# Patient Record
Sex: Female | Born: 1950
Health system: Southern US, Community
[De-identification: ages and names within clinical notes are randomized; demographics above are authoritative.]

## PROBLEM LIST (undated history)

## (undated) DIAGNOSIS — K219 Gastro-esophageal reflux disease without esophagitis: Secondary | ICD-10-CM

## (undated) DIAGNOSIS — E119 Type 2 diabetes mellitus without complications: Secondary | ICD-10-CM

## (undated) DIAGNOSIS — I10 Essential (primary) hypertension: Secondary | ICD-10-CM

## (undated) DIAGNOSIS — M109 Gout, unspecified: Secondary | ICD-10-CM

## (undated) DIAGNOSIS — K649 Unspecified hemorrhoids: Secondary | ICD-10-CM

## (undated) DIAGNOSIS — Z973 Presence of spectacles and contact lenses: Secondary | ICD-10-CM

## (undated) DIAGNOSIS — H9319 Tinnitus, unspecified ear: Secondary | ICD-10-CM

## (undated) DIAGNOSIS — I340 Nonrheumatic mitral (valve) insufficiency: Secondary | ICD-10-CM

## (undated) DIAGNOSIS — R32 Unspecified urinary incontinence: Secondary | ICD-10-CM

## (undated) DIAGNOSIS — I471 Supraventricular tachycardia, unspecified: Secondary | ICD-10-CM

## (undated) DIAGNOSIS — H538 Other visual disturbances: Secondary | ICD-10-CM

## (undated) DIAGNOSIS — J309 Allergic rhinitis, unspecified: Secondary | ICD-10-CM

## (undated) DIAGNOSIS — M1990 Unspecified osteoarthritis, unspecified site: Secondary | ICD-10-CM

## (undated) DIAGNOSIS — F419 Anxiety disorder, unspecified: Secondary | ICD-10-CM

## (undated) DIAGNOSIS — J45909 Unspecified asthma, uncomplicated: Secondary | ICD-10-CM

## (undated) DIAGNOSIS — K432 Incisional hernia without obstruction or gangrene: Secondary | ICD-10-CM

## (undated) DIAGNOSIS — IMO0001 Reserved for inherently not codable concepts without codable children: Secondary | ICD-10-CM

## (undated) DIAGNOSIS — E785 Hyperlipidemia, unspecified: Secondary | ICD-10-CM

## (undated) DIAGNOSIS — T7840XA Allergy, unspecified, initial encounter: Secondary | ICD-10-CM

## (undated) DIAGNOSIS — F32A Depression, unspecified: Secondary | ICD-10-CM

## (undated) DIAGNOSIS — R519 Headache, unspecified: Secondary | ICD-10-CM

## (undated) DIAGNOSIS — I219 Acute myocardial infarction, unspecified: Secondary | ICD-10-CM

## (undated) DIAGNOSIS — L511 Stevens-Johnson syndrome: Secondary | ICD-10-CM

## (undated) DIAGNOSIS — Z8709 Personal history of other diseases of the respiratory system: Secondary | ICD-10-CM

## (undated) DIAGNOSIS — F329 Major depressive disorder, single episode, unspecified: Secondary | ICD-10-CM

## (undated) DIAGNOSIS — R51 Headache: Secondary | ICD-10-CM

## (undated) DIAGNOSIS — R011 Cardiac murmur, unspecified: Secondary | ICD-10-CM

## (undated) DIAGNOSIS — D649 Anemia, unspecified: Secondary | ICD-10-CM

## (undated) DIAGNOSIS — E039 Hypothyroidism, unspecified: Secondary | ICD-10-CM

## (undated) DIAGNOSIS — R002 Palpitations: Secondary | ICD-10-CM

## (undated) DIAGNOSIS — N189 Chronic kidney disease, unspecified: Secondary | ICD-10-CM

## (undated) DIAGNOSIS — Z9289 Personal history of other medical treatment: Secondary | ICD-10-CM

## (undated) DIAGNOSIS — M25473 Effusion, unspecified ankle: Secondary | ICD-10-CM

## (undated) HISTORY — PX: HERNIA REPAIR: SHX51

## (undated) HISTORY — PX: COLON SURGERY: SHX602

## (undated) HISTORY — PX: ABDOMINAL HYSTERECTOMY: SHX81

## (undated) HISTORY — PX: COLONOSCOPY: SHX174

## (undated) HISTORY — PX: BOWEL RESECTION: SHX1257

## (undated) HISTORY — DX: Allergy, unspecified, initial encounter: T78.40XA

## (undated) HISTORY — PX: ESOPHAGOGASTRODUODENOSCOPY: SHX1529

## (undated) HISTORY — DX: Nonrheumatic mitral (valve) insufficiency: I34.0

---

## 1978-06-12 HISTORY — PX: HIATAL HERNIA REPAIR: SHX195

## 1996-06-12 HISTORY — PX: HEMORRHOID SURGERY: SHX153

## 2001-12-25 ENCOUNTER — Ambulatory Visit (HOSPITAL_COMMUNITY): Admission: RE | Admit: 2001-12-25 | Discharge: 2001-12-25 | Payer: Self-pay | Admitting: General Surgery

## 2007-11-07 ENCOUNTER — Encounter (INDEPENDENT_AMBULATORY_CARE_PROVIDER_SITE_OTHER): Payer: Self-pay | Admitting: Diagnostic Radiology

## 2007-11-07 ENCOUNTER — Encounter: Admission: RE | Admit: 2007-11-07 | Discharge: 2007-11-07 | Payer: Self-pay | Admitting: Obstetrics and Gynecology

## 2010-10-28 NOTE — Op Note (Signed)
[facility]  Patient:    [name], [name] Visit Number: [id number] MRN: [id number]          Service Type: END Location: DAY Attending Physician:  Jonathon Bellows Dictated by:   Tana Coast, P.A.-C. Proc. Date: 12/25/01 Admit Date:  12/25/2001 Discharge Date: 12/25/2001   CC:         Justice Britain, M.D.   Operative Report  DATE OF BIRTH:   [birth year]  PROCEDURE:  Esophageal Manometry.  REFERRING PHYSICIAN:  Justice Britain, M.D.  INDICATIONS:  Prior to antireflux surgery, studies per Dr. Catalina Gravel included EGD which revealed no abnormalities.  The patient had reflux symptoms which responded to PPI therapy.  METHOD:  Esophageal manometry study was performed with [employer] gastroesophageal upper gastrointestinal pyelogram, version 6.40, using an Arndorfer infusion system at 0.5 cc per minute.  The lower esophageal sphincter, (LES) was identified in four ports with the standard station pull-through technique.  For esophageal body pressures, the tip of the catheter was placed 3 cm above the proximal aspect of the ileus.  Ten wet swallows were taken with ports 5 cm apart in the esophageal body.  This procedure was reviewed with the patient prior to performing it.  FINDINGS:  Esophageal body contractions (with wet swallows) 0% peristaltic, remainder were simultaneous with 30% low amplitude.  Esophageal body revealed isovaleric contractions with no evidence of peristalsis.  There were low amplitude contractions with no peristalsis suggesting abnormal propagation.  Lower esophageal sphincter:  Locate at 21 to 38 cm (from nares).  Resting pressure 15 mmHg, relaxation 90%, adequate.  IMPRESSION: Based on manometric findings, would be concerned about the possibility of achalasia in resolution.  There was no evidence of peristalsis and were low amplitude contractions suggesting abnormal propagation.  Lower esophageal sphincter pressures  were normal, and the ileus appeared to relax completely, not typical of achalasia.  Given these findings, further workup is recommended prior to antireflux surgery.  Will arrange for the patient to have barium pill esophagogram, hopefully this afternoon.  RECOMMENDATIONS:  Further workup prior to proceeding with antireflux surgery given significant abnormalities seen on esophageal manometry study.  Will arrange for barium pill esophagogram hopefully this afternoon.  These findings were discussed and reviewed with Dr. Jena Gauss.   Findings and recommendations have been discussed with Dr. Gabriel Cirri by Dr. Jena Gauss. Dictated by:   Tana Coast, P.A.-C. Attending Physician:  Jonathon Bellows DD:  12/25/01 TD:  12/30/01 Job: [id number]

## 2014-12-25 ENCOUNTER — Encounter (HOSPITAL_COMMUNITY)
Admission: RE | Admit: 2014-12-25 | Discharge: 2014-12-25 | Disposition: A | Discharge status: Home / Self Care | Payer: BLUE CROSS/BLUE SHIELD | Source: Ambulatory Visit | Attending: Orthopaedic Surgery | Admitting: Orthopaedic Surgery

## 2014-12-25 ENCOUNTER — Encounter (HOSPITAL_COMMUNITY): Payer: Self-pay

## 2014-12-25 ENCOUNTER — Ambulatory Visit (HOSPITAL_COMMUNITY)
Admission: RE | Admit: 2014-12-25 | Discharge: 2014-12-25 | Disposition: A | Discharge status: Home / Self Care | Payer: BLUE CROSS/BLUE SHIELD | Source: Ambulatory Visit | Attending: Orthopedic Surgery | Admitting: Orthopedic Surgery

## 2014-12-25 DIAGNOSIS — M879 Osteonecrosis, unspecified: Secondary | ICD-10-CM | POA: Insufficient documentation

## 2014-12-25 DIAGNOSIS — Z01818 Encounter for other preprocedural examination: Secondary | ICD-10-CM | POA: Diagnosis not present

## 2014-12-25 DIAGNOSIS — Z01812 Encounter for preprocedural laboratory examination: Secondary | ICD-10-CM | POA: Diagnosis not present

## 2014-12-25 DIAGNOSIS — R9431 Abnormal electrocardiogram [ECG] [EKG]: Secondary | ICD-10-CM | POA: Diagnosis not present

## 2014-12-25 DIAGNOSIS — Z0183 Encounter for blood typing: Secondary | ICD-10-CM | POA: Diagnosis not present

## 2014-12-25 HISTORY — DX: Anemia, unspecified: D64.9

## 2014-12-25 HISTORY — DX: Gastro-esophageal reflux disease without esophagitis: K21.9

## 2014-12-25 HISTORY — DX: Anxiety disorder, unspecified: F41.9

## 2014-12-25 HISTORY — DX: Stevens-Johnson syndrome: L51.1

## 2014-12-25 HISTORY — DX: Gout, unspecified: M10.9

## 2014-12-25 HISTORY — DX: Depression, unspecified: F32.A

## 2014-12-25 HISTORY — DX: Reserved for inherently not codable concepts without codable children: IMO0001

## 2014-12-25 HISTORY — DX: Essential (primary) hypertension: I10

## 2014-12-25 HISTORY — DX: Hypothyroidism, unspecified: E03.9

## 2014-12-25 HISTORY — DX: Type 2 diabetes mellitus without complications: E11.9

## 2014-12-25 HISTORY — DX: Chronic kidney disease, unspecified: N18.9

## 2014-12-25 HISTORY — DX: Major depressive disorder, single episode, unspecified: F32.9

## 2014-12-25 HISTORY — DX: Unspecified osteoarthritis, unspecified site: M19.90

## 2014-12-25 LAB — TYPE AND SCREEN
ABO/RH(D): O POS
Antibody Screen: NEGATIVE

## 2014-12-25 LAB — CBC WITH DIFFERENTIAL/PLATELET
BASOS PCT: 0 % (ref 0–1)
Basophils Absolute: 0 10*3/uL (ref 0.0–0.1)
EOS ABS: 0.1 10*3/uL (ref 0.0–0.7)
EOS PCT: 3 % (ref 0–5)
HCT: 35.2 % — ABNORMAL LOW (ref 36.0–46.0)
Hemoglobin: 11.4 g/dL — ABNORMAL LOW (ref 12.0–15.0)
LYMPHS ABS: 1.8 10*3/uL (ref 0.7–4.0)
Lymphocytes Relative: 38 % (ref 12–46)
MCH: 26.5 pg (ref 26.0–34.0)
MCHC: 32.4 g/dL (ref 30.0–36.0)
MCV: 81.7 fL (ref 78.0–100.0)
MONO ABS: 0.4 10*3/uL (ref 0.1–1.0)
Monocytes Relative: 8 % (ref 3–12)
NEUTROS PCT: 51 % (ref 43–77)
Neutro Abs: 2.5 10*3/uL (ref 1.7–7.7)
Platelets: 201 10*3/uL (ref 150–400)
RBC: 4.31 MIL/uL (ref 3.87–5.11)
RDW: 13.8 % (ref 11.5–15.5)
WBC: 4.8 10*3/uL (ref 4.0–10.5)

## 2014-12-25 LAB — PROTIME-INR
INR: 1.03 (ref 0.00–1.49)
PROTHROMBIN TIME: 13.7 s (ref 11.6–15.2)

## 2014-12-25 LAB — SURGICAL PCR SCREEN
MRSA, PCR: NEGATIVE
Staphylococcus aureus: NEGATIVE

## 2014-12-25 LAB — ABO/RH: ABO/RH(D): O POS

## 2014-12-25 LAB — COMPREHENSIVE METABOLIC PANEL
ALT: 12 U/L — ABNORMAL LOW (ref 14–54)
ANION GAP: 8 (ref 5–15)
AST: 20 U/L (ref 15–41)
Albumin: 4.1 g/dL (ref 3.5–5.0)
Alkaline Phosphatase: 98 U/L (ref 38–126)
BILIRUBIN TOTAL: 0.5 mg/dL (ref 0.3–1.2)
BUN: 19 mg/dL (ref 6–20)
CALCIUM: 10.3 mg/dL (ref 8.9–10.3)
CHLORIDE: 107 mmol/L (ref 101–111)
CO2: 25 mmol/L (ref 22–32)
Creatinine, Ser: 1.07 mg/dL — ABNORMAL HIGH (ref 0.44–1.00)
GFR calc Af Amer: >60 mL/min (ref >60)
GFR calc non Af Amer: 54 mL/min — ABNORMAL LOW (ref >60)
Glucose, Bld: 107 mg/dL — ABNORMAL HIGH (ref 65–99)
Potassium: 4.6 mmol/L (ref 3.5–5.1)
SODIUM: 140 mmol/L (ref 135–145)
Total Protein: 7.7 g/dL (ref 6.5–8.1)

## 2014-12-25 LAB — GLUCOSE, CAPILLARY: Glucose-Capillary: 98 mg/dL (ref 65–99)

## 2014-12-25 LAB — APTT: APTT: 33 s (ref 24–37)

## 2014-12-25 NOTE — Pre-Procedure Instructions (Signed)
    Tyne Banta Mcewen  12/25/2014     No Pharmacies Listed   Your procedure is scheduled on 01/05/15.  Report to [facility] Admitting at 530 A.M.  Call this number if you have problems the morning of surgery:  [phone]   Remember:  Do not eat food or drink liquids after midnight.  Take these medicines the morning of surgery with A SIP OF WATER all inhalers,xanax,celaxa,clondine,hydrocodone,synthroid,metoprolol,singular,prilosec   Do not wear jewelry, make-up or nail polish.  Do not wear lotions, powders, or perfumes.  You may wear deodorant.  Do not shave 48 hours prior to surgery.  Men may shave face and neck.  Do not bring valuables to the hospital.  [facility] is not responsible for any belongings or valuables.  Contacts, dentures or bridgework may not be worn into surgery.  Leave your suitcase in the car.  After surgery it may be brought to your room.  For patients admitted to the hospital, discharge time will be determined by your treatment team.  Patients discharged the day of surgery will not be allowed to drive home.   Name and phone number of your driver:    Special instructions:    Please read over the following fact sheets that you were given. Pain Booklet, Coughing and Deep Breathing, Blood Transfusion Information, Total Joint Packet and MRSA Information

## 2014-12-25 NOTE — H&P (Signed)
CHIEF COMPLAINT:  Left hip and groin pain.     HISTORY OF PRESENT ILLNESS:  [name] is a 64 year old African American female who is seen today for evaluation of her left hip and groin pain.  She states that she has had these symptoms for some time and has now gotten to the point where she is having problems with her activities of daily living.  She does have pain with every step as well as nighttime pain.  She is having difficulty with ambulation.  She finds herself again having problems with weightbearing as well as problems with activities of daily living.  The nighttime pain is bothering her even more.  Comes in today for re-evaluation.     PAST MEDICAL HISTORY:  In general, her health is good.    HOSPITALIZATIONS:   1.  In 1986 for total abdominal hysterectomy.   2.  Small bowel resection for a small bowel obstruction, date unknown.  3.  1997 for a hemorrhoidectomy. 4.  Hiatal hernia repair, date unknown.   5.  She was also admitted in 2014 for anemia.     MEDICATIONS:   1.  Norco 10/325, 1 to 2 as needed for pain.   2.  Reglan 10 mg p.o. before meals and nightly.   3.  Pioglitazone 30 mg, 1 tablet daily.  4.  Alprazolam 0.5 mg t.i.d.  5.  Ventolin 2 puffs inhalation daily.  6.  Singulair 10 mg nightly.   7.  Levothyroxine 88 mcg daily.  8.  Fexofenadine 180 mg as needed.  9.  Citalopram 20 mg, 2 tablets daily.   10. Magnesium 400 mg daily.  11. Ipratropium bromide 0.07%, 2 applications to each nostril as needed daily b.i.d. 12. Omeprazole 40 mg daily.  13. Quinapril 40 mg daily.  14. Metoprolol tartrate 100 mg b.i.d. 15. Clonidine 0.2 mg, one-half to 1 tablet b.i.d. 16. Lasix 40 mg, 1 tablet daily.  17. Vitamin D 50,000 units orally twice weekly, which has been stopped.  18. Hydroxyzine 10 mg t.i.d. p.r.n. severe itching.    ALLERGIES:  Sulfa, which causes her itch and rash as well as breathing difficulties; penicillin causes her a rash; allopurinol also causes a rash.  She  states that Percocet causes her constipation with itching.  Vicodin is acceptable.  She does not use aspirin or NSAIDs because of acid reflux.    REVIEW OF SYSTEMS:  A 14-point review of systems reveals glasses for vision.  She does have occasional shortness of breath, especially with exertion.  She has had hypertension since the age of 45.  She does have palpitations noted.  Constipation has been noted also, but uses Reglan for this.  She does have a history of asthma and does use an inhaler and Singulair.  She is a diabetic and it has been more than 10 years.  She does have hypothyroidism since she was age 57.  She also has a history of anemia.  She did have what almost sounded like renal failure about 3 years ago based on medications, but she states that this has resolved.  She does have depression as well as nervous tension.     FAMILY HISTORY:  Positive for a mother who is deceased at age 61 who did have a bleeding dyscrasia.  Also hypertension and diabetes, but questionable diet, leukemia and renal disease.  Father died at age 5, but he had hypertension, but died from metastatic cancer.  Primary is unknown.  Brothers:  She has  one 73 year old, one 63 years old, both diabetic.  One 63 years old.  One 63 years old and is deceased.  She has one sister at age 70 who has hypertension and has a CVA noted.     SOCIAL HISTORY:  [name] is a 64 year old African American single female, retired from [employer].  She denies use of tobacco and alcohol.    PHYSICAL EXAMINATION:   A 64 year old [place] American female, well-developed, well-nourished, alert, cooperative, in moderate distress secondary to left hip and groin pain.  She is 5 feet, 2 inches, weighs 153 pounds with a BMI of 28.   Vital signs:  Temperature 97.7, pulse 72, respirations 16, blood pressure 156/90.  Head is normocephalic.  Eyes:  Pupils equal, round and reactive to light and accommodation with extraocular movements intact.  Ears,  nose and throat were benign.  Chest:  Good expansion. Lungs were essentially clear.  Cardiac:  Regular rhythm and rate with multiple ectopics.  No rubs or gallops are appreciated.  Pulses were 1+, bilateral and symmetric in the lower extremity.  Abdomen:  Obese, soft, nontender.  No masses palpable.  Normal bowel sounds present.   Genital/rectal and breast exam not indicated for an orthopedic evaluation.   CNS:  She appears oriented x3.  Cranial nerves II-XII grossly intact.   Musculoskeletal:  Today I can get her to about 90 to 95 degrees of forward flexion of the left hip.  She has minimal, if any, internal or external rotation, but this is painful with her lying on her back with her leg at 90 degrees of flexion with internal and external rotation.     RADIOGRAPHS:  AVN of the left hip as well as the right hip on a pelvis film.  The left hip, unfortunately though, is collapsed and at least 25 to 30% of the joint is collapsed secondary to the AVN.    CLINICAL IMPRESSION:   1.  AVN of the of the left and right hips, left worse than right, with collapse.  2.  Diabetes, non-insulin-dependent.  3.  Hypothyroidism.  4.  Hypertension.  5.  Palpitations.   6.  Depression.   7.  History of chronic kidney disease.  8.  Anemia, questionable iron-deficient.    RECOMMENDATIONS:   1.  At this time, I have reviewed a form from Dr. Melina Copa and Particia Nearing, PA-C stating that they feel that she would be a candidate for a left total hip arthroplasty.   2.  Therefore, our plan is to proceed with a left total hip arthroplasty.  Procedure, risks and benefits were fully explained to the patient using appropriate models.  All complications were discussed and all her questions were answered.  Therefore, we will proceed with the total hip replacement on the left pending the laboratory studies from the hospital preoperatively.    Mike Craze [place], [place] [phone]  12/28/2014 3:55  PM

## 2014-12-26 LAB — HEMOGLOBIN A1C
HEMOGLOBIN A1C: 6 % — AB (ref 4.8–5.6)
Mean Plasma Glucose: 126 mg/dL

## 2015-01-04 ENCOUNTER — Encounter (HOSPITAL_COMMUNITY): Payer: Self-pay | Admitting: Anesthesiology

## 2015-01-05 ENCOUNTER — Other Ambulatory Visit: Payer: Self-pay | Admitting: Cardiology

## 2015-01-05 ENCOUNTER — Encounter (HOSPITAL_COMMUNITY)
Admission: RE | Disposition: A | Discharge status: Home / Self Care | Payer: Self-pay | Source: Ambulatory Visit | Attending: Orthopaedic Surgery

## 2015-01-05 ENCOUNTER — Telehealth (HOSPITAL_COMMUNITY): Payer: Self-pay | Admitting: *Deleted

## 2015-01-05 ENCOUNTER — Ambulatory Visit (HOSPITAL_COMMUNITY)
Admission: RE | Admit: 2015-01-05 | Discharge: 2015-01-05 | DRG: 554 | Disposition: A | Discharge status: Home / Self Care | Payer: BLUE CROSS/BLUE SHIELD | Source: Ambulatory Visit | Attending: Orthopaedic Surgery | Admitting: Orthopaedic Surgery

## 2015-01-05 ENCOUNTER — Encounter (HOSPITAL_COMMUNITY): Payer: Self-pay | Admitting: *Deleted

## 2015-01-05 DIAGNOSIS — E119 Type 2 diabetes mellitus without complications: Secondary | ICD-10-CM

## 2015-01-05 DIAGNOSIS — K219 Gastro-esophageal reflux disease without esophagitis: Secondary | ICD-10-CM | POA: Diagnosis present

## 2015-01-05 DIAGNOSIS — I129 Hypertensive chronic kidney disease with stage 1 through stage 4 chronic kidney disease, or unspecified chronic kidney disease: Secondary | ICD-10-CM | POA: Diagnosis present

## 2015-01-05 DIAGNOSIS — I10 Essential (primary) hypertension: Secondary | ICD-10-CM

## 2015-01-05 DIAGNOSIS — R002 Palpitations: Secondary | ICD-10-CM | POA: Diagnosis not present

## 2015-01-05 DIAGNOSIS — E039 Hypothyroidism, unspecified: Secondary | ICD-10-CM | POA: Diagnosis present

## 2015-01-05 DIAGNOSIS — Z01810 Encounter for preprocedural cardiovascular examination: Secondary | ICD-10-CM

## 2015-01-05 DIAGNOSIS — M109 Gout, unspecified: Secondary | ICD-10-CM | POA: Diagnosis present

## 2015-01-05 DIAGNOSIS — M1990 Unspecified osteoarthritis, unspecified site: Secondary | ICD-10-CM | POA: Diagnosis present

## 2015-01-05 DIAGNOSIS — N189 Chronic kidney disease, unspecified: Secondary | ICD-10-CM | POA: Diagnosis present

## 2015-01-05 DIAGNOSIS — F419 Anxiety disorder, unspecified: Secondary | ICD-10-CM | POA: Diagnosis not present

## 2015-01-05 DIAGNOSIS — Z888 Allergy status to other drugs, medicaments and biological substances status: Secondary | ICD-10-CM

## 2015-01-05 DIAGNOSIS — J45909 Unspecified asthma, uncomplicated: Secondary | ICD-10-CM | POA: Diagnosis present

## 2015-01-05 DIAGNOSIS — R079 Chest pain, unspecified: Secondary | ICD-10-CM | POA: Diagnosis present

## 2015-01-05 DIAGNOSIS — F329 Major depressive disorder, single episode, unspecified: Secondary | ICD-10-CM | POA: Diagnosis not present

## 2015-01-05 DIAGNOSIS — Z880 Allergy status to penicillin: Secondary | ICD-10-CM

## 2015-01-05 DIAGNOSIS — M879 Osteonecrosis, unspecified: Secondary | ICD-10-CM | POA: Diagnosis not present

## 2015-01-05 DIAGNOSIS — D509 Iron deficiency anemia, unspecified: Secondary | ICD-10-CM | POA: Diagnosis not present

## 2015-01-05 DIAGNOSIS — Z882 Allergy status to sulfonamides status: Secondary | ICD-10-CM | POA: Diagnosis not present

## 2015-01-05 DIAGNOSIS — R0789 Other chest pain: Principal | ICD-10-CM

## 2015-01-05 DIAGNOSIS — Z79899 Other long term (current) drug therapy: Secondary | ICD-10-CM | POA: Diagnosis not present

## 2015-01-05 DIAGNOSIS — M25552 Pain in left hip: Secondary | ICD-10-CM | POA: Diagnosis present

## 2015-01-05 LAB — GLUCOSE, CAPILLARY: GLUCOSE-CAPILLARY: 115 mg/dL — AB (ref 65–99)

## 2015-01-05 SURGERY — ARTHROPLASTY, HIP, TOTAL,POSTERIOR APPROACH
Anesthesia: General | Site: Hip | Laterality: Left

## 2015-01-05 MED ORDER — CHLORHEXIDINE GLUCONATE 4 % EX LIQD: 60.0000 mL | Freq: Once | CUTANEOUS | Status: DC

## 2015-01-05 MED ORDER — PROPOFOL 10 MG/ML IV BOLUS
INTRAVENOUS | Status: AC
  Filled 2015-01-05: qty 20

## 2015-01-05 MED ORDER — ACETAMINOPHEN 10 MG/ML IV SOLN: 1000.0000 mg | Freq: Once | INTRAVENOUS | Status: DC

## 2015-01-05 MED ORDER — SODIUM CHLORIDE 0.9 % IV SOLN: INTRAVENOUS | Status: DC

## 2015-01-05 MED ORDER — FENTANYL CITRATE (PF) 250 MCG/5ML IJ SOLN
INTRAMUSCULAR | Status: AC
  Filled 2015-01-05: qty 5

## 2015-01-05 MED ORDER — CEFAZOLIN SODIUM-DEXTROSE 2-3 GM-% IV SOLR: 2.0000 g | INTRAVENOUS | Status: DC

## 2015-01-05 MED ORDER — CEFAZOLIN SODIUM-DEXTROSE 2-3 GM-% IV SOLR
INTRAVENOUS | Status: AC
  Filled 2015-01-05: qty 50

## 2015-01-05 MED ORDER — MIDAZOLAM HCL 2 MG/2ML IJ SOLN
INTRAMUSCULAR | Status: AC
  Filled 2015-01-05: qty 2

## 2015-01-05 NOTE — Anesthesia Preprocedure Evaluation (Deleted)
Anesthesia Evaluation    Reviewed: Allergy & Precautions, NPO status , Patient's Chart, lab work & pertinent test results  History of Anesthesia Complications Negative for: history of anesthetic complications  Airway        Dental   Pulmonary neg pulmonary ROS,          Cardiovascular hypertension,     Neuro/Psych PSYCHIATRIC DISORDERS Anxiety Depression negative neurological ROS     GI/Hepatic Neg liver ROS, GERD-  ,  Endo/Other  diabetesHypothyroidism   Renal/GU      Musculoskeletal   Abdominal   Peds  Hematology   Anesthesia Other Findings   Reproductive/Obstetrics                            Anesthesia Physical Anesthesia Plan  ASA: II  Anesthesia Plan:    Post-op Pain Management:    Induction:   Airway Management Planned:   Additional Equipment:   Intra-op Plan:   Post-operative Plan:   Informed Consent:   Plan Discussed with:   Anesthesia Plan Comments:         Anesthesia Quick Evaluation

## 2015-01-05 NOTE — Consult Note (Signed)
Cardiology Consultation     Patient ID: [name] [id number], [birth year] Admit date: 09/27/2014 Date of Consult: 09/28/2014  Primary Physician: Particia Nearing, [facility] Primary Cardiologist: new Referring Physician: Dr. Durward Fortes (ortho) and Dr. Tobias Alexander (anesthesia)  Chief Complaint: chest pain Reason for Consultation:  [place] cardiology consultation  HPI:   [name] is a 64 year old African-American female who admits to at least a 76 year history of hypertension, as well as a least a four-year history of type 2 diabetes mellitus.  She is seen in the preoperative suite prior to planned left hip surgery which was scheduled for today.  When she was evaluated by Dr. Tobias Alexander of anesthesiology, she reported a history of chest pain and ECG was felt to be abnormal.  Her elective surgery was canceled for today and Cardiology consultation was requested.  [name] has not been very active due to her orthopedic issues.  However, upon questioning, she admits to experiencing episodes of intermittent chest tightness associated with some shortness of breath.  At times these can occur any time, but she also has noticed it with attempted ambulation.  She admits that she has had some issues with anxiety.  She also has noticed palpitations intermittently.  She denies any episodes of presyncope or syncope.  She denies PND, orthopnea.  She has been on quinapril 40 mg daily, metoprolol tartrate 100 mg twice a day, clonidine 0.2 mg twice a day, and Lasix.  She has noticed intermittent ankle swelling.  She also has a history of asthma and has been on Singulair and Ventolin.  She states her last episode of chest pain was several days ago.  There was some question of experiencing mild chest discomfort earlier today.  Presently she is chest pain-free.   Past Medical History  Diagnosis Date  . Hypertension   . Shortness of breath dyspnea   . Gout   . Diabetes mellitus without complication   . Depression   . Anxiety    . Chronic Bright disease   . Hypothyroidism   . Anemia   . GERD (gastroesophageal reflux disease)   . Dysrhythmia   . Arthritis   . Stevens-Johnson disease    Past Surgical History  Procedure Laterality Date  . Abdominal hysterectomy    . Hernia repair    . Colon surgery      FAMHx:  Positive for a mother who is deceased at age 60 who did have a bleeding dyscrasia. Also hypertension and diabetes, leukemia and renal disease. Father died at age 46, but he had hypertension, but died from metastatic cancer. Brothers: She has one 41 year old, one 22 years old, both diabetic. One 63 years old. One 63 years old and is deceased. She has one sister at age 54 who has hypertension and CVA.    SOCHx:  African American single female, retired from [employer]. She denies use of tobacco and alcohol. Not able to exercise due to hip.    ALLERGIES: Allergies  Allergen Reactions  . Other Itching and Other (See Comments)    Acidic foods cause stomach upset and itching  . Allopurinol Rash  . Penicillins Rash  . Sulfa Antibiotics Rash     HOME MEDICATIONS: Prescriptions prior to admission  Medication Sig Dispense Refill Last Dose  . albuterol (PROVENTIL HFA;VENTOLIN HFA) 108 (90 BASE) MCG/ACT inhaler Inhale 2 puffs into the lungs 2 (two) times daily as needed for wheezing or shortness of breath.   01/05/2015 at 0345  . ALPRAZolam (XANAX) 0.5  MG tablet Take 0.5 mg by mouth 3 (three) times daily.  5 01/05/2015 at 0330  . Ascorbic Acid (VITAMIN C PO) Take 1 tablet by mouth daily.   01/04/2015 at Unknown time  . citalopram (CELEXA) 20 MG tablet Take 20 mg by mouth 2 (two) times daily.   12 01/05/2015 at 0330  . cloNIDine (CATAPRES) 0.2 MG tablet Take 0.1 mg by mouth at bedtime.   01/05/2015 at 0330  . fexofenadine (ALLEGRA) 180 MG tablet Take 180 mg by mouth daily as needed for allergies or rhinitis.   Past Week at Unknown time  . fluticasone (FLONASE ALLERGY RELIEF) 50 MCG/ACT  nasal spray Place 1 spray into both nostrils daily as needed for allergies or rhinitis.     . furosemide (LASIX) 40 MG tablet Take 40 mg by mouth daily.  12 Past Week at Unknown time  . HYDROcodone-acetaminophen (NORCO) 10-325 MG per tablet Take 1-2 tablets by mouth every 6 (six) hours as needed (pain).     . hydrOXYzine (ATARAX/VISTARIL) 10 MG tablet Take 10 mg by mouth every 6 (six) hours as needed (itching from hydrocodone/apap).   01/05/2015 at 0300  . levothyroxine (SYNTHROID, LEVOTHROID) 88 MCG tablet Take 88 mcg by mouth daily.  12 01/05/2015 at 0330  . Magnesium 400 MG TABS Take 400 mg by mouth daily.   Past Week at Unknown time  . metoCLOPramide (REGLAN) 10 MG tablet Take 10 mg by mouth 4 (four) times daily -  before meals and at bedtime.   Past Week at Unknown time  . metoprolol (LOPRESSOR) 100 MG tablet Take 100 mg by mouth 2 (two) times daily.  12 01/05/2015 at 0330  . montelukast (SINGULAIR) 10 MG tablet Take 10 mg by mouth at bedtime.  12 01/04/2015 at 0330  . Multiple Vitamin (MULTIVITAMIN WITH MINERALS) TABS tablet Take 1 tablet by mouth daily with lunch. Alive   01/04/2015 at Unknown time  . Omega-3 Fatty Acids (FISH OIL PO) Take 1 capsule by mouth daily.   Past Week at Unknown time  . omeprazole (PRILOSEC OTC) 20 MG tablet Take 20 mg by mouth 2 (two) times daily as needed (acid reflux/ heartburn).   01/05/2015 at 0245  . pioglitazone (ACTOS) 30 MG tablet Take 30 mg by mouth daily.  6 01/04/2015 at Unknown time  . Polyethyl Glycol-Propyl Glycol (SYSTANE OP) Place 1 drop into both eyes daily as needed (dry eyes).   Past Month at Unknown time  . POTASSIUM PO Take 1 tablet by mouth daily.   01/04/2015 at Unknown time  . quinapril (ACCUPRIL) 40 MG tablet Take 40 mg by mouth daily.  6 01/04/2015 at Unknown time  . Vitamin D, Ergocalciferol, (DRISDOL) 50000 UNITS CAPS capsule Take 50,000 Units by mouth 2 (two) times a week. Tuesday and Thursday  12 Past Week at Unknown time    HOSPITAL  MEDICATIONS: . acetaminophen  1,000 mg Intravenous Once  . ceFAZolin      .  ceFAZolin (ANCEF) IV  2 g Intravenous On Call to OR  . chlorhexidine  60 mL Topical Once  . chlorhexidine  60 mL Topical Once    ROS General: Negative; No fevers, chills, or night sweats;  HEENT: Negative; No changes in vision or hearing, sinus congestion, difficulty swallowing Pulmonary: Negative; No cough, wheezing, shortness of breath, hemoptysis Cardiovascular:  See HPI GI: positive for GERD GU: Negative; No dysuria, hematuria, or difficulty voiding Musculoskeletal:  Hematologic/Oncology: Positive for iron deficiency anemia Endocrine: Positive for diabetes and  hypothyroidism Neuro: Negative; no changes in balance, headaches Skin: Negative; No rashes or skin lesions Psychiatric: History of depression and anxiety Sleep: Negative; No snoring, daytime sleepiness, hypersomnolence, bruxism, restless legs, hypnogognic hallucinations, no cataplexy Other comprehensive 14 point system review is negative.  VITALS: Blood pressure 109/48, pulse 65, temperature 97.5 F (36.4 C), resp. rate 18, height 5' 2" (1.575 m), weight 161 lb (73.029 kg), SpO2 100 %.  PHYSICAL EXAM:  BP 109/48 mmHg  Pulse 65  Temp(Src) 97.5 F (36.4 C)  Resp 18  Ht 5' 2" (1.575 m)  Wt 161 lb (73.029 kg)  BMI 29.44 kg/m2  SpO2 100%   General: Alert, oriented, no distress.  Skin: normal turgor, no rashes, warm and dry HEENT: Normocephalic, atraumatic. Pupils equal round and reactive to light; sclera anicteric; extraocular muscles intact;  Nose without nasal septal hypertrophy Mouth/Parynx benign; Mallinpatti scale 3 Neck: No JVD, no carotid bruits; normal carotid upstroke Lungs: clear to ausculatation and percussion; no wheezing or rales Chest wall: without tenderness to palpitation Heart: PMI not displaced, RRR, s1 s2 normal, 1/6 systolic murmur, no diastolic murmur, no rubs, gallops, thrills, or heaves Abdomen: Midline abdominal  scar;soft, nontender; no hepatosplenomehaly, BS+; abdominal aorta nontender and not dilated by palpation. Back: no CVA tenderness Pulses 2+ Musculoskeletal: full range of motion, normal strength, no joint deformities Extremities: Trivial ankle edema, no clubbing cyanosis, Homan's sign negative  Neurologic: grossly nonfocal; Cranial nerves grossly wnl Psychologic: Mildly sedated   ECG (independently read by me):   12/25/2014: Sinus bradycardia 55 bpm.  Poor R wave progression V1 through V3, nondiagnostic T change in lead 3 and in lead V3  01/05/2015: Normal sinus rhythm at 60.  QRS complex V1 V2.  Nondiagnostic T change in lead 3.  LABS:  Results for orders placed or performed during the hospital encounter of 01/05/15 (from the past 48 hour(s))  Glucose, capillary     Status: Abnormal   Collection Time: 01/05/15  6:30 AM  Result Value Ref Range   Glucose-Capillary 115 (H) 65 - 99 mg/dL   BMET    Component Value Date/Time   NA 140 12/25/2014 1350   K 4.6 12/25/2014 1350   CL 107 12/25/2014 1350   CO2 25 12/25/2014 1350   GLUCOSE 107* 12/25/2014 1350   BUN 19 12/25/2014 1350   CREATININE 1.07* 12/25/2014 1350   CALCIUM 10.3 12/25/2014 1350   GFRNONAA 54* 12/25/2014 1350   GFRAA >60 12/25/2014 1350   Hepatic Function Latest Ref Rng 12/25/2014  Total Protein 6.5 - 8.1 g/dL 7.7  Albumin 3.5 - 5.0 g/dL 4.1  AST 15 - 41 U/L 20  ALT 14 - 54 U/L 12(L)  Alk Phosphatase 38 - 126 U/L 98  Total Bilirubin 0.3 - 1.2 mg/dL 0.5   CBC Latest Ref Rng 12/25/2014  WBC 4.0 - 10.5 K/uL 4.8  Hemoglobin 12.0 - 15.0 g/dL 11.4(L)  Hematocrit 36.0 - 46.0 % 35.2(L)  Platelets 150 - 400 K/uL 201   Lab Results  Component Value Date   MCV 81.7 12/25/2014   Lab Results  Component Value Date   HGBA1C 6.0* 12/25/2014   Lipid Panel  No results found for: CHOL, TRIG, HDL, CHOLHDL, VLDL, LDLCALC, LDLDIRECT   IMAGING: No results found.  IMPRESSION: [name] is a 64 year old  African-American female who has a cardiac risk factor profile notable for a long-standing history of hypertension, a history of type 2 diabetes mellitus, and family history for heart disease.  She has not been  able to be very active due to her orthopedic issues.  She has noticed episodes of chest pain which most time is nonexertional.  She describes this as a pressure and has gone away when she has taken her blood pressure medication.  She has noticed associated shortness of breath at times with this discomfort.  She also has a history of palpitations. Her blood pressure today is stable on her current medical regimen.  Her ECG  is not definitive but has shown poor anterior R-wave progression and nonspecific T changes.  With her cardiovascular comorbidities, I agree with canceling her elective surgery today and pursue initial noninvasive evaluation of her cardiovascular disease.  I will schedule her for a 2-D echo Doppler study as well as a Lexiscan nuclear perfusion stress test to assess both systolic and diastolic function, valvular architecture, as well as potential ischemic etiology to her chest discomfort.  If these studies are low risk and do not demonstrate significant ischemia she will be given clearance for her elective surgery.  However, if significant ischemia is demonstrated on her nuclear study further Cardiologic evaluation may be necessary prior to planned elective hip replacement surgery.  Outpatient laboratories should include a fasting lipid panel in this patient with diabetes mellitus with a target LDL less than 70.  We will also give her a prescription for sublingual nitroglycerin to see if this improves chest discomfort.   Attending:  Troy Sine, MD, [facility] 01/05/2015 8:47 AM

## 2015-01-05 NOTE — Progress Notes (Addendum)
Cardiology [phone])and informed of pt having chest pain and EKG changes.  Dr SInger saw pt and requested cardio eval, due to patient having chest pain. EKG completed.  Pt placed on tele and O2 applied.

## 2015-01-05 NOTE — Telephone Encounter (Signed)
Patient given detailed instructions per Myocardial Perfusion Study Information Sheet for test on 01/08/15 at 0945. Patient Notified to arrive 15 minutes early, and that it is imperative to arrive on time for appointment to keep from having the test rescheduled. Patient verbalized understanding. Ashleigh Arya, [place] [place]

## 2015-01-05 NOTE — Progress Notes (Signed)
Dr Claiborne Billings in to see patient states that it will be ok to removed her IV. Iv removed and PT instructed to call when she is dressed.

## 2015-01-05 NOTE — Progress Notes (Signed)
Trish called and informed of pt having chest pain and EKG changes. States she will get someone over.

## 2015-01-08 ENCOUNTER — Other Ambulatory Visit: Payer: Self-pay

## 2015-01-08 ENCOUNTER — Ambulatory Visit (HOSPITAL_BASED_OUTPATIENT_CLINIC_OR_DEPARTMENT_OTHER): Payer: BLUE CROSS/BLUE SHIELD

## 2015-01-08 ENCOUNTER — Ambulatory Visit (HOSPITAL_COMMUNITY): Payer: BLUE CROSS/BLUE SHIELD | Attending: Cardiology

## 2015-01-08 VITALS — Ht 62.0 in | Wt 161.0 lb

## 2015-01-08 DIAGNOSIS — I340 Nonrheumatic mitral (valve) insufficiency: Secondary | ICD-10-CM | POA: Diagnosis not present

## 2015-01-08 DIAGNOSIS — R42 Dizziness and giddiness: Secondary | ICD-10-CM | POA: Diagnosis not present

## 2015-01-08 DIAGNOSIS — R600 Localized edema: Secondary | ICD-10-CM | POA: Insufficient documentation

## 2015-01-08 DIAGNOSIS — R110 Nausea: Secondary | ICD-10-CM

## 2015-01-08 DIAGNOSIS — R0789 Other chest pain: Secondary | ICD-10-CM | POA: Insufficient documentation

## 2015-01-08 DIAGNOSIS — E119 Type 2 diabetes mellitus without complications: Secondary | ICD-10-CM | POA: Insufficient documentation

## 2015-01-08 DIAGNOSIS — R079 Chest pain, unspecified: Secondary | ICD-10-CM

## 2015-01-08 DIAGNOSIS — I10 Essential (primary) hypertension: Secondary | ICD-10-CM | POA: Diagnosis not present

## 2015-01-08 DIAGNOSIS — R0609 Other forms of dyspnea: Secondary | ICD-10-CM | POA: Insufficient documentation

## 2015-01-08 LAB — MYOCARDIAL PERFUSION IMAGING
LV dias vol: 76 mL
LV sys vol: 20 mL
Peak HR: 101 {beats}/min
RATE: 0.3
Rest HR: 57 {beats}/min
SDS: 2
SRS: 0
SSS: 2
TID: 0.94

## 2015-01-08 MED ORDER — REGADENOSON 0.4 MG/5ML IV SOLN
0.4000 mg | Freq: Once | INTRAVENOUS | Status: AC
  Administered 2015-01-08: 0.4 mg via INTRAVENOUS

## 2015-01-08 MED ORDER — TECHNETIUM TC 99M SESTAMIBI GENERIC - CARDIOLITE
30.3000 | Freq: Once | INTRAVENOUS | Status: AC | PRN
  Administered 2015-01-08: 30.3 via INTRAVENOUS

## 2015-01-08 MED ORDER — TECHNETIUM TC 99M SESTAMIBI GENERIC - CARDIOLITE
10.3000 | Freq: Once | INTRAVENOUS | Status: AC | PRN
  Administered 2015-01-08: 10 via INTRAVENOUS

## 2015-01-08 MED ORDER — AMINOPHYLLINE 25 MG/ML IV SOLN
75.0000 mg | Freq: Once | INTRAVENOUS | Status: AC
Start: 2015-01-08 — End: 2015-01-08
  Administered 2015-01-08: 75 mg via INTRAVENOUS

## 2015-01-13 ENCOUNTER — Ambulatory Visit (INDEPENDENT_AMBULATORY_CARE_PROVIDER_SITE_OTHER): Payer: BLUE CROSS/BLUE SHIELD | Admitting: Nurse Practitioner

## 2015-01-13 ENCOUNTER — Encounter: Payer: Self-pay | Admitting: Nurse Practitioner

## 2015-01-13 VITALS — BP 150/80 | HR 60 | Ht 63.0 in | Wt 170.0 lb

## 2015-01-13 DIAGNOSIS — R079 Chest pain, unspecified: Secondary | ICD-10-CM | POA: Insufficient documentation

## 2015-01-13 DIAGNOSIS — I340 Nonrheumatic mitral (valve) insufficiency: Secondary | ICD-10-CM | POA: Diagnosis not present

## 2015-01-13 DIAGNOSIS — I10 Essential (primary) hypertension: Secondary | ICD-10-CM | POA: Diagnosis not present

## 2015-01-13 DIAGNOSIS — E119 Type 2 diabetes mellitus without complications: Secondary | ICD-10-CM | POA: Insufficient documentation

## 2015-01-13 DIAGNOSIS — E1165 Type 2 diabetes mellitus with hyperglycemia: Secondary | ICD-10-CM | POA: Insufficient documentation

## 2015-01-13 NOTE — Patient Instructions (Signed)
Medication Instructions:  Your physician recommends that you continue on your current medications as directed. Please refer to the Current Medication list given to you today.   Labwork: None   Testing/Procedures: None   Follow-Up: Your physician wants you to follow-up in: 6 months with Dr.Kelly You will receive a reminder letter in the mail two months in advance. If you don't receive a letter, please call our office to schedule the follow-up appointment.   Any Other Special Instructions Will Be Listed Below (If Applicable). You have been cleared for your upcoming surgery with Dr.Whitfield

## 2015-01-13 NOTE — Progress Notes (Signed)
Patient Name: [name] Date of Encounter: 01/13/2015  Primary Care Provider:  Terald Sleeper, PA-C Primary Cardiologist:  Corky Downs, MD   Chief Complaint  64 year old female without prior cardiac history who presents for follow-up for chest pain after recent testing.  Past Medical History   Past Medical History  Diagnosis Date  . Hypertension   . Shortness of breath dyspnea   . Gout   . Diabetes mellitus without complication   . Depression   . Anxiety   . Chronic kidney disease   . Hypothyroidism   . Anemia   . GERD (gastroesophageal reflux disease)   . Arthritis   . Stevens-Johnson disease   . Exertional chest pain     a. 12/2014 Lexi MV: EF 73%, no ischemia.  . Moderate mitral insufficiency     a. 12/2014 Echo: EF 55-60%, mod MR, mildly to moderately dil LA, PASP 17mmHg.   Past Surgical History  Procedure Laterality Date  . Abdominal hysterectomy    . Hernia repair    . Colon surgery      Allergies  Allergies  Allergen Reactions  . Other Itching and Other (See Comments)    Acidic foods cause stomach upset and itching  . Allopurinol Rash  . Penicillins Rash  . Sulfa Antibiotics Rash    HPI  64 year old female without a prior cardiac history. She does have a history of hypertension. She was recently evaluated by orthopedics secondary to bilateral hip pain and diagnosed with avascular necrosis which is worse on the right. She was scheduled for total hip arthroplasty in late June but reported chest pain and dyspnea on exertion and the case was canceled and cardiology was consulted. She was seen by Dr. Claiborne Billings with recommendation for canceling the procedure and outpatient echo and stress testing. These have since been performed on July 29. Echo showed normal LV function with moderate mitral regurgitation. Stress testing was negative for ischemia with normal LV function. She has not had significant chest pain or dyspnea since cancellation of her procedure. She does  have a fairly long history however, of exertional substernal chest pressure and dyspnea at higher levels of activity. Her activity is predominantly limited at this point secondary to bilateral hip pain. She denies any history of PND, orthopnea, dizziness, syncope. She does have intermittent lower extremity edema which she believes is related to sitting most of the day.  Home Medications  Prior to Admission medications   Medication Sig Start Date End Date Taking? Authorizing Provider  albuterol (PROVENTIL HFA;VENTOLIN HFA) 108 (90 BASE) MCG/ACT inhaler Inhale 2 puffs into the lungs 2 (two) times daily as needed for wheezing or shortness of breath.   Yes Historical Provider, MD  ALPRAZolam Duanne Moron) 0.5 MG tablet Take 0.5 mg by mouth 3 (three) times daily. 12/01/14  Yes Historical Provider, MD  Ascorbic Acid (VITAMIN C PO) Take 1 tablet by mouth daily.   Yes Historical Provider, MD  aspirin 81 MG tablet Take 81 mg by mouth daily.   Yes Historical Provider, MD  citalopram (CELEXA) 20 MG tablet Take 20 mg by mouth 2 (two) times daily.  12/01/14  Yes Historical Provider, MD  cloNIDine (CATAPRES) 0.2 MG tablet Take 0.2 mg by mouth. Take 1/2 tablet twice daily   Yes Historical Provider, MD  fexofenadine (ALLEGRA) 180 MG tablet Take 180 mg by mouth daily as needed for allergies or rhinitis.   Yes Historical Provider, MD  furosemide (LASIX) 40 MG tablet Take 40 mg by  mouth daily. 10/15/14  Yes Historical Provider, MD  HYDROcodone-acetaminophen (NORCO) 10-325 MG per tablet Take 1-2 tablets by mouth every 6 (six) hours as needed (pain).   Yes Historical Provider, MD  hydrOXYzine (ATARAX/VISTARIL) 10 MG tablet Take 10 mg by mouth every 6 (six) hours as needed (itching from hydrocodone/apap).   Yes Historical Provider, MD  levothyroxine (SYNTHROID, LEVOTHROID) 88 MCG tablet Take 88 mcg by mouth daily. 12/10/14  Yes Historical Provider, MD  Magnesium 400 MG TABS Take 400 mg by mouth daily.   Yes Historical Provider, MD    metoCLOPramide (REGLAN) 10 MG tablet Take 10 mg by mouth 4 (four) times daily -  before meals and at bedtime.   Yes Historical Provider, MD  metoprolol (LOPRESSOR) 100 MG tablet Take 100 mg by mouth 2 (two) times daily. 12/10/14  Yes Historical Provider, MD  montelukast (SINGULAIR) 10 MG tablet Take 10 mg by mouth at bedtime. 12/01/14  Yes Historical Provider, MD  Multiple Vitamin (MULTIVITAMIN WITH MINERALS) TABS tablet Take 1 tablet by mouth daily with lunch. Alive   Yes Historical Provider, MD  Omega-3 Fatty Acids (FISH OIL PO) Take 1 capsule by mouth daily.   Yes Historical Provider, MD  omeprazole (PRILOSEC OTC) 20 MG tablet Take 20 mg by mouth 2 (two) times daily as needed (acid reflux/ heartburn).   Yes Historical Provider, MD  pioglitazone (ACTOS) 30 MG tablet Take 30 mg by mouth daily. 12/01/14  Yes Historical Provider, MD  Polyethyl Glycol-Propyl Glycol (SYSTANE OP) Place 1 drop into both eyes daily as needed (dry eyes).   Yes Historical Provider, MD  POTASSIUM PO Take 1 tablet by mouth daily.   Yes Historical Provider, MD  quinapril (ACCUPRIL) 40 MG tablet Take 40 mg by mouth daily. 11/30/14  Yes Historical Provider, MD  Vitamin D, Ergocalciferol, (DRISDOL) 50000 UNITS CAPS capsule Take 50,000 Units by mouth 2 (two) times a week. Tuesday and Thursday 12/10/14  Yes Historical Provider, MD    Review of Systems  As above, she does experience dyspnea on exertion and chest pressure with higher levels of activity. She denies PND, orthopnea, dizziness, CB, edema, or early satiety.  She does have bilateral hip pain which limits her activities.  All other systems reviewed and are otherwise negative except as noted above.  Physical Exam  VS:  BP 150/80 mmHg  Pulse 60  Ht 5\' 3"  (1.6 m)  Wt 170 lb (77.111 kg)  BMI 30.12 kg/m2 , BMI Body mass index is 30.12 kg/(m^2). GEN: Well nourished, well developed, in no acute distress. HEENT: normal. Neck: Supple, no JVD, carotid bruits, or  masses. Cardiac: RRR, no murmurs, rubs, or gallops. No clubbing, cyanosis, edema.  Radials/DP/PT 2+ and equal bilaterally.  Respiratory:  Respirations regular and unlabored, clear to auscultation bilaterally. GI: Soft, nontender, nondistended, BS + x 4. MS: no deformity or atrophy. Skin: warm and dry, no rash. Neuro:  Strength and sensation are intact. Psych: Normal affect.  Accessory Clinical Findings  ECG - ECG from July 26 shows sinus bradycardia, 59, nonspecific T changes.  Assessment & Plan  1.  Exertional chest discomfort and dyspnea exertion: Patient was recently scheduled for right total hip arthroplasty this was canceled secondary to symptoms of exertional chest discomfort and dyspnea on higher levels of exertion. She has now undergone stress testing and echocardiogram which have shown normal LV function without evidence of ischemia. She does have moderate mitral regurgitation by echo. I have discussed her case with Dr. Caryl Comes. We recommend continuation  of beta blocker therapy throughout the perioperative period and close monitoring of intake and output postoperatively to avoid volume overload. She may proceed to the OR without further evaluation at this time.  2. Moderate mitral regurgitation: This was noted on echocardiogram recently. This may be playing a role in her exertional dyspnea, though LV function is normal. Volume status is stable today. She will need to have close monitoring of I's and O's throughout perioperative period along with continuation of beta blocker therapy.  Follow-up echo in 1 year.  3. Essential hypertension: Blood pressure is elevated today. I recommended that she follow her blood pressure at home and notify us if he continues to run high. She is on beta blocker, clonidine, and diaphoretic therapy.  4. Disposition: Follow-up with Dr. Claiborne Billings in 6 months or sooner if necessary.  Murray Hodgkins, NP 01/13/2015, 5:10 PM

## 2015-01-18 NOTE — H&P (Addendum)
CHIEF COMPLAINT:  Left hip and groin pain.     HISTORY OF PRESENT ILLNESS:  [name] is a 64 year old African American female who is seen today for evaluation of her left hip and groin pain.  She states that she has had these symptoms for some time and has now gotten to the point where she is having problems with her activities of daily living.  She does have pain with every step as well as nighttime pain.  She is having difficulty with ambulation.  She finds herself again having problems with weightbearing as well as problems with activities of daily living.  The nighttime pain is bothering her even more.  Comes in today for re-evaluation.     PAST MEDICAL HISTORY:  In general, her health is good.    HOSPITALIZATIONS:   1.  In 1986 for total abdominal hysterectomy.   2.  Small bowel resection for a small bowel obstruction, date unknown.  3.  1997 for a hemorrhoidectomy. 4.  Hiatal hernia repair, date unknown.   5.  She was also admitted in 2014 for anemia.     MEDICATIONS:   1.  Norco 10/325, 1 to 2 as needed for pain.   2.  Reglan 10 mg p.o. before meals and nightly.   3.  Pioglitazone 30 mg, 1 tablet daily.  4.  Alprazolam 0.5 mg t.i.d.  5.  Ventolin 2 puffs inhalation daily.  6.  Singulair 10 mg nightly.   7.  Levothyroxine 88 mcg daily.  8.  Fexofenadine 180 mg as needed.  9.  Citalopram 20 mg, 2 tablets daily.   10. Magnesium 400 mg daily.  11. Ipratropium bromide 2.83%, 2 applications to each nostril as needed daily b.i.d. 12. Omeprazole 40 mg daily.  13. Quinapril 40 mg daily.  14. Metoprolol tartrate 100 mg b.i.d. 15. Clonidine 0.2 mg, one-half to 1 tablet b.i.d. 16. Lasix 40 mg, 1 tablet daily.  17. Vitamin D 50,000 units orally twice weekly, which has been stopped.  18. Hydroxyzine 10 mg t.i.d. p.r.n. severe itching.    ALLERGIES:  Sulfa, which causes her itch and rash as well as breathing difficulties; penicillin causes her a rash; allopurinol also causes a rash.  She  states that Percocet causes her constipation with itching.  Vicodin is acceptable.  She does not use aspirin or NSAIDs because of acid reflux.    REVIEW OF SYSTEMS:  A 14-point review of systems reveals glasses for vision.  She does have occasional shortness of breath, especially with exertion.  She has had hypertension since the age of 49.  She does have palpitations noted.  Constipation has been noted also, but uses Reglan for this.  She does have a history of asthma and does use an inhaler and Singulair.  She is a diabetic and it has been more than 10 years.  She does have hypothyroidism since she was age 36.  She also has a history of anemia.  She did have what almost sounded like renal failure about 3 years ago based on medications, but she states that this has resolved.  She does have depression as well as nervous tension.     FAMILY HISTORY:  Positive for a mother who is deceased at age 41 who did have a bleeding dyscrasia.  Also hypertension and diabetes, but questionable diet, leukemia and renal disease.  Father died at age 24, but he had hypertension, but died from metastatic cancer.  Primary is unknown.  Brothers:  She has  one 81 year old, one 63 years old, both diabetic.  One 63 years old.  One 63 years old and is deceased.  She has one sister at age 27 who has hypertension and has a CVA noted.     SOCIAL HISTORY:  [name] is a 64 year old African American single female, retired from [employer].  She denies use of tobacco and alcohol.    PHYSICAL EXAMINATION:   A 64 year old [place] American female, well-developed, well-nourished, alert, cooperative, in moderate distress secondary to left hip and groin pain.  She is 5 feet, 2 inches, weighs 153 pounds with a BMI of 28.   Vital signs:  Temperature 97.7, pulse 72, respirations 16, blood pressure 156/90.  Head is normocephalic.  Eyes:  Pupils equal, round and reactive to light and accommodation with extraocular movements intact.  Ears,  nose and throat were benign.  Chest:  Good expansion. Lungs were essentially clear.  Cardiac:  Regular rhythm and rate with multiple ectopics.  No rubs or gallops are appreciated.  Pulses were 1+, bilateral and symmetric in the lower extremity.  Abdomen:  Obese, soft, nontender.  No masses palpable.  Normal bowel sounds present.   Genital/rectal and breast exam not indicated for an orthopedic evaluation.   CNS:  She appears oriented x3.  Cranial nerves II-XII grossly intact.   Musculoskeletal:  Today I can get her to about 90 to 95 degrees of forward flexion of the left hip.  She has minimal, if any, internal or external rotation, but this is painful with her lying on her back with her leg at 90 degrees of flexion with internal and external rotation.     RADIOGRAPHS:  AVN of the left hip as well as the right hip on a pelvis film.  The left hip, unfortunately though, is collapsed and at least 25 to 30% of the joint is collapsed secondary to the AVN.    CLINICAL IMPRESSION:   1.  AVN of the of the left and right hips, left worse than right, with collapse.  2.  Diabetes, non-insulin-dependent.  3.  Hypothyroidism.  4.  Hypertension.  5.  Palpitations.   6.  Depression.   7.  History of chronic kidney disease.  8.  Anemia, questionable iron-deficient.    RECOMMENDATIONS:   1.  At this time, I have reviewed a form from Dr. Melina Copa and Particia Nearing, PA-C stating that they feel that she would be a candidate for a left total hip arthroplasty.   2.  Therefore, our plan is to proceed with a left total hip arthroplasty.  Procedure, risks and benefits were fully explained to the patient using appropriate models.  All complications were discussed and all her questions were answered.  Therefore, we will proceed with the total hip replacement on the left pending the laboratory studies from the hospital preoperatively.   3.  Had chest pain prior to attempt at [facility].  Case was cancelled.  Cardiology cleared  patient.  Will proceed with THR  Mike Craze. [place], [place] [phone]  01/28/2015 12:33 PM

## 2015-01-21 ENCOUNTER — Encounter (HOSPITAL_COMMUNITY): Payer: Self-pay

## 2015-01-21 ENCOUNTER — Inpatient Hospital Stay (HOSPITAL_COMMUNITY): Admission: RE | Admit: 2015-01-21 | Payer: BLUE CROSS/BLUE SHIELD | Source: Ambulatory Visit

## 2015-01-21 ENCOUNTER — Other Ambulatory Visit (HOSPITAL_COMMUNITY): Payer: BLUE CROSS/BLUE SHIELD

## 2015-01-21 ENCOUNTER — Encounter (HOSPITAL_COMMUNITY)
Admission: RE | Admit: 2015-01-21 | Discharge: 2015-01-21 | Disposition: A | Discharge status: Home / Self Care | Payer: BLUE CROSS/BLUE SHIELD | Source: Ambulatory Visit | Attending: Orthopaedic Surgery | Admitting: Orthopaedic Surgery

## 2015-01-21 DIAGNOSIS — Z0183 Encounter for blood typing: Secondary | ICD-10-CM | POA: Insufficient documentation

## 2015-01-21 DIAGNOSIS — K219 Gastro-esophageal reflux disease without esophagitis: Secondary | ICD-10-CM | POA: Insufficient documentation

## 2015-01-21 DIAGNOSIS — E039 Hypothyroidism, unspecified: Secondary | ICD-10-CM | POA: Diagnosis not present

## 2015-01-21 DIAGNOSIS — I129 Hypertensive chronic kidney disease with stage 1 through stage 4 chronic kidney disease, or unspecified chronic kidney disease: Secondary | ICD-10-CM | POA: Diagnosis not present

## 2015-01-21 DIAGNOSIS — N189 Chronic kidney disease, unspecified: Secondary | ICD-10-CM | POA: Diagnosis not present

## 2015-01-21 DIAGNOSIS — Z01818 Encounter for other preprocedural examination: Secondary | ICD-10-CM | POA: Diagnosis not present

## 2015-01-21 DIAGNOSIS — E1122 Type 2 diabetes mellitus with diabetic chronic kidney disease: Secondary | ICD-10-CM | POA: Insufficient documentation

## 2015-01-21 DIAGNOSIS — Z79899 Other long term (current) drug therapy: Secondary | ICD-10-CM | POA: Diagnosis not present

## 2015-01-21 DIAGNOSIS — Z01812 Encounter for preprocedural laboratory examination: Secondary | ICD-10-CM | POA: Diagnosis not present

## 2015-01-21 DIAGNOSIS — Z7982 Long term (current) use of aspirin: Secondary | ICD-10-CM | POA: Insufficient documentation

## 2015-01-21 DIAGNOSIS — I340 Nonrheumatic mitral (valve) insufficiency: Secondary | ICD-10-CM | POA: Insufficient documentation

## 2015-01-21 HISTORY — DX: Personal history of other medical treatment: Z92.89

## 2015-01-21 LAB — PROTIME-INR
INR: 1.02 (ref 0.00–1.49)
Prothrombin Time: 13.6 seconds (ref 11.6–15.2)

## 2015-01-21 LAB — CBC WITH DIFFERENTIAL/PLATELET
Basophils Absolute: 0 10*3/uL (ref 0.0–0.1)
Basophils Relative: 0 % (ref 0–1)
EOS PCT: 2 % (ref 0–5)
Eosinophils Absolute: 0.1 10*3/uL (ref 0.0–0.7)
HEMATOCRIT: 37.1 % (ref 36.0–46.0)
HEMOGLOBIN: 11.9 g/dL — AB (ref 12.0–15.0)
LYMPHS ABS: 1.6 10*3/uL (ref 0.7–4.0)
LYMPHS PCT: 32 % (ref 12–46)
MCH: 26.8 pg (ref 26.0–34.0)
MCHC: 32.1 g/dL (ref 30.0–36.0)
MCV: 83.6 fL (ref 78.0–100.0)
MONO ABS: 0.3 10*3/uL (ref 0.1–1.0)
MONOS PCT: 7 % (ref 3–12)
Neutro Abs: 3 10*3/uL (ref 1.7–7.7)
Neutrophils Relative %: 59 % (ref 43–77)
Platelets: 203 10*3/uL (ref 150–400)
RBC: 4.44 MIL/uL (ref 3.87–5.11)
RDW: 14 % (ref 11.5–15.5)
WBC: 5.1 10*3/uL (ref 4.0–10.5)

## 2015-01-21 LAB — COMPREHENSIVE METABOLIC PANEL
ALBUMIN: 4.1 g/dL (ref 3.5–5.0)
ALK PHOS: 117 U/L (ref 38–126)
ALT: 11 U/L — ABNORMAL LOW (ref 14–54)
AST: 25 U/L (ref 15–41)
Anion gap: 9 (ref 5–15)
BILIRUBIN TOTAL: 0.7 mg/dL (ref 0.3–1.2)
BUN: 23 mg/dL — AB (ref 6–20)
CHLORIDE: 105 mmol/L (ref 101–111)
CO2: 26 mmol/L (ref 22–32)
Calcium: 10.2 mg/dL (ref 8.9–10.3)
Creatinine, Ser: 1.12 mg/dL — ABNORMAL HIGH (ref 0.44–1.00)
GFR calc Af Amer: 60 mL/min — ABNORMAL LOW (ref >60)
GFR calc non Af Amer: 52 mL/min — ABNORMAL LOW (ref >60)
Glucose, Bld: 109 mg/dL — ABNORMAL HIGH (ref 65–99)
POTASSIUM: 4.3 mmol/L (ref 3.5–5.1)
Sodium: 140 mmol/L (ref 135–145)
Total Protein: 7.7 g/dL (ref 6.5–8.1)

## 2015-01-21 LAB — URINALYSIS, ROUTINE W REFLEX MICROSCOPIC
BILIRUBIN URINE: NEGATIVE
GLUCOSE, UA: NEGATIVE mg/dL
HGB URINE DIPSTICK: NEGATIVE
Ketones, ur: NEGATIVE mg/dL
Leukocytes, UA: NEGATIVE
Nitrite: NEGATIVE
PH: 6 (ref 5.0–8.0)
PROTEIN: NEGATIVE mg/dL
SPECIFIC GRAVITY, URINE: 1.028 (ref 1.005–1.030)
Urobilinogen, UA: 0.2 mg/dL (ref 0.0–1.0)

## 2015-01-21 LAB — APTT: aPTT: 32 seconds (ref 24–37)

## 2015-01-21 LAB — GLUCOSE, CAPILLARY: Glucose-Capillary: 109 mg/dL — ABNORMAL HIGH (ref 65–99)

## 2015-01-21 MED ORDER — CHLORHEXIDINE GLUCONATE 4 % EX LIQD: 60.0000 mL | Freq: Once | CUTANEOUS | Status: DC

## 2015-01-21 NOTE — Progress Notes (Signed)
Cardiologist is Dr.Thomas Claiborne Billings with visit in epic from 12/2014  Sees PA Particia Nearing  EKG in epic from 01-05-15  CXR in epic from 12-25-14  Echo and Stress test in epic from 01-08-15  Heart cath

## 2015-01-21 NOTE — Progress Notes (Signed)
Anesthesia Chart Review:  Pt is 64 year old female scheduled for L total hip arthroplasty on 02/02/2015 with Dr. Durward Fortes.   Cardiologist is Dr. Shelva Majestic. PCP is Particia Nearing, PA.   PMH includes: HTN, DM, CKD, moderate mitral insufficiency (by 12/2014 echo), anemia, hypothyroidism, GERD. Never smoker. BMI 27.5.  Medications include: albuterol, ASA, clonidine, iron, lasix, hydroxyzine, levothyroxine, metoprolol, pioglitazone, potassium, prilosec, quinapril.   Preoperative labs reviewed.  HgbA1c was 6.0 on 12/25/14.   Chest x-ray 12/25/2014 reviewed. No active cardiopulmonary disease.   EKG 01/05/2015: sinus brady (59 bpm). Non-specific ST-t changes. Since previous tracing 12/25/14 R in lead V3 is now seen and " anterior infarct" NO LONGER PRESENT.   Nuclear stress test 01/08/2015: -Myocardial perfusion is normal. The study is normal. This is a low risk study. Overall left ventricular systolic function was normal. LV cavity size is normal. Nuclear stress EF: 73%. The left ventricular ejection fraction is hyperdynamic (>65%). There is no prior study for comparison.  Echo 01/08/2015:  - Left ventricle: The cavity size was normal. Wall thickness was normal. Systolic function was normal. The estimated ejection fraction was in the range of 55% to 60%. Wall motion was normal; there were no regional wall motion abnormalities. Features are consistent with a pseudonormal left ventricular filling pattern, with concomitant abnormal relaxation and increased filling pressure (grade 2 diastolic dysfunction). Doppler parameters are consistent with high ventricular filling pressure. - Mitral valve: There was moderate regurgitation. - Left atrium: The atrium was mildly to moderately dilated. - Right atrium: The atrium was mildly dilated. - Pulmonary arteries: Systolic pressure was mildly increased. PA peak pressure: 37 mm Hg (S). -Impressions: Normal LV function; grade 2 diastolic dysfunction with elevated LV  filling pressure; mild to moderate LAE; mild RAE; thickened mitral valve with moderate MR; mild TR; mildly elevated pulmonary pressure.  Pt was originally scheduled for this surgery 7/26, but it was cancelled due to pt sx of chest pain. Cardiology was consulted, pt had stress test and echo. Angela Bayley, NP with cardiology saw pt for f/u on 8/3, discussed pt with Dr. Caryl Comes. Pt cleared to proceed with surgery. "We recommend continuation of beta blocker therapy throughout the perioperative period and close monitoring of intake and output postoperatively to avoid volume overload."   If no changes, I anticipate pt can proceed with surgery as scheduled.   Willeen Cass, FNP-BC [facility]/Anesthesiology Phone: [phone] 01/21/2015 3:02 PM

## 2015-01-21 NOTE — Pre-Procedure Instructions (Signed)
[name]  01/21/2015      [id number] - [place], [place] [address] [address] Phone: [phone] Fax: [place], [place] [address] Phone: [phone] Fax: [phone]    Your procedure is scheduled on Tues, Aug 23 @ 10:00 AM  Report to [facility] Admitting at 8:00 AM  Call this number if you have problems the morning of surgery:  [phone]   Remember:  Do not eat food or drink liquids after midnight.  Take these medicines the morning of surgery with A SIP OF WATER Albuterol<Bring Your Inhaler With You>,Xanax(Alprazolam),Citalopram(Celexa),Clonidine(Catapres),Fexofenadine(Allegra),Pain Pill(if needed),Synthroid(Levothyroxine),Reglan(Metoclopramide),Metoprolol(Lopressor),and Omeprazole(Prilosec)              Stop taking your Aspirin,Fish Oil,Vitamins,or any Herbal Medications. No Goody's,BC's,Aleve,or Ibuprofen.    Do not wear jewelry, make-up or nail polish.  Do not wear lotions, powders, or perfumes.  You may wear deodorant.  Do not shave 48 hours prior to surgery.    Do not bring valuables to the hospital.  [facility] is not responsible for any belongings or valuables.  Contacts, dentures or bridgework may not be worn into surgery.  Leave your suitcase in the car.  After surgery it may be brought to your room.  For patients admitted to the hospital, discharge time will be determined by your treatment team.  Patients discharged the day of surgery will not be allowed to drive home.    Special instructions:  [place] - Preparing for Surgery  Before surgery, you can play an important role.  Because skin is not sterile, your skin needs to be as free of germs as possible.  You can reduce the number of germs on you skin by washing with CHG (chlorahexidine gluconate) soap before surgery.  CHG is an antiseptic cleaner which kills  germs and bonds with the skin to continue killing germs even after washing.  Please DO NOT use if you have an allergy to CHG or antibacterial soaps.  If your skin becomes reddened/irritated stop using the CHG and inform your nurse when you arrive at Short Stay.  Do not shave (including legs and underarms) for at least 48 hours prior to the first CHG shower.  You may shave your face.  Please follow these instructions carefully:   1.  Shower with CHG Soap the night before surgery and the                                morning of Surgery.  2.  If you choose to wash your hair, wash your hair first as usual with your       normal shampoo.  3.  After you shampoo, rinse your hair and body thoroughly to remove the                      Shampoo.  4.  Use CHG as you would any other liquid soap.  You can apply chg directly       to the skin and wash gently with scrungie or a clean washcloth.  5.  Apply the CHG Soap to your body ONLY FROM THE NECK DOWN.        Do not use on open wounds or open sores.  Avoid contact with your eyes,  ears, mouth and genitals (private parts).  Wash genitals (private parts)       with your normal soap.  6.  Wash thoroughly, paying special attention to the area where your surgery        will be performed.  7.  Thoroughly rinse your body with warm water from the neck down.  8.  DO NOT shower/wash with your normal soap after using and rinsing off       the CHG Soap.  9.  Pat yourself dry with a clean towel.            10.  Wear clean pajamas.            11.  Place clean sheets on your bed the night of your first shower and do not        sleep with pets.  Day of Surgery  Do not apply any lotions/deoderants the morning of surgery.  Please wear clean clothes to the hospital/surgery center.  How to Manage Your Diabetes Before Surgery   Why is it important to control my blood sugar before and after surgery?   Improving blood sugar levels before and after surgery helps  healing and can limit problems.  A way of improving blood sugar control is eating a healthy diet by:  - Eating less sugar and carbohydrates  - Increasing activity/exercise  - Talk with your doctor about reaching your blood sugar goals  High blood sugars (greater than 180 mg/dL) can raise your risk of infections and slow down your recovery so you will need to focus on controlling your diabetes during the weeks before surgery.  Make sure that the doctor who takes care of your diabetes knows about your planned surgery including the date and location.  How do I manage my blood sugars before surgery?   Check your blood sugar at least 4 times a day, 2 days before surgery to make sure that they are not too high or low.   Check your blood sugar the morning of your surgery when you wake up and every 2               hours until you get to the Short-Stay unit.  If your blood sugar is less than 70 mg/dL, you will need to treat for low blood sugar by:  Treat a low blood sugar (less than 70 mg/dL) with 1/2 cup of clear juice (cranberry or apple), 4 glucose tablets, OR glucose gel.  Recheck blood sugar in 15 minutes after treatment (to make sure it is greater than 70 mg/dL).  If blood sugar is not greater than 70 mg/dL on re-check, call [phone] for further instructions.   Report your blood sugar to the Short-Stay nurse when you get to Short-Stay.  References:  [facility], 2007 "How to Manage your Diabetes Before and After Surgery".  What do I do about my diabetes medications?   Do not take oral diabetes medicines (pills) the morning of surgery.  THE NIGHT BEFORE SURGERY do not take your Actos         Do not take other diabetes injectables the day of surgery including Byetta, Victoza, Bydureon, and Trulicity.    If your CBG is greater than 220 mg/dL, you may take 1/2 of your sliding scale (correction) dose of insulin.   For patients with "Insulin  Pumps":  Contact your diabetes doctor for specific instructions before surgery.   Decrease basal insulin rates by 20% at midnight  the night before surgery.  Note that if your surgery is planned to be longer than 2 hours, your insulin pump will be removed and intravenous (IV) insulin will be started and managed by the nurses and anesthesiologist.  You will be able to restart your insulin pump once you are awake and able to manage it.  Make sure to bring insulin pump supplies to the hospital with you in case your site needs to be changed.        Please read over the following fact sheets that you were given. Pain Booklet, Coughing and Deep Breathing, Blood Transfusion Information, MRSA Information and Surgical Site Infection Prevention

## 2015-01-22 LAB — URINE CULTURE

## 2015-02-01 MED ORDER — CEFAZOLIN SODIUM-DEXTROSE 2-3 GM-% IV SOLR
2.0000 g | INTRAVENOUS | Status: DC
  Filled 2015-02-01: qty 50

## 2015-02-01 MED ORDER — ACETAMINOPHEN 10 MG/ML IV SOLN
1000.0000 mg | INTRAVENOUS | Status: AC
  Administered 2015-02-02: 1000 mg via INTRAVENOUS
  Filled 2015-02-01: qty 100

## 2015-02-01 MED ORDER — SODIUM CHLORIDE 0.9 % IV SOLN
INTRAVENOUS | Status: DC
Start: 2015-02-02 — End: 2015-02-02

## 2015-02-02 ENCOUNTER — Inpatient Hospital Stay (HOSPITAL_COMMUNITY): Payer: BLUE CROSS/BLUE SHIELD | Admitting: Emergency Medicine

## 2015-02-02 ENCOUNTER — Encounter (HOSPITAL_COMMUNITY): Payer: Self-pay | Admitting: *Deleted

## 2015-02-02 ENCOUNTER — Inpatient Hospital Stay (HOSPITAL_COMMUNITY): Payer: BLUE CROSS/BLUE SHIELD

## 2015-02-02 ENCOUNTER — Inpatient Hospital Stay (HOSPITAL_COMMUNITY): Payer: BLUE CROSS/BLUE SHIELD | Admitting: Anesthesiology

## 2015-02-02 ENCOUNTER — Encounter (HOSPITAL_COMMUNITY)
Admission: RE | Disposition: A | Discharge status: Home Health | Payer: Self-pay | Source: Ambulatory Visit | Attending: Orthopaedic Surgery

## 2015-02-02 ENCOUNTER — Inpatient Hospital Stay (HOSPITAL_COMMUNITY)
Admission: RE | Admit: 2015-02-02 | Discharge: 2015-02-05 | DRG: 470 | Disposition: A | Discharge status: Home Health | Payer: BLUE CROSS/BLUE SHIELD | Source: Ambulatory Visit | Attending: Orthopaedic Surgery | Admitting: Orthopaedic Surgery

## 2015-02-02 DIAGNOSIS — E039 Hypothyroidism, unspecified: Secondary | ICD-10-CM | POA: Diagnosis present

## 2015-02-02 DIAGNOSIS — M879 Osteonecrosis, unspecified: Principal | ICD-10-CM | POA: Diagnosis present

## 2015-02-02 DIAGNOSIS — Z9889 Other specified postprocedural states: Secondary | ICD-10-CM

## 2015-02-02 DIAGNOSIS — Z885 Allergy status to narcotic agent status: Secondary | ICD-10-CM

## 2015-02-02 DIAGNOSIS — Z6827 Body mass index (BMI) 27.0-27.9, adult: Secondary | ICD-10-CM | POA: Diagnosis not present

## 2015-02-02 DIAGNOSIS — M25552 Pain in left hip: Secondary | ICD-10-CM | POA: Diagnosis present

## 2015-02-02 DIAGNOSIS — M89752 Major osseous defect, left pelvic region and thigh: Secondary | ICD-10-CM | POA: Diagnosis present

## 2015-02-02 DIAGNOSIS — I129 Hypertensive chronic kidney disease with stage 1 through stage 4 chronic kidney disease, or unspecified chronic kidney disease: Secondary | ICD-10-CM | POA: Diagnosis present

## 2015-02-02 DIAGNOSIS — F419 Anxiety disorder, unspecified: Secondary | ICD-10-CM | POA: Diagnosis present

## 2015-02-02 DIAGNOSIS — E1122 Type 2 diabetes mellitus with diabetic chronic kidney disease: Secondary | ICD-10-CM | POA: Diagnosis present

## 2015-02-02 DIAGNOSIS — F329 Major depressive disorder, single episode, unspecified: Secondary | ICD-10-CM | POA: Diagnosis present

## 2015-02-02 DIAGNOSIS — I340 Nonrheumatic mitral (valve) insufficiency: Secondary | ICD-10-CM | POA: Diagnosis present

## 2015-02-02 DIAGNOSIS — Z888 Allergy status to other drugs, medicaments and biological substances status: Secondary | ICD-10-CM

## 2015-02-02 DIAGNOSIS — K219 Gastro-esophageal reflux disease without esophagitis: Secondary | ICD-10-CM | POA: Diagnosis present

## 2015-02-02 DIAGNOSIS — Z880 Allergy status to penicillin: Secondary | ICD-10-CM | POA: Diagnosis not present

## 2015-02-02 DIAGNOSIS — E669 Obesity, unspecified: Secondary | ICD-10-CM | POA: Diagnosis present

## 2015-02-02 DIAGNOSIS — L511 Stevens-Johnson syndrome: Secondary | ICD-10-CM | POA: Diagnosis present

## 2015-02-02 DIAGNOSIS — M87052 Idiopathic aseptic necrosis of left femur: Secondary | ICD-10-CM | POA: Diagnosis present

## 2015-02-02 DIAGNOSIS — D62 Acute posthemorrhagic anemia: Secondary | ICD-10-CM | POA: Diagnosis not present

## 2015-02-02 DIAGNOSIS — N183 Chronic kidney disease, stage 3 (moderate): Secondary | ICD-10-CM | POA: Diagnosis present

## 2015-02-02 DIAGNOSIS — Z833 Family history of diabetes mellitus: Secondary | ICD-10-CM

## 2015-02-02 DIAGNOSIS — Z8249 Family history of ischemic heart disease and other diseases of the circulatory system: Secondary | ICD-10-CM | POA: Diagnosis not present

## 2015-02-02 DIAGNOSIS — Z882 Allergy status to sulfonamides status: Secondary | ICD-10-CM | POA: Diagnosis not present

## 2015-02-02 DIAGNOSIS — Z96649 Presence of unspecified artificial hip joint: Secondary | ICD-10-CM

## 2015-02-02 DIAGNOSIS — N189 Chronic kidney disease, unspecified: Secondary | ICD-10-CM

## 2015-02-02 HISTORY — PX: TOTAL HIP ARTHROPLASTY: SHX124

## 2015-02-02 LAB — GLUCOSE, CAPILLARY
GLUCOSE-CAPILLARY: 112 mg/dL — AB (ref 65–99)
GLUCOSE-CAPILLARY: 163 mg/dL — AB (ref 65–99)
GLUCOSE-CAPILLARY: 149 mg/dL — AB (ref 65–99)
Glucose-Capillary: 134 mg/dL — ABNORMAL HIGH (ref 65–99)

## 2015-02-02 LAB — POCT I-STAT 4, (NA,K, GLUC, HGB,HCT)
GLUCOSE: 186 mg/dL — AB (ref 65–99)
GLUCOSE: 173 mg/dL — AB (ref 65–99)
HEMATOCRIT: 33 % — AB (ref 36.0–46.0)
HEMATOCRIT: 34 % — AB (ref 36.0–46.0)
Hemoglobin: 11.2 g/dL — ABNORMAL LOW (ref 12.0–15.0)
Hemoglobin: 11.6 g/dL — ABNORMAL LOW (ref 12.0–15.0)
POTASSIUM: 4.5 mmol/L (ref 3.5–5.1)
POTASSIUM: 4.5 mmol/L (ref 3.5–5.1)
SODIUM: 135 mmol/L (ref 135–145)
Sodium: 137 mmol/L (ref 135–145)

## 2015-02-02 LAB — PREPARE RBC (CROSSMATCH)

## 2015-02-02 IMAGING — CR DG HIP (WITH OR WITHOUT PELVIS) 2-3V*L*
3 series · 3 of 3 positions shown · non-contrast
Comparison: [DATE]

CLINICAL DATA: Postop hip replacement.

EXAM:
DG HIP (WITH OR WITHOUT PELVIS) 2-3V LEFT

[AP (1 of 2)]
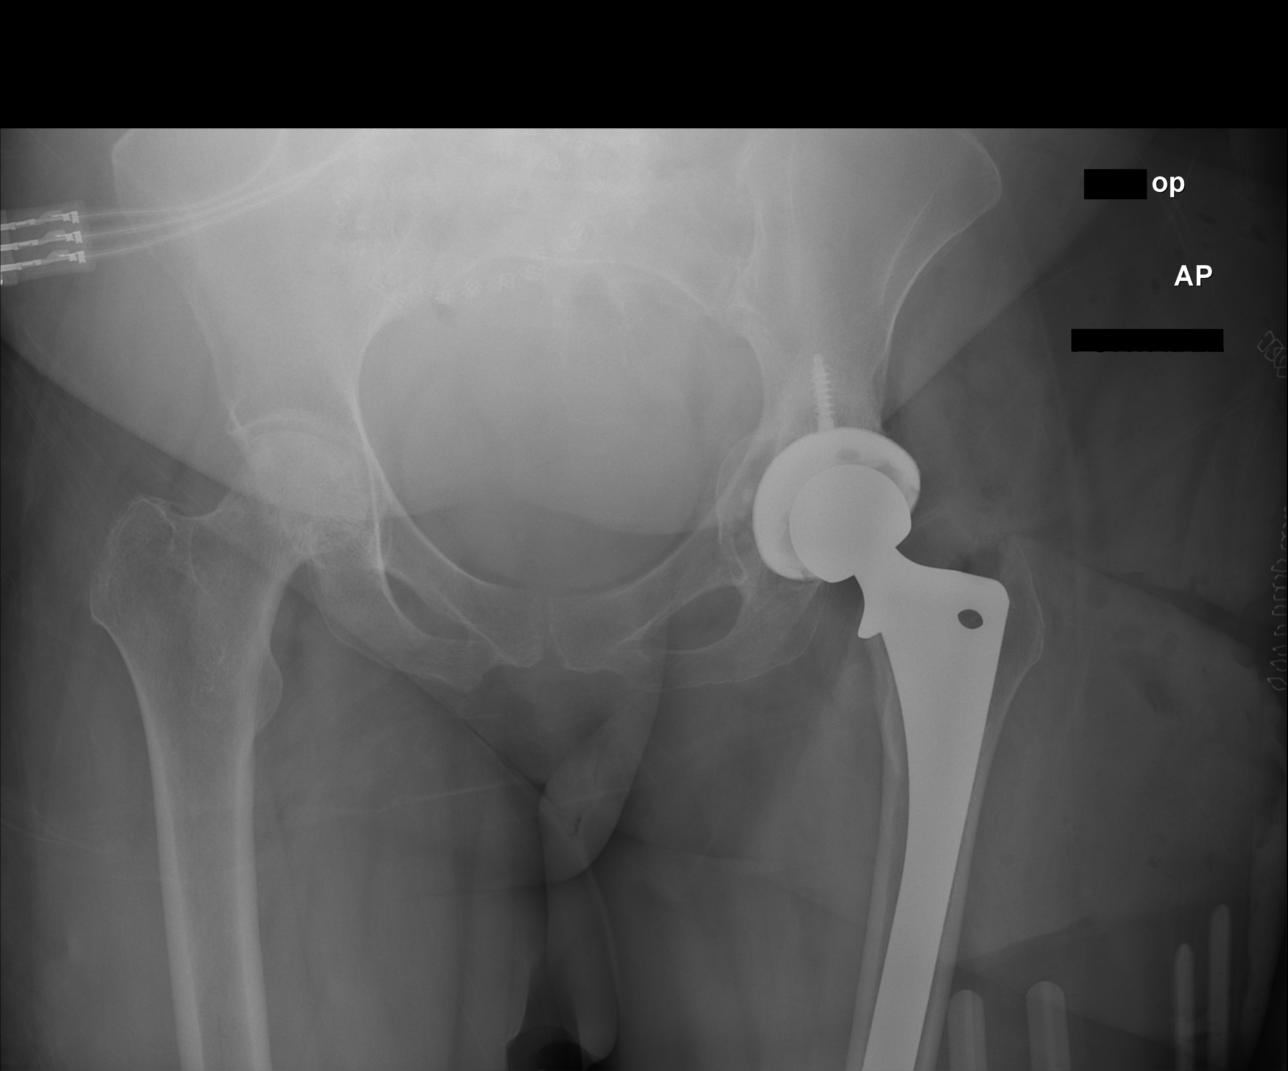

[AP (2 of 2)]
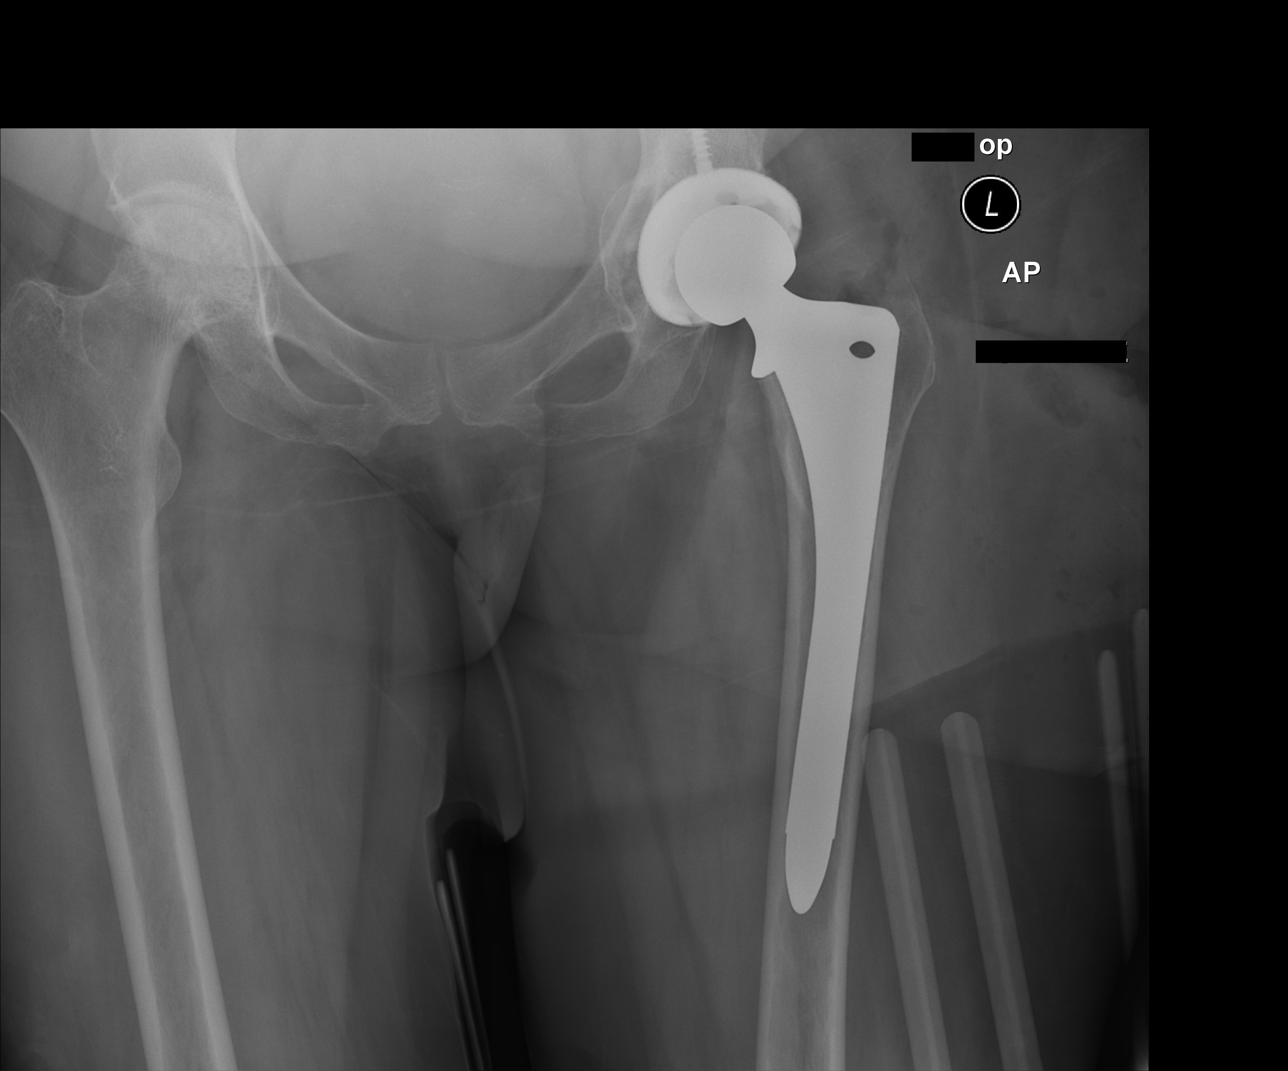

[lateral]
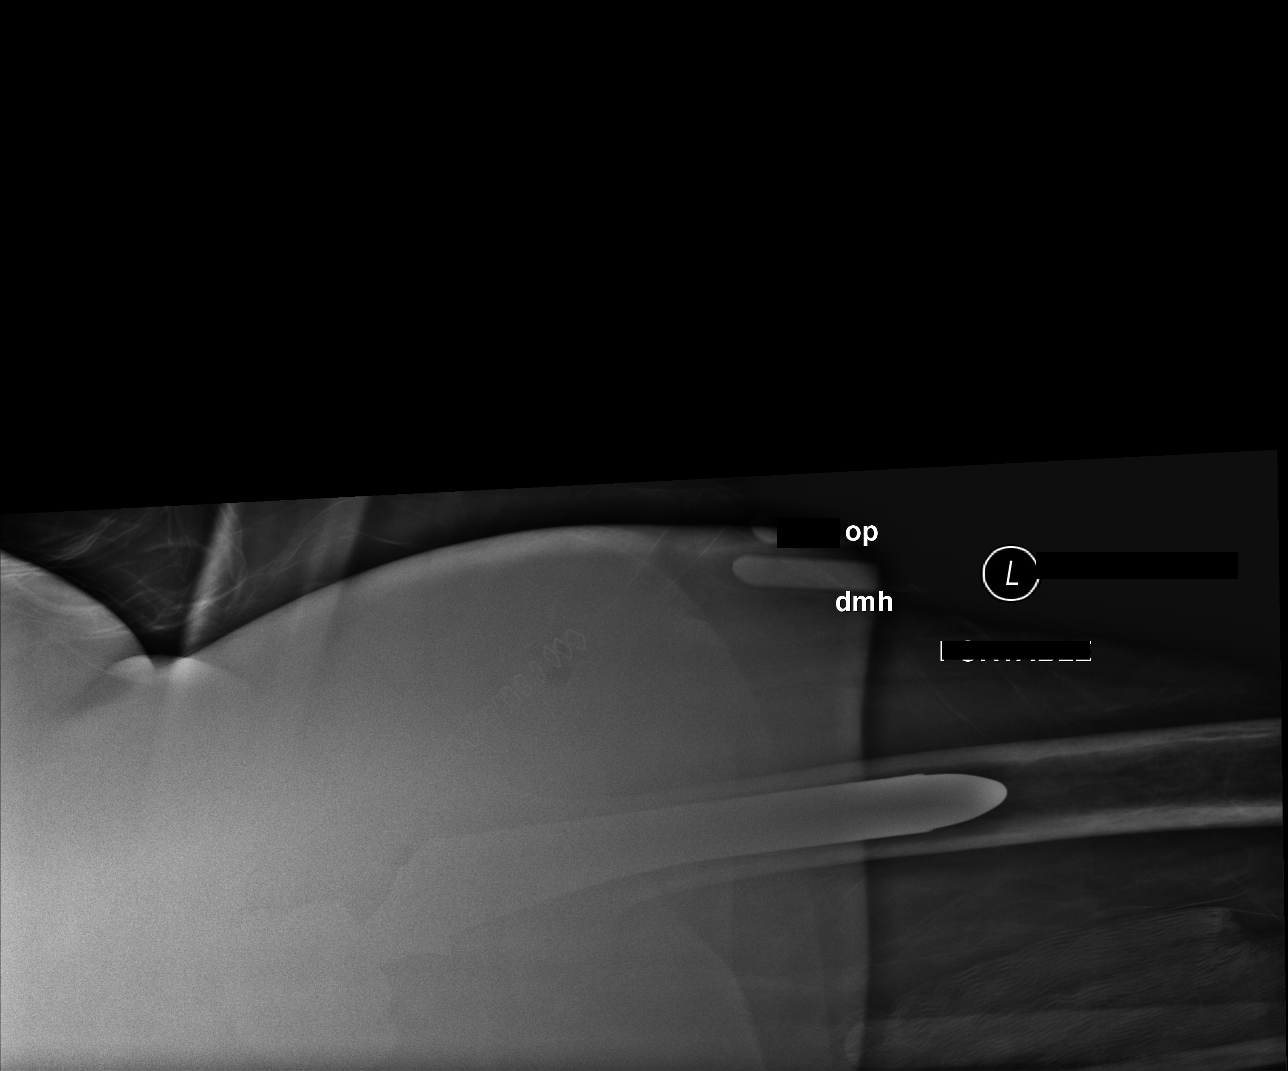

[3 of 3 positions shown; findings below may reference images not displayed]

FINDINGS: Examination demonstrates a left total hip arthroplasty intact and
normally located. There are minimal degenerative changes of the
right hip. There is mild flattening of the right femoral head with
mild heterogeneous sclerosis of the femoral head as cannot exclude
early avascular necrosis. Multiple surgical clips over the left
lower abdomen likely due to hernia repair. Remainder of the exam is
unchanged.
IMPRESSION: Evidence of total left hip arthroplasty which is intact.

Mild degenerate change of the right hip with suggestion of mild
flattening of the femoral head which could be seen due to early
avascular necrosis. Recommend clinical correlation as consider MRI
for further evaluation.

## 2015-02-02 SURGERY — ARTHROPLASTY, HIP, TOTAL,POSTERIOR APPROACH
Anesthesia: General | Site: Hip | Laterality: Left

## 2015-02-02 MED ORDER — VANCOMYCIN HCL IN DEXTROSE 1-5 GM/200ML-% IV SOLN
1000.0000 mg | INTRAVENOUS | Status: AC
  Administered 2015-02-02: 1000 mg via INTRAVENOUS
  Filled 2015-02-02: qty 200

## 2015-02-02 MED ORDER — SODIUM CHLORIDE 0.9 % IJ SOLN
INTRAMUSCULAR | Status: AC
  Filled 2015-02-02: qty 10

## 2015-02-02 MED ORDER — KETOROLAC TROMETHAMINE 15 MG/ML IJ SOLN
15.0000 mg | Freq: Four times a day (QID) | INTRAMUSCULAR | Status: AC
  Administered 2015-02-02 – 2015-02-03 (×4): 15 mg via INTRAVENOUS
  Filled 2015-02-02 (×4): qty 1

## 2015-02-02 MED ORDER — OXYCODONE HCL 5 MG PO TABS
5.0000 mg | ORAL_TABLET | Freq: Once | ORAL | Status: AC | PRN
  Administered 2015-02-02: 5 mg via ORAL

## 2015-02-02 MED ORDER — FENTANYL CITRATE (PF) 250 MCG/5ML IJ SOLN
INTRAMUSCULAR | Status: AC
  Filled 2015-02-02: qty 5

## 2015-02-02 MED ORDER — METOCLOPRAMIDE HCL 5 MG/ML IJ SOLN
5.0000 mg | Freq: Three times a day (TID) | INTRAMUSCULAR | Status: DC | PRN
  Filled 2015-02-02: qty 2

## 2015-02-02 MED ORDER — POLYVINYL ALCOHOL 1.4 % OP SOLN
1.0000 [drp] | OPHTHALMIC | Status: DC | PRN
  Filled 2015-02-02: qty 15

## 2015-02-02 MED ORDER — BISACODYL 10 MG RE SUPP: 10.0000 mg | Freq: Every day | RECTAL | Status: DC | PRN

## 2015-02-02 MED ORDER — LORATADINE 10 MG PO TABS
10.0000 mg | ORAL_TABLET | Freq: Every day | ORAL | Status: DC
  Administered 2015-02-03 – 2015-02-05 (×3): 10 mg via ORAL
  Filled 2015-02-02 (×3): qty 1

## 2015-02-02 MED ORDER — LACTATED RINGERS IV SOLN: INTRAVENOUS | Status: DC

## 2015-02-02 MED ORDER — HYDROMORPHONE HCL 1 MG/ML IJ SOLN
INTRAMUSCULAR | Status: AC
  Filled 2015-02-02: qty 1

## 2015-02-02 MED ORDER — PIOGLITAZONE HCL 30 MG PO TABS
30.0000 mg | ORAL_TABLET | Freq: Every day | ORAL | Status: DC
  Administered 2015-02-03 – 2015-02-05 (×3): 30 mg via ORAL
  Filled 2015-02-02 (×3): qty 1

## 2015-02-02 MED ORDER — LACTATED RINGERS IV SOLN
INTRAVENOUS | Status: DC | PRN
  Administered 2015-02-02 (×2): via INTRAVENOUS

## 2015-02-02 MED ORDER — ONDANSETRON HCL 4 MG/2ML IJ SOLN
INTRAMUSCULAR | Status: DC | PRN
  Administered 2015-02-02: 4 mg via INTRAVENOUS

## 2015-02-02 MED ORDER — FERROUS SULFATE DRIED ER 140 (45 FE) MG PO TBCR: 1.0000 | EXTENDED_RELEASE_TABLET | Freq: Every day | ORAL | Status: DC

## 2015-02-02 MED ORDER — HYDRALAZINE HCL 20 MG/ML IJ SOLN
INTRAMUSCULAR | Status: DC | PRN
  Administered 2015-02-02 (×3): 2 mg via INTRAVENOUS

## 2015-02-02 MED ORDER — KETOROLAC TROMETHAMINE 15 MG/ML IJ SOLN
INTRAMUSCULAR | Status: AC
  Administered 2015-02-02: 15 mg
  Filled 2015-02-02: qty 1

## 2015-02-02 MED ORDER — 0.9 % SODIUM CHLORIDE (POUR BTL) OPTIME
TOPICAL | Status: DC | PRN
  Administered 2015-02-02: 1000 mL

## 2015-02-02 MED ORDER — CLONIDINE HCL 0.1 MG PO TABS
0.1000 mg | ORAL_TABLET | Freq: Two times a day (BID) | ORAL | Status: DC
  Administered 2015-02-02 – 2015-02-05 (×6): 0.1 mg via ORAL
  Filled 2015-02-02 (×6): qty 1

## 2015-02-02 MED ORDER — HYDROMORPHONE HCL 1 MG/ML IJ SOLN
0.5000 mg | INTRAMUSCULAR | Status: DC | PRN
Start: 2015-02-02 — End: 2015-02-05

## 2015-02-02 MED ORDER — LIDOCAINE HCL (CARDIAC) 20 MG/ML IV SOLN
INTRAVENOUS | Status: AC
  Filled 2015-02-02: qty 5

## 2015-02-02 MED ORDER — PHENOL 1.4 % MT LIQD: 1.0000 | OROMUCOSAL | Status: DC | PRN

## 2015-02-02 MED ORDER — METHOCARBAMOL 500 MG PO TABS
ORAL_TABLET | ORAL | Status: AC
Start: 2015-02-02 — End: 2015-02-03
  Filled 2015-02-02: qty 1

## 2015-02-02 MED ORDER — ACETAMINOPHEN 500 MG PO TABS
1000.0000 mg | ORAL_TABLET | Freq: Four times a day (QID) | ORAL | Status: AC
  Administered 2015-02-02 – 2015-02-03 (×4): 1000 mg via ORAL
  Filled 2015-02-02 (×4): qty 2

## 2015-02-02 MED ORDER — ALPRAZOLAM 0.5 MG PO TABS
0.5000 mg | ORAL_TABLET | Freq: Two times a day (BID) | ORAL | Status: DC
  Administered 2015-02-02 – 2015-02-05 (×6): 0.5 mg via ORAL
  Filled 2015-02-02 (×6): qty 1

## 2015-02-02 MED ORDER — ONDANSETRON HCL 4 MG/2ML IJ SOLN: 4.0000 mg | Freq: Four times a day (QID) | INTRAMUSCULAR | Status: DC | PRN

## 2015-02-02 MED ORDER — HYDROXYZINE HCL 10 MG PO TABS
10.0000 mg | ORAL_TABLET | Freq: Four times a day (QID) | ORAL | Status: DC | PRN
  Filled 2015-02-02: qty 1

## 2015-02-02 MED ORDER — FERROUS SULFATE 325 (65 FE) MG PO TABS
325.0000 mg | ORAL_TABLET | Freq: Every day | ORAL | Status: DC
  Administered 2015-02-03 – 2015-02-05 (×3): 325 mg via ORAL
  Filled 2015-02-02 (×3): qty 1

## 2015-02-02 MED ORDER — PROPOFOL 10 MG/ML IV BOLUS
INTRAVENOUS | Status: AC
  Filled 2015-02-02: qty 20

## 2015-02-02 MED ORDER — LACTATED RINGERS IV SOLN
INTRAVENOUS | Status: DC
  Administered 2015-02-02: 09:00:00 via INTRAVENOUS

## 2015-02-02 MED ORDER — LIDOCAINE HCL (CARDIAC) 20 MG/ML IV SOLN
INTRAVENOUS | Status: DC | PRN
  Administered 2015-02-02: 60 mg via INTRAVENOUS

## 2015-02-02 MED ORDER — VANCOMYCIN HCL IN DEXTROSE 1-5 GM/200ML-% IV SOLN
1000.0000 mg | Freq: Two times a day (BID) | INTRAVENOUS | Status: AC
  Administered 2015-02-02: 1000 mg via INTRAVENOUS
  Filled 2015-02-02: qty 200

## 2015-02-02 MED ORDER — METHOCARBAMOL 1000 MG/10ML IJ SOLN
500.0000 mg | Freq: Four times a day (QID) | INTRAVENOUS | Status: DC | PRN
  Filled 2015-02-02 (×2): qty 5

## 2015-02-02 MED ORDER — MENTHOL 3 MG MT LOZG: 1.0000 | LOZENGE | OROMUCOSAL | Status: DC | PRN

## 2015-02-02 MED ORDER — PROMETHAZINE HCL 25 MG/ML IJ SOLN: 6.2500 mg | INTRAMUSCULAR | Status: DC | PRN

## 2015-02-02 MED ORDER — INSULIN ASPART 100 UNIT/ML ~~LOC~~ SOLN
0.0000 [IU] | Freq: Three times a day (TID) | SUBCUTANEOUS | Status: DC
Start: 2015-02-02 — End: 2015-02-05
  Administered 2015-02-02 – 2015-02-05 (×5): 2 [IU] via SUBCUTANEOUS

## 2015-02-02 MED ORDER — POLYETHYL GLYCOL-PROPYL GLYCOL 0.4-0.3 % OP SOLN: 1.0000 [drp] | Freq: Every day | OPHTHALMIC | Status: DC | PRN

## 2015-02-02 MED ORDER — GLYCOPYRROLATE 0.2 MG/ML IJ SOLN
INTRAMUSCULAR | Status: DC | PRN
  Administered 2015-02-02: 0.4 mg via INTRAVENOUS

## 2015-02-02 MED ORDER — PROPOFOL 10 MG/ML IV BOLUS
INTRAVENOUS | Status: DC | PRN
  Administered 2015-02-02: 20 mg via INTRAVENOUS
  Administered 2015-02-02: 150 mg via INTRAVENOUS
  Administered 2015-02-02: 30 mg via INTRAVENOUS

## 2015-02-02 MED ORDER — OXYCODONE HCL 5 MG PO TABS
ORAL_TABLET | ORAL | Status: AC
  Filled 2015-02-02: qty 3

## 2015-02-02 MED ORDER — LISINOPRIL 40 MG PO TABS
40.0000 mg | ORAL_TABLET | Freq: Every day | ORAL | Status: DC
  Administered 2015-02-02 – 2015-02-05 (×4): 40 mg via ORAL
  Filled 2015-02-02 (×4): qty 1

## 2015-02-02 MED ORDER — EPHEDRINE SULFATE 50 MG/ML IJ SOLN
INTRAMUSCULAR | Status: DC | PRN
  Administered 2015-02-02: 5 mg via INTRAVENOUS
  Administered 2015-02-02: 10 mg via INTRAVENOUS

## 2015-02-02 MED ORDER — FOLIC ACID 800 MCG PO TABS: 400.0000 ug | ORAL_TABLET | Freq: Every day | ORAL | Status: DC

## 2015-02-02 MED ORDER — ONDANSETRON HCL 4 MG/2ML IJ SOLN
INTRAMUSCULAR | Status: AC
  Filled 2015-02-02: qty 2

## 2015-02-02 MED ORDER — ACETAMINOPHEN 10 MG/ML IV SOLN: 1000.0000 mg | Freq: Four times a day (QID) | INTRAVENOUS | Status: DC

## 2015-02-02 MED ORDER — METHOCARBAMOL 500 MG PO TABS
500.0000 mg | ORAL_TABLET | Freq: Four times a day (QID) | ORAL | Status: DC | PRN
  Administered 2015-02-02 – 2015-02-05 (×8): 500 mg via ORAL
  Filled 2015-02-02 (×8): qty 1

## 2015-02-02 MED ORDER — HYDROMORPHONE HCL 1 MG/ML IJ SOLN
0.2500 mg | INTRAMUSCULAR | Status: DC | PRN
  Administered 2015-02-02 (×4): 0.5 mg via INTRAVENOUS

## 2015-02-02 MED ORDER — LEVOTHYROXINE SODIUM 88 MCG PO TABS
88.0000 ug | ORAL_TABLET | Freq: Every day | ORAL | Status: DC
  Administered 2015-02-03 – 2015-02-05 (×3): 88 ug via ORAL
  Filled 2015-02-02 (×3): qty 1

## 2015-02-02 MED ORDER — DOCUSATE SODIUM 100 MG PO CAPS
100.0000 mg | ORAL_CAPSULE | Freq: Two times a day (BID) | ORAL | Status: DC
  Administered 2015-02-02 – 2015-02-05 (×6): 100 mg via ORAL
  Filled 2015-02-02 (×6): qty 1

## 2015-02-02 MED ORDER — PANTOPRAZOLE SODIUM 40 MG PO TBEC
80.0000 mg | DELAYED_RELEASE_TABLET | Freq: Every day | ORAL | Status: DC
  Administered 2015-02-03 – 2015-02-05 (×3): 80 mg via ORAL
  Filled 2015-02-02 (×3): qty 2

## 2015-02-02 MED ORDER — METOPROLOL TARTRATE 100 MG PO TABS
100.0000 mg | ORAL_TABLET | Freq: Two times a day (BID) | ORAL | Status: DC
  Administered 2015-02-02 – 2015-02-05 (×6): 100 mg via ORAL
  Filled 2015-02-02 (×6): qty 1

## 2015-02-02 MED ORDER — RIVAROXABAN 10 MG PO TABS
10.0000 mg | ORAL_TABLET | Freq: Every day | ORAL | Status: DC
  Administered 2015-02-03 – 2015-02-05 (×3): 10 mg via ORAL
  Filled 2015-02-02 (×3): qty 1

## 2015-02-02 MED ORDER — QUINAPRIL HCL 10 MG PO TABS: 40.0000 mg | ORAL_TABLET | Freq: Every day | ORAL | Status: DC

## 2015-02-02 MED ORDER — POLYETHYLENE GLYCOL 3350 17 G PO PACK: 17.0000 g | PACK | Freq: Every day | ORAL | Status: DC | PRN

## 2015-02-02 MED ORDER — MIDAZOLAM HCL 2 MG/2ML IJ SOLN
INTRAMUSCULAR | Status: AC
  Filled 2015-02-02: qty 4

## 2015-02-02 MED ORDER — FOLIC ACID 1 MG PO TABS
0.5000 mg | ORAL_TABLET | Freq: Every day | ORAL | Status: DC
  Administered 2015-02-03 – 2015-02-05 (×3): 0.5 mg via ORAL
  Filled 2015-02-02 (×3): qty 1

## 2015-02-02 MED ORDER — SODIUM CHLORIDE 0.9 % IV SOLN
INTRAVENOUS | Status: DC | PRN
  Administered 2015-02-02: 11:00:00 via INTRAVENOUS

## 2015-02-02 MED ORDER — MIDAZOLAM HCL 5 MG/5ML IJ SOLN
INTRAMUSCULAR | Status: DC | PRN
  Administered 2015-02-02: 1 mg via INTRAVENOUS

## 2015-02-02 MED ORDER — CITALOPRAM HYDROBROMIDE 40 MG PO TABS
40.0000 mg | ORAL_TABLET | Freq: Every day | ORAL | Status: DC
  Administered 2015-02-03 – 2015-02-05 (×3): 40 mg via ORAL
  Filled 2015-02-02 (×3): qty 1

## 2015-02-02 MED ORDER — ALUM & MAG HYDROXIDE-SIMETH 200-200-20 MG/5ML PO SUSP: 30.0000 mL | ORAL | Status: DC | PRN

## 2015-02-02 MED ORDER — FENTANYL CITRATE (PF) 100 MCG/2ML IJ SOLN
INTRAMUSCULAR | Status: DC | PRN
  Administered 2015-02-02 (×5): 50 ug via INTRAVENOUS

## 2015-02-02 MED ORDER — SALINE SPRAY 0.65 % NA SOLN
1.0000 | NASAL | Status: DC | PRN
  Filled 2015-02-02: qty 44

## 2015-02-02 MED ORDER — ALBUMIN HUMAN 5 % IV SOLN
INTRAVENOUS | Status: DC | PRN
  Administered 2015-02-02: 12:00:00 via INTRAVENOUS

## 2015-02-02 MED ORDER — MAGNESIUM CITRATE PO SOLN: 1.0000 | Freq: Once | ORAL | Status: DC | PRN

## 2015-02-02 MED ORDER — BUPIVACAINE-EPINEPHRINE (PF) 0.25% -1:200000 IJ SOLN
INTRAMUSCULAR | Status: DC | PRN
  Administered 2015-02-02: 30 mL

## 2015-02-02 MED ORDER — ROCURONIUM BROMIDE 100 MG/10ML IV SOLN
INTRAVENOUS | Status: DC | PRN
  Administered 2015-02-02: 40 mg via INTRAVENOUS

## 2015-02-02 MED ORDER — MONTELUKAST SODIUM 10 MG PO TABS
10.0000 mg | ORAL_TABLET | Freq: Every day | ORAL | Status: DC
  Administered 2015-02-02 – 2015-02-04 (×3): 10 mg via ORAL
  Filled 2015-02-02 (×3): qty 1

## 2015-02-02 MED ORDER — SODIUM CHLORIDE 0.9 % IV SOLN
75.0000 mL/h | INTRAVENOUS | Status: DC
  Administered 2015-02-02 – 2015-02-03 (×2): 75 mL/h via INTRAVENOUS

## 2015-02-02 MED ORDER — METOCLOPRAMIDE HCL 5 MG PO TABS
5.0000 mg | ORAL_TABLET | Freq: Three times a day (TID) | ORAL | Status: DC | PRN
  Filled 2015-02-02: qty 2

## 2015-02-02 MED ORDER — MAGNESIUM 400 MG PO TABS: 400.0000 mg | ORAL_TABLET | Freq: Every day | ORAL | Status: DC

## 2015-02-02 MED ORDER — GLYCOPYRROLATE 0.2 MG/ML IJ SOLN
INTRAMUSCULAR | Status: AC
  Filled 2015-02-02: qty 3

## 2015-02-02 MED ORDER — NEOSTIGMINE METHYLSULFATE 10 MG/10ML IV SOLN
INTRAVENOUS | Status: DC | PRN
  Administered 2015-02-02: 3 mg via INTRAVENOUS

## 2015-02-02 MED ORDER — OXYCODONE HCL 5 MG PO TABS
5.0000 mg | ORAL_TABLET | ORAL | Status: DC | PRN
  Administered 2015-02-02 – 2015-02-05 (×15): 10 mg via ORAL
  Filled 2015-02-02 (×14): qty 2

## 2015-02-02 MED ORDER — FUROSEMIDE 40 MG PO TABS
40.0000 mg | ORAL_TABLET | Freq: Every day | ORAL | Status: DC
Start: 2015-02-03 — End: 2015-02-05
  Administered 2015-02-03 – 2015-02-05 (×3): 40 mg via ORAL
  Filled 2015-02-02 (×3): qty 1

## 2015-02-02 MED ORDER — ALBUTEROL SULFATE (2.5 MG/3ML) 0.083% IN NEBU: 3.0000 mL | INHALATION_SOLUTION | Freq: Two times a day (BID) | RESPIRATORY_TRACT | Status: DC | PRN

## 2015-02-02 MED ORDER — OXYCODONE HCL 5 MG/5ML PO SOLN: 5.0000 mg | Freq: Once | ORAL | Status: AC | PRN

## 2015-02-02 MED ORDER — BUPIVACAINE-EPINEPHRINE (PF) 0.25% -1:200000 IJ SOLN
INTRAMUSCULAR | Status: AC
  Filled 2015-02-02: qty 30

## 2015-02-02 MED ORDER — ONDANSETRON HCL 4 MG PO TABS: 4.0000 mg | ORAL_TABLET | Freq: Four times a day (QID) | ORAL | Status: DC | PRN

## 2015-02-02 MED ORDER — CROMOLYN SODIUM 5.2 MG/ACT NA AERS: 1.0000 | INHALATION_SPRAY | Freq: Every day | NASAL | Status: DC | PRN

## 2015-02-02 SURGICAL SUPPLY — 55 items
BLADE SAW SAG 73X25 THK (BLADE) ×2
BLADE SAW SGTL 73X25 THK (BLADE) ×1 IMPLANT
BRUSH FEMORAL CANAL (MISCELLANEOUS) IMPLANT
CAPT HIP TOTAL 2 ×3 IMPLANT
COVER SURGICAL LIGHT HANDLE (MISCELLANEOUS) ×3 IMPLANT
DRAPE INCISE IOBAN 66X45 STRL (DRAPES) ×3 IMPLANT
DRAPE ORTHO SPLIT 77X108 STRL (DRAPES) ×4
DRAPE SURG ORHT 6 SPLT 77X108 (DRAPES) ×2 IMPLANT
DRSG MEPILEX BORDER 4X12 (GAUZE/BANDAGES/DRESSINGS) ×3 IMPLANT
DURAPREP 26ML APPLICATOR (WOUND CARE) ×6 IMPLANT
ELECT BLADE 6.5 EXT (BLADE) IMPLANT
ELECT REM PT RETURN 9FT ADLT (ELECTROSURGICAL) ×3
ELECTRODE REM PT RTRN 9FT ADLT (ELECTROSURGICAL) ×1 IMPLANT
EVACUATOR 1/8 PVC DRAIN (DRAIN) IMPLANT
FACESHIELD WRAPAROUND (MASK) ×6 IMPLANT
GLOVE BIOGEL PI IND STRL 8 (GLOVE) ×2 IMPLANT
GLOVE BIOGEL PI IND STRL 8.5 (GLOVE) ×1 IMPLANT
GLOVE BIOGEL PI INDICATOR 8 (GLOVE) ×4
GLOVE BIOGEL PI INDICATOR 8.5 (GLOVE) ×2
GLOVE ECLIPSE 8.0 STRL XLNG CF (GLOVE) ×6 IMPLANT
GLOVE SURG ORTHO 8.5 STRL (GLOVE) ×6 IMPLANT
GOWN STRL REUS W/ TWL LRG LVL3 (GOWN DISPOSABLE) ×2 IMPLANT
GOWN STRL REUS W/TWL 2XL LVL3 (GOWN DISPOSABLE) ×3 IMPLANT
GOWN STRL REUS W/TWL LRG LVL3 (GOWN DISPOSABLE) ×4
HANDPIECE INTERPULSE COAX TIP (DISPOSABLE)
IMMOBILIZER KNEE 20 (SOFTGOODS) IMPLANT
IMMOBILIZER KNEE 22 (SOFTGOODS) ×3 IMPLANT
IMMOBILIZER KNEE 22 UNIV (SOFTGOODS) ×3 IMPLANT
KIT BASIN OR (CUSTOM PROCEDURE TRAY) ×3 IMPLANT
KIT ROOM TURNOVER OR (KITS) ×3 IMPLANT
MANIFOLD NEPTUNE II (INSTRUMENTS) ×3 IMPLANT
NEEDLE 22X1 1/2 (OR ONLY) (NEEDLE) ×3 IMPLANT
NS IRRIG 1000ML POUR BTL (IV SOLUTION) ×3 IMPLANT
PACK TOTAL JOINT (CUSTOM PROCEDURE TRAY) ×3 IMPLANT
PACK UNIVERSAL I (CUSTOM PROCEDURE TRAY) ×3 IMPLANT
PAD ARMBOARD 7.5X6 YLW CONV (MISCELLANEOUS) ×6 IMPLANT
PRESSURIZER FEMORAL UNIV (MISCELLANEOUS) IMPLANT
SET HNDPC FAN SPRY TIP SCT (DISPOSABLE) IMPLANT
STAPLER VISISTAT 35W (STAPLE) ×3 IMPLANT
SUCTION FRAZIER TIP 10 FR DISP (SUCTIONS) ×3 IMPLANT
SUT BONE WAX W31G (SUTURE) IMPLANT
SUT ETHIBOND NAB CT1 #1 30IN (SUTURE) ×6 IMPLANT
SUT MNCRL AB 3-0 PS2 18 (SUTURE) ×3 IMPLANT
SUT VIC AB 0 CT1 27 (SUTURE) ×2
SUT VIC AB 0 CT1 27XBRD ANBCTR (SUTURE) ×1 IMPLANT
SUT VIC AB 1 CT1 27 (SUTURE) ×2
SUT VIC AB 1 CT1 27XBRD ANBCTR (SUTURE) ×1 IMPLANT
SUT VIC AB 2-0 CT1 27 (SUTURE) ×4
SUT VIC AB 2-0 CT1 TAPERPNT 27 (SUTURE) ×2 IMPLANT
SYR CONTROL 10ML LL (SYRINGE) ×3 IMPLANT
TOWEL OR 17X24 6PK STRL BLUE (TOWEL DISPOSABLE) ×3 IMPLANT
TOWEL OR 17X26 10 PK STRL BLUE (TOWEL DISPOSABLE) ×3 IMPLANT
TOWER CARTRIDGE SMART MIX (DISPOSABLE) IMPLANT
WATER STERILE IRR 1000ML POUR (IV SOLUTION) IMPLANT
WRAP KNEE MAXI GEL POST OP (GAUZE/BANDAGES/DRESSINGS) ×3 IMPLANT

## 2015-02-02 NOTE — Progress Notes (Signed)
Orthopedic Tech Progress Note Patient Details:  [name] [birth year] [id number] Patient already has knee immobilizer. Patient ID: [name], female   DOB: 02-Apr-1951, 64 y.o.   MRN: [id number]   Braulio Bosch 02/02/2015, 10:41 PM

## 2015-02-02 NOTE — Transfer of Care (Signed)
Immediate Anesthesia Transfer of Care Note  Patient: [name]  Procedure(s) Performed: Procedure(s): LEFT TOTAL HIP ARTHROPLASTY (Left)  Patient Location: PACU  Anesthesia Type:General  Level of Consciousness: awake, alert  and oriented  Airway & Oxygen Therapy: Patient Spontanous Breathing and Patient connected to nasal cannula oxygen  Post-op Assessment: Report given to RN and Post -op Vital signs reviewed and stable  Post vital signs: Reviewed and stable  Last Vitals:  Filed Vitals:   02/02/15 0829  BP: 124/51  Pulse: 62  Temp: 36.4 C  Resp: 18    Complications: No apparent anesthesia complications

## 2015-02-02 NOTE — Evaluation (Signed)
Physical Therapy Evaluation Patient Details Name: [name] MRN: [id number] DOB: 1950/09/28 Today's Date: 02/02/2015   History of Present Illness  Pt is a 64 y/o F w/ prior h/o Lt hip AVN, now s/p Lt THA.  Pt's PMH includes gout, CKD, anemia, anxiety and depression, HTN, DM.  Clinical Impression  Pt is s/p Rt THA resulting in the deficits listed below (see PT Problem List). Ms. Badalamenti very lethargic this session w/ eyes closed or barely open throughout most of session.  Anticipate that once pt is more alert she will progress well w/ therapy. Sit>stand w/ mod assist and stand pivot w/ min assist this session.  Pt will benefit from skilled PT to increase their independence and safety with mobility to allow discharge to the venue listed below.     Follow Up Recommendations Home health PT;Supervision/Assistance - 24 hour    Equipment Recommendations  None recommended by PT    Recommendations for Other Services OT consult     Precautions / Restrictions Precautions Precautions: Posterior Hip;Fall Precaution Booklet Issued: Yes (comment) Precaution Comments: Reviewed post hip precautions at beginning of session.  Pt unable to recall any precautions at end of session, reviewed them w/ pt. Required Braces or Orthoses: Knee Immobilizer - Left Knee Immobilizer - Left: Other (comment) (not specified in order) Restrictions Weight Bearing Restrictions: Yes LLE Weight Bearing: Weight bearing as tolerated      Mobility  Bed Mobility Overal bed mobility: Modified Independent             General bed mobility comments: HOB elevated and pt uses rails w/ very increased time.  Cues for technique and to remind pt of hip precautions.  Transfers Overall transfer level: Needs assistance Equipment used: Rolling walker (2 wheeled) Transfers: Sit to/from [facility] Sit to Stand: Mod assist;From elevated surface Stand pivot transfers: Min assist       General transfer comment:  Mod assist to stabilize w/ sit>stand as pt has LOB x1.  Min assist stabilizing pt's trunk during stand pivot to [facility] and again to recliner chair as pt is leaning away from Lt hip.    Ambulation/Gait                Stairs            Wheelchair Mobility    Modified Rankin (Stroke Patients Only)       Balance Overall balance assessment: Needs assistance Sitting-balance support: Bilateral upper extremity supported;Feet supported Sitting balance-Leahy Scale: Poor Sitting balance - Comments: Close min guard as pt does not keep her eyes open at all times Postural control: Posterior lean;Right lateral lean Standing balance support: Bilateral upper extremity supported;During functional activity Standing balance-Leahy Scale: Poor Standing balance comment: Requires mod assist to stabilize upon standing                             Pertinent Vitals/Pain Pain Assessment: 0-10 Pain Score: 8  Pain Location: Lt hip Pain Descriptors / Indicators: Aching;Grimacing;Guarding Pain Intervention(s): Limited activity within patient's tolerance;Monitored during session;Repositioned    Home Living Family/patient expects to be discharged to:: Private residence Living Arrangements: Other relatives (sister) Available Help at Discharge: Family;Available 24 hours/day (sister) Type of Home: House Home Access: Stairs to enter Entrance Stairs-Rails: Right Entrance Stairs-Number of Steps: 5 Home Layout: One level Home Equipment: Walker - 2 wheels;Bedside commode      Prior Function Level of Independence: Independent with assistive device(s)  Comments: Pt used RW PTA at all times     Hand Dominance        Extremity/Trunk Assessment   Upper Extremity Assessment: Defer to OT evaluation           Lower Extremity Assessment: LLE deficits/detail   LLE Deficits / Details: weakness and limited ROM as expected s/p Lt THA  Cervical / Trunk Assessment: Kyphotic   Communication   Communication: No difficulties  Cognition Arousal/Alertness: Lethargic Behavior During Therapy: WFL for tasks assessed/performed Overall Cognitive Status: Within Functional Limits for tasks assessed                      General Comments General comments (skin integrity, edema, etc.): Pt very lethargic this session w/ eyes closed or barely open throughout most of session.  Anticipate that once pt is more alert she will progress well w/ therapy.    Exercises Total Joint Exercises Ankle Circles/Pumps: AROM;Both;15 reps;Seated Gluteal Sets: Strengthening;Both;10 reps;Seated      Assessment/Plan    PT Assessment Patient needs continued PT services  PT Diagnosis Difficulty walking;Abnormality of gait;Generalized weakness;Acute pain   PT Problem List Decreased range of motion;Decreased strength;Decreased activity tolerance;Decreased balance;Decreased mobility;Decreased knowledge of use of DME;Decreased safety awareness;Decreased knowledge of precautions;Decreased skin integrity;Pain  PT Treatment Interventions DME instruction;Gait training;Stair training;Functional mobility training;Therapeutic activities;Therapeutic exercise;Balance training;Neuromuscular re-education;Patient/family education;Modalities   PT Goals (Current goals can be found in the Care Plan section) Acute Rehab PT Goals Patient Stated Goal: none stated PT Goal Formulation: With patient Time For Goal Achievement: 02/09/15 Potential to Achieve Goals: Good    Frequency 7X/week   Barriers to discharge Inaccessible home environment 5 steps to enter home    Co-evaluation               End of Session Equipment Utilized During Treatment: Gait belt;Left knee immobilizer Activity Tolerance: Patient limited by lethargy Patient left: in chair;with call bell/phone within reach;with nursing/sitter in room (w/ RN in room) Nurse Communication: Mobility status;Precautions;Weight bearing status          Time: 9476-5465 PT Time Calculation (min) (ACUTE ONLY): 28 min   Charges:   PT Evaluation $Initial PT Evaluation Tier I: 1 Procedure PT Treatments $Therapeutic Activity: 8-22 mins   PT G Codes:       Joslyn Hy PT, DPT [phone] Pager: [phone] 02/02/2015, 5:04 PM

## 2015-02-02 NOTE — Anesthesia Preprocedure Evaluation (Addendum)
Anesthesia Evaluation  Patient identified by MRN, date of birth, ID band Patient awake    Reviewed: Allergy & Precautions, NPO status , Patient's Chart, lab work & pertinent test results, reviewed documented beta blocker date and time , Unable to perform ROS - Chart review only  Airway Mallampati: II  TM Distance: >3 FB Neck ROM: Full    Dental  (+) Dental Advisory Given   Pulmonary neg pulmonary ROS,  breath sounds clear to auscultation        Cardiovascular hypertension, Pt. on medications and Pt. on home beta blockers + Valvular Problems/Murmurs MR Rhythm:Regular Rate:Normal  01/05/15 Low risk myocardial perfusion study.  NL EF. Moderate MR   Neuro/Psych Anxiety Depression negative neurological ROS     GI/Hepatic Neg liver ROS, GERD-  ,  Endo/Other  diabetes, Type 2Hypothyroidism   Renal/GU CRFRenal disease     Musculoskeletal  (+) Arthritis -,   Abdominal   Peds  Hematology  (+) anemia ,   Anesthesia Other Findings   Reproductive/Obstetrics                            Anesthesia Physical Anesthesia Plan  ASA: III  Anesthesia Plan: General   Post-op Pain Management:    Induction: Intravenous  Airway Management Planned: Oral ETT  Additional Equipment:   Intra-op Plan:   Post-operative Plan: Extubation in OR  Informed Consent: I have reviewed the patients History and Physical, chart, labs and discussed the procedure including the risks, benefits and alternatives for the proposed anesthesia with the patient or authorized representative who has indicated his/her understanding and acceptance.   Dental advisory given  Plan Discussed with: CRNA  Anesthesia Plan Comments:        Anesthesia Quick Evaluation

## 2015-02-02 NOTE — H&P (Signed)
  The recent History & Physical has been reviewed. I have personally examined the patient today. There is no interval change to the documented History & Physical. The patient would like to proceed with the procedure.  [name] W 02/02/2015,  9:35 AM

## 2015-02-02 NOTE — Progress Notes (Signed)
Utilization review completed.  

## 2015-02-02 NOTE — Op Note (Signed)
PATIENT ID:      CHELSA STOUT  MRN:     [id number] DOB/AGE:    June 09, 1951 / 64 y.o.       OPERATIVE REPORT    DATE OF PROCEDURE:  02/02/2015       PREOPERATIVE DIAGNOSIS:   AVASCULAR NECROSIS OF LEFT HIP                                                       Estimated body mass index is 27.11 kg/(m^2) as calculated from the following:   Height as of this encounter: 5\' 3"  (1.6 m).   Weight as of this encounter: 69.4 kg (153 lb).     POSTOPERATIVE DIAGNOSIS:   AVASCULAR NECROSIS OF LEFT HIP                                                                     Estimated body mass index is 27.11 kg/(m^2) as calculated from the following:   Height as of this encounter: 5\' 3"  (1.6 m).   Weight as of this encounter: 69.4 kg (153 lb).     PROCEDURE:  Procedure(s): LEFT TOTAL HIP ARTHROPLASTY     SURGEON:  [name], MD    ASSISTANT:   [name], PA-C   (Present and scrubbed throughout the case, critical for assistance with exposure, retraction, instrumentation, and closure.)          ANESTHESIA: general     DRAINS: none :      TOURNIQUET TIME: * No tourniquets in log *    COMPLICATIONS:  None   CONDITION:  stable  PROCEDURE IN DETAIL: [place], Taiwana Willison W 02/02/2015, 12:24 PM

## 2015-02-02 NOTE — Anesthesia Procedure Notes (Signed)
Procedure Name: Intubation Date/Time: 02/02/2015 10:05 AM Performed by: Susa Loffler Pre-anesthesia Checklist: Patient identified, Timeout performed, Emergency Drugs available, Suction available and Patient being monitored Patient Re-evaluated:Patient Re-evaluated prior to inductionOxygen Delivery Method: Circle system utilized Preoxygenation: Pre-oxygenation with 100% oxygen Intubation Type: IV induction Ventilation: Mask ventilation without difficulty Laryngoscope Size: Mac and 3 Grade View: Grade I Tube type: Oral Tube size: 7.0 mm Number of attempts: 1 Airway Equipment and Method: Stylet Placement Confirmation: ETT inserted through vocal cords under direct vision,  positive ETCO2 and breath sounds checked- equal and bilateral Secured at: 22 cm Tube secured with: Tape Dental Injury: Teeth and Oropharynx as per pre-operative assessment

## 2015-02-03 ENCOUNTER — Encounter (HOSPITAL_COMMUNITY): Payer: Self-pay | Admitting: Orthopaedic Surgery

## 2015-02-03 LAB — BASIC METABOLIC PANEL
Anion gap: 6 (ref 5–15)
BUN: 24 mg/dL — AB (ref 6–20)
CHLORIDE: 101 mmol/L (ref 101–111)
CO2: 26 mmol/L (ref 22–32)
CREATININE: 1.29 mg/dL — AB (ref 0.44–1.00)
Calcium: 8.5 mg/dL — ABNORMAL LOW (ref 8.9–10.3)
GFR, EST AFRICAN AMERICAN: 50 mL/min — AB (ref >60)
GFR, EST NON AFRICAN AMERICAN: 43 mL/min — AB (ref >60)
Glucose, Bld: 123 mg/dL — ABNORMAL HIGH (ref 65–99)
Potassium: 4.1 mmol/L (ref 3.5–5.1)
SODIUM: 133 mmol/L — AB (ref 135–145)

## 2015-02-03 LAB — TYPE AND SCREEN
ABO/RH(D): O POS
ANTIBODY SCREEN: NEGATIVE
UNIT DIVISION: 0
Unit division: 0

## 2015-02-03 LAB — CBC
HCT: 23.9 % — ABNORMAL LOW (ref 36.0–46.0)
HEMOGLOBIN: 8 g/dL — AB (ref 12.0–15.0)
MCH: 27 pg (ref 26.0–34.0)
MCHC: 33.5 g/dL (ref 30.0–36.0)
MCV: 80.7 fL (ref 78.0–100.0)
PLATELETS: 118 10*3/uL — AB (ref 150–400)
RBC: 2.96 MIL/uL — AB (ref 3.87–5.11)
RDW: 15.6 % — ABNORMAL HIGH (ref 11.5–15.5)
WBC: 7.2 10*3/uL (ref 4.0–10.5)

## 2015-02-03 LAB — GLUCOSE, CAPILLARY
GLUCOSE-CAPILLARY: 119 mg/dL — AB (ref 65–99)
GLUCOSE-CAPILLARY: 113 mg/dL — AB (ref 65–99)
Glucose-Capillary: 127 mg/dL — ABNORMAL HIGH (ref 65–99)
Glucose-Capillary: 132 mg/dL — ABNORMAL HIGH (ref 65–99)

## 2015-02-03 LAB — HEMOGLOBIN A1C
HEMOGLOBIN A1C: 6 % — AB (ref 4.8–5.6)
Mean Plasma Glucose: 126 mg/dL

## 2015-02-03 NOTE — Progress Notes (Signed)
Physical Therapy Treatment Patient Details Name: [name] MRN: [id number] DOB: 1950-07-28 Today's Date: 02/03/2015    History of Present Illness Pt is a 64 y.o. F w/ prior h/o Lt hip AVN, now s/p Lt THA.  Pt's PMH includes gout, CKD, anemia, anxiety and depression, HTN, DM.    PT Comments    Increased time for bed mobility and gait this session compared to this AM due to fatigue and patient being sleepy. Patient continued to be motivated but stated, "I just need to move slow". Will attempt steps tomorrow.   Follow Up Recommendations  Home health PT;Supervision/Assistance - 24 hour     Equipment Recommendations  None recommended by PT    Recommendations for Other Services       Precautions / Restrictions Precautions Precautions: Posterior Hip;Fall Precaution Booklet Issued: No Precaution Comments: reviewed hip precautions Required Braces or Orthoses: Knee Immobilizer - Left Knee Immobilizer - Left: Other (comment) (not specified) Restrictions Weight Bearing Restrictions: Yes LLE Weight Bearing: Weight bearing as tolerated    Mobility  Bed Mobility Overal bed mobility: Needs Assistance Bed Mobility: Supine to Sit;Sit to Supine     Supine to sit: Min assist Sit to supine: Min assist   General bed mobility comments: cues for precautions. A for L LE  Transfers Overall transfer level: Needs assistance Equipment used: Rolling walker (2 wheeled) Transfers: Sit to/from Stand Sit to Stand: Min guard         General transfer comment: cues for technique and hand placement  Ambulation/Gait Ambulation/Gait assistance: Min guard Ambulation Distance (Feet): 80 Feet Assistive device: Rolling walker (2 wheeled) Gait Pattern/deviations: Step-through pattern;Decreased stride length Gait velocity: decreased Gait velocity interpretation: Below normal speed for age/gender General Gait Details: Cues for gait sequence and positionoing of RW. Decreased speed compared to  AM   Stairs            Wheelchair Mobility    Modified Rankin (Stroke Patients Only)       Balance                                    Cognition Arousal/Alertness: Awake/alert Behavior During Therapy: WFL for tasks assessed/performed Overall Cognitive Status: Within Functional Limits for tasks assessed                      Exercises Total Joint Exercises Quad Sets: AROM;Both;10 reps Gluteal Sets: AROM;Both;10 reps Heel Slides: AAROM;Left;10 reps Hip ABduction/ADduction: AAROM;Left;10 reps Straight Leg Raises: AAROM;Left;10 reps    General Comments        Pertinent Vitals/Pain Pain Assessment: No/denies pain Pain Score: 2  Pain Location: Lt hip Pain Descriptors / Indicators: Dull Pain Intervention(s): Monitored during session    Home Living Family/patient expects to be discharged to:: Private residence Living Arrangements: Other relatives (sister) Available Help at Discharge: Family;Available 24 hours/day Type of Home: House Home Access: Stairs to enter Entrance Stairs-Rails: Right Home Layout: One level Home Equipment: Walker - 2 wheels;Bedside commode (access to flat shower chair)      Prior Function Level of Independence: Independent with assistive device(s)      Comments: Pt used RW PTA at all times   PT Goals (current goals can now be found in the care plan section) Acute Rehab PT Goals Patient Stated Goal: to usher Progress towards PT goals: Progressing toward goals    Frequency  7X/week    PT  Plan Current plan remains appropriate    Co-evaluation             End of Session Equipment Utilized During Treatment: Gait belt;Left knee immobilizer Activity Tolerance: Patient tolerated treatment well Patient left: with call bell/phone within reach;in bed;with family/visitor present     Time: 5465-6812 PT Time Calculation (min) (ACUTE ONLY): 24 min  Charges:  $Gait Training: 8-22 mins $Therapeutic Exercise:  8-22 mins $Therapeutic Activity: 8-22 mins                    G Codes:      Jacqualyn Posey 02/03/2015, 3:11 PM  02/03/2015 Jacqualyn Posey PTA [phone] pager [phone] office

## 2015-02-03 NOTE — Clinical Documentation Improvement (Signed)
Orthopedic  Can the diagnosis of CKD be further specified?   CKD Stage I - GFR greater than or equal to 90  CKD Stage II - GFR 60-89  CKD Stage III - GFR 30-59  CKD Stage IV - GFR 15-29  CKD Stage V - GFR < 15  ESRD (End Stage Renal Disease)  Other condition  Unable to clinically determine  Supporting Information: : (risk factors, signs and symptoms, diagnostics, treatment)  Black female  GFR's ranging from 60 on 8/11 admission; 50 for the current admit  Please exercise your independent, professional judgment when responding. A specific answer is not anticipated or expected.  Thank You, [place] [phone]

## 2015-02-03 NOTE — Evaluation (Signed)
Occupational Therapy Evaluation Patient Details Name: [name] MRN: [id number] DOB: 09/16/1950 Today's Date: 02/03/2015    History of Present Illness Pt is a 64 y.o. F w/ prior h/o Lt hip AVN, now s/p Lt THA.  Pt's PMH includes gout, CKD, anemia, anxiety and depression, HTN, DM.   Clinical Impression   Pt s/p above. Pt independent with ADLs, PTA. Feel pt will benefit from acute OT to increase independence prior to d/c. Plan to practice more with AE tomorrow and tub transfer.    Follow Up Recommendations  No OT follow up;Supervision - Intermittent    Equipment Recommendations  Other (comment) (AE)    Recommendations for Other Services       Precautions / Restrictions Precautions Precautions: Posterior Hip;Fall Precaution Booklet Issued: No Precaution Comments: reviewed hip precautions Required Braces or Orthoses: Knee Immobilizer - Left Knee Immobilizer - Left: Other (comment) (not specified) Restrictions Weight Bearing Restrictions: Yes LLE Weight Bearing: Weight bearing as tolerated      Mobility Bed Mobility Overal bed mobility: Needs Assistance Bed Mobility: Supine to Sit     Supine to sit: Mod assist     General bed mobility comments: cues for precautions.  Transfers Overall transfer level: Needs assistance Equipment used: Rolling walker (2 wheeled) Transfers: Sit to/from Stand Sit to Stand: Min guard         General transfer comment: cues for technique.     Balance      No LOB in session. Used RW for ambulation and upon standing.                                      ADL Overall ADL's : Needs assistance/impaired                     Lower Body Dressing: Minimal assistance;Moderate assistance;Sit to/from stand;With adaptive equipment   Toilet Transfer: Min guard;Ambulation;RW (sit to stand from bed; walked from bed to chair)           Functional mobility during ADLs: Min guard;Rolling walker (ambulated short  distance and turned to sit in chair) General ADL Comments: Educated on AE/cost/where they can purchase. Educated on LB dressing technique and wrote down to help her and sister remember. Educated on tub transfer techniques. Pt practiced with reacher and sockaid.     Vision     Perception     Praxis      Pertinent Vitals/Pain Pain Assessment: 0-10 Pain Score: 1  Pain Location: Lt hip  Pain Descriptors / Indicators: Dull Pain Intervention(s): Repositioned;Monitored during session     Hand Dominance     Extremity/Trunk Assessment Upper Extremity Assessment Upper Extremity Assessment: Overall WFL for tasks assessed   Lower Extremity Assessment Lower Extremity Assessment: Defer to PT evaluation       Communication Communication Communication: No difficulties   Cognition Arousal/Alertness: Awake/alert Behavior During Therapy: WFL for tasks assessed/performed Overall Cognitive Status: Within Functional Limits for tasks assessed                     General Comments       Exercises       Shoulder Instructions      Home Living Family/patient expects to be discharged to:: Private residence Living Arrangements: Other relatives (sister) Available Help at Discharge: Family;Available 24 hours/day Type of Home: House Home Access: Stairs to enter [employer] of Steps:  5 Entrance Stairs-Rails: Right Home Layout: One level     Bathroom Shower/Tub: Teacher, early years/pre: Standard     Home Equipment: Environmental consultant - 2 wheels;Bedside commode (access to flat shower chair)          Prior Functioning/Environment Level of Independence: Independent with assistive device(s)        Comments: Pt used RW PTA at all times    OT Diagnosis: Acute pain   OT Problem List: Decreased activity tolerance;Decreased knowledge of precautions;Decreased knowledge of use of DME or AE;Pain   OT Treatment/Interventions: Self-care/ADL training;DME and/or AE  instruction;Patient/family education;Therapeutic activities;Balance training    OT Goals(Current goals can be found in the care plan section) Acute Rehab OT Goals Patient Stated Goal: to usher OT Goal Formulation: With patient/family Time For Goal Achievement: 02/10/15 Potential to Achieve Goals: Good ADL Goals Pt Will Perform Lower Body Dressing: with set-up;with supervision;with adaptive equipment;sit to/from stand Pt Will Transfer to Toilet: with supervision;ambulating (3 in 1 over commode) Pt Will Perform Tub/Shower Transfer: Tub transfer;with min guard assist;shower seat;ambulating;rolling walker Additional ADL Goal #1: Pt will independently verbalize 3/3 hip precautions and maintain during session.  OT Frequency: Min 2X/week   Barriers to D/C:            Co-evaluation              End of Session Equipment Utilized During Treatment: Gait belt;Rolling walker  Activity Tolerance: Patient tolerated treatment well Patient left: in chair;with call bell/phone within reach;with family/visitor present   Time: 1128-1150 OT Time Calculation (min): 22 min Charges:  OT General Charges $OT Visit: 1 Procedure OT Evaluation $Initial OT Evaluation Tier I: 1 Procedure G-CodesBenito Mccreedy OTR/L [id number] 02/03/2015, 12:09 PM

## 2015-02-03 NOTE — Plan of Care (Signed)
Problem: Consults Goal: Diagnosis- Total Joint Replacement Outcome: Completed/Met Date Met:  02/03/15 Primary Total Hip Left

## 2015-02-03 NOTE — Care Management Note (Signed)
Case Management Note  Patient Details  Name: [name] MRN: [id number] Date of Birth: 1951/05/20  Subjective/Objective:                 S/p left total hip arthroplasty    Action/Plan: Set up with [facility] for HHPT by MD office.Spoke with patient, no change in discharge plan. Patient stated that she has a rolling walker and 3N1 and her sister will be available to assist her after discharge.     Expected Discharge Date:                  Expected Discharge Plan:  [place]  In-House Referral:  NA  Discharge planning Services  CM Consult  Post Acute Care Choice:  Home Health Choice offered to:  Patient  DME Arranged:    DME Agency:     HH Arranged:  PT HH Agency:  [facility]  Status of Service:  Completed, signed off  Medicare Important Message Given:    Date Medicare IM Given:    Medicare IM give by:    Date Additional Medicare IM Given:    Additional Medicare Important Message give by:     If discussed at [place] of Stay Meetings, dates discussed:    Additional Comments:  Nila Nephew, RN 02/03/2015, 11:19 AM

## 2015-02-03 NOTE — Progress Notes (Signed)
Physical Therapy Treatment Patient Details Name: [name] MRN: [id number] DOB: 01-Dec-1950 Today's Date: 02/03/2015    History of Present Illness Pt is a 64 y.o. F w/ prior h/o Lt hip AVN, now s/p Lt THA.  Pt's PMH includes gout, CKD, anemia, anxiety and depression, HTN, DM.    PT Comments    Patient moving well this AM. Some anxiety due to pain in the past but able to "coach" her out of it some. Continue with current POC  Follow Up Recommendations  Home health PT;Supervision/Assistance - 24 hour     Equipment Recommendations  None recommended by PT    Recommendations for Other Services       Precautions / Restrictions Precautions Precautions: Posterior Hip;Fall Precaution Booklet Issued: No Precaution Comments: reviewed hip precautions Required Braces or Orthoses: Knee Immobilizer - Left Knee Immobilizer - Left: Other (comment) (not specified) Restrictions Weight Bearing Restrictions: Yes LLE Weight Bearing: Weight bearing as tolerated    Mobility  Bed Mobility Overal bed mobility: Needs Assistance Bed Mobility: Supine to Sit     Supine to sit: Min assist     General bed mobility comments: cues for precautions. A for L LE  Transfers Overall transfer level: Needs assistance Equipment used: Rolling walker (2 wheeled) Transfers: Sit to/from Stand Sit to Stand: Min guard         General transfer comment: cues for technique.   Ambulation/Gait Ambulation/Gait assistance: Min guard Ambulation Distance (Feet): 80 Feet Assistive device: Rolling walker (2 wheeled) Gait Pattern/deviations: Step-through pattern;Decreased stride length Gait velocity: decreased Gait velocity interpretation: Below normal speed for age/gender General Gait Details: Cues for gait sequence and positionoing of RW   Stairs            Wheelchair Mobility    Modified Rankin (Stroke Patients Only)       Balance                                    Cognition  Arousal/Alertness: Awake/alert Behavior During Therapy: WFL for tasks assessed/performed Overall Cognitive Status: Within Functional Limits for tasks assessed                      Exercises Total Joint Exercises Quad Sets: AROM;Both;10 reps Gluteal Sets: AROM;Both;10 reps Heel Slides: AAROM;Left;10 reps Hip ABduction/ADduction: AAROM;Left;10 reps Straight Leg Raises: AAROM;Left;10 reps    General Comments        Pertinent Vitals/Pain Pain Assessment: 0-10 Pain Score: 2  Pain Location: Lt hip Pain Descriptors / Indicators: Dull Pain Intervention(s): Monitored during session    Home Living Family/patient expects to be discharged to:: Private residence Living Arrangements: Other relatives (sister) Available Help at Discharge: Family;Available 24 hours/day Type of Home: House Home Access: Stairs to enter Entrance Stairs-Rails: Right Home Layout: One level Home Equipment: Walker - 2 wheels;Bedside commode (access to flat shower chair)      Prior Function Level of Independence: Independent with assistive device(s)      Comments: Pt used RW PTA at all times   PT Goals (current goals can now be found in the care plan section) Acute Rehab PT Goals Patient Stated Goal: to usher Progress towards PT goals: Progressing toward goals    Frequency  7X/week    PT Plan Current plan remains appropriate    Co-evaluation             End of Session Equipment  Utilized During Treatment: Gait belt;Left knee immobilizer Activity Tolerance: Patient tolerated treatment well Patient left: in chair;with call bell/phone within reach     Time: 0950-1018 PT Time Calculation (min) (ACUTE ONLY): 28 min  Charges:  $Gait Training: 8-22 mins $Therapeutic Exercise: 8-22 mins                    G Codes:      Jacqualyn Posey 02/03/2015, 12:12 PM  02/03/2015 Jacqualyn Posey PTA [phone] pager [phone] office

## 2015-02-03 NOTE — Op Note (Signed)
[name], [name] NO.:  [id number]  MEDICAL RECORD NO.:  [id number]  LOCATION:  5N32C                        FACILITY:  [facility]  PHYSICIAN:  Vonna Kotyk. Rosalena Mccorry, M.D.DATE OF BIRTH:  1950-10-17  DATE OF PROCEDURE:  02/02/2015 DATE OF DISCHARGE:                              OPERATIVE REPORT   PREOPERATIVE DIAGNOSIS:  Avascular necrosis, left hip with complete collapse of femoral head.  POSTOPERATIVE DIAGNOSIS:  Avascular necrosis, left hip with complete collapse of femoral head.  PROCEDURE:  Left total hip replacement.  SURGEON:  Vonna Kotyk. Durward Fortes, M.D.  ASSISTANT:  Aaron Edelman D. Petrarca, PA-C.  ANESTHESIA:  General.  COMPLICATIONS:  None.  COMPONENTS:  DePuy AML large stature 15 mm femoral stem, a 36 mm outer diameter hip ball with the -2 mm neck length, a 54 mm outer diameter Sector III Gription metallic acetabular component with a single 6.5 x 25 mm cancellous screw, apex hole eliminator, and a Marathon polyethylene liner +4 with a 10-degree posterior lip.  Components were press-fit.  DESCRIPTION OF PROCEDURE:  [name] was met in the holding area.  I identified the left leg as the appropriate operative site and marked it accordingly.  The patient was then transported to room #7 and placed under general anesthesia without difficulty.  She was then placed in the lateral decubitus position with the left side up and secured to the operating room table with the Innomed hip system.  Time-out was called.  The left lower extremity was then prepped with chlorhexidine scrub and then DuraPrep x2 from the iliac crest to below the knee.  Sterile draping was performed.  A routine Southern incision was utilized via sharp dissection, carried down to subcutaneous tissue.  There was abundant adipose tissue that made the procedure technically difficult.  There was at least 4 to 5 inches of adipose before we visualized the iliotibial band.  Self- retaining retractors  were placed deeply.  IT band was then carefully incised.  There was abundant adipose tissue beneath the IT band and with careful dissection, the short external rotators were identified.  These were incised.  Tendinous structures were tagged with 0-Ethibond suture. Capsule was identified and incised along the femoral neck and head. There was a very small serosanguineous effusion.  After release of the capsule, I could dislocate the head posteriorly. The head was very small, it was not much bigger than a golf ball.  It was osteotomized at the femoral head and neck junction using the calcar guide and the oscillating saw.  The head was then delivered from the wound.  There were other areas of loose bone within the joint, which were removed.  There was abundant capsulitis and a large thickened labrum, which was also carefully excised.  Retractors were then placed along the proximal femur.  Center hole was made for reaming purposes in the piriformis fossa.  Canal finder was inserted and reaming was performed sequentially to a 14.5 to accept a 15 mm component.  Rasping was then performed sequentially from a 12 to 15 mm large stature femoral component with a very nice fit and flush on the calcar after using the calcar reamer.  My cut  was approximately a fingerbreadth proximal to the lesser trochanter.  Retractors were then placed about the acetabulum where the labrum was removed.  There was an abundant capsule, which was somewhat tight, and this was just carefully loosened with a Cobb elevator.  There was significant protrusio.  I then carefully reamed from a 43 to a 53 mm outer diameter reamer.  I had a very nice bleeding about the acetabulum. I trialed a 52 and a 54 component, had a very nice fit with the 54.  I was careful about reaming too deeply.  I did debride any soft tissue from the fovea and then packed it with cancellous bone from the femoral head.  At that point, I impacted the  Sector III Gription metallic acetabular component 54 mm outer diameter, had a very nice fit and to just to supplement the fixation, I used a single 6.5-mm titanium screw, 25 mm in diameter.  The trial polyethylene liner was inserted followed by the 15 mm large stature femoral component.  We trialed 7 head and neck sizes with a 36 mm outer diameter hip ball.  We were actually tight at even the 1.5 mm neck length and therefore finally used the -2 neck length.  This was reduced, any soft tissue was removed from the acetabulum, and then through a full range of motion.  We could not sublux or dislocate the hip, and I thought the leg lengths were symmetrical.  She was approximately a 1/4 or 3/8 of an inch shorter preoperatively.  At that point, the trial components were removed.  We copiously irrigated the joint with saline solution, thought a very good fixation of the acetabulum.  The apex hole eliminator was inserted followed by the Marathon polyethylene liner +4 with a 10-degree posterior lip.  The wound was again irrigated with saline solution.  The large stature 15 mm porous-coated femoral component was then impacted and flushed on the calcar in about 15 degrees of anteversion. The Morse taper neck was cleaned and the 36-mm outer diameter metallic hip ball with a -2 neck length was then inserted.  We inspected the acetabulum was clear, again irrigated and reduced the hip through a full range of motion and perfectly stable.  We infiltrated the capsule with 0.25% Marcaine without epinephrine.  I did palpate the sciatic nerve throughout the procedure and thought it was well protected and out of harm's way.  The capsule was closed anatomically with #1 Ethibond.  Short external rotators were closed with the same material.  The IT band was closed with a running #1 Vicryl with subcu closed with 2-0 Vicryl and 3-0 Monocryl.  Skin closed with skin clips.  Sterile bulky dressing was applied.   The patient was then placed in the supine position.  A knee immobilizer applied with ice pack to the hip.  Nursing staff performed an in and out catheterization.  The patient did receive 2 units of packed cells during the procedure. She had constant oozing around her wound.  She has been on aspirin. Total blood loss was approximately 1200 mL.  The patient did tolerate the procedure without complications.    Vonna Kotyk. Durward Fortes, M.D.    PWW/MEDQ  D:  02/02/2015  T:  02/03/2015  Job:  [id number]

## 2015-02-03 NOTE — Progress Notes (Signed)
Patient ID: [name], female   DOB: [birth year], 64 y.o.   MRN: [id number] PATIENT ID: [name]        MRN:  [id number]          DOB/AGE: January 27, 1951 / 64 y.o.    Angela Fears, MD   Angela Borg, PA-C [address] [place], [place]  [address]                             [phone]   PROGRESS NOTE  Subjective:  positive for Chest Pain but similar to preop  negative for Shortness of Breath  negative for Nausea/Vomiting   negative for Calf Pain    Tolerating Diet: yes         Patient reports pain as moderate.     Pain meds are helping  Objective: Vital signs in last 24 hours:   Patient Vitals for the past 24 hrs:  BP Temp Temp src Pulse Resp SpO2 Height Weight  02/03/15 0652 132/62 mmHg 98.2 F (36.8 C) Oral 77 17 100 % - -  02/02/15 2105 134/68 mmHg 98.4 F (36.9 C) Oral 68 17 100 % - -  02/02/15 1545 (!) 170/78 mmHg - - 70 16 100 % - -  02/02/15 1438 - 97.5 F (36.4 C) - - - - - -  02/02/15 1424 130/80 mmHg - - 63 13 95 % - -  02/02/15 1409 (!) 148/78 mmHg - - 65 (!) 8 98 % - -  02/02/15 1339 (!) 183/90 mmHg - - 70 17 100 % - -  02/02/15 1324 (!) 177/96 mmHg - - 70 13 100 % - -  02/02/15 1314 (!) 175/88 mmHg - - 71 18 100 % - -  02/02/15 1254 (!) 174/95 mmHg - - 72 13 100 % - -  02/02/15 1240 - 97.4 F (36.3 C) - - - - - -  02/02/15 1239 (!) 170/93 mmHg - - (!) 44 15 100 % - -  02/02/15 0829 (!) 124/51 mmHg 97.5 F (36.4 C) - 62 18 100 % 5\' 3"  (1.6 m) 69.4 kg (153 lb)      Intake/Output from previous day:   08/23 0701 - 08/24 0700 In: 3806.3 [P.O.:360; I.V.:2581.3] Out: 1250    Intake/Output this shift:       Intake/Output      08/23 0701 - 08/24 0700 08/24 0701 - 08/25 0700   P.O. 360    I.V. (mL/kg) 2581.3 (37.2)    Blood 615    IV Piggyback 250    Total Intake(mL/kg) 3806.3 (54.8)    Blood 1250    Total Output 1250     Net +2556.3          Urine Occurrence 6 x       LABORATORY DATA:  Recent Labs  02/02/15 1139 02/02/15 1230  02/03/15 0410  WBC  --   --  7.2  HGB 11.2* 11.6* 8.0*  HCT 33.0* 34.0* 23.9*  PLT  --   --  118*    Recent Labs  02/02/15 1139 02/02/15 1230 02/03/15 0410  NA 135 137 133*  K 4.5 4.5 4.1  CL  --   --  101  CO2  --   --  26  BUN  --   --  24*  CREATININE  --   --  1.29*  GLUCOSE 186* 173* 123*  CALCIUM  --   --  8.5*   Lab Results  Component Value Date   INR 1.02 01/21/2015   INR 1.03 12/25/2014    Recent Radiographic Studies :  Dg Hip Unilat With Pelvis 2-3 Views Left  02/02/2015   CLINICAL DATA:  Postop hip replacement.  EXAM: DG HIP (WITH OR WITHOUT PELVIS) 2-3V LEFT  COMPARISON:  02/03/2013  FINDINGS: Examination demonstrates a left total hip arthroplasty intact and normally located. There are minimal degenerative changes of the right hip. There is mild flattening of the right femoral head with mild heterogeneous sclerosis of the femoral head as cannot exclude early avascular necrosis. Multiple surgical clips over the left lower abdomen likely due to hernia repair. Remainder of the exam is unchanged.  IMPRESSION: Evidence of total left hip arthroplasty which is intact.  Mild degenerate change of the right hip with suggestion of mild flattening of the femoral head which could be seen due to early avascular necrosis. Recommend clinical correlation as consider MRI for further evaluation.   Electronically Signed   By: Marin Olp M.D.   On: 02/02/2015 14:12     Examination:  General appearance: alert, cooperative, mild distress and moderate distress Resp: clear to auscultation bilaterally Cardio: regular rate and rhythm GI: soft, non-tender; bowel sounds normal; no masses,  no organomegaly  Wound Exam: dressing intact  Drainage:  Scant/small amount Bloody exudate  Motor Exam: EHL, FHL, Anterior Tibial and Posterior Tibial Intact  Sensory Exam: Superficial Peroneal, Deep Peroneal and Tibial normal  Vascular Exam: Left posterior tibial artery has trace  pulse  Assessment:    1 Day Post-Op  Procedure(s) (LRB): LEFT TOTAL HIP ARTHROPLASTY (Left)  ADDITIONAL DIAGNOSIS:  Principal Problem:   Avascular necrosis of left femoral head Active Problems:   Obesity   S/P total hip arthroplasty  Acute Blood Loss Anemia and Diabetes   Plan: Physical Therapy as ordered Weight Bearing as Tolerated (WBAT)  DVT Prophylaxis:  Xarelto, Foot Pumps and TED hose  DISCHARGE PLAN: Home  DISCHARGE NEEDS: HHPT, Walker and 3-in-1 comode seat        PETRARCA,BRIAN  02/03/2015 8:14 AM

## 2015-02-04 LAB — BASIC METABOLIC PANEL
ANION GAP: 8 (ref 5–15)
BUN: 20 mg/dL (ref 6–20)
CALCIUM: 8.7 mg/dL — AB (ref 8.9–10.3)
CO2: 25 mmol/L (ref 22–32)
Chloride: 105 mmol/L (ref 101–111)
Creatinine, Ser: 1.16 mg/dL — ABNORMAL HIGH (ref 0.44–1.00)
GFR calc Af Amer: 57 mL/min — ABNORMAL LOW (ref >60)
GFR calc non Af Amer: 49 mL/min — ABNORMAL LOW (ref >60)
GLUCOSE: 123 mg/dL — AB (ref 65–99)
Potassium: 3.8 mmol/L (ref 3.5–5.1)
Sodium: 138 mmol/L (ref 135–145)

## 2015-02-04 LAB — GLUCOSE, CAPILLARY
GLUCOSE-CAPILLARY: 117 mg/dL — AB (ref 65–99)
Glucose-Capillary: 118 mg/dL — ABNORMAL HIGH (ref 65–99)
Glucose-Capillary: 130 mg/dL — ABNORMAL HIGH (ref 65–99)
Glucose-Capillary: 131 mg/dL — ABNORMAL HIGH (ref 65–99)

## 2015-02-04 LAB — CBC
HEMATOCRIT: 21.9 % — AB (ref 36.0–46.0)
Hemoglobin: 7.3 g/dL — ABNORMAL LOW (ref 12.0–15.0)
MCH: 27 pg (ref 26.0–34.0)
MCHC: 33.3 g/dL (ref 30.0–36.0)
MCV: 81.1 fL (ref 78.0–100.0)
Platelets: 112 10*3/uL — ABNORMAL LOW (ref 150–400)
RBC: 2.7 MIL/uL — ABNORMAL LOW (ref 3.87–5.11)
RDW: 15.9 % — AB (ref 11.5–15.5)
WBC: 5.9 10*3/uL (ref 4.0–10.5)

## 2015-02-04 MED ORDER — RIVAROXABAN 10 MG PO TABS: 10.0000 mg | ORAL_TABLET | Freq: Every day | ORAL | Status: DC

## 2015-02-04 MED ORDER — METHOCARBAMOL 500 MG PO TABS: 500.0000 mg | ORAL_TABLET | Freq: Three times a day (TID) | ORAL | Status: DC | PRN

## 2015-02-04 MED ORDER — ACETAMINOPHEN 325 MG PO TABS
650.0000 mg | ORAL_TABLET | Freq: Four times a day (QID) | ORAL | Status: DC | PRN
  Administered 2015-02-04 – 2015-02-05 (×3): 650 mg via ORAL
  Filled 2015-02-04 (×3): qty 2

## 2015-02-04 MED ORDER — HYDROCODONE-ACETAMINOPHEN 10-325 MG PO TABS: 1.0000 | ORAL_TABLET | ORAL | Status: DC | PRN

## 2015-02-04 NOTE — Progress Notes (Signed)
Physical Therapy Treatment Patient Details Name: [name] MRN: [id number] DOB: 27-Sep-1950 Today's Date: 02/04/2015    History of Present Illness Pt is a 64 y.o. F w/ prior h/o Lt hip AVN, now s/p Lt THA.  Pt's PMH includes gout, CKD, anemia, anxiety and depression, HTN, DM.    PT Comments    Patient continues to feel very weak with increase pain which is hindering mobility. Patient refusing SNF and prefers to go home with assistance from family. At this time patient has been unable to attempt steps due to weakness and pain. Attempted in earlier session. Patient stated that she is willing to pay for ambulance transport home if unable to attempt steps tomorrow. Case manager is aware. RN also made aware of session and paged to inform PA. Will continue with current POC tomorrow. Sister present in room throughout session.   Follow Up Recommendations  Home health PT;Supervision/Assistance - 24 hour     Equipment Recommendations  None recommended by PT    Recommendations for Other Services       Precautions / Restrictions Precautions Precautions: Posterior Hip;Fall Precaution Comments: reviewed hip precautions, pt able to independently verbalize 2/3 Required Braces or Orthoses: Knee Immobilizer - Left Restrictions LLE Weight Bearing: Weight bearing as tolerated    Mobility  Bed Mobility               General bed mobility comments: In recliner before and after session  Transfers Overall transfer level: Needs assistance Equipment used: Rolling walker (2 wheeled)   Sit to Stand: Min assist         General transfer comment: cues for technique and hand placement and LLE positioning  Ambulation/Gait Ambulation/Gait assistance: Min assist Ambulation Distance (Feet): 25 Feet Assistive device: Rolling walker (2 wheeled) Gait Pattern/deviations: Step-to pattern;Decreased step length - right;Decreased step length - left;Decreased stance time - left;Antalgic Gait velocity:  very decreased   General Gait Details: Cues for gait sequence and positionoing of RW. Decreased speed compared to AM. Very limited gait due to fatigue and patient feeling weak   Stairs Stairs: Yes       General stair comments: unable to attempt  Wheelchair Mobility    Modified Rankin (Stroke Patients Only)       Balance                                    Cognition Arousal/Alertness: Awake/alert Behavior During Therapy: WFL for tasks assessed/performed Overall Cognitive Status: Within Functional Limits for tasks assessed                      Exercises      General Comments        Pertinent Vitals/Pain Pain Score: 10-Worst pain ever Faces Pain Scale: Hurts little more Pain Location: L hip Pain Descriptors / Indicators: Aching;Sore Pain Intervention(s): Limited activity within patient's tolerance;Premedicated before session    Home Living                      Prior Function            PT Goals (current goals can now be found in the care plan section) Progress towards PT goals: Not progressing toward goals - comment    Frequency  7X/week    PT Plan Current plan remains appropriate    Co-evaluation  End of Session Equipment Utilized During Treatment: Gait belt Activity Tolerance: Patient limited by fatigue Patient left: in chair;with call bell/phone within reach;with family/visitor present     Time: 2992-4268 PT Time Calculation (min) (ACUTE ONLY): 40 min  Charges:  $Gait Training: 23-37 mins $Therapeutic Activity: 8-22 mins                    G Codes:      Jacqualyn Posey 02/04/2015, 3:12 PM 02/04/2015 Jacqualyn Posey PTA [phone] pager [phone] office

## 2015-02-04 NOTE — Progress Notes (Signed)
Occupational Therapy Treatment Patient Details Name: [name] MRN: [id number] DOB: Mar 10, 1951 Today's Date: 02/04/2015    History of present illness Pt is a 64 y.o. F w/ prior h/o Lt hip AVN, now s/p Lt THA.  Pt's PMH includes gout, CKD, anemia, anxiety and depression, HTN, DM.   OT comments  Patient making slow progress towards goals. Pt seemed okay upon OT entering room, the more patient worked and did, she became increasingly fatigued. Pt barely able to hold eyes open by the end of the session. Pt unable to tolerate entire planned session, therefore therapist thoroughly educated pt and sister on tub/shower transfer using 3-in-1 since pt physically unable to perform. As stated below, at this time recommending [place], but depending on progress patient may need SNF placement prior to d/c>home. Pt lives with sister, but sister reports a bad back and unable to physically assist more than a min guard level. Pt's hemoglobin 7.3 as of early this am and patient initially not symptomatic, but became increasingly fatigued during session. Pt unsafe to d/c home today.    Follow Up Recommendations  Home health OT;Supervision/Assistance - 24 hour (depending on progress, may need SNF placement)    Equipment Recommendations  Other (comment) (AE-reacher, sock aid, LH sponge, LH shoe horn)    Recommendations for Other Services  None at this time   Precautions / Restrictions Precautions Precautions: Posterior Hip;Fall Precaution Comments: reviewed hip precautions, pt able to independently verbalize 2/3 Required Braces or Orthoses: Knee Immobilizer - Left Knee Immobilizer - Left: Other (comment) (not specified) Restrictions Weight Bearing Restrictions: Yes LLE Weight Bearing: Weight bearing as tolerated    Mobility Bed Mobility Overal bed mobility: Needs Assistance Bed Mobility: Supine to Sit     Supine to sit: Mod assist     General bed mobility comments: Assistance for LLE and assist for trunk  support. Cues to maintain hip precautions.   Transfers Overall transfer level: Needs assistance Equipment used: Rolling walker (2 wheeled) Transfers: Sit to/from Stand Sit to Stand: Min assist General transfer comment: cues for technique and hand placement    Balance Overall balance assessment: Needs assistance Sitting-balance support: No upper extremity supported;Feet supported Sitting balance-Leahy Scale: Fair     Standing balance support: Bilateral upper extremity supported;During functional activity Standing balance-Leahy Scale: Fair   ADL Overall ADL's : Needs assistance/impaired General ADL Comments: Pt takes increased time. Pt engaged in LB dressing using AE to increase independence. Pt with decreased endurance and unable to perform tub/shower transfer, therapist did throughly educate pt and sister on transfer in/out of tub/shower using 3-in-1 while adhereing to hip precautions.      Cognition   Behavior During Therapy: WFL for tasks assessed/performed Overall Cognitive Status: Within Functional Limits for tasks assessed                 Pertinent Vitals/ Pain       Pain Assessment: Faces Faces Pain Scale: Hurts little more Pain Location: left hip Pain Descriptors / Indicators: Sore;Grimacing;Dull Pain Intervention(s): Monitored during session   Frequency Min 2X/week     Progress Toward Goals  OT Goals(current goals can now befound in the care plan section)  Progress towards OT goals: Progressing toward goals     Plan Discharge plan needs to be updated    End of Session Equipment Utilized During Treatment: Rolling walker   Activity Tolerance Patient tolerated treatment well   Patient Left in chair (in therapy gym with PT)   Time: 9485-4627 OT  Time Calculation (min): 27 min  Charges: OT General Charges $OT Visit: 1 Procedure  [name] , MS, OTR/L, CLT Pager: [phone]  02/04/2015, 10:56 AM

## 2015-02-04 NOTE — Progress Notes (Signed)
Patient ID: [name], female   DOB: 15-Nov-1950, 64 y.o.   MRN: [id number] PATIENT ID: [name]        MRN:  [id number]          DOB/AGE: 1951-04-09 / 64 y.o.    [name], MD   [name], PA-C [address] [place], [place]  [address]                             [phone]   PROGRESS NOTE  Subjective:  negative for Chest Pain  negative for Shortness of Breath  negative for Nausea/Vomiting   negative for Calf Pain    Tolerating Diet: yes         Patient reports pain as mild.     EKG OK with little change from 01/05/15-T wave abnormality as before but no ischemic changes VS stable Objective: Vital signs in last 24 hours:   Patient Vitals for the past 24 hrs:  BP Temp Temp src Pulse Resp SpO2  02/04/15 0538 (!) 114/48 mmHg 99.8 F (37.7 C) - 88 18 100 %  02/03/15 2031 (!) 159/62 mmHg 99.1 F (37.3 C) - 87 18 99 %  02/03/15 1440 (!) 111/54 mmHg 97.8 F (36.6 C) Oral 69 17 95 %      Intake/Output from previous day:   08/24 0701 - 08/25 0700 In: 720 [P.O.:720] Out: 1 [Urine:1]   Intake/Output this shift:       Intake/Output      08/24 0701 - 08/25 0700 08/25 0701 - 08/26 0700   P.O. 720    I.V. (mL/kg)     Blood     IV Piggyback     Total Intake(mL/kg) 720 (10.4)    Urine (mL/kg/hr) 1 (0)    Blood     Total Output 1     Net +719          Urine Occurrence 3 x       LABORATORY DATA:  Recent Labs  02/02/15 1139 02/02/15 1230 02/03/15 0410 02/04/15 0430  WBC  --   --  7.2 5.9  HGB 11.2* 11.6* 8.0* 7.3*  HCT 33.0* 34.0* 23.9* 21.9*  PLT  --   --  118* 112*    Recent Labs  02/02/15 1139 02/02/15 1230 02/03/15 0410 02/04/15 0430  NA 135 137 133* 138  K 4.5 4.5 4.1 3.8  CL  --   --  101 105  CO2  --   --  26 25  BUN  --   --  24* 20  CREATININE  --   --  1.29* 1.16*  GLUCOSE 186* 173* 123* 123*  CALCIUM  --   --  8.5* 8.7*   Lab Results  Component Value Date   INR 1.02 01/21/2015   INR 1.03 12/25/2014    Recent  Radiographic Studies :  Dg Hip Unilat With Pelvis 2-3 Views Left  02/02/2015   CLINICAL DATA:  Postop hip replacement.  EXAM: DG HIP (WITH OR WITHOUT PELVIS) 2-3V LEFT  COMPARISON:  02/03/2013  FINDINGS: Examination demonstrates a left total hip arthroplasty intact and normally located. There are minimal degenerative changes of the right hip. There is mild flattening of the right femoral head with mild heterogeneous sclerosis of the femoral head as cannot exclude early avascular necrosis. Multiple surgical clips over the left lower abdomen likely due to hernia repair. Remainder of the exam is unchanged.  IMPRESSION: Evidence of total left hip arthroplasty which is intact.  Mild degenerate change of the right hip with suggestion of mild flattening of the femoral head which could be seen due to early avascular necrosis. Recommend clinical correlation as consider MRI for further evaluation.   Electronically Signed   By: Marin Olp M.D.   On: 02/02/2015 14:12     Examination:  General appearance: alert, cooperative and no distress  Wound Exam: clean, dry, intact   Drainage:  None: wound tissue dry  Motor Exam: EHL, FHL, Anterior Tibial and Posterior Tibial Intact  Sensory Exam: Superficial Peroneal, Deep Peroneal and Tibial normal  Vascular Exam: Normal  Assessment:    2 Days Post-Op  Procedure(s) (LRB): LEFT TOTAL HIP ARTHROPLASTY (Left)  ADDITIONAL DIAGNOSIS:  Principal Problem:   Avascular necrosis of left femoral head Active Problems:   Obesity   S/P total hip arthroplasty  Acute Blood Loss Anemia-asymptomatic, received 2 units packed cells in OR, oozed throughout procedure possibly related to ASA   Plan: Physical Therapy as ordered Weight Bearing as Tolerated (WBAT)  DVT Prophylaxis:  Lovenox, Foot Pumps and TED hose  DISCHARGE PLAN: Home  DISCHARGE NEEDS: HHPT, Walker and 3-in-1 comode seat    Dressing changed, plan on D/C today after PT with F/U in [place]-pt comfortable  without problems    Garald Balding  02/04/2015 8:38 AM

## 2015-02-04 NOTE — Progress Notes (Signed)
Physical Therapy Treatment Patient Details Name: [name] MRN: [id number] DOB: 02/25/1951 Today's Date: 02/04/2015    History of Present Illness Pt is a 64 y.o. F w/ prior h/o Lt hip AVN, now s/p Lt THA.  Pt's PMH includes gout, CKD, anemia, anxiety and depression, HTN, DM.    PT Comments    Patient unable to complete steps today and not safe to DC home at this time. Patient limited with ambulation due to fatigue and pain. If she does not progress with therapy over next day will need SNF for ongoing therapy. RN, OT and Case Manager met to discuss. WIll see again this afternoon.   Follow Up Recommendations  Home health PT;Supervision/Assistance - 24 hour     Equipment Recommendations  None recommended by PT    Recommendations for Other Services       Precautions / Restrictions Precautions Precautions: Posterior Hip;Fall Precaution Comments: reviewed hip precautions, pt able to independently verbalize 2/3 Required Braces or Orthoses: Knee Immobilizer - Left Knee Immobilizer - Left: Other (comment) (not specified) Restrictions Weight Bearing Restrictions: Yes LLE Weight Bearing: Weight bearing as tolerated    Mobility  Bed Mobility Overal bed mobility: Needs Assistance Bed Mobility: Supine to Sit     Supine to sit: Mod assist     General bed mobility comments: Up with OT. See previous OT note  Transfers Overall transfer level: Needs assistance Equipment used: Rolling walker (2 wheeled) Transfers: Sit to/from Stand Sit to Stand: Min assist         General transfer comment: cues for technique and hand placement and LLE positioning  Ambulation/Gait Ambulation/Gait assistance: Min assist Ambulation Distance (Feet): 5 Feet Assistive device: Rolling walker (2 wheeled)   Gait velocity: decreased   General Gait Details: Cues for gait sequence and positionoing of RW. Decreased speed compared to AM. Very limited gait due to fatigue and patient not feeling  well.   Stairs Stairs: Yes       General stair comments: Patient got up to steps and held to rail but unable to attempt due to fatique  Wheelchair Mobility    Modified Rankin (Stroke Patients Only)       Balance Overall balance assessment: Needs assistance Sitting-balance support: No upper extremity supported;Feet supported Sitting balance-Leahy Scale: Fair     Standing balance support: Bilateral upper extremity supported;During functional activity Standing balance-Leahy Scale: Fair                      Cognition Arousal/Alertness: Lethargic Behavior During Therapy: WFL for tasks assessed/performed Overall Cognitive Status: Within Functional Limits for tasks assessed                      Exercises      General Comments        Pertinent Vitals/Pain Pain Assessment: Faces Faces Pain Scale: Hurts little more Pain Location: Left hip Pain Descriptors / Indicators: Aching;Sore Pain Intervention(s): Monitored during session    Home Living                      Prior Function            PT Goals (current goals can now be found in the care plan section) Progress towards PT goals: Progressing toward goals    Frequency  7X/week    PT Plan Current plan remains appropriate    Co-evaluation  End of Session Equipment Utilized During Treatment: Gait belt Activity Tolerance: Patient limited by fatigue Patient left: in chair;with call bell/phone within reach;with nursing/sitter in room     Time: 7356-7014 PT Time Calculation (min) (ACUTE ONLY): 27 min  Charges:  $Gait Training: 8-22 mins $Therapeutic Activity: 8-22 mins                    G Codes:      Jacqualyn Posey 02/04/2015, 11:44 AM 02/04/2015 Jacqualyn Posey PTA [phone] pager [phone] office

## 2015-02-04 NOTE — Anesthesia Postprocedure Evaluation (Signed)
  Anesthesia Post-op Note  Patient: [name]  Procedure(s) Performed: Procedure(s): LEFT TOTAL HIP ARTHROPLASTY (Left)  Patient Location: PACU  Anesthesia Type:General  Level of Consciousness: awake and alert   Airway and Oxygen Therapy: Patient Spontanous Breathing  Post-op Pain: mild  Post-op Assessment: Post-op Vital signs reviewed LLE Motor Response: Purposeful movement LLE Sensation: No numbness          Post-op Vital Signs: Reviewed  Last Vitals:  Filed Vitals:   02/04/15 0538  BP: 114/48  Pulse: 88  Temp: 37.7 C  Resp: 18    Complications: No apparent anesthesia complications

## 2015-02-04 NOTE — Discharge Summary (Signed)
[name], MD   [name], PA-C [address], [place], [place]  [address]                             [phone]  PATIENT ID: [name]        MRN:  [id number]          DOB/AGE: [birth year] / 64 y.o.    DISCHARGE SUMMARY  ADMISSION DATE:    02/02/2015 DISCHARGE DATE:   02/05/2015   ADMISSION DIAGNOSIS: AVASCULAR NECROSIS OF LEFT HIP    DISCHARGE DIAGNOSIS:  AVASCULAR NECROSIS OF LEFT HIP    ADDITIONAL DIAGNOSIS: Principal Problem:   Avascular necrosis of left femoral head Active Problems:   Obesity   S/P total hip arthroplasty  Past Medical History  Diagnosis Date  . Gout   . Chronic kidney disease   . Anemia   . GERD (gastroesophageal reflux disease)   . Arthritis   . Stevens-Johnson disease   . Moderate mitral insufficiency     a. 12/2014 Echo: EF 55-60%, mod MR, mildly to moderately dil LA, PASP 52mmHg.  [name] Shortness of breath dyspnea     ALbuterol daily as needed  . Anxiety     takes Xanax daily   . Depression     takes Celexa daily  . Hypothyroidism     takes Synthroid daily  . Hypertension     takes Catapres and Quinapril daily  . Diabetes mellitus without complication     takes Actos daily  . History of blood transfusion     no abnormal reaction noted    PROCEDURE: Procedure(s): LEFT TOTAL HIP ARTHROPLASTY on 02/02/2015  CONSULTS: none     HISTORY: [name] is a 64 year old African American female who is seen today for evaluation of her left hip and groin pain. She states that she has had these symptoms for some time and has now gotten to the point where she is having problems with her activities of daily living. She does have pain with every step as well as nighttime pain. She is having difficulty with ambulation. She finds herself again having problems with weightbearing as well as problems with activities of daily living. The nighttime pain is bothering her even more  HOSPITAL COURSE:  JAANA BRODT is a 64 y.o. admitted on  02/02/2015 and found to have a diagnosis of [place].  After appropriate laboratory studies were obtained  they were taken to the operating room on 02/02/2015 and underwent  Procedure(s): LEFT TOTAL HIP ARTHROPLASTY  .   They were given perioperative antibiotics:  Anti-infectives    Start     Dose/Rate Route Frequency Ordered Stop   02/02/15 2200  vancomycin (VANCOCIN) IVPB 1000 mg/200 mL premix     1,000 mg 200 mL/hr over 60 Minutes Intravenous Every 12 hours 02/02/15 1551 02/02/15 2205   02/02/15 1030  vancomycin (VANCOCIN) IVPB 1000 mg/200 mL premix     1,000 mg 200 mL/hr over 60 Minutes Intravenous To Surgery 02/02/15 1014 02/02/15 1115   02/02/15 0945  ceFAZolin (ANCEF) IVPB 2 g/50 mL premix  Status:  Discontinued     2 g 100 mL/hr over 30 Minutes Intravenous To ShortStay Surgical 02/01/15 1314 02/02/15 1456    .  Tolerated the procedure well.  Toradol was given post op.  POD #1, allowed out of bed to a chair.  PT for ambulation and exercise program.  IV saline locked.  O2 discontionued.  POD #2, continued PT and ambulation.   Dressing changed.  Wound clean and dry.  Unfortunately she did poorly with PT and they suggested SNF.  Consult to Social Service was initiated.  POD #3, continued with PT.  She improved and desired discharge to home . The remainder of the hospital course was dedicated to ambulation and strengthening.   The patient was discharged on 3 Days Post-Op in  Stable condition.  Blood products given:2 units intraop PRBC  DIAGNOSTIC STUDIES: Recent vital signs:  Patient Vitals for the past 24 hrs:  BP Temp Pulse Resp SpO2  02/05/15 0612 (!) 133/52 mmHg 99.2 F (37.3 C) 79 18 100 %  02/04/15 1946 (!) 124/56 mmHg (!) 100.5 F (38.1 C) 93 18 98 %  02/04/15 1852 (!) 130/53 mmHg - 96 - -  02/04/15 1830 - 99.5 F (37.5 C) - - -  02/04/15 1300 (!) 143/56 mmHg 100.1 F (37.8 C) 87 18 100 %       Recent laboratory studies:  Recent Labs   02/02/15 1139 02/02/15 1230 02/03/15 0410 02/04/15 0430 02/05/15 0400  WBC  --   --  7.2 5.9 7.9  HGB 11.2* 11.6* 8.0* 7.3* 7.4*  HCT 33.0* 34.0* 23.9* 21.9* 22.5*  PLT  --   --  118* 112* 117*    Recent Labs  02/02/15 1139 02/02/15 1230 02/03/15 0410 02/04/15 0430 02/05/15 0400  NA 135 137 133* 138 136  K 4.5 4.5 4.1 3.8 4.0  CL  --   --  101 105 102  CO2  --   --  26 25 28   BUN  --   --  24* 20 17  CREATININE  --   --  1.29* 1.16* 1.24*  GLUCOSE 186* 173* 123* 123* 137*  CALCIUM  --   --  8.5* 8.7* 8.7*   Lab Results  Component Value Date   INR 1.02 01/21/2015   INR 1.03 12/25/2014     Recent Radiographic Studies :  Dg Hip Unilat With Pelvis 2-3 Views Left  02/02/2015   CLINICAL DATA:  Postop hip replacement.  EXAM: DG HIP (WITH OR WITHOUT PELVIS) 2-3V LEFT  COMPARISON:  02/03/2013  FINDINGS: Examination demonstrates a left total hip arthroplasty intact and normally located. There are minimal degenerative changes of the right hip. There is mild flattening of the right femoral head with mild heterogeneous sclerosis of the femoral head as cannot exclude early avascular necrosis. Multiple surgical clips over the left lower abdomen likely due to hernia repair. Remainder of the exam is unchanged.  IMPRESSION: Evidence of total left hip arthroplasty which is intact.  Mild degenerate change of the right hip with suggestion of mild flattening of the femoral head which could be seen due to early avascular necrosis. Recommend clinical correlation as consider MRI for further evaluation.   Electronically Signed   By: Marin Olp M.D.   On: 02/02/2015 14:12    DISCHARGE INSTRUCTIONS:     Discharge Instructions    Call MD / Call 911    Complete by:  As directed   If you experience chest pain or shortness of breath, CALL 911 and be transported to the hospital emergency room.  If you develope a fever above 101 F, pus (white drainage) or increased drainage or redness at the wound, or  calf pain, call your surgeon's office.     Change dressing    Complete by:  As directed  DO NOT CHANGE THE DRESSING     Constipation Prevention    Complete by:  As directed   Drink plenty of fluids.  Prune juice may be helpful.  You may use a stool softener, such as Colace (over the counter) 100 mg twice a day.  Use MiraLax (over the counter) for constipation as needed.     Diet Carb Modified    Complete by:  As directed      Discharge instructions    Complete by:  As directed   INSTRUCTIONS AFTER JOINT REPLACEMENT   Remove items at home which could result in a fall. This includes throw rugs or furniture in walking pathways ICE to the affected joint every three hours while awake for 30 minutes at a time, for at least the first 3-5 days, and then as needed for pain and swelling.  Continue to use ice for pain and swelling. You may notice swelling that will progress down to the foot and ankle.  This is normal after surgery.  Elevate your leg when you are not up walking on it.   Continue to use the breathing machine you got in the hospital (incentive spirometer) which will help keep your temperature down.  It is common for your temperature to cycle up and down following surgery, especially at night when you are not up moving around and exerting yourself.  The breathing machine keeps your lungs expanded and your temperature down.   DIET:  As you were doing prior to hospitalization, we recommend a well-balanced diet.  DRESSING / WOUND CARE / SHOWERING  Keep the surgical dressing until follow up.  The dressing is water proof, so you can shower without any extra covering.  IF THE DRESSING FALLS OFF or the wound gets wet inside, change the dressing with sterile gauze.  Please use good hand washing techniques before changing the dressing.  Do not use any lotions or creams on the incision until instructed by your surgeon.    ACTIVITY  Increase activity slowly as tolerated, but follow the weight  bearing instructions below.   No driving for 6 weeks or until further direction given by your physician.  You cannot drive while taking narcotics.  No lifting or carrying greater than 10 lbs. until further directed by your surgeon. Avoid periods of inactivity such as sitting longer than an hour when not asleep. This helps prevent blood clots.  You may return to work once you are authorized by your doctor.     WEIGHT BEARING   Weight bearing as tolerated with assist device (walker, cane, etc) as directed, use it as long as suggested by your surgeon or therapist, typically at least 4-6 weeks.   EXERCISES  Results after joint replacement surgery are often greatly improved when you follow the exercise, range of motion and muscle strengthening exercises prescribed by your doctor. Safety measures are also important to protect the joint from further injury. Any time any of these exercises cause you to have increased pain or swelling, decrease what you are doing until you are comfortable again and then slowly increase them. If you have problems or questions, call your caregiver or physical therapist for advice.   Rehabilitation is important following a joint replacement. After just a few days of immobilization, the muscles of the leg can become weakened and shrink (atrophy).  These exercises are designed to build up the tone and strength of the thigh and leg muscles and to improve motion. Often times heat used for twenty  to thirty minutes before working out will loosen up your tissues and help with improving the range of motion but do not use heat for the first two weeks following surgery (sometimes heat can increase post-operative swelling).   These exercises can be done on a training (exercise) mat, on the floor, on a table or on a bed. Use whatever works the best and is most comfortable for you.    Use music or television while you are exercising so that the exercises are a pleasant break in your day.  This will make your life better with the exercises acting as a break in your routine that you can look forward to.   Perform all exercises about fifteen times, three times per day or as directed.  You should exercise both the operative leg and the other leg as well.   Exercises include:   Quad Sets - Tighten up the muscle on the front of the thigh (Quad) and hold for 5-10 seconds.   Straight Leg Raises - With your knee straight (if you were given a brace, keep it on), lift the leg to 60 degrees, hold for 3 seconds, and slowly lower the leg.  Perform this exercise against resistance later as your leg gets stronger.  Leg Slides: Lying on your back, slowly slide your foot toward your buttocks, bending your knee up off the floor (only go as far as is comfortable). Then slowly slide your foot back down until your leg is flat on the floor again.  Angel Wings: Lying on your back spread your legs to the side as far apart as you can without causing discomfort.  Hamstring Strength:  Lying on your back, push your heel against the floor with your leg straight by tightening up the muscles of your buttocks.  Repeat, but this time bend your knee to a comfortable angle, and push your heel against the floor.  You may put a pillow under the heel to make it more comfortable if necessary.   A rehabilitation program following joint replacement surgery can speed recovery and prevent re-injury in the future due to weakened muscles. Contact your doctor or a physical therapist for more information on knee rehabilitation.    CONSTIPATION  Constipation is defined medically as fewer than three stools per week and severe constipation as less than one stool per week.  Even if you have a regular bowel pattern at home, your normal regimen is likely to be disrupted due to multiple reasons following surgery.  Combination of anesthesia, postoperative narcotics, change in appetite and fluid intake all can affect your bowels.   YOU  MUST use at least one of the following options; they are listed in order of increasing strength to get the job done.  They are all available over the counter, and you may need to use some, POSSIBLY even all of these options:    Drink plenty of fluids (prune juice may be helpful) and high fiber foods Colace 100 mg by mouth twice a day  Senokot for constipation as directed and as needed Dulcolax (bisacodyl), take with full glass of water  Miralax (polyethylene glycol) once or twice a day as needed.  If you have tried all these things and are unable to have a bowel movement in the first 3-4 days after surgery call either your surgeon or your primary doctor.    If you experience loose stools or diarrhea, hold the medications until you stool forms back up.  If your symptoms do not  get better within 1 week or if they get worse, check with your doctor.  If you experience "the worst abdominal pain ever" or develop nausea or vomiting, please contact the office immediately for further recommendations for treatment.   ITCHING:  If you experience itching with your medications, try taking only a single pain pill, or even half a pain pill at a time.  You can also use Benadryl over the counter for itching or also to help with sleep.   TED HOSE STOCKINGS:  Use stockings on both legs until for at least 2 weeks or as directed by physician office. They may be removed at night for sleeping.  MEDICATIONS:  See your medication summary on the "After Visit Summary" that nursing will review with you.  You may have some home medications which will be placed on hold until you complete the course of blood thinner medication.  It is important for you to complete the blood thinner medication as prescribed.  PRECAUTIONS:  If you experience chest pain or shortness of breath - call 911 immediately for transfer to the hospital emergency department.   If you develop a fever greater that 101 F, purulent drainage from wound,  increased redness or drainage from wound, foul odor from the wound/dressing, or calf pain - CONTACT YOUR SURGEON.                                                   FOLLOW-UP APPOINTMENTS:  If you do not already have a post-op appointment, please call the office for an appointment to be seen by your surgeon.  Guidelines for how soon to be seen are listed in your "After Visit Summary", but are typically between 1-4 weeks after surgery.  OTHER INSTRUCTIONS:   Knee Replacement:  Do not place pillow under knee, focus on keeping the knee straight while resting. CPM instructions: 0-90 degrees, 2 hours in the morning, 2 hours in the afternoon, and 2 hours in the evening. Place foam block, curve side up under heel at all times except when in CPM or when walking.  DO NOT modify, tear, cut, or change the foam block in any way.  MAKE SURE YOU:  Understand these instructions.  Get help right away if you are not doing well or get worse.    Thank you for letting us be a part of your medical care team.  It is a privilege we respect greatly.  We hope these instructions will help you stay on track for a fast and full recovery!     Driving restrictions    Complete by:  As directed   No driving for 6 weeks     Follow the hip precautions as taught in Physical Therapy    Complete by:  As directed      Increase activity slowly as tolerated    Complete by:  As directed      Lifting restrictions    Complete by:  As directed   No lifting for 6 weeks     Patient may shower    Complete by:  As directed   You may shower over the brown dressing     TED hose    Complete by:  As directed   Use stockings (TED hose) for 3 weeks on left leg.  You may remove them at night for sleeping.  May remove TED from right leg as desired     Weight bearing as tolerated    Complete by:  As directed   Laterality:  left  Extremity:  Lower           DISCHARGE MEDICATIONS:     Medication List    STOP taking these medications         aspirin 81 MG tablet     FISH OIL PO      TAKE these medications        albuterol 108 (90 BASE) MCG/ACT inhaler  Commonly known as:  PROVENTIL HFA;VENTOLIN HFA  Inhale 2 puffs into the lungs 2 (two) times daily as needed for wheezing or shortness of breath.     ALPRAZolam 0.5 MG tablet  Commonly known as:  XANAX  Take 0.5 mg by mouth 2 (two) times daily.     AZO CRANBERRY PO  Take 1 capsule by mouth daily.     citalopram 20 MG tablet  Commonly known as:  CELEXA  Take 40 mg by mouth daily.     CITRACAL PO  Take 1 tablet by mouth daily.     cloNIDine 0.2 MG tablet  Commonly known as:  CATAPRES  Take 0.1 mg by mouth 2 (two) times daily. Take 1/2 tablet twice daily     cromolyn 5.2 MG/ACT nasal spray  Commonly known as:  NASALCROM  Place 1 spray into both nostrils daily as needed for allergies.     docusate sodium 100 MG capsule  Commonly known as:  COLACE  Take 100 mg by mouth daily.     fexofenadine 180 MG tablet  Commonly known as:  ALLEGRA  Take 180 mg by mouth daily as needed for allergies or rhinitis.     folic acid 676 MCG tablet  Commonly known as:  FOLVITE  Take 400 mcg by mouth daily.     furosemide 40 MG tablet  Commonly known as:  LASIX  Take 40 mg by mouth daily.     GAS-X PO  Take 1 tablet by mouth as needed.     HYDROcodone-acetaminophen 10-325 MG per tablet  Commonly known as:  NORCO  Take 1 tablet by mouth every 4 (four) hours as needed for moderate pain or severe pain (pain).     hydrOXYzine 10 MG tablet  Commonly known as:  ATARAX/VISTARIL  Take 10 mg by mouth every 6 (six) hours as needed (itching from hydrocodone/apap).     levothyroxine 88 MCG tablet  Commonly known as:  SYNTHROID, LEVOTHROID  Take 88 mcg by mouth daily.     Magnesium 400 MG Tabs  Take 400 mg by mouth daily.     methocarbamol 500 MG tablet  Commonly known as:  ROBAXIN  Take 1 tablet (500 mg total) by mouth every 8 (eight) hours as needed for muscle spasms.      metoCLOPramide 10 MG tablet  Commonly known as:  REGLAN  Take 10 mg by mouth 4 (four) times daily -  before meals and at bedtime.     metoprolol 100 MG tablet  Commonly known as:  LOPRESSOR  Take 100 mg by mouth 2 (two) times daily.     montelukast 10 MG tablet  Commonly known as:  SINGULAIR  Take 10 mg by mouth at bedtime.     multivitamin with minerals Tabs tablet  Take 1 tablet by mouth daily with lunch. Alive     omeprazole 40 MG capsule  Commonly known as:  PRILOSEC  Take 40 mg by mouth daily.     pioglitazone 30 MG tablet  Commonly known as:  ACTOS  Take 30 mg by mouth daily.     POTASSIUM PO  Take 550 mg by mouth daily.     quinapril 40 MG tablet  Commonly known as:  ACCUPRIL  Take 40 mg by mouth daily.     rivaroxaban 10 MG Tabs tablet  Commonly known as:  XARELTO  Take 1 tablet (10 mg total) by mouth daily with breakfast.     SLOW RELEASE IRON 140 (45 FE) MG Tbcr  Generic drug:  Ferrous Sulfate Dried  Take 1 tablet by mouth daily.     sodium chloride 0.65 % Soln nasal spray  Commonly known as:  OCEAN  Place 1 spray into both nostrils as needed for congestion.     SYSTANE OP  Place 1 drop into both eyes daily as needed (dry eyes).     VITAMIN C PO  Take 500 mg by mouth daily.     Vitamin D (Ergocalciferol) 50000 UNITS Caps capsule  Commonly known as:  DRISDOL  Take 50,000 Units by mouth 2 (two) times a week. Tuesday and Thursday        FOLLOW UP VISIT:   Follow-up Information    Follow up with [facility].   Why:  They will contact you to schedule home therapy visits.   Contact information:   [address] [place] [address] [phone]       Follow up with Garald Balding, MD. Schedule an appointment as soon as possible for a visit on 02/17/2015.   Specialty:  Orthopedic Surgery   Contact information:   [address] [place] [address] [phone]       DISPOSITION:   Home  CONDITION:   Stable   Mike Craze. [place], [place] [phone]  02/05/2015 8:56 AM

## 2015-02-04 NOTE — Progress Notes (Signed)
Notified by PT that patient who is to be discharged home today did not do well with therapy session. PT advised that patient would benefit from another day of therapy to do stairs and could possibly progress well enough to go home tomorrow. If not SNF would be recommended. PA Aaron Edelman notified and is aware. He will order social work consult. Nursing will continue to monitor.

## 2015-02-05 LAB — CBC
HCT: 22.5 % — ABNORMAL LOW (ref 36.0–46.0)
Hemoglobin: 7.4 g/dL — ABNORMAL LOW (ref 12.0–15.0)
MCH: 27 pg (ref 26.0–34.0)
MCHC: 32.9 g/dL (ref 30.0–36.0)
MCV: 82.1 fL (ref 78.0–100.0)
PLATELETS: 117 10*3/uL — AB (ref 150–400)
RBC: 2.74 MIL/uL — ABNORMAL LOW (ref 3.87–5.11)
RDW: 16.3 % — AB (ref 11.5–15.5)
WBC: 7.9 10*3/uL (ref 4.0–10.5)

## 2015-02-05 LAB — BASIC METABOLIC PANEL
ANION GAP: 6 (ref 5–15)
BUN: 17 mg/dL (ref 6–20)
CALCIUM: 8.7 mg/dL — AB (ref 8.9–10.3)
CO2: 28 mmol/L (ref 22–32)
CREATININE: 1.24 mg/dL — AB (ref 0.44–1.00)
Chloride: 102 mmol/L (ref 101–111)
GFR calc Af Amer: 52 mL/min — ABNORMAL LOW (ref >60)
GFR, EST NON AFRICAN AMERICAN: 45 mL/min — AB (ref >60)
GLUCOSE: 137 mg/dL — AB (ref 65–99)
Potassium: 4 mmol/L (ref 3.5–5.1)
Sodium: 136 mmol/L (ref 135–145)

## 2015-02-05 LAB — GLUCOSE, CAPILLARY
Glucose-Capillary: 136 mg/dL — ABNORMAL HIGH (ref 65–99)
Glucose-Capillary: 86 mg/dL (ref 65–99)

## 2015-02-05 MED ORDER — INFLUENZA VAC SPLIT QUAD 0.5 ML IM SUSY: 0.5000 mL | PREFILLED_SYRINGE | INTRAMUSCULAR | Status: DC

## 2015-02-05 NOTE — Progress Notes (Signed)
Occupational Therapy Treatment Patient Details Name: [name] MRN: [id number] DOB: April 08, 1951 Today's Date: 02/05/2015    History of present illness Pt is a 64 y.o. F w/ prior h/o Lt hip AVN, now s/p Lt THA.  Pt's PMH includes gout, CKD, anemia, anxiety and depression, HTN, DM.   OT comments  Patient done well with OT today, she was able to tolerate entire session. Patient's sister was present and was able to provide appropriate verbal cueing and physical assistance prn for patient's safety. Plan is for patient to discharge home with sister. Continue to recommend HHOT to maximize patient's independence. Pt states she has her mother's 55 year old 3-in-1 at home, therefore recommending a new 3-in-1 for patient to use over toilet seat and in tub/shower.   Follow Up Recommendations  Home health OT;Supervision/Assistance - 24 hour    Equipment Recommendations  3 in 1 bedside comode    Recommendations for Other Services  None at this tmie   Precautions / Restrictions Precautions Precautions: Posterior Hip;Fall Precaution Comments: reviewed hip precautions, pt able to independently verbalize 2/3 Required Braces or Orthoses: Knee Immobilizer - Left Knee Immobilizer - Left: Other (comment) (not specified) Restrictions Weight Bearing Restrictions: Yes LLE Weight Bearing: Weight bearing as tolerated    Mobility Bed Mobility Overal bed mobility: Needs Assistance Bed Mobility: Supine to Sit     Supine to sit: Mod assist     General bed mobility comments: Mod assist for management of BLEs. Sister assisting patient.   Transfers Overall transfer level: Needs assistance Equipment used: Rolling walker (2 wheeled) Transfers: Sit to/from Stand Sit to Stand: Min guard General transfer comment: Min guard for safety. Cues for technique and hand placement. Sister providing cues and physical assistance prn.     Balance Overall balance assessment: Needs assistance Sitting-balance support: No  upper extremity supported;Feet supported Sitting balance-Leahy Scale: Fair     Standing balance support: Bilateral upper extremity supported;During functional activity Standing balance-Leahy Scale: Fair   ADL Overall ADL's : Needs assistance/impaired General ADL Comments: Pt with increased energy this session and able to tolerate entire OT session. Pt found supine in bed with sister present in room. Had sister assist pt with bed mobility and transfers since her sister will be the one assisting post acute d/c. Pt engaged in bed mobility, transferred > recliner, then therapist assisted pt > therapy gym. Pt performed tub/shower transfer using BSC with min assist. Sister providing cues and assistance to patient. Continue to recommend [place].      Cognition   Behavior During Therapy: WFL for tasks assessed/performed Overall Cognitive Status: Within Functional Limits for tasks assessed                 Pertinent Vitals/ Pain       Pain Assessment: 0-10 Pain Score: 4  Pain Location: left hip (surgical site) Pain Descriptors / Indicators: Tender Pain Intervention(s): Monitored during session   Frequency Min 2X/week     Progress Toward Goals  OT Goals(current goals can now befound in the care plan section)  Progress towards OT goals: Progressing toward goals     Plan Discharge plan remains appropriate    End of Session Equipment Utilized During Treatment: Rolling walker   Activity Tolerance Patient tolerated treatment well   Patient Left in chair (with PT)    Time: 1027-1050 OT Time Calculation (min): 23 min  Charges: OT General Charges $OT Visit: 1 Procedure OT Treatments $Self Care/Home Management : 23-37 mins  Hubert Derstine ,  MS, OTR/L, CLT Pager: [phone]  02/05/2015, 11:20 AM

## 2015-02-05 NOTE — Progress Notes (Signed)
Physical Therapy Treatment Patient Details Name: [name] MRN: [id number] DOB: [birth year] Today's Date: 02/05/2015    History of Present Illness Pt is a 64 y.o. F w/ prior h/o Lt hip AVN, now s/p Lt THA.  Pt's PMH includes gout, CKD, anemia, anxiety and depression, HTN, DM.    PT Comments    Pt will be able to handle getting up steps at home, using her sequence as instructed and with gait belt to increase feeling of security. Pt is having some concern about the pain and "weakness" of LLE, but can support herself to step off that LE.  Sister and pt are able to handle the effort but pt will need to be confident about her accomplishment.  Follow Up Recommendations  Home health PT;Supervision/Assistance - 24 hour     Equipment Recommendations  None recommended by PT    Recommendations for Other Services OT consult     Precautions / Restrictions Precautions Precautions: Posterior Hip;Fall Required Braces or Orthoses: Knee Immobilizer - Left Knee Immobilizer - Left: Other (comment) Restrictions Weight Bearing Restrictions: Yes LLE Weight Bearing: Weight bearing as tolerated    Mobility  Bed Mobility Overal bed mobility: Needs Assistance Bed Mobility: Supine to Sit     Supine to sit: Min assist     General bed mobility comments: up when PT entered  Transfers Overall transfer level: Needs assistance Equipment used: Rolling walker (2 wheeled)   Sit to Stand: Min guard Stand pivot transfers: Min guard       General transfer comment: Min guard for safety. Cues for technique and hand placement. Sister providing cues and physical assistance prn.   Ambulation/Gait Ambulation/Gait assistance: Min guard Ambulation Distance (Feet): 10 Feet Assistive device: Rolling walker (2 wheeled)   Gait velocity: slow Gait velocity interpretation: Below normal speed for age/gender General Gait Details: reminders for upright posture and sister available to encourage   Stairs    Stairs assistance: Min assist Stair Management: One rail Left Number of Stairs: 4 General stair comments: prompted pattern and sister was involved cueing as well  Wheelchair Mobility    Modified Rankin (Stroke Patients Only)       Balance     Sitting balance-Leahy Scale: Good       Standing balance-Leahy Scale: Fair                      Cognition Arousal/Alertness: Awake/alert Behavior During Therapy: WFL for tasks assessed/performed Overall Cognitive Status: Within Functional Limits for tasks assessed                      Exercises      General Comments General comments (skin integrity, edema, etc.): pt was able to handle steps with extra time and cues.  Sister was concerned she should encircle pt and try to lift her up but reminded sister that she would be likely to fall as would pt.  Both need to take time to let pt finish the work of stepping up and down with pt getting comfortable with the effort      Pertinent Vitals/Pain Pain Assessment: 0-10 Pain Score: 5  Pain Location: L hip Pain Descriptors / Indicators: Aching;Operative site guarding Pain Intervention(s): Premedicated before session    Home Living                      Prior Function            PT Goals (  current goals can now be found in the care plan section) Acute Rehab PT Goals Patient Stated Goal: to try to get home this afternoon Progress towards PT goals: Progressing toward goals    Frequency  7X/week    PT Plan Current plan remains appropriate    Co-evaluation             End of Session Equipment Utilized During Treatment: Gait belt Activity Tolerance: Patient limited by fatigue;Patient limited by pain Patient left: in chair;with call bell/phone within reach;with chair alarm set;with family/visitor present     Time: 1445-1519 PT Time Calculation (min) (ACUTE ONLY): 34 min  Charges:  $Gait Training: 23-37 mins                    G Codes:       Ramond Dial Feb 22, 2015, 4:46 PM   Mee Hives, PT MS Acute Rehab Dept. Number: [facility] [id number] and [place] [phone]

## 2015-02-05 NOTE — Progress Notes (Signed)
Patient decided that she would need a 3N1, contacted Merry Proud with [facility] and requested 3N1 be delivered to patient's room.

## 2015-02-05 NOTE — Progress Notes (Signed)
Physical Therapy Treatment Patient Details Name: [name] MRN: [id number] DOB: 30-May-1951 Today's Date: 02/05/2015    History of Present Illness Pt is a 64 y.o. F w/ prior h/o Lt hip AVN, now s/p Lt THA.  Pt's PMH includes gout, CKD, anemia, anxiety and depression, HTN, DM.    PT Comments    Pt was able to get up the stairs, afraid but capable.  Talked with her about using belt to feel secure both for her helpers and for herself.  Has a real ability to use LLE but will need to keep pain managed well to reduce her insecurity about her functional abiltiy.  Follow Up Recommendations  Home health PT;Supervision/Assistance - 24 hour     Equipment Recommendations  None recommended by PT    Recommendations for Other Services OT consult     Precautions / Restrictions Precautions Precautions: Posterior Hip;Fall Precaution Comments: reviewed hip precautions, pt able to independently verbalize 2/3 Required Braces or Orthoses: Knee Immobilizer - Left Knee Immobilizer - Left: Other (comment) Restrictions Weight Bearing Restrictions: Yes LLE Weight Bearing: Weight bearing as tolerated    Mobility  Bed Mobility Overal bed mobility: Needs Assistance Bed Mobility: Supine to Sit     Supine to sit: Mod assist     General bed mobility comments: up when PT entered  Transfers Overall transfer level: Needs assistance Equipment used: Rolling walker (2 wheeled) Transfers: Sit to/from [facility] Sit to Stand: Min guard Stand pivot transfers: Min guard       General transfer comment: Min guard for safety. Cues for technique and hand placement. Sister providing cues and physical assistance prn.   Ambulation/Gait Ambulation/Gait assistance: Min guard Ambulation Distance (Feet): 70 Feet Assistive device: Rolling walker (2 wheeled) Gait Pattern/deviations: Decreased step length - right;Decreased step length - left;Decreased stance time - left;Wide base of  support;Trunk flexed Gait velocity: slow Gait velocity interpretation: Below normal speed for age/gender General Gait Details: reminders for upright posture and sister available to encourage   Stairs Stairs: Yes Stairs assistance: Min assist Stair Management: One rail Left;Step to pattern;Forwards;Backwards Number of Stairs: 4 General stair comments: prompted pattern and sister was involved cueing as well  Wheelchair Mobility    Modified Rankin (Stroke Patients Only)       Balance Overall balance assessment: Needs assistance Sitting-balance support: Feet supported Sitting balance-Leahy Scale: Good   Postural control: Posterior lean Standing balance support: Bilateral upper extremity supported Standing balance-Leahy Scale: Fair Standing balance comment: good control with walker but afraid of her L hip pain                    Cognition Arousal/Alertness: Awake/alert Behavior During Therapy: WFL for tasks assessed/performed Overall Cognitive Status: Within Functional Limits for tasks assessed                      Exercises      General Comments General comments (skin integrity, edema, etc.): Sister availiable to instruct on stairs with better control of the walker and hand rail as pt practiced.  Biggest fear is that she won't be able to tolerate L hip pain,and reminded her to use ice and meds.      Pertinent Vitals/Pain Pain Assessment: 0-10 Pain Score: 7  Pain Location: L hip Pain Descriptors / Indicators: Aching Pain Intervention(s): Limited activity within patient's tolerance;Monitored during session;Premedicated before session;Repositioned    Home Living  Prior Function            PT Goals (current goals can now be found in the care plan section) Progress towards PT goals: Progressing toward goals    Frequency  7X/week    PT Plan Current plan remains appropriate    Co-evaluation             End of  Session Equipment Utilized During Treatment: Gait belt Activity Tolerance: Patient limited by fatigue;Patient limited by pain Patient left: in chair;with call bell/phone within reach;with chair alarm set;with family/visitor present     Time: 1014-1040 PT Time Calculation (min) (ACUTE ONLY): 26 min  Charges:  $Gait Training: 23-37 mins                    G Codes:      Ramond Dial 02-13-2015, 12:36 PM   Mee Hives, PT MS Acute Rehab Dept. Number: [facility] [id number] and [place] [phone]

## 2015-02-05 NOTE — Progress Notes (Signed)
Patient ID: [name], female   DOB: April 26, 1951, 64 y.o.   MRN: [id number] PATIENT ID: [name]        MRN:  [id number]          DOB/AGE: [birth year] / 64 y.o.    Angela Fears, MD   [name], PA-C [address] [place], [place]  [address]                             [phone]   PROGRESS NOTE  Subjective:  negative for Chest Pain  negative for Shortness of Breath  negative for Nausea/Vomiting   negative for Calf Pain    Tolerating Diet: yes         Patient reports pain as mild.     Taking analgesics about q8h, much more comfortable this am and anxious for D/C to home, sister agrees  Objective: Vital signs in last 24 hours:   Patient Vitals for the past 24 hrs:  BP Temp Pulse Resp SpO2  02/05/15 0612 (!) 133/52 mmHg 99.2 F (37.3 C) 79 18 100 %  02/04/15 1946 (!) 124/56 mmHg (!) 100.5 F (38.1 C) 93 18 98 %  02/04/15 1852 (!) 130/53 mmHg - 96 - -  02/04/15 1830 - 99.5 F (37.5 C) - - -  02/04/15 1300 (!) 143/56 mmHg 100.1 F (37.8 C) 87 18 100 %      Intake/Output from previous day:   08/25 0701 - 08/26 0700 In: 920 [P.O.:920] Out: 300 [Urine:300]   Intake/Output this shift:       Intake/Output      08/25 0701 - 08/26 0700 08/26 0701 - 08/27 0700   P.O. 920    Total Intake(mL/kg) 920 (13.3)    Urine (mL/kg/hr) 300 (0.2)    Total Output 300     Net +620          Urine Occurrence 4 x       LABORATORY DATA:  Recent Labs  02/02/15 1139 02/02/15 1230 02/03/15 0410 02/04/15 0430 02/05/15 0400  WBC  --   --  7.2 5.9 7.9  HGB 11.2* 11.6* 8.0* 7.3* 7.4*  HCT 33.0* 34.0* 23.9* 21.9* 22.5*  PLT  --   --  118* 112* 117*    Recent Labs  02/02/15 1139 02/02/15 1230 02/03/15 0410 02/04/15 0430 02/05/15 0400  NA 135 137 133* 138 136  K 4.5 4.5 4.1 3.8 4.0  CL  --   --  101 105 102  CO2  --   --  26 25 28   BUN  --   --  24* 20 17  CREATININE  --   --  1.29* 1.16* 1.24*  GLUCOSE 186* 173* 123* 123* 137*  CALCIUM  --   --  8.5* 8.7*  8.7*   Lab Results  Component Value Date   INR 1.02 01/21/2015   INR 1.03 12/25/2014    Recent Radiographic Studies :  Dg Hip Unilat With Pelvis 2-3 Views Left  02/02/2015   CLINICAL DATA:  Postop hip replacement.  EXAM: DG HIP (WITH OR WITHOUT PELVIS) 2-3V LEFT  COMPARISON:  02/03/2013  FINDINGS: Examination demonstrates a left total hip arthroplasty intact and normally located. There are minimal degenerative changes of the right hip. There is mild flattening of the right femoral head with mild heterogeneous sclerosis of the femoral head as cannot exclude early avascular necrosis. Multiple surgical clips over the left lower  abdomen likely due to hernia repair. Remainder of the exam is unchanged.  IMPRESSION: Evidence of total left hip arthroplasty which is intact.  Mild degenerate change of the right hip with suggestion of mild flattening of the femoral head which could be seen due to early avascular necrosis. Recommend clinical correlation as consider MRI for further evaluation.   Electronically Signed   By: Marin Olp M.D.   On: 02/02/2015 14:12     Examination:  General appearance: alert, cooperative and no distress  Wound Exam: clean, dry, intact   Drainage:  None: wound tissue dry  Motor Exam: EHL, FHL, Anterior Tibial and Posterior Tibial Intact  Sensory Exam: Superficial Peroneal, Deep Peroneal and Tibial normal  Vascular Exam: Normal  Assessment:    3 Days Post-Op  Procedure(s) (LRB): LEFT TOTAL HIP ARTHROPLASTY (Left)  ADDITIONAL DIAGNOSIS:  Principal Problem:   Avascular necrosis of left femoral head Active Problems:   Obesity   S/P total hip arthroplasty  Acute Blood Loss Anemia-stable,asymptomatic   Plan: Physical Therapy as ordered Weight Bearing as Tolerated (WBAT)  DVT Prophylaxis:  Xarelto and TED hose  DISCHARGE PLAN: Home  DISCHARGE NEEDS: HHPT, Walker and 3-in-1 comode seat   discussed D/C with [name], her sister and nurse-pt wants to go  home, doing well with stable lab, VS-will discharge to home today after PT     [name] W  02/05/2015 7:41 AM

## 2015-02-09 DIAGNOSIS — N189 Chronic kidney disease, unspecified: Secondary | ICD-10-CM

## 2015-08-04 ENCOUNTER — Ambulatory Visit: Payer: BLUE CROSS/BLUE SHIELD | Admitting: Cardiology

## 2015-09-21 ENCOUNTER — Encounter: Payer: Self-pay | Admitting: *Deleted

## 2015-09-22 ENCOUNTER — Ambulatory Visit (INDEPENDENT_AMBULATORY_CARE_PROVIDER_SITE_OTHER): Payer: BLUE CROSS/BLUE SHIELD | Admitting: Cardiovascular Disease

## 2015-09-22 ENCOUNTER — Encounter: Payer: Self-pay | Admitting: Cardiovascular Disease

## 2015-09-22 VITALS — BP 120/72 | HR 56 | Ht 64.0 in | Wt 154.0 lb

## 2015-09-22 DIAGNOSIS — Z01818 Encounter for other preprocedural examination: Secondary | ICD-10-CM | POA: Diagnosis not present

## 2015-09-22 DIAGNOSIS — I340 Nonrheumatic mitral (valve) insufficiency: Secondary | ICD-10-CM | POA: Diagnosis not present

## 2015-09-22 DIAGNOSIS — I10 Essential (primary) hypertension: Secondary | ICD-10-CM

## 2015-09-22 NOTE — Progress Notes (Signed)
Patient ID: [name], female   DOB: 1950-08-03, 65 y.o.   MRN: [id number]      SUBJECTIVE: The patient presents for preoperative risk stratification for right hip replacement to be performed by Dr. Durward Fortes. She underwent left total hip arthroplasty in August Q000111Q without complications. She saw Ignacia Bayley NP on 01/13/15. She has no prior cardiac history per se. She has a history of hypertension and mitral regurgitation. She underwent nuclear stress testing in July 2016 which was negative for ischemia. Echocardiogram on 01/08/15 demonstrated normal left ventricular systolic function and moderate mitral regurgitation. She does have a fairly long history of exertional substernal chest pressure and dyspnea at higher levels of activity. This is intermittent and not lifestyle limiting. She denies any history of PND, orthopnea, dizziness, and syncope. She gets mild left ankle swelling if she is up on her feet for long periods of time.  ECG shows sinus rhythm with a nonspecific T wave abnormality.   Review of Systems: As per "subjective", otherwise negative.  Allergies  Allergen Reactions  . Other Itching and Other (See Comments)    Acidic foods cause stomach upset and itching  . Allopurinol Rash  . Penicillins Rash    Name brand  . Sulfa Antibiotics Rash    Current Outpatient Prescriptions  Medication Sig Dispense Refill  . albuterol (PROVENTIL HFA;VENTOLIN HFA) 108 (90 BASE) MCG/ACT inhaler Inhale 2 puffs into the lungs 2 (two) times daily as needed for wheezing or shortness of breath.    . ALPRAZolam (XANAX) 0.5 MG tablet Take 0.5 mg by mouth 2 (two) times daily.   5  . Ascorbic Acid (VITAMIN C PO) Take 500 mg by mouth daily.     [name] aspirin 81 MG tablet Take 81 mg by mouth daily.    . Atorvastatin Calcium (LIPITOR PO) Take by mouth.    . Calcium Citrate (CITRACAL PO) Take 1 tablet by mouth daily.    . citalopram (CELEXA) 20 MG tablet Take 40 mg by mouth daily.   12  . cloNIDine  (CATAPRES) 0.2 MG tablet Take 0.1 mg by mouth 2 (two) times daily. Take 1/2 tablet twice daily    . Cranberry-Vitamin C-Probiotic (AZO CRANBERRY PO) Take 1 capsule by mouth daily.    . cromolyn (NASALCROM) 5.2 MG/ACT nasal spray Place 1 spray into both nostrils daily as needed for allergies.    . Dextromethorphan-Guaifenesin (MUCINEX DM PO) Take by mouth.    . docusate sodium (COLACE) 100 MG capsule Take 100 mg by mouth daily.    . Ferrous Sulfate Dried (SLOW RELEASE IRON) 140 (45 FE) MG TBCR Take 1 tablet by mouth daily.    . fexofenadine (ALLEGRA) 180 MG tablet Take 180 mg by mouth daily as needed for allergies or rhinitis.    . folic acid (FOLVITE) Q000111Q MCG tablet Take 400 mcg by mouth daily.    . furosemide (LASIX) 40 MG tablet Take 40 mg by mouth daily.  12  . HYDROcodone-acetaminophen (NORCO) 10-325 MG per tablet Take 1 tablet by mouth every 4 (four) hours as needed for moderate pain or severe pain (pain). 90 tablet 0  . hydrOXYzine (ATARAX/VISTARIL) 10 MG tablet Take 10 mg by mouth every 6 (six) hours as needed (itching from hydrocodone/apap).    [name] levothyroxine (SYNTHROID, LEVOTHROID) 88 MCG tablet Take 88 mcg by mouth daily.  12  . Magnesium 400 MG TABS Take 400 mg by mouth daily.    . metoprolol (LOPRESSOR) 100 MG tablet Take 100 mg  by mouth 2 (two) times daily.  12  . montelukast (SINGULAIR) 10 MG tablet Take 10 mg by mouth at bedtime.  12  . Multiple Vitamin (MULTIVITAMIN WITH MINERALS) TABS tablet Take 1 tablet by mouth daily with lunch. Alive    . omeprazole (PRILOSEC) 40 MG capsule Take 40 mg by mouth daily.    . pioglitazone (ACTOS) 30 MG tablet Take 30 mg by mouth daily.  6  . Polyethyl Glycol-Propyl Glycol (SYSTANE OP) Place 1 drop into both eyes daily as needed (dry eyes).    [name] POTASSIUM PO Take 550 mg by mouth daily.     . quinapril (ACCUPRIL) 40 MG tablet Take 40 mg by mouth daily.  6  . Simethicone (GAS-X PO) Take 1 tablet by mouth as needed.    . sodium chloride (OCEAN)  0.65 % SOLN nasal spray Place 1 spray into both nostrils as needed for congestion.    . Vitamin D, Ergocalciferol, (DRISDOL) 50000 UNITS CAPS capsule Take 50,000 Units by mouth 2 (two) times a week. Tuesday and Thursday  12   No current facility-administered medications for this visit.    Past Medical History  Diagnosis Date  . Gout   . Chronic kidney disease   . Anemia   . GERD (gastroesophageal reflux disease)   . Arthritis   . Stevens-Johnson disease ([place])   . Moderate mitral insufficiency     a. 12/2014 Echo: EF 55-60%, mod MR, mildly to moderately dil LA, PASP 71mmHg.  [name] Shortness of breath dyspnea     ALbuterol daily as needed  . Anxiety     takes Xanax daily   . Depression     takes Celexa daily  . Hypothyroidism     takes Synthroid daily  . Hypertension     takes Catapres and Quinapril daily  . Diabetes mellitus without complication ([place])     takes Actos daily  . History of blood transfusion     no abnormal reaction noted    Past Surgical History  Procedure Laterality Date  . Abdominal hysterectomy    . Hernia repair    . Colon surgery    . Total hip arthroplasty Left 02/02/2015    Procedure: LEFT TOTAL HIP ARTHROPLASTY;  Surgeon: Garald Balding, MD;  Location: [facility];  Service: Orthopedics;  Laterality: Left;    Social History   Social History  . Marital Status: Single    Spouse Name: N/A  . Number of Children: N/A  . Years of Education: N/A   Occupational History  . Not on file.   Social History Main Topics  . Smoking status: Never Smoker   . Smokeless tobacco: Never Used  . Alcohol Use: No  . Drug Use: No  . Sexual Activity: Not on file   Other Topics Concern  . Not on file   Social History Narrative     Filed Vitals:   09/22/15 1122  BP: 120/72  Pulse: 56  Height: 5\' 4"  (1.626 m)  Weight: 154 lb (69.854 kg)  SpO2: 99%    PHYSICAL EXAM General: NAD HEENT: Normal. Neck: No JVD, no thyromegaly. Lungs: Clear to auscultation  bilaterally with normal respiratory effort. CV: Nondisplaced PMI.  Regular rate and rhythm, normal S1/S2, no S3/S4, no murmur.Trace left periankle edema. No carotid bruits. Abdomen: Soft, nontender, no distention.  Neurologic: Alert and oriented.  Psych: Normal affect. Skin: Normal.  ECG: Most recent ECG reviewed.      ASSESSMENT AND PLAN: 1. Preoperative risk  stratification: Has chronic symptoms of exertional chest discomfort and dyspnea on higher levels of exertion but intermittent in nature. She has undergone stress testing and echocardiogram in July 2016 which have shown normal LV function without evidence of ischemia. She does have moderate mitral regurgitation by echo. I recommend continuation of beta blocker therapy throughout the perioperative period and close monitoring of intake and output postoperatively to avoid volume overload. She may proceed to the OR without further evaluation at this time.  2. Moderate mitral regurgitation: This was noted on echocardiogram in 12/2014. This may be playing a role in her exertional dyspnea, though LV function is normal. Volume status is stable today. She will need to have close monitoring of I's and O's throughout perioperative period along with continuation of beta blocker therapy. Follow-up echo in August 2017.  3. Essential hypertension: Blood pressure is controlled today. She is on beta blocker, clonidine, and diuretic therapy.  Dispo: fu 1 year.  Kate Sable, M.D., F.A.C.C.

## 2015-09-22 NOTE — Patient Instructions (Signed)
Your physician recommends that you continue on your current medications as directed. Please refer to the Current Medication list given to you today. Your physician has requested that you have an echocardiogram in August 2017. Echocardiography is a painless test that uses sound waves to create images of your heart. It provides your doctor with information about the size and shape of your heart and how well your heart's chambers and valves are working. This procedure takes approximately one hour. There are no restrictions for this procedure. Your physician recommends that you schedule a follow-up appointment in: 1 year. You will receive a reminder letter in the mail in about 10 months reminding you to call and schedule your appointment. If you don't receive this letter, please contact our office.

## 2016-01-03 ENCOUNTER — Telehealth: Payer: Self-pay | Admitting: *Deleted

## 2016-01-03 NOTE — Telephone Encounter (Signed)
Fax received from Dr. [name].  Requesting cardiac clearance for right hip replacement.  Clearance was given in April, but patient put off scheduling till now.  Looks like she has Echo scheduled for August as requested at last OV.

## 2016-01-04 NOTE — Telephone Encounter (Signed)
Noted, will fax note back to Dr. Durward Fortes.

## 2016-01-04 NOTE — Telephone Encounter (Signed)
She may proceed to the OR without further evaluation at this time.

## 2016-01-20 ENCOUNTER — Encounter (HOSPITAL_COMMUNITY): Payer: Self-pay

## 2016-01-20 NOTE — Pre-Procedure Instructions (Signed)
[name]  01/20/2016      [id number] - [place],  - [name] Phone: [phone] Fax: [place], [address] [place] [address] Phone: [phone] Fax: [phone]    Your procedure is scheduled on Tuesday, August 22.  Report to [facility] Admitting at 5:30 A.M.   Call this number if you have problems the morning of surgery:  [phone]   Remember:  Do not eat food or drink liquids after midnight.   Take these medicines the morning of surgery with A SIP OF WATER: Albuterol inhaler if needed (please bring inhaler with you), Alprazolam (Xanax) if needed, Clonidine (Catapres), Nasal spray if needed, Fexofenadine (Allegra) if needed, Hydrocodone-acetaminophen (Norco), Levothyroxine (Synthroid), Methocarbamol (Robaxin) if needed, Metoclopramide (Reglan) if needed, Metoprolol (Lopressor), Omeprazole (Prilosec).  7 days prior to surgery, stop taking: Aspirin, NSAIDS, Aleve, Naproxen, Ibuprofen, Advil, Motrin, BC's, Goody's, Fish oil, all herbal medications, and all vitamins.    WHAT DO I DO ABOUT MY DIABETES MEDICATION?  [name] Do not take oral diabetes medicines (pills) the morning of surgery.  Do NOT take Actos (Pioglitazone) the morning of surgery  . The day of surgery, do not take other diabetes injectables, including Byetta (exenatide), Bydureon (exenatide ER), Victoza (liraglutide), or Trulicity (dulaglutide).    How to Manage Your Diabetes Before and After Surgery  Why is it important to control my blood sugar before and after surgery? . Improving blood sugar levels before and after surgery helps healing and can limit problems. . A way of improving blood sugar control is eating a healthy diet by: o  Eating less sugar and carbohydrates o  Increasing activity/exercise o  Talking with your doctor about reaching your blood sugar  goals . High blood sugars (greater than 180 mg/dL) can raise your risk of infections and slow your recovery, so you will need to focus on controlling your diabetes during the weeks before surgery. . Make sure that the doctor who takes care of your diabetes knows about your planned surgery including the date and location.  How do I manage my blood sugar before surgery? . Check your blood sugar at least 4 times a day, starting 2 days before surgery, to make sure that the level is not too high or low. o Check your blood sugar the morning of your surgery when you wake up and every 2 hours until you get to the Short Stay unit. . If your blood sugar is less than 70 mg/dL, you will need to treat for low blood sugar: o Do not take insulin. o Treat a low blood sugar (less than 70 mg/dL) with  cup of clear juice (cranberry or apple), 4 glucose tablets, OR glucose gel. o Recheck blood sugar in 15 minutes after treatment (to make sure it is greater than 70 mg/dL). If your blood sugar is not greater than 70 mg/dL on recheck, call [phone] for further instructions. . Report your blood sugar to the short stay nurse when you get to Short Stay.  . If you are admitted to the hospital after surgery: o Your blood sugar will be checked by the staff and you will probably be given insulin after surgery (instead of oral diabetes medicines) to make sure you have good blood sugar levels. o The goal for blood sugar control after surgery is 80-180 mg/dL.     Do not  wear jewelry, make-up or nail polish.  Do not wear lotions, powders, or perfumes.    Do not shave 48 hours prior to surgery.    Do not bring valuables to the hospital.  [facility] is not responsible for any belongings or valuables.  Contacts, dentures or bridgework may not be worn into surgery.  Leave your suitcase in the car.  After surgery it may be brought to your room.  For patients admitted to the hospital, discharge time will be determined by  your treatment team.  Patients discharged the day of surgery will not be allowed to drive home.    [facility]- Preparing For Surgery  Before surgery, you can play an important role. Because skin is not sterile, your skin needs to be as free of germs as possible. You can reduce the number of germs on your skin by washing with CHG (chlorahexidine gluconate) Soap before surgery.  CHG is an antiseptic cleaner which kills germs and bonds with the skin to continue killing germs even after washing.  Please do not use if you have an allergy to CHG or antibacterial soaps. If your skin becomes reddened/irritated stop using the CHG.  Do not shave (including legs and underarms) for at least 48 hours prior to first CHG shower. It is OK to shave your face.  Please follow these instructions carefully.   1. Shower the NIGHT BEFORE SURGERY and the MORNING OF SURGERY with CHG.   2. If you chose to wash your hair, wash your hair first as usual with your normal shampoo.  3. After you shampoo, rinse your hair and body thoroughly to remove the shampoo.  4. Use CHG as you would any other liquid soap. You can apply CHG directly to the skin and wash gently with a scrungie or a clean washcloth.   5. Apply the CHG Soap to your body ONLY FROM THE NECK DOWN.  Do not use on open wounds or open sores. Avoid contact with your eyes, ears, mouth and genitals (private parts). Wash genitals (private parts) with your normal soap.  6. Wash thoroughly, paying special attention to the area where your surgery will be performed.  7. Thoroughly rinse your body with warm water from the neck down.  8. DO NOT shower/wash with your normal soap after using and rinsing off the CHG Soap.  9. Pat yourself dry with a CLEAN TOWEL.   10. Wear CLEAN PAJAMAS   11. Place CLEAN SHEETS on your bed the night of your first shower and DO NOT SLEEP WITH PETS.  Day of Surgery: Do not apply any deodorants/lotions. Please wear clean clothes to  the hospital/surgery center.    Please read over the following fact sheets that you were given. MRSA Information

## 2016-01-21 ENCOUNTER — Other Ambulatory Visit (HOSPITAL_COMMUNITY): Payer: BLUE CROSS/BLUE SHIELD

## 2016-01-21 ENCOUNTER — Encounter (HOSPITAL_COMMUNITY): Payer: Self-pay

## 2016-01-21 ENCOUNTER — Encounter (HOSPITAL_COMMUNITY)
Admission: RE | Admit: 2016-01-21 | Discharge: 2016-01-21 | Disposition: A | Discharge status: Home / Self Care | Payer: BLUE CROSS/BLUE SHIELD | Source: Ambulatory Visit | Attending: Orthopaedic Surgery | Admitting: Orthopaedic Surgery

## 2016-01-21 DIAGNOSIS — Z79899 Other long term (current) drug therapy: Secondary | ICD-10-CM | POA: Diagnosis not present

## 2016-01-21 DIAGNOSIS — Z7982 Long term (current) use of aspirin: Secondary | ICD-10-CM | POA: Insufficient documentation

## 2016-01-21 DIAGNOSIS — Z01818 Encounter for other preprocedural examination: Secondary | ICD-10-CM | POA: Diagnosis present

## 2016-01-21 DIAGNOSIS — F419 Anxiety disorder, unspecified: Secondary | ICD-10-CM | POA: Insufficient documentation

## 2016-01-21 DIAGNOSIS — J449 Chronic obstructive pulmonary disease, unspecified: Secondary | ICD-10-CM | POA: Insufficient documentation

## 2016-01-21 DIAGNOSIS — M1611 Unilateral primary osteoarthritis, right hip: Secondary | ICD-10-CM | POA: Insufficient documentation

## 2016-01-21 DIAGNOSIS — I340 Nonrheumatic mitral (valve) insufficiency: Secondary | ICD-10-CM | POA: Diagnosis not present

## 2016-01-21 DIAGNOSIS — E039 Hypothyroidism, unspecified: Secondary | ICD-10-CM | POA: Diagnosis not present

## 2016-01-21 DIAGNOSIS — Z7984 Long term (current) use of oral hypoglycemic drugs: Secondary | ICD-10-CM | POA: Diagnosis not present

## 2016-01-21 DIAGNOSIS — E785 Hyperlipidemia, unspecified: Secondary | ICD-10-CM | POA: Diagnosis not present

## 2016-01-21 DIAGNOSIS — N189 Chronic kidney disease, unspecified: Secondary | ICD-10-CM | POA: Diagnosis not present

## 2016-01-21 DIAGNOSIS — Z96642 Presence of left artificial hip joint: Secondary | ICD-10-CM | POA: Diagnosis not present

## 2016-01-21 DIAGNOSIS — I130 Hypertensive heart and chronic kidney disease with heart failure and stage 1 through stage 4 chronic kidney disease, or unspecified chronic kidney disease: Secondary | ICD-10-CM | POA: Diagnosis not present

## 2016-01-21 DIAGNOSIS — E1122 Type 2 diabetes mellitus with diabetic chronic kidney disease: Secondary | ICD-10-CM | POA: Insufficient documentation

## 2016-01-21 DIAGNOSIS — K219 Gastro-esophageal reflux disease without esophagitis: Secondary | ICD-10-CM | POA: Insufficient documentation

## 2016-01-21 DIAGNOSIS — Z01812 Encounter for preprocedural laboratory examination: Secondary | ICD-10-CM | POA: Diagnosis not present

## 2016-01-21 HISTORY — DX: Unspecified urinary incontinence: R32

## 2016-01-21 HISTORY — DX: Hyperlipidemia, unspecified: E78.5

## 2016-01-21 HISTORY — DX: Personal history of other diseases of the respiratory system: Z87.09

## 2016-01-21 HISTORY — DX: Palpitations: R00.2

## 2016-01-21 HISTORY — DX: Unspecified osteoarthritis, unspecified site: M19.90

## 2016-01-21 HISTORY — DX: Allergic rhinitis, unspecified: J30.9

## 2016-01-21 HISTORY — DX: Unspecified hemorrhoids: K64.9

## 2016-01-21 HISTORY — DX: Effusion, unspecified ankle: M25.473

## 2016-01-21 HISTORY — DX: Supraventricular tachycardia, unspecified: I47.10

## 2016-01-21 HISTORY — DX: Cardiac murmur, unspecified: R01.1

## 2016-01-21 HISTORY — DX: Supraventricular tachycardia: I47.1

## 2016-01-21 HISTORY — DX: Unspecified asthma, uncomplicated: J45.909

## 2016-01-21 HISTORY — DX: Other visual disturbances: H53.8

## 2016-01-21 HISTORY — DX: Tinnitus, unspecified ear: H93.19

## 2016-01-21 LAB — BASIC METABOLIC PANEL
Anion gap: 7 (ref 5–15)
BUN: 19 mg/dL (ref 6–20)
CALCIUM: 10 mg/dL (ref 8.9–10.3)
CO2: 28 mmol/L (ref 22–32)
CREATININE: 1 mg/dL (ref 0.44–1.00)
Chloride: 104 mmol/L (ref 101–111)
GFR, EST NON AFRICAN AMERICAN: 59 mL/min — AB (ref >60)
Glucose, Bld: 120 mg/dL — ABNORMAL HIGH (ref 65–99)
Potassium: 5.1 mmol/L (ref 3.5–5.1)
SODIUM: 139 mmol/L (ref 135–145)

## 2016-01-21 LAB — CBC
HCT: 34.5 % — ABNORMAL LOW (ref 36.0–46.0)
HEMOGLOBIN: 10.9 g/dL — AB (ref 12.0–15.0)
MCH: 26.5 pg (ref 26.0–34.0)
MCHC: 31.6 g/dL (ref 30.0–36.0)
MCV: 83.7 fL (ref 78.0–100.0)
PLATELETS: 173 10*3/uL (ref 150–400)
RBC: 4.12 MIL/uL (ref 3.87–5.11)
RDW: 14 % (ref 11.5–15.5)
WBC: 4.2 10*3/uL (ref 4.0–10.5)

## 2016-01-21 LAB — SURGICAL PCR SCREEN
MRSA, PCR: NEGATIVE
STAPHYLOCOCCUS AUREUS: NEGATIVE

## 2016-01-21 LAB — GLUCOSE, CAPILLARY: GLUCOSE-CAPILLARY: 115 mg/dL — AB (ref 65–99)

## 2016-01-21 NOTE — Progress Notes (Signed)
PCP - Dr. Octavio Graves at [facility] Cardiologist - Dr. Bronson Ing  EKG - 09/22/15 CXR - denies  Echo - scheduled for 01/26/16 Stress test - 01/08/15 Cardiac Cath - denies  Patient denies chest pain and shortness of breath at PAT appointment.

## 2016-01-22 LAB — HEMOGLOBIN A1C
Hgb A1c MFr Bld: 6.4 % — ABNORMAL HIGH (ref 4.8–5.6)
MEAN PLASMA GLUCOSE: 137 mg/dL

## 2016-01-24 NOTE — Progress Notes (Addendum)
Anesthesia Chart Review: Patient is a 65 year old female scheduled for right THA on 02/01/16 by Dr. Durward Fortes.  PMH includes: HTN, DM2, CKD, moderate mitral insufficiency (by 12/2014 echo), anemia, hypothyroidism, GERD, anxiety, asthma, SOB, PSVT, HLD, tinnitus, Stevens-Johnson disease (not otherwise specified). S/P left THA 02/02/15. Never smoker. BMI 33.97.  PCP is Dr. Octavio Graves at [facility]. Cardiologist is Dr. Bronson Ing. On 01/03/16, he wrote, "She may proceed to the OR without further evaluation at this time."  Medications include: albuterol, Xanax, ASA 81mg , Lipitor, Celexa, clonidine, Allegra, folic acid, ,Lasix, Norco, hydroxyzine, levothyroxine, Atrovent, magnesium, quinapril, pioglitazone, Prilosec, Singulair, metoprolol, Reglan, Robaxin.   EKG 09/22/15: sinus brady (59 bpm). Low voltage in precordial leads, non-specific ST-t changes.  Nuclear stress test 01/08/2015: -Myocardial perfusion is normal. The study is normal. This is a low risk study. Overall left ventricular systolic function was normal. LV cavity size is normal. Nuclear stress EF: 73%. The left ventricular ejection fraction is hyperdynamic (>65%). There is no prior study for comparison.  Echo 01/08/2015:  - Left ventricle: The cavity size was normal. Wall thickness was normal. Systolic function was normal. The estimated ejection fraction was in the range of 55% to 60%. Wall motion was normal; there were no regional wall motion abnormalities. Features are consistent with a pseudonormal left ventricular filling pattern, with concomitant abnormal relaxation and increased filling pressure (grade 2 diastolic dysfunction). Doppler parameters are consistent with high ventricular filling pressure. - Mitral valve: There was moderate regurgitation. - Left atrium: The atrium was mildly to moderately dilated. - Right atrium: The atrium was mildly dilated. - Pulmonary arteries: Systolic pressure was mildly increased. PA  peak pressure: 37 mm Hg (S). -Impressions: Normal LV function; grade 2 diastolic dysfunction with elevated LV filling pressure; mild to moderate LAE; mild RAE; thickened mitral valve with moderate MR; mild TR; mildly elevated pulmonary pressure.  Preoperative labs noted. A1c 6.4.   Patient has been cleared by cardiology. She does have an echo scheduled for 01/26/16, so I'll leave chart for follow-up results.  George Hugh [facility] Short Stay Center/Anesthesiology Phone [phone] 01/24/2016 5:01 PM  Addendum: [facility] echo (see below) appears to have stable findings since 2016. TR has progressed to mild to moderate. If no acute changes then I would anticipate that she could proceed as planned.   01/26/16 Echo: Impressions: - Normal LV wall thickness with LVEF 60-65%. Grade 2 diastolic dysfunction with increased LV filling pressure. Mild left atrial enlargement. Mildly thickened and calcified mitral valve with moderate mitral regurgitation. Mild to moderate tricuspid regurgitation with PASP 38 mmHg.  George Hugh [facility] Short Stay Center/Anesthesiology Phone [phone] 01/27/2016 3:59 PM

## 2016-01-24 NOTE — H&P (Addendum)
CHIEF COMPLAINT:  Painful right hip.   HISTORY OF PRESENT ILLNESS:  [name] is a very pleasant 65 year old African American female who is seen today for evaluation of her right hip and groin pain.  She has had her left total hip arthroplasty performed in August 2016 and had done extremely well with that.  She had been noted to have avascular necrosis and osteoarthritis of the right hip at the time of her surgery on her left.  She has gotten now to the point where it has become progressive to where she is using a cane and has apparently been given narcotics by Dr. Carmie End office, and according to the paperwork she was given hydrocodone 1 to 2 tablets of 10/325 mg 1 to 2 every 6 hours as needed for pain.  She is now worsening and is having difficulty with sleeping at night as well as activities of daily living.  Her pain is now moderate to severe and can go as high as 8 or 9 without the pain medicine.  She walks with an antalgic limp, and it has gotten to the point now where she cannot maneuver well enough to enjoy her life.  She has now gotten to the point where radiographically she has had progressive collapse of her right femoral head, and it is completely involved with significant osteoarthritic changes.  Seen today for evaluation.   PAST MEDICAL HISTORY:  In general, her health is good.   HOSPITALIZATIONS:  In 1986 for total abdominal hysterectomy.  Small-bowel resection for a small-bowel obstruction, date unknown.  In 1997 for hemorrhoidectomy.  Hiatal hernia repair, date unknown.  Anemia in 2014.   MEDICATIONS:  Hydrocodone/acetaminophen 10/325 mg 1 to 2 tabs every 6 hours as needed for pain, metoprolol 100 mg b.i.d., alprazolam 0.5 mg 1 tablet t.i.d., Montelukast 10 mg 1 tablet at bedtime, quinapril 40 mg 1 daily, aspirin 81 mg daily, citalopram 20 mg 2 tablets daily, clonidine 0.2 mg 1/2 to 1 tablet b.i.d., vitamin D 50,000 units 1 tablet twice weekly, hydroxyzine 10 mg t.i.d. for severe itching,  Lasix 40 mg daily, omeprazole 40 mg daily; ipratropium bromide 0000000, 2 applications each nostril p.r.n. b.i.d.; Ventolin inhaler 108 mcg 2 puffs once a day, pioglitazone 30 mg 1 daily, levothyroxine 88 mcg daily, metoclopramide 10 mg before meal and at bedtime, atorvastatin 20 mg daily, magnesium 400 mg daily, Singulair 10 mg in the evening daily, methocarbamol 500 mg 1.5 tablets every 4 hours p.r.n.   ALLERGIES:  PERCOCET which causes her a rash.  She does itch with the HYDROCODONE.  She can only take an 81 mg ASPIRIN because if she takes the 325 mg, it causes acid reflux.  She is not able to take IBUPROFEN or ANTI-INFLAMMATORIES because of renal insufficiency.  SULFA does cause a rash and slows her breathing.   FOURTEEN-POINT REVIEW OF SYSTEMS:  Unremarkable except for glasses for her vision. She does have occasional shortness of breath especially with exertion.  She has had hypertension since she was 65 years of age.  She does have palpitations noted.  She is cared for by a cardiologist, and she states that she does have problems with her left ventricle not providing enough blood to the body.  She does have a history of asthma and does use inhalers.  She has been a diabetic for more than 11 years.  Hypothyroid since age 52, on levothyroxine.  She does have a history of anemia and actually did have to have several units of  blood after her last total hip.  There is a question of whether she had renal failure for 3 years based on medications, but she states that has resolved.  She does have a history of depression and  nervous tension.   FAMILY HISTORY:  Positive for mother who was deceased at age 65.  This was from renal failure, diabetes mellitus, leukemia, and cardiac issues.  She did have a bleeding disorder and did have heart disease as well as hypertension, diabetes, renal disease, seizures, and a cancer.  Father died at age 49 from cancer of the lung with metastatic disease.  He, too, had a bleeding  disorder and hypertension as well as diabetes.  Did have strokes.  She has 3 brothers alive ages 17, 72, and 74.  She has 1 that has deceased from metastatic cancer of the lung.  She has 1 sister alive at 21 who has hypertension and strokes.   SOCIAL HISTORY:  [name] is a very pleasant 66 year old African American female who is single.  She is retired as a Diplomatic Services operational officer at [employer]  She denies the use of tobacco or alcohol.    PHYSICAL EXAMINATION:   General:  Today reveals a pleasant 65 year old African American female well developed, well nourished, obese, alert, pleasant, cooperative in moderate distress secondary to right hip pain.  Height is 5 feet 4 inches.  Weight 201 pounds.  BMI 34.5.  Vital Signs:  Reveals temperature of 98.3, pulse 80, respirations 14, blood pressure 148/76. Head:  Normocephalic. Eyes:  Pupils equal, round, and reactive to light and accommodation with extraocular movements intact. Ears/Nose/Throat:  Benign. Neck:  Supple.  No carotid bruits were noted. Chest:  Had good expansion. Lungs:  Essentially clear. Cardiac:  Had a regular rhythm and rate with occasional ectopy.  There was a grade 1 to 2 systolic murmur in the precordium. Pulses:  Were 1+, bilateral and symmetric, in the lower extremities. Abdomen:  Obese, soft, nontender.  No mass palpable.  Normal bowel sounds present. CNS:  Oriented x3, and cranial nerves II through XII grossly intact. Breast/Rectal/Genital:  Not indicated for an orthopedic evaluation. Musculoskeletal:  She has very painful range of motion of the right hip.  She only has about 10 to 15 degrees of internal and external rotation.  She has a limp with ambulation.    RADIOGRAPHS:  Radiographic studies do reveal progressive collapse of the right femoral head and is completely involved with significant osteoarthritic changes.   CLINICAL IMPRESSION:   1.  AVN of the right hip. 2.  Status post left total hip arthroplasty  with excellent results. 3.  Diabetes, non-insulin dependent. 4.  Hypothyroidism. 5.  Hypertension. 6.  Palpitations. 7.  Cardiac murmur. 8.  Depression. 9.  Chronic kidney disease. 10.  Anemia. 11.  Chronic narcotic pain medicine.   RECOMMENDATIONS:   1.  At this time I have reviewed a cardiac form from Dr. Bronson Ing, and apparently she is going to have an echocardiogram done within the next week.  Otherwise he felt that she would probably be cleared; however, that would be the final test needed.  Medically, she has already been considered a viable person for right total hip arthroplasty. 2.  At this time our plan is to proceed in the near future for a right total hip arthroplasty.  Procedure, risks, and benefits were explained to her again, showing the appropriate models and prosthesis.  Once she has her echocardiogram, then we can certainly proceed with total  hip replacement on the right.    Mike Craze [place], [place] [phone]  01/31/2016 10:27 PM

## 2016-01-26 ENCOUNTER — Ambulatory Visit (INDEPENDENT_AMBULATORY_CARE_PROVIDER_SITE_OTHER): Payer: BLUE CROSS/BLUE SHIELD

## 2016-01-26 ENCOUNTER — Other Ambulatory Visit: Payer: Self-pay

## 2016-01-26 DIAGNOSIS — I340 Nonrheumatic mitral (valve) insufficiency: Secondary | ICD-10-CM

## 2016-01-31 NOTE — Anesthesia Preprocedure Evaluation (Addendum)
Anesthesia Evaluation  Patient identified by MRN, date of birth, ID band Patient awake    Reviewed: Allergy & Precautions, NPO status , Patient's Chart, lab work & pertinent test results, reviewed documented beta blocker date and time   History of Anesthesia Complications Negative for: history of anesthetic complications  Airway Mallampati: III  TM Distance: >3 FB Neck ROM: Full    Dental  (+) Teeth Intact, Dental Advisory Given, Missing   Pulmonary shortness of breath, asthma ,    Pulmonary exam normal breath sounds clear to auscultation       Cardiovascular hypertension, Pt. on home beta blockers and Pt. on medications + dysrhythmias Supra Ventricular Tachycardia + Valvular Problems/Murmurs (moderate MR) MR  Rhythm:Regular Rate:Normal + Systolic murmurs EF 0000000   Neuro/Psych PSYCHIATRIC DISORDERS Anxiety Depression negative neurological ROS     GI/Hepatic Neg liver ROS, GERD  Medicated,  Endo/Other  diabetes, Type 2, Oral Hypoglycemic AgentsHypothyroidism Obesity   Renal/GU Renal disease     Musculoskeletal  (+) Arthritis , Osteoarthritis,    Abdominal   Peds  Hematology  (+) Blood dyscrasia, anemia ,   Anesthesia Other Findings Day of surgery medications reviewed with the patient.  Reproductive/Obstetrics                           Anesthesia Physical Anesthesia Plan  ASA: III  Anesthesia Plan: General   Post-op Pain Management:    Induction: Intravenous  Airway Management Planned: Oral ETT  Additional Equipment:   Intra-op Plan:   Post-operative Plan: Extubation in OR  Informed Consent: I have reviewed the patients History and Physical, chart, labs and discussed the procedure including the risks, benefits and alternatives for the proposed anesthesia with the patient or authorized representative who has indicated his/her understanding and acceptance.   Dental advisory  given and Dental Advisory Given  Plan Discussed with: CRNA and Anesthesiologist  Anesthesia Plan Comments: (Risks/benefits of general anesthesia discussed with patient including risk of damage to teeth, lips, gum, and tongue, nausea/vomiting, allergic reactions to medications, and the possibility of heart attack, stroke and death.  All patient questions answered.  Patient wishes to proceed.)       Anesthesia Quick Evaluation                                   Anesthesia Evaluation  Patient identified by MRN, date of birth, ID band Patient awake    Reviewed: Allergy & Precautions, NPO status , Patient's Chart, lab work & pertinent test results, reviewed documented beta blocker date and time , Unable to perform ROS - Chart review only  Airway Mallampati: II  TM Distance: >3 FB Neck ROM: Full    Dental  (+) Dental Advisory Given   Pulmonary neg pulmonary ROS,  breath sounds clear to auscultation        Cardiovascular hypertension, Pt. on medications and Pt. on home beta blockers + Valvular Problems/Murmurs MR Rhythm:Regular Rate:Normal  01/05/15 Low risk myocardial perfusion study.  NL EF. Moderate MR   Neuro/Psych Anxiety Depression negative neurological ROS     GI/Hepatic Neg liver ROS, GERD-  ,  Endo/Other  diabetes, Type 2Hypothyroidism   Renal/GU CRFRenal disease     Musculoskeletal  (+) Arthritis -,   Abdominal   Peds  Hematology  (+) anemia ,   Anesthesia Other Findings   Reproductive/Obstetrics  Anesthesia Physical Anesthesia Plan  ASA: III  Anesthesia Plan: General   Post-op Pain Management:    Induction: Intravenous  Airway Management Planned: Oral ETT  Additional Equipment:   Intra-op Plan:   Post-operative Plan: Extubation in OR  Informed Consent: I have reviewed the patients History and Physical, chart, labs and discussed the procedure including the risks, benefits and  alternatives for the proposed anesthesia with the patient or authorized representative who has indicated his/her understanding and acceptance.   Dental advisory given  Plan Discussed with: CRNA  Anesthesia Plan Comments:        Anesthesia Quick Evaluation

## 2016-02-01 ENCOUNTER — Inpatient Hospital Stay (HOSPITAL_COMMUNITY): Payer: BLUE CROSS/BLUE SHIELD

## 2016-02-01 ENCOUNTER — Encounter (HOSPITAL_COMMUNITY): Payer: Self-pay | Admitting: General Practice

## 2016-02-01 ENCOUNTER — Inpatient Hospital Stay (HOSPITAL_COMMUNITY): Payer: BLUE CROSS/BLUE SHIELD | Admitting: Vascular Surgery

## 2016-02-01 ENCOUNTER — Encounter (HOSPITAL_COMMUNITY)
Admission: RE | Disposition: A | Discharge status: Home Health | Payer: Self-pay | Source: Ambulatory Visit | Attending: Orthopaedic Surgery

## 2016-02-01 ENCOUNTER — Inpatient Hospital Stay (HOSPITAL_COMMUNITY)
Admission: RE | Admit: 2016-02-01 | Discharge: 2016-02-04 | DRG: 470 | Disposition: A | Discharge status: Home Health | Payer: BLUE CROSS/BLUE SHIELD | Source: Ambulatory Visit | Attending: Orthopaedic Surgery | Admitting: Orthopaedic Surgery

## 2016-02-01 ENCOUNTER — Inpatient Hospital Stay (HOSPITAL_COMMUNITY): Payer: BLUE CROSS/BLUE SHIELD | Admitting: Anesthesiology

## 2016-02-01 DIAGNOSIS — Z9889 Other specified postprocedural states: Secondary | ICD-10-CM

## 2016-02-01 DIAGNOSIS — F329 Major depressive disorder, single episode, unspecified: Secondary | ICD-10-CM | POA: Diagnosis present

## 2016-02-01 DIAGNOSIS — Z96642 Presence of left artificial hip joint: Secondary | ICD-10-CM | POA: Diagnosis present

## 2016-02-01 DIAGNOSIS — M1611 Unilateral primary osteoarthritis, right hip: Principal | ICD-10-CM | POA: Diagnosis present

## 2016-02-01 DIAGNOSIS — Z96649 Presence of unspecified artificial hip joint: Secondary | ICD-10-CM

## 2016-02-01 DIAGNOSIS — J45909 Unspecified asthma, uncomplicated: Secondary | ICD-10-CM | POA: Diagnosis present

## 2016-02-01 DIAGNOSIS — K219 Gastro-esophageal reflux disease without esophagitis: Secondary | ICD-10-CM | POA: Diagnosis present

## 2016-02-01 DIAGNOSIS — Z7951 Long term (current) use of inhaled steroids: Secondary | ICD-10-CM | POA: Diagnosis not present

## 2016-02-01 DIAGNOSIS — E669 Obesity, unspecified: Secondary | ICD-10-CM | POA: Diagnosis present

## 2016-02-01 DIAGNOSIS — E1122 Type 2 diabetes mellitus with diabetic chronic kidney disease: Secondary | ICD-10-CM | POA: Diagnosis present

## 2016-02-01 DIAGNOSIS — N189 Chronic kidney disease, unspecified: Secondary | ICD-10-CM | POA: Diagnosis present

## 2016-02-01 DIAGNOSIS — Z79891 Long term (current) use of opiate analgesic: Secondary | ICD-10-CM | POA: Diagnosis not present

## 2016-02-01 DIAGNOSIS — E785 Hyperlipidemia, unspecified: Secondary | ICD-10-CM | POA: Diagnosis present

## 2016-02-01 DIAGNOSIS — I129 Hypertensive chronic kidney disease with stage 1 through stage 4 chronic kidney disease, or unspecified chronic kidney disease: Secondary | ICD-10-CM | POA: Diagnosis present

## 2016-02-01 DIAGNOSIS — Z7984 Long term (current) use of oral hypoglycemic drugs: Secondary | ICD-10-CM

## 2016-02-01 DIAGNOSIS — M109 Gout, unspecified: Secondary | ICD-10-CM | POA: Diagnosis present

## 2016-02-01 DIAGNOSIS — R32 Unspecified urinary incontinence: Secondary | ICD-10-CM | POA: Diagnosis present

## 2016-02-01 DIAGNOSIS — M879 Osteonecrosis, unspecified: Secondary | ICD-10-CM | POA: Diagnosis present

## 2016-02-01 DIAGNOSIS — Z6833 Body mass index (BMI) 33.0-33.9, adult: Secondary | ICD-10-CM | POA: Diagnosis not present

## 2016-02-01 DIAGNOSIS — D62 Acute posthemorrhagic anemia: Secondary | ICD-10-CM | POA: Diagnosis not present

## 2016-02-01 DIAGNOSIS — F419 Anxiety disorder, unspecified: Secondary | ICD-10-CM | POA: Diagnosis present

## 2016-02-01 DIAGNOSIS — Z7982 Long term (current) use of aspirin: Secondary | ICD-10-CM

## 2016-02-01 DIAGNOSIS — E039 Hypothyroidism, unspecified: Secondary | ICD-10-CM | POA: Diagnosis present

## 2016-02-01 DIAGNOSIS — R072 Precordial pain: Secondary | ICD-10-CM | POA: Diagnosis not present

## 2016-02-01 DIAGNOSIS — M25551 Pain in right hip: Secondary | ICD-10-CM | POA: Diagnosis present

## 2016-02-01 HISTORY — PX: TOTAL HIP ARTHROPLASTY: SHX124

## 2016-02-01 LAB — GLUCOSE, CAPILLARY
GLUCOSE-CAPILLARY: 152 mg/dL — AB (ref 65–99)
GLUCOSE-CAPILLARY: 133 mg/dL — AB (ref 65–99)
Glucose-Capillary: 111 mg/dL — ABNORMAL HIGH (ref 65–99)
Glucose-Capillary: 187 mg/dL — ABNORMAL HIGH (ref 65–99)
Glucose-Capillary: 145 mg/dL — ABNORMAL HIGH (ref 65–99)

## 2016-02-01 SURGERY — ARTHROPLASTY, HIP, TOTAL,POSTERIOR APPROACH
Anesthesia: General | Site: Hip | Laterality: Right

## 2016-02-01 MED ORDER — CROMOLYN SODIUM 5.2 MG/ACT NA AERS
1.0000 | INHALATION_SPRAY | Freq: Every day | NASAL | Status: DC | PRN
  Filled 2016-02-01: qty 13

## 2016-02-01 MED ORDER — ACETAMINOPHEN 10 MG/ML IV SOLN
INTRAVENOUS | Status: DC | PRN
  Administered 2016-02-01: 1000 mg via INTRAVENOUS

## 2016-02-01 MED ORDER — SALINE SPRAY 0.65 % NA SOLN
1.0000 | NASAL | Status: DC | PRN
  Filled 2016-02-01: qty 44

## 2016-02-01 MED ORDER — HYDROXYZINE HCL 10 MG PO TABS
10.0000 mg | ORAL_TABLET | Freq: Four times a day (QID) | ORAL | Status: DC | PRN
  Filled 2016-02-01: qty 1

## 2016-02-01 MED ORDER — CITALOPRAM HYDROBROMIDE 20 MG PO TABS
20.0000 mg | ORAL_TABLET | Freq: Every day | ORAL | Status: DC
Start: 2016-02-02 — End: 2016-02-04
  Administered 2016-02-02 – 2016-02-04 (×3): 20 mg via ORAL
  Filled 2016-02-01 (×4): qty 1

## 2016-02-01 MED ORDER — CEFAZOLIN SODIUM-DEXTROSE 2-4 GM/100ML-% IV SOLN
INTRAVENOUS | Status: AC
  Filled 2016-02-01: qty 100

## 2016-02-01 MED ORDER — MONTELUKAST SODIUM 10 MG PO TABS
10.0000 mg | ORAL_TABLET | Freq: Every day | ORAL | Status: DC
  Administered 2016-02-02 – 2016-02-03 (×2): 10 mg via ORAL
  Filled 2016-02-01 (×2): qty 1

## 2016-02-01 MED ORDER — MAGNESIUM CITRATE PO SOLN: 1.0000 | Freq: Once | ORAL | Status: DC | PRN

## 2016-02-01 MED ORDER — METOCLOPRAMIDE HCL 10 MG PO TABS
10.0000 mg | ORAL_TABLET | Freq: Two times a day (BID) | ORAL | Status: DC
  Administered 2016-02-01 – 2016-02-04 (×6): 10 mg via ORAL
  Filled 2016-02-01 (×5): qty 1

## 2016-02-01 MED ORDER — POLYETHYLENE GLYCOL 3350 17 G PO PACK: 17.0000 g | PACK | Freq: Every day | ORAL | Status: DC | PRN

## 2016-02-01 MED ORDER — PANTOPRAZOLE SODIUM 40 MG PO TBEC
80.0000 mg | DELAYED_RELEASE_TABLET | Freq: Every day | ORAL | Status: DC
  Administered 2016-02-01 – 2016-02-04 (×4): 80 mg via ORAL
  Filled 2016-02-01 (×4): qty 2

## 2016-02-01 MED ORDER — FENTANYL CITRATE (PF) 100 MCG/2ML IJ SOLN
25.0000 ug | INTRAMUSCULAR | Status: DC | PRN
  Administered 2016-02-01 (×2): 25 ug via INTRAVENOUS

## 2016-02-01 MED ORDER — LIDOCAINE HCL (CARDIAC) 20 MG/ML IV SOLN
INTRAVENOUS | Status: DC | PRN
  Administered 2016-02-01: 80 mg via INTRAVENOUS

## 2016-02-01 MED ORDER — PHENOL 1.4 % MT LIQD: 1.0000 | OROMUCOSAL | Status: DC | PRN

## 2016-02-01 MED ORDER — SODIUM CHLORIDE 0.9 % IR SOLN
Status: DC | PRN
  Administered 2016-02-01: 2000 mL

## 2016-02-01 MED ORDER — FENTANYL CITRATE (PF) 100 MCG/2ML IJ SOLN
INTRAMUSCULAR | Status: AC
  Administered 2016-02-01: 50 ug via INTRAVENOUS
  Filled 2016-02-01: qty 2

## 2016-02-01 MED ORDER — IPRATROPIUM BROMIDE 0.03 % NA SOLN
2.0000 | Freq: Two times a day (BID) | NASAL | Status: DC | PRN
  Filled 2016-02-01: qty 30

## 2016-02-01 MED ORDER — CLONIDINE HCL 0.1 MG PO TABS
0.1000 mg | ORAL_TABLET | Freq: Two times a day (BID) | ORAL | Status: DC
  Administered 2016-02-01 – 2016-02-04 (×6): 0.1 mg via ORAL
  Filled 2016-02-01 (×6): qty 1

## 2016-02-01 MED ORDER — LEVOTHYROXINE SODIUM 88 MCG PO TABS
88.0000 ug | ORAL_TABLET | Freq: Every day | ORAL | Status: DC
  Administered 2016-02-02 – 2016-02-04 (×3): 88 ug via ORAL
  Filled 2016-02-01 (×3): qty 1

## 2016-02-01 MED ORDER — ENSURE ENLIVE PO LIQD
237.0000 mL | Freq: Every day | ORAL | Status: DC
  Administered 2016-02-02: 237 mL via ORAL

## 2016-02-01 MED ORDER — FENTANYL CITRATE (PF) 100 MCG/2ML IJ SOLN
INTRAMUSCULAR | Status: AC
  Filled 2016-02-01: qty 2

## 2016-02-01 MED ORDER — HYDROCODONE-ACETAMINOPHEN 5-325 MG PO TABS
ORAL_TABLET | ORAL | Status: AC
  Administered 2016-02-01: 2 via ORAL
  Filled 2016-02-01: qty 2

## 2016-02-01 MED ORDER — CEFAZOLIN SODIUM-DEXTROSE 2-3 GM-% IV SOLR
INTRAVENOUS | Status: DC | PRN
  Administered 2016-02-01: 2 g via INTRAVENOUS

## 2016-02-01 MED ORDER — MIDAZOLAM HCL 2 MG/2ML IJ SOLN
INTRAMUSCULAR | Status: AC
  Filled 2016-02-01: qty 2

## 2016-02-01 MED ORDER — FOLIC ACID 0.5 MG HALF TAB
500.0000 ug | ORAL_TABLET | Freq: Every day | ORAL | Status: DC
  Administered 2016-02-01: 0.5 mg via ORAL
  Filled 2016-02-01 (×2): qty 1

## 2016-02-01 MED ORDER — ATORVASTATIN CALCIUM 10 MG PO TABS
10.0000 mg | ORAL_TABLET | Freq: Every day | ORAL | Status: DC
  Administered 2016-02-01 – 2016-02-04 (×4): 10 mg via ORAL
  Filled 2016-02-01 (×4): qty 1

## 2016-02-01 MED ORDER — ONDANSETRON HCL 4 MG/2ML IJ SOLN
INTRAMUSCULAR | Status: DC | PRN
  Administered 2016-02-01: 4 mg via INTRAVENOUS

## 2016-02-01 MED ORDER — PROPOFOL 10 MG/ML IV BOLUS
INTRAVENOUS | Status: DC | PRN
  Administered 2016-02-01: 150 mg via INTRAVENOUS

## 2016-02-01 MED ORDER — EPHEDRINE SULFATE 50 MG/ML IJ SOLN
INTRAMUSCULAR | Status: DC | PRN
  Administered 2016-02-01: 10 mg via INTRAVENOUS

## 2016-02-01 MED ORDER — CEFAZOLIN SODIUM-DEXTROSE 2-4 GM/100ML-% IV SOLN
2.0000 g | Freq: Four times a day (QID) | INTRAVENOUS | Status: AC
  Administered 2016-02-02 (×2): 2 g via INTRAVENOUS
  Filled 2016-02-01 (×3): qty 100

## 2016-02-01 MED ORDER — HYDROMORPHONE HCL 1 MG/ML IJ SOLN
0.5000 mg | INTRAMUSCULAR | Status: DC | PRN
  Administered 2016-02-01: 1 mg via INTRAVENOUS
  Filled 2016-02-01: qty 1

## 2016-02-01 MED ORDER — SIMETHICONE 80 MG PO CHEW: 120.0000 mg | CHEWABLE_TABLET | Freq: Four times a day (QID) | ORAL | Status: DC | PRN

## 2016-02-01 MED ORDER — TRANEXAMIC ACID 1000 MG/10ML IV SOLN
2000.0000 mg | INTRAVENOUS | Status: DC
  Filled 2016-02-01: qty 20

## 2016-02-01 MED ORDER — ONDANSETRON HCL 4 MG/2ML IJ SOLN: 4.0000 mg | Freq: Once | INTRAMUSCULAR | Status: DC | PRN

## 2016-02-01 MED ORDER — FENTANYL CITRATE (PF) 100 MCG/2ML IJ SOLN
25.0000 ug | INTRAMUSCULAR | Status: DC | PRN
  Administered 2016-02-01 (×3): 25 ug via INTRAVENOUS
  Administered 2016-02-01: 50 ug via INTRAVENOUS
  Administered 2016-02-01: 25 ug via INTRAVENOUS

## 2016-02-01 MED ORDER — PHENYLEPHRINE HCL 10 MG/ML IJ SOLN
INTRAMUSCULAR | Status: DC | PRN
  Administered 2016-02-01 (×3): 40 ug via INTRAVENOUS

## 2016-02-01 MED ORDER — METHOCARBAMOL 1000 MG/10ML IJ SOLN
500.0000 mg | Freq: Four times a day (QID) | INTRAVENOUS | Status: DC | PRN
  Administered 2016-02-01: 500 mg via INTRAVENOUS
  Filled 2016-02-01 (×2): qty 5

## 2016-02-01 MED ORDER — FENTANYL CITRATE (PF) 100 MCG/2ML IJ SOLN
INTRAMUSCULAR | Status: AC
Start: 2016-02-01 — End: 2016-02-01
  Filled 2016-02-01: qty 2

## 2016-02-01 MED ORDER — ROCURONIUM BROMIDE 100 MG/10ML IV SOLN
INTRAVENOUS | Status: DC | PRN
  Administered 2016-02-01: 50 mg via INTRAVENOUS

## 2016-02-01 MED ORDER — HYDROCODONE-ACETAMINOPHEN 5-325 MG PO TABS
1.0000 | ORAL_TABLET | ORAL | Status: DC | PRN
  Administered 2016-02-01 – 2016-02-03 (×9): 2 via ORAL
  Administered 2016-02-03: 1 via ORAL
  Administered 2016-02-03 – 2016-02-04 (×3): 2 via ORAL
  Filled 2016-02-01 (×2): qty 2
  Filled 2016-02-01: qty 1
  Filled 2016-02-01 (×10): qty 2

## 2016-02-01 MED ORDER — MAGNESIUM OXIDE 400 (241.3 MG) MG PO TABS
400.0000 mg | ORAL_TABLET | Freq: Every day | ORAL | Status: DC
  Administered 2016-02-01 – 2016-02-04 (×4): 400 mg via ORAL
  Filled 2016-02-01 (×4): qty 1

## 2016-02-01 MED ORDER — METOPROLOL TARTRATE 100 MG PO TABS
100.0000 mg | ORAL_TABLET | Freq: Two times a day (BID) | ORAL | Status: DC
  Administered 2016-02-01 – 2016-02-04 (×6): 100 mg via ORAL
  Filled 2016-02-01 (×6): qty 1

## 2016-02-01 MED ORDER — FENTANYL CITRATE (PF) 100 MCG/2ML IJ SOLN
INTRAMUSCULAR | Status: DC | PRN
  Administered 2016-02-01 (×4): 50 ug via INTRAVENOUS
  Administered 2016-02-01: 100 ug via INTRAVENOUS
  Administered 2016-02-01 (×2): 50 ug via INTRAVENOUS

## 2016-02-01 MED ORDER — TRANEXAMIC ACID 1000 MG/10ML IV SOLN
INTRAVENOUS | Status: DC | PRN
  Administered 2016-02-01: 2000 mg via TOPICAL

## 2016-02-01 MED ORDER — BISACODYL 10 MG RE SUPP: 10.0000 mg | Freq: Every day | RECTAL | Status: DC | PRN

## 2016-02-01 MED ORDER — MENTHOL 3 MG MT LOZG
1.0000 | LOZENGE | OROMUCOSAL | Status: DC | PRN
Start: 2016-02-01 — End: 2016-02-04

## 2016-02-01 MED ORDER — INSULIN ASPART 100 UNIT/ML ~~LOC~~ SOLN: 0.0000 [IU] | Freq: Every day | SUBCUTANEOUS | Status: DC

## 2016-02-01 MED ORDER — POLYETHYL GLYCOL-PROPYL GLYCOL 0.4-0.3 % OP GEL
Freq: Every day | OPHTHALMIC | Status: DC | PRN
  Filled 2016-02-01: qty 10

## 2016-02-01 MED ORDER — QUINAPRIL HCL 10 MG PO TABS
40.0000 mg | ORAL_TABLET | Freq: Every day | ORAL | Status: DC
  Administered 2016-02-02 – 2016-02-04 (×3): 40 mg via ORAL
  Filled 2016-02-01 (×3): qty 4

## 2016-02-01 MED ORDER — FENTANYL CITRATE (PF) 100 MCG/2ML IJ SOLN
INTRAMUSCULAR | Status: AC
  Administered 2016-02-01: 25 ug via INTRAVENOUS
  Filled 2016-02-01: qty 2

## 2016-02-01 MED ORDER — BUPIVACAINE-EPINEPHRINE (PF) 0.25% -1:200000 IJ SOLN
INTRAMUSCULAR | Status: DC | PRN
  Administered 2016-02-01: 30 mL

## 2016-02-01 MED ORDER — LORATADINE 10 MG PO TABS
10.0000 mg | ORAL_TABLET | Freq: Every day | ORAL | Status: DC
  Administered 2016-02-02 – 2016-02-04 (×3): 10 mg via ORAL
  Filled 2016-02-01 (×4): qty 1

## 2016-02-01 MED ORDER — DOCUSATE SODIUM 100 MG PO CAPS
100.0000 mg | ORAL_CAPSULE | Freq: Two times a day (BID) | ORAL | Status: DC
  Administered 2016-02-01 – 2016-02-04 (×6): 100 mg via ORAL
  Filled 2016-02-01 (×6): qty 1

## 2016-02-01 MED ORDER — ACETAMINOPHEN 10 MG/ML IV SOLN
1000.0000 mg | Freq: Four times a day (QID) | INTRAVENOUS | Status: AC
  Administered 2016-02-01 – 2016-02-02 (×2): 1000 mg via INTRAVENOUS
  Filled 2016-02-01 (×4): qty 100

## 2016-02-01 MED ORDER — ALPRAZOLAM 0.5 MG PO TABS
0.5000 mg | ORAL_TABLET | Freq: Three times a day (TID) | ORAL | Status: DC
  Administered 2016-02-01 – 2016-02-04 (×8): 0.5 mg via ORAL
  Filled 2016-02-01 (×8): qty 1

## 2016-02-01 MED ORDER — SODIUM CHLORIDE 0.9 % IV SOLN
75.0000 mL/h | INTRAVENOUS | Status: DC
  Administered 2016-02-01: 75 mL/h via INTRAVENOUS
  Administered 2016-02-03: 20 mL/h via INTRAVENOUS

## 2016-02-01 MED ORDER — ALBUTEROL SULFATE (2.5 MG/3ML) 0.083% IN NEBU: 3.0000 mL | INHALATION_SOLUTION | Freq: Two times a day (BID) | RESPIRATORY_TRACT | Status: DC | PRN

## 2016-02-01 MED ORDER — FUROSEMIDE 40 MG PO TABS
40.0000 mg | ORAL_TABLET | Freq: Every day | ORAL | Status: DC
  Administered 2016-02-01 – 2016-02-04 (×3): 40 mg via ORAL
  Filled 2016-02-01 (×4): qty 1

## 2016-02-01 MED ORDER — ACETAMINOPHEN 10 MG/ML IV SOLN
INTRAVENOUS | Status: AC
  Filled 2016-02-01: qty 100

## 2016-02-01 MED ORDER — ONDANSETRON HCL 4 MG PO TABS: 4.0000 mg | ORAL_TABLET | Freq: Four times a day (QID) | ORAL | Status: DC | PRN

## 2016-02-01 MED ORDER — METHOCARBAMOL 500 MG PO TABS
500.0000 mg | ORAL_TABLET | Freq: Four times a day (QID) | ORAL | Status: DC | PRN
  Administered 2016-02-02 (×2): 500 mg via ORAL
  Filled 2016-02-01 (×3): qty 1

## 2016-02-01 MED ORDER — BUPIVACAINE-EPINEPHRINE (PF) 0.25% -1:200000 IJ SOLN
INTRAMUSCULAR | Status: AC
  Filled 2016-02-01: qty 30

## 2016-02-01 MED ORDER — INSULIN ASPART 100 UNIT/ML ~~LOC~~ SOLN
0.0000 [IU] | Freq: Three times a day (TID) | SUBCUTANEOUS | Status: DC
  Administered 2016-02-01 – 2016-02-02 (×3): 2 [IU] via SUBCUTANEOUS
  Administered 2016-02-02: 8 [IU] via SUBCUTANEOUS
  Administered 2016-02-03: 3 [IU] via SUBCUTANEOUS
  Administered 2016-02-03 – 2016-02-04 (×3): 2 [IU] via SUBCUTANEOUS
  Administered 2016-02-04: 3 [IU] via SUBCUTANEOUS

## 2016-02-01 MED ORDER — ONDANSETRON HCL 4 MG/2ML IJ SOLN: 4.0000 mg | Freq: Four times a day (QID) | INTRAMUSCULAR | Status: DC | PRN

## 2016-02-01 MED ORDER — SUGAMMADEX SODIUM 200 MG/2ML IV SOLN
INTRAVENOUS | Status: DC | PRN
  Administered 2016-02-01: 182.4 mg via INTRAVENOUS

## 2016-02-01 MED ORDER — ALUM & MAG HYDROXIDE-SIMETH 200-200-20 MG/5ML PO SUSP
30.0000 mL | Freq: Four times a day (QID) | ORAL | Status: DC | PRN
  Administered 2016-02-02: 30 mL via ORAL
  Filled 2016-02-01: qty 30

## 2016-02-01 MED ORDER — LACTATED RINGERS IV SOLN
INTRAVENOUS | Status: DC | PRN
  Administered 2016-02-01 (×3): via INTRAVENOUS

## 2016-02-01 MED ORDER — ALBUMIN HUMAN 5 % IV SOLN
INTRAVENOUS | Status: DC | PRN
  Administered 2016-02-01: 09:00:00 via INTRAVENOUS

## 2016-02-01 MED ORDER — RIVAROXABAN 10 MG PO TABS
10.0000 mg | ORAL_TABLET | Freq: Every day | ORAL | Status: DC
  Administered 2016-02-02 – 2016-02-04 (×3): 10 mg via ORAL
  Filled 2016-02-01 (×3): qty 1

## 2016-02-01 MED ORDER — DEXMEDETOMIDINE HCL 200 MCG/2ML IV SOLN
INTRAVENOUS | Status: DC | PRN
  Administered 2016-02-01: 12 ug via INTRAVENOUS

## 2016-02-01 SURGICAL SUPPLY — 54 items
BLADE SAW SAG 73X25 THK (BLADE) ×2
BLADE SAW SGTL 73X25 THK (BLADE) ×1 IMPLANT
BRUSH FEMORAL CANAL (MISCELLANEOUS) IMPLANT
CAPT HIP TOTAL 2 ×3 IMPLANT
COVER SURGICAL LIGHT HANDLE (MISCELLANEOUS) ×3 IMPLANT
DRAPE INCISE IOBAN 66X45 STRL (DRAPES) IMPLANT
DRAPE ORTHO SPLIT 77X108 STRL (DRAPES) ×4
DRAPE SURG ORHT 6 SPLT 77X108 (DRAPES) ×2 IMPLANT
DRSG MEPILEX BORDER 4X12 (GAUZE/BANDAGES/DRESSINGS) ×3 IMPLANT
DURAPREP 26ML APPLICATOR (WOUND CARE) ×6 IMPLANT
ELECT BLADE 6.5 EXT (BLADE) IMPLANT
ELECT REM PT RETURN 9FT ADLT (ELECTROSURGICAL) ×3
ELECTRODE REM PT RTRN 9FT ADLT (ELECTROSURGICAL) ×1 IMPLANT
EVACUATOR 1/8 PVC DRAIN (DRAIN) IMPLANT
FACESHIELD WRAPAROUND (MASK) ×9 IMPLANT
GLOVE BIOGEL PI IND STRL 8 (GLOVE) ×3 IMPLANT
GLOVE BIOGEL PI IND STRL 8.5 (GLOVE) ×1 IMPLANT
GLOVE BIOGEL PI INDICATOR 8 (GLOVE) ×6
GLOVE BIOGEL PI INDICATOR 8.5 (GLOVE) ×2
GLOVE ECLIPSE 8.0 STRL XLNG CF (GLOVE) ×9 IMPLANT
GLOVE SURG ORTHO 8.5 STRL (GLOVE) ×6 IMPLANT
GOWN STRL REUS W/ TWL LRG LVL3 (GOWN DISPOSABLE) ×2 IMPLANT
GOWN STRL REUS W/TWL 2XL LVL3 (GOWN DISPOSABLE) ×6 IMPLANT
GOWN STRL REUS W/TWL LRG LVL3 (GOWN DISPOSABLE) ×4
HANDPIECE INTERPULSE COAX TIP (DISPOSABLE)
IMMOBILIZER KNEE 20 (SOFTGOODS) IMPLANT
IMMOBILIZER KNEE 22 UNIV (SOFTGOODS) ×3 IMPLANT
KIT BASIN OR (CUSTOM PROCEDURE TRAY) ×3 IMPLANT
KIT ROOM TURNOVER OR (KITS) ×3 IMPLANT
MANIFOLD NEPTUNE II (INSTRUMENTS) ×3 IMPLANT
NEEDLE 22X1 1/2 (OR ONLY) (NEEDLE) ×3 IMPLANT
NS IRRIG 1000ML POUR BTL (IV SOLUTION) ×3 IMPLANT
PACK TOTAL JOINT (CUSTOM PROCEDURE TRAY) ×3 IMPLANT
PACK UNIVERSAL I (CUSTOM PROCEDURE TRAY) ×3 IMPLANT
PAD ARMBOARD 7.5X6 YLW CONV (MISCELLANEOUS) ×3 IMPLANT
PRESSURIZER FEMORAL UNIV (MISCELLANEOUS) IMPLANT
SET HNDPC FAN SPRY TIP SCT (DISPOSABLE) IMPLANT
STAPLER VISISTAT 35W (STAPLE) IMPLANT
SUCTION FRAZIER HANDLE 10FR (MISCELLANEOUS) ×2
SUCTION TUBE FRAZIER 10FR DISP (MISCELLANEOUS) ×1 IMPLANT
SUT BONE WAX W31G (SUTURE) IMPLANT
SUT ETHIBOND NAB CT1 #1 30IN (SUTURE) ×9 IMPLANT
SUT MNCRL AB 3-0 PS2 18 (SUTURE) ×3 IMPLANT
SUT VIC AB 0 CT1 27 (SUTURE) ×4
SUT VIC AB 0 CT1 27XBRD ANBCTR (SUTURE) ×2 IMPLANT
SUT VIC AB 1 CT1 27 (SUTURE) ×4
SUT VIC AB 1 CT1 27XBRD ANBCTR (SUTURE) ×2 IMPLANT
SUT VIC AB 2-0 CT1 27 (SUTURE) ×2
SUT VIC AB 2-0 CT1 TAPERPNT 27 (SUTURE) ×1 IMPLANT
SYR CONTROL 10ML LL (SYRINGE) ×3 IMPLANT
TOWEL OR 17X24 6PK STRL BLUE (TOWEL DISPOSABLE) ×3 IMPLANT
TOWEL OR 17X26 10 PK STRL BLUE (TOWEL DISPOSABLE) ×3 IMPLANT
TOWER CARTRIDGE SMART MIX (DISPOSABLE) IMPLANT
WATER STERILE IRR 1000ML POUR (IV SOLUTION) ×12 IMPLANT

## 2016-02-01 NOTE — H&P (Signed)
  The recent History & Physical has been reviewed. I have personally examined the patient today. There is no interval change to the documented History & Physical. The patient would like to proceed with the procedure.  [name] W 02/01/2016,  7:13 AM

## 2016-02-01 NOTE — Anesthesia Postprocedure Evaluation (Signed)
Anesthesia Post Note  Patient: [name]  Procedure(s) Performed: Procedure(s) (LRB): TOTAL HIP ARTHROPLASTY (Right)  Patient location during evaluation: PACU Anesthesia Type: General Level of consciousness: awake and alert Pain management: pain level controlled Vital Signs Assessment: post-procedure vital signs reviewed and stable Respiratory status: spontaneous breathing, nonlabored ventilation, respiratory function stable and patient connected to nasal cannula oxygen Cardiovascular status: blood pressure returned to baseline and stable Postop Assessment: no signs of nausea or vomiting Anesthetic complications: no    Last Vitals:  Vitals:   02/01/16 1415 02/01/16 1500  BP: (!) 138/92 120/66  Pulse: (!) 59 67  Resp: (!) 9 16  Temp:  36.7 C    Last Pain:  Vitals:   02/01/16 1500  TempSrc: Oral  PainSc:                  Catalina Gravel

## 2016-02-01 NOTE — Transfer of Care (Signed)
Immediate Anesthesia Transfer of Care Note  Patient: [name]  Procedure(s) Performed: Procedure(s): TOTAL HIP ARTHROPLASTY (Right)  Patient Location: PACU  Anesthesia Type:General  Level of Consciousness: awake, alert  and oriented  Airway & Oxygen Therapy: Patient Spontanous Breathing and Patient connected to nasal cannula oxygen  Post-op Assessment: Report given to RN, Post -op Vital signs reviewed and stable and Patient moving all extremities X 4  Post vital signs: Reviewed and stable  Last Vitals:  Vitals:   02/01/16 0620  BP: (!) 234/51  Pulse: 63  Resp: 20  Temp: 36.6 C    Last Pain:  Vitals:   02/01/16 0620  TempSrc: Oral      Patients Stated Pain Goal: 2 ([id number])  Complications: No apparent anesthesia complications

## 2016-02-01 NOTE — Progress Notes (Signed)
Orthopedic Tech Progress Note Patient Details:  [name] June 16, 1950 [id number]  Ortho Devices Type of Ortho Device: Knee Immobilizer   [name] 02/01/2016, 12:33 PM

## 2016-02-01 NOTE — Op Note (Signed)
PATIENT ID:      PINKEY KILLEEN  MRN:     [id number] DOB/AGE:    15-Mar-1951 / 65 y.o.       OPERATIVE REPORT    DATE OF PROCEDURE:  02/01/2016       PREOPERATIVE DIAGNOSIS:END STAGE   RIGHT HIP OSTEOARTHRITIS                                                       Estimated body mass index is 33.97 kg/m as calculated from the following:   Height as of this encounter: 5' 4.5" (1.638 m).   Weight as of this encounter: 91.2 kg (201 lb).     POSTOPERATIVE DIAGNOSIS:END STAGE   RIGHT HIP OSTEOARTHRITIS                                                                     Estimated body mass index is 33.97 kg/m as calculated from the following:   Height as of this encounter: 5' 4.5" (1.638 m).   Weight as of this encounter: 91.2 kg (201 lb).     PROCEDURE:  Procedure(s):RIGHT TOTAL HIP ARTHROPLASTY      SURGEON:  [name], MD    ASSISTANT:   [name], PA-C   (Present and scrubbed throughout the case, critical for assistance with exposure, retraction, instrumentation, and closure.)          ANESTHESIA: general     DRAINS: none :      TOURNIQUET TIME: * No tourniquets in log *    COMPLICATIONS:  None   CONDITION:  stable  PROCEDURE IN IT:[id number]   Durward Fortes, Tyana Butzer W 02/01/2016, 10:11 AM

## 2016-02-01 NOTE — Evaluation (Signed)
Physical Therapy Evaluation Patient Details Name: [name] MRN: [id number] DOB: Jun 05, 1951 Today's Date: 02/01/2016   History of Present Illness  patient is a 65 yo female, s/p R THA. (pmh includisve of previous left THA)  Clinical Impression  Patient demonstrates deficits in functional mobility as indicated below. Will need continued skilled PT to address deficits and maixmize function. Will see as indicated and progress as tolerated.     Follow Up Recommendations Home health PT;Supervision/Assistance - 24 hour    Equipment Recommendations  Other (comment) (pt has all equipment needed she reports)    Recommendations for Other Services       Precautions / Restrictions Precautions Precautions: Posterior Hip Precaution Booklet Issued: No Precaution Comments: did not review, pt lethargic Required Braces or Orthoses: Knee Immobilizer - Right      Mobility  Bed Mobility Overal bed mobility: Needs Assistance Bed Mobility: Supine to Sit     Supine to sit: Mod assist     General bed mobility comments: patient able to initiate movement to EOB, required assist for LE movement, manual control of RLE to adhere to posterior precautions. Use of UE to pull to sit with relinace on rail and therapist. Assist to rotate to EOB and scoot out with feet to floor.  Transfers Overall transfer level: Needs assistance Equipment used: Rolling walker (2 wheeled);None (RW from [facility], no device for pivot to [facility]) Transfers: Sit to/from [employer] to Stand: Min assist Stand pivot transfers: Min assist       General transfer comment: Vcs for hand placement, patient did not come to full upright position for pivot to [facility] despite cues (did not use RW).   Ambulation/Gait             General Gait Details: deferred at this time  Stairs            Wheelchair Mobility    Modified Rankin (Stroke Patients Only)       Balance Overall balance assessment: Needs  assistance   Sitting balance-Leahy Scale: Fair Sitting balance - Comments: able to sit on BSC self supported without difficulty   Standing balance support: Bilateral upper extremity supported Standing balance-Leahy Scale: Poor Standing balance comment: reliance on RW for UE support at this time                              Pertinent Vitals/Pain Pain Assessment: 0-10 Pain Score: 6  Pain Location: right hip and buttocks Pain Descriptors / Indicators: Sore;Guarding;Grimacing Pain Intervention(s): Limited activity within patient's tolerance;Monitored during session;Repositioned    Home Living Family/patient expects to be discharged to:: Private residence Living Arrangements: Other relatives Available Help at Discharge: Family;Available 24 hours/day Type of Home: House Home Access: Stairs to enter Entrance Stairs-Rails: Right Entrance Stairs-Number of Steps: 5 Home Layout: One level Home Equipment: Walker - 2 wheels;Bedside commode      Prior Function Level of Independence: Independent         Comments: intermittent use of cane     Hand Dominance   Dominant Hand: Right    Extremity/Trunk Assessment   Upper Extremity Assessment: Overall WFL for tasks assessed           Lower Extremity Assessment: RLE deficits/detail         Communication   Communication: No difficulties  Cognition Arousal/Alertness: Awake/alert (lethargic towards end of session) Behavior During Therapy: WFL for tasks assessed/performed Overall Cognitive Status: Within Functional  Limits for tasks assessed                      General Comments      Exercises        Assessment/Plan    PT Assessment Patient needs continued PT services  PT Diagnosis Difficulty walking;Abnormality of gait;Acute pain   PT Problem List Decreased strength;Decreased activity tolerance;Decreased balance;Decreased mobility;Decreased knowledge of use of DME;Decreased knowledge of  precautions;Pain  PT Treatment Interventions DME instruction;Gait training;Stair training;Functional mobility training;Therapeutic activities;Therapeutic exercise;Balance training;Patient/family education   PT Goals (Current goals can be found in the Care Plan section) Acute Rehab PT Goals Patient Stated Goal: to go home PT Goal Formulation: With patient/family Time For Goal Achievement: 02/15/16 Potential to Achieve Goals: Good    Frequency 7X/week   Barriers to discharge        Co-evaluation               End of Session Equipment Utilized During Treatment: Gait belt;Right knee immobilizer Activity Tolerance: Patient tolerated treatment well;Patient limited by fatigue;Patient limited by pain Patient left: in chair;with call bell/phone within reach;with chair alarm set;with nursing/sitter in room;with family/visitor present (IV team and nsg tech in room) Nurse Communication: Mobility status         Time: [id number] PT Time Calculation (min) (ACUTE ONLY): 21 min   Charges:   PT Evaluation $PT Eval Moderate Complexity: 1 Procedure     PT G CodesDuncan Dull February 26, 2016, 5:01 PM Alben Deeds, [place] DPT  [phone]

## 2016-02-01 NOTE — Anesthesia Procedure Notes (Signed)
Procedure Name: Intubation Date/Time: 02/01/2016 7:30 AM Performed by: Neldon Newport Pre-anesthesia Checklist: Timeout performed, Patient being monitored, Suction available, Emergency Drugs available and Patient identified Patient Re-evaluated:Patient Re-evaluated prior to inductionOxygen Delivery Method: Circle system utilized Preoxygenation: Pre-oxygenation with 100% oxygen Intubation Type: IV induction Ventilation: Mask ventilation without difficulty Laryngoscope Size: Mac and 3 Grade View: Grade I Tube type: Oral Tube size: 7.0 mm Number of attempts: 1 Placement Confirmation: breath sounds checked- equal and bilateral,  positive ETCO2 and ETT inserted through vocal cords under direct vision Secured at: 21 cm Tube secured with: Tape Dental Injury: Teeth and Oropharynx as per pre-operative assessment

## 2016-02-02 ENCOUNTER — Encounter (HOSPITAL_COMMUNITY): Payer: Self-pay

## 2016-02-02 LAB — GLUCOSE, CAPILLARY
GLUCOSE-CAPILLARY: 128 mg/dL — AB (ref 65–99)
Glucose-Capillary: 262 mg/dL — ABNORMAL HIGH (ref 65–99)
Glucose-Capillary: 131 mg/dL — ABNORMAL HIGH (ref 65–99)
Glucose-Capillary: 189 mg/dL — ABNORMAL HIGH (ref 65–99)

## 2016-02-02 LAB — CBC WITH DIFFERENTIAL/PLATELET
Basophils Absolute: 0 10*3/uL (ref 0.0–0.1)
Basophils Relative: 0 %
EOS PCT: 0 %
Eosinophils Absolute: 0 10*3/uL (ref 0.0–0.7)
HCT: 21.4 % — ABNORMAL LOW (ref 36.0–46.0)
HEMOGLOBIN: 6.8 g/dL — AB (ref 12.0–15.0)
Lymphocytes Relative: 17 %
Lymphs Abs: 1.7 10*3/uL (ref 0.7–4.0)
MCH: 26.5 pg (ref 26.0–34.0)
MCHC: 31.8 g/dL (ref 30.0–36.0)
MCV: 83.3 fL (ref 78.0–100.0)
MONOS PCT: 12 %
Monocytes Absolute: 1.2 10*3/uL — ABNORMAL HIGH (ref 0.1–1.0)
NEUTROS PCT: 71 %
Neutro Abs: 7 10*3/uL (ref 1.7–7.7)
Platelets: 136 10*3/uL — ABNORMAL LOW (ref 150–400)
RBC: 2.57 MIL/uL — AB (ref 3.87–5.11)
RDW: 13.7 % (ref 11.5–15.5)
WBC: 9.9 10*3/uL (ref 4.0–10.5)

## 2016-02-02 LAB — BASIC METABOLIC PANEL
ANION GAP: 9 (ref 5–15)
BUN: 15 mg/dL (ref 6–20)
CHLORIDE: 101 mmol/L (ref 101–111)
CO2: 27 mmol/L (ref 22–32)
Calcium: 8.8 mg/dL — ABNORMAL LOW (ref 8.9–10.3)
Creatinine, Ser: 1.18 mg/dL — ABNORMAL HIGH (ref 0.44–1.00)
GFR calc non Af Amer: 48 mL/min — ABNORMAL LOW (ref >60)
GFR, EST AFRICAN AMERICAN: 55 mL/min — AB (ref >60)
GLUCOSE: 208 mg/dL — AB (ref 65–99)
POTASSIUM: 3.5 mmol/L (ref 3.5–5.1)
Sodium: 137 mmol/L (ref 135–145)

## 2016-02-02 LAB — CBC
HEMATOCRIT: 22.3 % — AB (ref 36.0–46.0)
HEMOGLOBIN: 7.1 g/dL — AB (ref 12.0–15.0)
MCH: 26.7 pg (ref 26.0–34.0)
MCHC: 31.8 g/dL (ref 30.0–36.0)
MCV: 83.8 fL (ref 78.0–100.0)
Platelets: 151 10*3/uL (ref 150–400)
RBC: 2.66 MIL/uL — AB (ref 3.87–5.11)
RDW: 13.8 % (ref 11.5–15.5)
WBC: 6.7 10*3/uL (ref 4.0–10.5)

## 2016-02-02 LAB — TROPONIN I
Troponin I: <0.03 ng/mL (ref <0.03)
Troponin I: <0.03 ng/mL (ref <0.03)
Troponin I: 0.03 ng/mL (ref <0.03)

## 2016-02-02 LAB — HEMOGLOBIN A1C
Hgb A1c MFr Bld: 6 % — ABNORMAL HIGH (ref 4.8–5.6)
Mean Plasma Glucose: 126 mg/dL

## 2016-02-02 MED ORDER — FOLIC ACID 1 MG PO TABS
1.0000 mg | ORAL_TABLET | Freq: Every day | ORAL | Status: DC
  Administered 2016-02-02 – 2016-02-04 (×3): 1 mg via ORAL
  Filled 2016-02-02 (×3): qty 1

## 2016-02-02 MED ORDER — PIOGLITAZONE HCL 30 MG PO TABS
30.0000 mg | ORAL_TABLET | Freq: Every day | ORAL | Status: DC
  Administered 2016-02-02 – 2016-02-04 (×3): 30 mg via ORAL
  Filled 2016-02-02 (×3): qty 1

## 2016-02-02 NOTE — Progress Notes (Signed)
Physical Therapy Treatment Patient Details Name: [name] MRN: [id number] DOB: 03-29-1951 Today's Date: 02/02/2016    History of Present Illness patient is a 65 yo female, s/p R THA. (pmh includes previous left THA one year ago)    PT Comments    On arrival patient urgently needing to void.  Pt moves slow so PTA assisted patient with female urinal.  Pt then moved to edge of bed for treatment.  Pt remains slow and guarded with mobility.  Pt refused talk of possible rehab and understand she will need to negotiate stairs in the am.    Follow Up Recommendations  Home health PT;Supervision/Assistance - 24 hour     Equipment Recommendations  Other (comment)    Recommendations for Other Services       Precautions / Restrictions Precautions Precautions: Posterior Hip;Fall Precaution Booklet Issued: No Precaution Comments: educated in hip precautions related to ADL and during transfer Required Braces or Orthoses: Knee Immobilizer - Right Restrictions Weight Bearing Restrictions: Yes RLE Weight Bearing: Weight bearing as tolerated    Mobility  Bed Mobility Overal bed mobility: Needs Assistance Bed Mobility: Supine to Sit;Sit to Supine     Supine to sit: Mod assist Sit to supine: Mod assist   General bed mobility comments: Cues for sequencing Of LEs with maintenance of total hip precautions (posterior).  Pt extremely slow to ascend to seated position.    Transfers Overall transfer level: Needs assistance Equipment used: Rolling walker (2 wheeled) Transfers: Sit to/from Stand Sit to Stand: Min assist Stand pivot transfers: Min assist       General transfer comment: Pt required cues for sequencing and increased time to ascend from bed.    Ambulation/Gait Ambulation/Gait assistance: Mod assist Ambulation Distance (Feet): 28 Feet Assistive device: Rolling walker (2 wheeled) (youth height) Gait Pattern/deviations: Step-to pattern;Trunk flexed;Antalgic   Gait velocity  interpretation: <1.8 ft/sec, indicative of risk for recurrent falls General Gait Details: Pt performed improved posturing with lower youth height RW.  Pt remains to require cues for encouragement to advance gait and she fatigues quickly.     Stairs Stairs:  (will try in am.  )          Wheelchair Mobility    Modified Rankin (Stroke Patients Only)       Balance Overall balance assessment: Needs assistance   Sitting balance-Leahy Scale: Fair Sitting balance - Comments: able to sit on BSC self supported without difficulty     Standing balance-Leahy Scale: Poor Standing balance comment: unable to release walker in standing for pericare                    Cognition Arousal/Alertness: Awake/alert Behavior During Therapy: WFL for tasks assessed/performed Overall Cognitive Status: Within Functional Limits for tasks assessed                      Exercises Total Joint Exercises Ankle Circles/Pumps: AROM;Both;10 reps;Supine Quad Sets: AROM;Right;10 reps;Supine Heel Slides: AAROM;Right;10 reps;Supine Hip ABduction/ADduction: AAROM;Right;10 reps;Supine    General Comments        Pertinent Vitals/Pain Pain Assessment: 0-10 Pain Score: 6  Pain Location: R hip Pain Descriptors / Indicators: Operative site guarding;Grimacing Pain Intervention(s): Monitored during session;Repositioned    Home Living Family/patient expects to be discharged to:: Private residence Living Arrangements: Other relatives (sister) Available Help at Discharge: Family;Available 24 hours/day Type of Home: House Home Access: Stairs to enter Entrance Stairs-Rails: Right Home Layout: One level Home Equipment: Gilford Rile -  2 wheels;Bedside commode;Adaptive equipment Additional Comments: pt has AE and DME from previous hip replacement    Prior Function Level of Independence: Independent      Comments: intermittent use of cane   PT Goals (current goals can now be found in the care plan  section) Acute Rehab PT Goals Patient Stated Goal: to go home Potential to Achieve Goals: Good Progress towards PT goals: Progressing toward goals    Frequency  7X/week    PT Plan Current plan remains appropriate    Co-evaluation             End of Session Equipment Utilized During Treatment: Gait belt Activity Tolerance: Patient tolerated treatment well;Patient limited by fatigue;Patient limited by pain Patient left: with call bell/phone within reach;with family/visitor present;in bed     Time: [id number] PT Time Calculation (min) (ACUTE ONLY): 36 min  Charges:  $Gait Training: 8-22 mins $Therapeutic Activity: 8-22 mins                    G Codes:      Cristela Blue 2016/02/19, 3:42 PM  Governor Rooks, PTA pager [phone]

## 2016-02-02 NOTE — Progress Notes (Signed)
Patient ID: [name], female   DOB: 1951/05/26, 65 y.o.   MRN: [id number] PATIENT ID: [name]        MRN:  [id number]          DOB/AGE: Apr 19, 1951 / 65 y.o.    [name], MD   [name], PA-C [address] [place], [place]  [address]                             [phone]   PROGRESS NOTE  Subjective:  negative for Chest Pain  negative for Shortness of Breath  negative for Nausea/Vomiting   negative for Calf Pain    Tolerating Diet: yes         Patient reports pain as moderate and severe.     Had some pain in chest but relieved with antiacid  Objective: Vital signs in last 24 hours:   Patient Vitals for the past 24 hrs:  BP Temp Temp src Pulse Resp SpO2  02/02/16 0500 (!) 132/57 98 F (36.7 C) Oral 95 20 97 %  02/01/16 2100 (!) 139/45 98.7 F (37.1 C) Oral 86 18 97 %  02/01/16 1500 120/66 98.1 F (36.7 C) Oral 67 16 100 %  02/01/16 1415 (!) 138/92 - - (!) 59 (!) 9 100 %  02/01/16 1344 (!) 142/78 - - 60 10 100 %  02/01/16 1314 (!) 164/84 - - (!) 56 (!) 8 100 %  02/01/16 1244 (!) 146/86 - - 66 20 100 %  02/01/16 1229 139/78 - - (!) 56 12 100 %  02/01/16 1214 (!) 159/86 - - (!) 54 (!) 8 100 %  02/01/16 1159 (!) 168/79 - - 60 13 100 %  02/01/16 1144 (!) 153/78 - - (!) 57 11 100 %  02/01/16 1129 (!) 146/83 - - (!) 59 (!) 7 100 %  02/01/16 1114 (!) 146/105 - - 60 (!) 9 100 %  02/01/16 1059 (!) 168/86 - - 64 16 100 %  02/01/16 1048 - 98.6 F (37 C) - - - -  02/01/16 1044 (!) 169/93 - - 78 14 96 %      Intake/Output from previous day:   08/22 0701 - 08/23 0700 In: 2495 [P.O.:240; I.V.:2005] Out: 700    Intake/Output this shift:   No intake/output data recorded.   Intake/Output      08/22 0701 - 08/23 0700 08/23 0701 - 08/24 0700   P.O. 240    I.V. (mL/kg) 2005 (22)    IV Piggyback 250    Total Intake(mL/kg) 2495 (27.4)    Blood 700    Total Output 700     Net +1795             LABORATORY DATA:  Recent Labs  02/02/16 0517  WBC 6.7    HGB 7.1*  HCT 22.3*  PLT 151    Recent Labs  02/02/16 0517  NA 137  K 3.5  CL 101  CO2 27  BUN 15  CREATININE 1.18*  GLUCOSE 208*  CALCIUM 8.8*   Lab Results  Component Value Date   INR 1.02 01/21/2015   INR 1.03 12/25/2014    Recent Radiographic Studies :  Dg Hip Port Unilat With Pelvis 1v Right  Result Date: 02/01/2016 CLINICAL DATA:  Postop day 0 right total hip arthroplasty. EXAM: DG HIP (WITH OR WITHOUT PELVIS) 1V PORT RIGHT COMPARISON:  AP pelvis x-ray 02/02/2015 at the time  of left total hip arthroplasty. FINDINGS: Right total hip arthroplasty with anatomic alignment. No acute complicating features. The left total hip arthroplasty demonstrates anatomic alignment in the AP projection. Surgical mesh is noted overlying the upper pelvis. IMPRESSION: Right total hip arthroplasty with anatomic alignment no acute complicating features. Electronically Signed   By: Evangeline Dakin M.D.   On: 02/01/2016 12:25     Examination:  General appearance: alert, cooperative, moderate distress and moderately obese Resp: clear to auscultation bilaterally Cardio: regular rate and rhythm GI: normal findings: bowel sounds normal  Wound Exam: clean, dry, intact   Drainage:  Scant/small amount Bloody exudate  Motor Exam: EHL, FHL, Anterior Tibial and Posterior Tibial Intact  Sensory Exam: Superficial Peroneal, Deep Peroneal and Tibial normal  Vascular Exam: Right posterior tibial artery has trace pulse  Assessment:    1 Day Post-Op  Procedure(s) (LRB): TOTAL HIP ARTHROPLASTY (Right)  ADDITIONAL DIAGNOSIS:  Principal Problem:   Osteoarthritis of right hip Active Problems:   S/P total hip arthroplasty  Acute Blood Loss Anemia and Diabetes   Plan: Physical Therapy as ordered Weight Bearing as Tolerated (WBAT)  DVT Prophylaxis:  Xarelto, Foot Pumps and TED hose  DISCHARGE PLAN: Home  DISCHARGE NEEDS: HHPT, Walker and 3-in-1 comode seat   Will get EKG as a precaution.  Doubt cardiac for chest discomfort since relieved by Maalox.        [facility]  02/02/2016 7:54 AM

## 2016-02-02 NOTE — Progress Notes (Addendum)
CRITICAL VALUE ALERT  Critical value received:  Troponin = 0.03  Date of notification:  02/02/16  Time of notification:  2058  Critical value read back: yes  Nurse who received alert:  Kristopher Glee, RN  MD notified (1st page):  Dr. Erlinda Hong (on call for Dr. Durward Fortes)  Time of first page:  2104  MD notified (2nd page):  Time of second page:  Responding MD: Dr. Erlinda Hong  Time MD responded: 2109  Orders: Draw another troponin with am labs.

## 2016-02-02 NOTE — Evaluation (Signed)
Occupational Therapy Evaluation Patient Details Name: [name] MRN: [id number] DOB: 06/14/1950 Today's Date: 02/02/2016    History of Present Illness patient is a 65 yo female, s/p R THA. (pmh includes previous left THA one year ago)   Clinical Impression   Pt was independent in self care and using a cane intermittently prior to admission. Present with generalized weakness, post operative pain and poor balance interfering with ability to perform at her baseline. Pt moves extremely slowly, but only required min guard assist this visit. Pt with urinary incontinence. Will follow acutely.    Follow Up Recommendations  No OT follow up    Equipment Recommendations  None recommended by OT    Recommendations for Other Services       Precautions / Restrictions Precautions Precautions: Posterior Hip;Fall Precaution Comments: educated in hip precautions related to ADL and during transfer Required Braces or Orthoses: Knee Immobilizer - Right Restrictions Weight Bearing Restrictions: Yes RLE Weight Bearing: Weight bearing as tolerated      Mobility Bed Mobility               General bed mobility comments: in chair  Transfers Overall transfer level: Needs assistance Equipment used: Rolling walker (2 wheeled) Transfers: Sit to/from [employer] Sit to Stand: Min guard Stand pivot transfers: Min guard       General transfer comment: increased time, heavy reliance on UEs from chair and 3 in 1    Balance     Sitting balance-Leahy Scale: Fair       Standing balance-Leahy Scale: Poor Standing balance comment: unable to release walker in standing for pericare                            ADL Overall ADL's : Needs assistance/impaired Eating/Feeding: Independent;Sitting   Grooming: Wash/dry hands;Wash/dry face;Sitting;Set up   Upper Body Bathing: Set up;Sitting   Lower Body Bathing: Maximal assistance;Sit to/from stand Lower Body  Bathing Details (indicate cue type and reason): pt has a long bath sponge at home Upper Body Dressing : Minimal assistance;Sitting Upper Body Dressing Details (indicate cue type and reason): due to IV line Lower Body Dressing: Maximal assistance;Sit to/from stand Lower Body Dressing Details (indicate cue type and reason): pt is knowledgeable in use of reacher, sock aid and long handled bath sponge, educated in safe footwear Toilet Transfer: Min guard;Stand-pivot;BSC;RW   Toileting- Clothing Manipulation and Hygiene: Maximal assistance;Sit to/from stand         General ADL Comments: pt with urinary incontinence during transfer from recliner to 3 in 1. Pt moves extremely slowly. Pt plans to sponge bathe.     Vision     Perception     Praxis      Pertinent Vitals/Pain Pain Assessment: 0-10 Pain Score: 4  Pain Location: R hip Pain Descriptors / Indicators: Operative site guarding;Grimacing Pain Intervention(s): Monitored during session;Repositioned;Premedicated before session;Ice applied     Hand Dominance Right   Extremity/Trunk Assessment Upper Extremity Assessment Upper Extremity Assessment: Generalized weakness;Overall [facility] for tasks assessed   Lower Extremity Assessment Lower Extremity Assessment: Defer to PT evaluation   Cervical / Trunk Assessment Cervical / Trunk Assessment:  (flexed posture in standing)   Communication Communication Communication: No difficulties   Cognition Arousal/Alertness: Awake/alert Behavior During Therapy: WFL for tasks assessed/performed Overall Cognitive Status: Within Functional Limits for tasks assessed  General Comments       Exercises       Shoulder Instructions      Home Living Family/patient expects to be discharged to:: Private residence Living Arrangements: Other relatives (sister) Available Help at Discharge: Family;Available 24 hours/day Type of Home: House Home Access: Stairs to  enter [employer] of Steps: 5 Entrance Stairs-Rails: Right Home Layout: One level     Bathroom Shower/Tub: Teacher, early years/pre: Standard     Home Equipment: Environmental consultant - 2 wheels;Bedside commode;Adaptive equipment Adaptive Equipment: Sock aid;Reacher;Long-handled shoe horn;Long-handled sponge Additional Comments: pt has AE and DME from previous hip replacement      Prior Functioning/Environment Level of Independence: Independent        Comments: intermittent use of cane    OT Diagnosis: Generalized weakness;Acute pain   OT Problem List: Decreased strength;Decreased activity tolerance;Impaired balance (sitting and/or standing);Decreased knowledge of use of DME or AE;Decreased knowledge of precautions;Obesity;Pain   OT Treatment/Interventions: Self-care/ADL training;Therapeutic activities;Patient/family education;Balance training;DME and/or AE instruction    OT Goals(Current goals can be found in the care plan section) Acute Rehab OT Goals Patient Stated Goal: to go home OT Goal Formulation: With patient Time For Goal Achievement: 02/09/16 Potential to Achieve Goals: Good ADL Goals Pt Will Perform Grooming: with supervision;standing Pt Will Transfer to Toilet: with supervision;ambulating (3 in 1 over toilet) Pt Will Perform Toileting - Clothing Manipulation and hygiene: with supervision;sit to/from stand Additional ADL Goal #1: Pt will state 3/3 posterior hip precautions.  OT Frequency: Min 2X/week   Barriers to D/C:            Co-evaluation              End of Session    Activity Tolerance: Patient tolerated treatment well Patient left: in chair;with call bell/phone within reach;with chair alarm set;with family/visitor present   Time: [id number] OT Time Calculation (min): 27 min Charges:  OT General Charges $OT Visit: 1 Procedure OT Evaluation $OT Eval Moderate Complexity: 1 Procedure OT Treatments $Self Care/Home Management : 8-22  mins G-Codes:    Malka So 02/02/2016, 12:50 PM [phone]

## 2016-02-02 NOTE — Progress Notes (Signed)
Physical Therapy Treatment Patient Details Name: [name] MRN: [id number] DOB: 06-22-1950 Today's Date: 02/02/2016    History of Present Illness patient is a 65 yo female, s/p R THA. (pmh includisve of previous left THA)    PT Comments    Pt progressed to gait training with max VCs for safety and sequencing.  Will f/u in pm to continue progression with youth height RW.  RW in room is too tall.    Follow Up Recommendations  Home health PT;Supervision/Assistance - 24 hour     Equipment Recommendations  Other (comment)    Recommendations for Other Services       Precautions / Restrictions Precautions Precautions: Posterior Hip Precaution Booklet Issued: No Precaution Comments: Pt able to recall 1/3 hip precautions and required re-education and constant cueing during tx to maintain.   Restrictions Weight Bearing Restrictions: Yes RLE Weight Bearing: Weight bearing as tolerated    Mobility  Bed Mobility Overal bed mobility: Needs Assistance Bed Mobility: Supine to Sit     Supine to sit: Max assist     General bed mobility comments: Pt required increased assist with LEs to advance to edge of bed.  Pt able to elevate trunk into sitting with use of rail.  PTA used chux pad to advance patient to edge of bed.    Transfers Overall transfer level: Needs assistance Equipment used: Rolling walker (2 wheeled) (would benefit from youth height RW.  ) Transfers: Sit to/from Stand Sit to Stand: Mod assist Stand pivot transfers: Mod assist       General transfer comment: VCs for hand placement, hip extension, head control and forward gaze.  Pt required cues to maintain safety and improve stance.    Ambulation/Gait Ambulation/Gait assistance: Mod assist;+2 safety/equipment (for chair follow.  ) Ambulation Distance (Feet): 16 Feet Assistive device: Rolling walker (2 wheeled) (needs youth height RW.  ) Gait Pattern/deviations: Step-to pattern;Trunk flexed;Antalgic;Shuffle      General Gait Details: Cues for weight shifting R, L foot clearance, upper trunk control and head control.  pt fatigues easily and begins to collapse on RW with upper trunk.  Pt would benefit from youth Height RW to improve use of UEs during gait sequencing,.  Required close chair follow.     Stairs            Wheelchair Mobility    Modified Rankin (Stroke Patients Only)       Balance Overall balance assessment: Needs assistance   Sitting balance-Leahy Scale: Fair       Standing balance-Leahy Scale: Poor Standing balance comment: Reliance on RW for support in flexed position.                      Cognition Arousal/Alertness: Awake/alert;Lethargic Behavior During Therapy: WFL for tasks assessed/performed Overall Cognitive Status: Within Functional Limits for tasks assessed                      Exercises Total Joint Exercises Ankle Circles/Pumps: AROM;Both;10 reps;Supine Quad Sets: AROM;Right;10 reps;Supine Heel Slides: AAROM;Right;10 reps;Supine Hip ABduction/ADduction: AAROM;Right;10 reps;Supine    General Comments        Pertinent Vitals/Pain Pain Assessment: 0-10 Pain Score: 4  Pain Location: R hip  Pain Descriptors / Indicators: Sore;Guarding;Grimacing Pain Intervention(s): Limited activity within patient's tolerance;Repositioned    Home Living                      Prior Function  PT Goals (current goals can now be found in the care plan section) Acute Rehab PT Goals Patient Stated Goal: to go home Potential to Achieve Goals: Good Progress towards PT goals: Progressing toward goals    Frequency  7X/week    PT Plan Current plan remains appropriate    Co-evaluation             End of Session Equipment Utilized During Treatment: Gait belt Activity Tolerance: Patient tolerated treatment well;Patient limited by fatigue;Patient limited by pain Patient left: in chair;with call bell/phone within reach;with  family/visitor present     Time: [id number] PT Time Calculation (min) (ACUTE ONLY): 32 min  Charges:  $Gait Training: 8-22 mins $Therapeutic Activity: 8-22 mins                    G Codes:      Cristela Blue 2016-02-14, 8:34 AM Governor Rooks, PTA pager [phone]

## 2016-02-02 NOTE — Progress Notes (Signed)
Aaron Edelman, [place] notified of HGB 6.8. Vital signs obtained and recorded. NO new orders rec'd at this time.

## 2016-02-02 NOTE — Discharge Instructions (Signed)
Information on my medicine - XARELTO (Rivaroxaban)  This medication education was reviewed with me or my healthcare representative as part of my discharge preparation.  The pharmacist that spoke with me during my hospital stay was:  Georgina Peer, [facility]  Why was Xarelto prescribed for you? Xarelto was prescribed for you to reduce the risk of blood clots forming after orthopedic surgery. The medical term for these abnormal blood clots is venous thromboembolism (VTE).  What do you need to know about xarelto ? Take your Xarelto ONCE DAILY at the same time every day. You may take it either with or without food.  If you have difficulty swallowing the tablet whole, you may crush it and mix in applesauce just prior to taking your dose.  Take Xarelto exactly as prescribed by your doctor and DO NOT stop taking Xarelto without talking to the doctor who prescribed the medication.  Stopping without other VTE prevention medication to take the place of Xarelto may increase your risk of developing a clot.  After discharge, you should have regular check-up appointments with your healthcare provider that is prescribing your Xarelto.    What do you do if you miss a dose? If you miss a dose, take it as soon as you remember on the same day then continue your regularly scheduled once daily regimen the next day. Do not take two doses of Xarelto on the same day.   Important Safety Information A possible side effect of Xarelto is bleeding. You should call your healthcare provider right away if you experience any of the following: ? Bleeding from an injury or your nose that does not stop. ? Unusual colored urine (red or dark brown) or unusual colored stools (red or black). ? Unusual bruising for unknown reasons. ? A serious fall or if you hit your head (even if there is no bleeding).  Some medicines may interact with Xarelto and might increase your risk of bleeding while on Xarelto. To help avoid  this, consult your healthcare provider or pharmacist prior to using any new prescription or non-prescription medications, including herbals, vitamins, non-steroidal anti-inflammatory drugs (NSAIDs) and supplements.  This website has more information on Xarelto: [email or web address]

## 2016-02-02 NOTE — Op Note (Signed)
NAMEALISHEA, BEAUDIN NO.:  [id number]  MEDICAL RECORD NO.:  [id number]  LOCATION:                                 FACILITY:  PHYSICIAN:  Vonna Kotyk. Siniya Lichty, M.D.DATE OF BIRTH:  December 19, 1950  DATE OF PROCEDURE:  02/01/2016 DATE OF DISCHARGE:                              OPERATIVE REPORT   PREOPERATIVE DIAGNOSIS:  End-stage osteoarthritis, right hip.  POSTOPERATIVE DIAGNOSIS:  End-stage osteoarthritis, right hip.  PROCEDURE:  Right total hip replacement.  SURGEON:  Vonna Kotyk. Durward Fortes, M.D.  ASSISTANT:  Aaron Edelman D. Petrarca, P.A.-C.  ANESTHESIA:  General.  COMPLICATIONS:  None.  COMPONENTS:  DePuy AML small stature 15-mm femoral component.  A 36-mm outer diameter hip ball with a +1.5-mm neck length.  A 52-mm outer diameter Gription 3 acetabular shell with a single 6.5-mm acetabular screw, 25 mm in length.  Apex hole eliminator and a Marathon polyethylene liner +4 with a 10-degree posterior lip.  DESCRIPTION OF PROCEDURE:  Ms. Dimaano was met in the holding area.  I identified the right hip as the appropriate operative site and marked it accordingly.  She was transported to room #7 and placed under general anesthesia per Anesthesia.  She was then placed in the lateral decubitus position with the right side up and secured to the operating table with the Innomed hip system. The right lower extremity was then prepped with chlorhexidine scrub and DuraPrep x2 from the iliac crest to the ankle and sterile draping was performed.  Time-out was called.  A routine Southern incision was utilized and via sharp dissection, carried down to subcutaneous tissue. The patient had abundant adipose tissue and a very large leg which actually made the procedure quite difficult.  There was about 4-6 inches of adipose tissue before encountering a very weak gluteus fascia.  This was incised manually separating the muscle fibers.  There was a large amount of adipose tissue beneath the  gluteal muscles and the IT band.  I was able to palpate the greater trochanter and insert the retractors. The short external rotators were eventually found.  Any tendinous structures were tagged with 0 Ethibond suture and then incised.  Capsule was incised along the femoral neck and head.  There was a very small clear yellow joint effusion.  The head was then dislocated posteriorly. Using the calcar guide and an oscillating saw, the femoral head was osteotomized from the femoral neck.  Retractors again placed along the proximal femur.  Starter hole was then made, followed by the canal finding.  Rasping was performed by hand to 15 mm.  I thought I had a very nice fit.  The level of my femoral neck was about a fingerbreadth proximal to the lesser trochanter.  Retractors were then placed around the acetabulum.  Again, I had difficulty with visualization because of the size of the patient and the depth of the wound.  The labrum was excised.  Reaming was performed sequentially to 51 mm to accept a 52 component.  I trialed a 50-mm component which would completely seat, the 52 would not.  The final Gription 3 femoral acetabular shell was then impacted into the acetabulum.  I had a  very nice fit and very nice alignment using external guide.  The trial polyethylene liner was inserted followed by the small stature 15-mm stem.  We trialed several neck lengths on the 32- mm hip ball and felt the +1 was perfectly stable and allowed symmetrical leg lengths.  The trial components were removed.  I irrigated the acetabulum.  I inserted a single 6.5 mm x 25 mm acetabular screw after appropriate drilling and measuring.  I inserted the apex hole eliminator, and a final Marathon polyethylene component until it was quite stable.  We then irrigated the femur.  The 15-mm small stature femoral component was impacted.  It would not rotate but it felt like it was a little loose putting it in.  So then I  impacted the proximal femur with bone graft.  At that point, I thought it was nice and tight.  We cleaned the acetabulum and irrigated it and then applied the final metallic 36-mm outer diameter hip ball with a +1.5-mm neck length.  This was reduced and through a full range of motion we could not dislocate the hip.  The leg lengths appeared to be symmetrical.  I did palpate the sciatic nerve throughout the procedure and felt this was well out of harm's way.  We applied tranexamic acid to the deeper wound and then infiltrated the capsule with 0.25% Marcaine with epinephrine.  The capsule was closed with 0 Ethibond suture.  The short external rotators closed with a similar material.  Gluteus fascia and IT band were closed with a running #1 Vicryl suture.  The wounds were then subsequently closed with Vicryl and 3-0 Monocryl, skin closed with skin clips.  We applied tranexamic acid to the more superficial wound and thought that it was nice and dry.  A sterile bulky dressing was applied.  The patient was then placed supine and a knee immobilizer placed to the right lower extremity.  The patient was awoken, placed on the operating stretcher and returned to the postanesthesia recovery room without problems.     Peter W. Whitfield, M.D.   ______________________________ Peter W. Whitfield, M.D.    PWW/MEDQ  D:  02/01/2016  T:  02/01/2016  Job:  [id number] 

## 2016-02-03 ENCOUNTER — Other Ambulatory Visit: Payer: Self-pay

## 2016-02-03 DIAGNOSIS — R072 Precordial pain: Secondary | ICD-10-CM

## 2016-02-03 LAB — CBC
HEMATOCRIT: 18.6 % — AB (ref 36.0–46.0)
HEMATOCRIT: 19.6 % — AB (ref 36.0–46.0)
HEMOGLOBIN: 5.9 g/dL — AB (ref 12.0–15.0)
HEMOGLOBIN: 6.2 g/dL — AB (ref 12.0–15.0)
MCH: 26.1 pg (ref 26.0–34.0)
MCH: 26.4 pg (ref 26.0–34.0)
MCHC: 31.7 g/dL (ref 30.0–36.0)
MCHC: 31.6 g/dL (ref 30.0–36.0)
MCV: 82.3 fL (ref 78.0–100.0)
MCV: 83.4 fL (ref 78.0–100.0)
Platelets: 128 10*3/uL — ABNORMAL LOW (ref 150–400)
Platelets: 143 10*3/uL — ABNORMAL LOW (ref 150–400)
RBC: 2.26 MIL/uL — ABNORMAL LOW (ref 3.87–5.11)
RBC: 2.35 MIL/uL — AB (ref 3.87–5.11)
RDW: 13.8 % (ref 11.5–15.5)
RDW: 13.9 % (ref 11.5–15.5)
WBC: 9.8 10*3/uL (ref 4.0–10.5)
WBC: 10.3 10*3/uL (ref 4.0–10.5)

## 2016-02-03 LAB — GLUCOSE, CAPILLARY
GLUCOSE-CAPILLARY: 154 mg/dL — AB (ref 65–99)
GLUCOSE-CAPILLARY: 136 mg/dL — AB (ref 65–99)
Glucose-Capillary: 145 mg/dL — ABNORMAL HIGH (ref 65–99)
Glucose-Capillary: 141 mg/dL — ABNORMAL HIGH (ref 65–99)

## 2016-02-03 LAB — BASIC METABOLIC PANEL
ANION GAP: 5 (ref 5–15)
BUN: 12 mg/dL (ref 6–20)
CHLORIDE: 105 mmol/L (ref 101–111)
CO2: 28 mmol/L (ref 22–32)
Calcium: 8.6 mg/dL — ABNORMAL LOW (ref 8.9–10.3)
Creatinine, Ser: 0.95 mg/dL (ref 0.44–1.00)
GFR calc non Af Amer: >60 mL/min (ref >60)
GLUCOSE: 158 mg/dL — AB (ref 65–99)
Potassium: 3.5 mmol/L (ref 3.5–5.1)
Sodium: 138 mmol/L (ref 135–145)

## 2016-02-03 LAB — TROPONIN I: Troponin I: 0.05 ng/mL (ref <0.03)

## 2016-02-03 LAB — PREPARE RBC (CROSSMATCH)

## 2016-02-03 MED ORDER — HYDROCODONE-ACETAMINOPHEN 5-325 MG PO TABS: 1.0000 | ORAL_TABLET | ORAL | 0 refills | Status: DC | PRN

## 2016-02-03 MED ORDER — METHOCARBAMOL 500 MG PO TABS: 500.0000 mg | ORAL_TABLET | Freq: Three times a day (TID) | ORAL | 0 refills | Status: DC | PRN

## 2016-02-03 MED ORDER — SODIUM CHLORIDE 0.9% FLUSH: 3.0000 mL | INTRAVENOUS | Status: DC | PRN

## 2016-02-03 MED ORDER — RIVAROXABAN 10 MG PO TABS: 10.0000 mg | ORAL_TABLET | Freq: Every day | ORAL | 0 refills | Status: DC

## 2016-02-03 MED ORDER — SODIUM CHLORIDE 0.9% FLUSH
3.0000 mL | Freq: Two times a day (BID) | INTRAVENOUS | Status: DC
  Administered 2016-02-03: 3 mL via INTRAVENOUS

## 2016-02-03 MED ORDER — SODIUM CHLORIDE 0.9 % IV SOLN: 250.0000 mL | INTRAVENOUS | Status: DC | PRN

## 2016-02-03 MED ORDER — SODIUM CHLORIDE 0.9 % IV SOLN
Freq: Once | INTRAVENOUS | Status: AC
  Administered 2016-02-03: 13:00:00 via INTRAVENOUS

## 2016-02-03 NOTE — Progress Notes (Signed)
Physical Therapy Treatment Patient Details Name: [name] MRN: [id number] DOB: 1950/08/14 Today's Date: 02/03/2016    History of Present Illness patient is a 65 yo female, s/p R THA. (pmh includes previous left THA one year ago)    PT Comments    Pt remains lethargic due to low HGb.  Pt awaiting blood transfusion at this time.  Pt reviewed supine, seated and standing exercises in prep for d/c home.  Pt will remain over night to allow for blood transfusion.  PTA will f/u in am to reassess for safety at this time.  HEP issued after session with Hip Precautions and stair training hand out attached.  Sister remains supportive at this time.     Follow Up Recommendations  Home health PT;Supervision/Assistance - 24 hour     Equipment Recommendations  Other (comment)    Recommendations for Other Services       Precautions / Restrictions Precautions Precautions: Posterior Hip;Fall Precaution Booklet Issued: No Precaution Comments: cued to maintain hip precautions during pericare and bathing Required Braces or Orthoses: Knee Immobilizer - Right Restrictions Weight Bearing Restrictions: Yes RLE Weight Bearing: Weight bearing as tolerated    Mobility  Bed Mobility               General bed mobility comments: Pt receieved in recliner on arrival, sleeping.    Transfers Overall transfer level: Needs assistance Equipment used: Rolling walker (2 wheeled) Transfers: Sit to/from Stand   Stand pivot transfers: Supervision       General transfer comment: cues for upright posture, increased time, good hand placement  Ambulation/Gait                 Stairs            Wheelchair Mobility    Modified Rankin (Stroke Patients Only)       Balance     Sitting balance-Leahy Scale: Good       Standing balance-Leahy Scale: Fair                      Cognition Arousal/Alertness: Awake/alert Behavior During Therapy: WFL for tasks  assessed/performed Overall Cognitive Status: Within Functional Limits for tasks assessed                      Exercises Total Joint Exercises Ankle Circles/Pumps: AROM;10 reps;Supine;Both Quad Sets: AROM;Right;10 reps;Supine Short Arc Quad: AROM;Right;10 reps;Supine Heel Slides: AROM;Right;10 reps;Supine Hip ABduction/ADduction: AROM;Right;20 reps;Standing;Supine Long Arc Quad: AROM;Right;10 reps;Seated Knee Flexion: AROM;Right;10 reps;Standing Marching in Standing: AROM;Right;10 reps;Standing Standing Hip Extension: AROM;Right;10 reps;Standing    General Comments        Pertinent Vitals/Pain Pain Assessment: 0-10 Pain Score: 2  Pain Location: R hip Pain Descriptors / Indicators: Sore;Guarding Pain Intervention(s): Repositioned;Ice applied;Other (comment)    Home Living                      Prior Function            PT Goals (current goals can now be found in the care plan section) Acute Rehab PT Goals Patient Stated Goal: to go home Potential to Achieve Goals: Good Progress towards PT goals: Progressing toward goals    Frequency  7X/week    PT Plan Current plan remains appropriate    Co-evaluation             End of Session Equipment Utilized During Treatment: Gait belt Activity Tolerance: Patient tolerated treatment well;Patient  limited by fatigue;Patient limited by pain (low HGB 6.2) Patient left: in chair;with family/visitor present;with call bell/phone within reach     Time: 1421-1458 PT Time Calculation (min) (ACUTE ONLY): 37 min  Charges:  $Therapeutic Exercise: 23-37 mins                    G Codes:      Cristela Blue 03/01/16, 3:18 PM  Governor Rooks, PTA pager [phone]

## 2016-02-03 NOTE — Progress Notes (Signed)
Patient ID: [name], female   DOB: 1951-04-29, 65 y.o.   MRN: [id number] PATIENT ID: [name]        MRN:  [id number]          DOB/AGE: 1950-11-30 / 65 y.o.    Angela Fears, MD   [name], PA-C [address] [place], [place]  [address]                             [phone]   PROGRESS NOTE  Subjective:  negative for Chest Pain  negative for Shortness of Breath  negative for Nausea/Vomiting   negative for Calf Pain    Tolerating Diet: yes         Patient reports pain as mild and moderate.     Complaints of urinary incontinence  States pain is better  Objective: Vital signs in last 24 hours:   Patient Vitals for the past 24 hrs:  BP Temp Temp src Pulse Resp SpO2  02/03/16 0520 (!) 137/56 99 F (37.2 C) Oral 90 18 98 %  02/02/16 2232 - 99.9 F (37.7 C) Oral - - -  02/02/16 2000 (!) 165/70 - - (!) 105 20 98 %  02/02/16 1500 (!) 147/62 99.5 F (37.5 C) Oral 89 20 100 %      Intake/Output from previous day:   08/23 0701 - 08/24 0700 In: 865 [P.O.:480; I.V.:385] Out: -    Intake/Output this shift:   No intake/output data recorded.   Intake/Output      08/23 0701 - 08/24 0700 08/24 0701 - 08/25 0700   P.O. 480    I.V. (mL/kg) 385 (4.2)    IV Piggyback     Total Intake(mL/kg) 865 (9.5)    Blood     Total Output       Net +865          Urine Occurrence 4 x       LABORATORY DATA:  Recent Labs  02/02/16 0517 02/02/16 1437 02/03/16 0539  WBC 6.7 9.9 9.8  HGB 7.1* 6.8* 5.9*  HCT 22.3* 21.4* 18.6*  PLT 151 136* 128*    Recent Labs  02/02/16 0517 02/03/16 0539  NA 137 138  K 3.5 3.5  CL 101 105  CO2 27 28  BUN 15 12  CREATININE 1.18* 0.95  GLUCOSE 208* 158*  CALCIUM 8.8* 8.6*   Lab Results  Component Value Date   INR 1.02 01/21/2015   INR 1.03 12/25/2014    Recent Radiographic Studies :  Dg Hip Port Unilat With Pelvis 1v Right  Result Date: 02/01/2016 CLINICAL DATA:  Postop day 0 right total hip arthroplasty. EXAM: DG HIP  (WITH OR WITHOUT PELVIS) 1V PORT RIGHT COMPARISON:  AP pelvis x-ray 02/02/2015 at the time of left total hip arthroplasty. FINDINGS: Right total hip arthroplasty with anatomic alignment. No acute complicating features. The left total hip arthroplasty demonstrates anatomic alignment in the AP projection. Surgical mesh is noted overlying the upper pelvis. IMPRESSION: Right total hip arthroplasty with anatomic alignment no acute complicating features. Electronically Signed   By: Evangeline Dakin M.D.   On: 02/01/2016 12:25     Examination:  General appearance: alert, cooperative, mild distress and moderate distress Resp: clear to auscultation bilaterally Cardio: regular rate and rhythm GI: normal findings: bowel sounds normal  Wound Exam: clean, dry, intact   Drainage:  Scant/small amount Bloody exudate  Motor Exam: EHL, FHL, Anterior  Tibial and Posterior Tibial Intact  Sensory Exam: Superficial Peroneal, Deep Peroneal and Tibial normal  Vascular Exam: Right posterior tibial artery has trace pulse  Assessment:    2 Days Post-Op  Procedure(s) (LRB): TOTAL HIP ARTHROPLASTY (Right)  ADDITIONAL DIAGNOSIS:  Principal Problem:   Osteoarthritis of right hip Active Problems:   S/P total hip arthroplasty  Acute Blood Loss Anemia and Diabetes  Elevated Troponin   Plan: Physical Therapy as ordered Weight Bearing as Tolerated (WBAT)  DVT Prophylaxis:  Xarelto, Foot Pumps and TED hose  DISCHARGE PLAN: Home  DISCHARGE NEEDS: HHPT, Walker and 3-in-1 comode seat   Cardiology Consult - Spoke to Cardiology and they will see her for elevated troponin  Repeat CBC.  If same then may consider giving PRBC's for cardiac protection          [facility]  02/03/2016 8:12 AM

## 2016-02-03 NOTE — Progress Notes (Signed)
Physical Therapy Treatment Patient Details Name: [name] MRN: [id number] DOB: 11/26/1950 Today's Date: 02/03/2016    History of Present Illness patient is a 65 yo female, s/p R THA. (pmh includes previous left THA one year ago)    PT Comments    Pt performed increased gait and reviewed stair training with cane in prep for d/c home.  Pt will require assist at home with all mobility.  Pt remains to present with poor safety awareness.  Pt adamant to return home at this time and uninterested in exploring other rehab options.  Recommendations remain HHPT.    Follow Up Recommendations  Home health PT;Supervision/Assistance - 24 hour     Equipment Recommendations  Other (comment)    Recommendations for Other Services       Precautions / Restrictions Precautions Precautions: Posterior Hip;Fall Precaution Booklet Issued: No Precaution Comments: educated in hip precautions related to mobility.  Pt required cues for maintaining hip precautions.  Particularly avoid IR.   Required Braces or Orthoses: Knee Immobilizer - Right Restrictions Weight Bearing Restrictions: Yes RLE Weight Bearing: Weight bearing as tolerated    Mobility  Bed Mobility Overal bed mobility: Needs Assistance Bed Mobility: Supine to Sit     Supine to sit: Min assist     General bed mobility comments: Pt required increased time to advance to edge of bed with min assist for LEs to move in the right direction.    Transfers Overall transfer level: Needs assistance Equipment used: Rolling walker (2 wheeled) Transfers: Sit to/from Stand Sit to Stand: Min guard Stand pivot transfers: Min guard       General transfer comment: Pt required cues for sequencing and body position to avoid flexing greater than 90 degrees at the hip.    Ambulation/Gait Ambulation/Gait assistance: Min guard Ambulation Distance (Feet): 36 Feet Assistive device: Rolling walker (2 wheeled) Gait Pattern/deviations: Step-to  pattern   Gait velocity interpretation: <1.8 ft/sec, indicative of risk for recurrent falls General Gait Details: Pt remains to require cues for upper trunk control, hip extension, RW proximity, and sequencing.  Pt will require physical assistance at d/c as she remains unsafe with poor carryover of safety.  PTA educated sister on need for assistance with all mobility.     Stairs Stairs: Yes Stairs assistance: Min assist Stair Management: One rail Left;With cane;Forwards Number of Stairs: 4 General stair comments: Cues for sequencing, cane placement, body position and maintaining hip precautions.  Required increased time.    Wheelchair Mobility    Modified Rankin (Stroke Patients Only)       Balance Overall balance assessment: Needs assistance   Sitting balance-Leahy Scale: Fair       Standing balance-Leahy Scale: Poor                      Cognition Arousal/Alertness: Awake/alert Behavior During Therapy: WFL for tasks assessed/performed Overall Cognitive Status: Within Functional Limits for tasks assessed                      Exercises      General Comments        Pertinent Vitals/Pain Pain Assessment: 0-10 Pain Score: 4  Pain Location: R hip with movement Pain Descriptors / Indicators: Grimacing;Guarding;Operative site guarding Pain Intervention(s): Monitored during session    Home Living                      Prior Function  PT Goals (current goals can now be found in the care plan section) Acute Rehab PT Goals Patient Stated Goal: to go home Potential to Achieve Goals: Good Progress towards PT goals: Progressing toward goals    Frequency  7X/week    PT Plan Current plan remains appropriate    Co-evaluation             End of Session Equipment Utilized During Treatment: Gait belt Activity Tolerance: Patient tolerated treatment well;Patient limited by fatigue;Patient limited by pain Patient left: in chair  (with OT entering to perform bathroom session.  )     Time: [id number] PT Time Calculation (min) (ACUTE ONLY): 49 min  Charges:  $Gait Training: 23-37 mins $Therapeutic Activity: 8-22 mins                    G Codes:      Cristela Blue February 07, 2016, 10:04 AM  Governor Rooks, PTA pager [phone]

## 2016-02-03 NOTE — Discharge Summary (Signed)
Angela Fears, MD   [name], PA-C [address], [place], [place]  [address]                             [phone]  PATIENT ID: [name]        MRN:  [id number]          DOB/AGE: 1951-04-07 / 65 y.o.    DISCHARGE SUMMARY  ADMISSION DATE:    02/01/2016 DISCHARGE DATE:   02/04/2016   ADMISSION DIAGNOSIS: RIGHT HIP OSTEOARTHRITIS    DISCHARGE DIAGNOSIS:  RIGHT HIP OSTEOARTHRITIS    ADDITIONAL DIAGNOSIS: Principal Problem:   Osteoarthritis of right hip Active Problems:   S/P total hip arthroplasty  Past Medical History:  Diagnosis Date  . Allergic rhinitis   . Anemia   . Ankle swelling   . Anxiety    takes Xanax daily   . Arthritis   . Asthma   . Blurred vision, bilateral   . Chronic kidney disease   . Depression    takes Celexa daily  . Diabetes mellitus without complication ([place])    type II; takes Actos daily  . GERD (gastroesophageal reflux disease)   . Gout   . Heart murmur   . Heart palpitations   . Hemorrhoids   . History of blood transfusion    no abnormal reaction noted  . History of bronchitis   . Hyperlipidemia   . Hypertension    takes Catapres and Quinapril daily  . Hypothyroidism    takes Synthroid daily  . Moderate mitral insufficiency    a. 12/2014 Echo: EF 55-60%, mod MR, mildly to moderately dil LA, PASP 87mmHg.  . Osteoarthritis   . Paroxysmal supraventricular tachycardia ([place])   . Ringing in ears   . Shortness of breath dyspnea    ALbuterol daily as needed  . Stevens-Johnson disease ([place])   . Urinary incontinence     PROCEDURE: Procedure(s): TOTAL HIP ARTHROPLASTY Right on 02/01/2016  CONSULTS: Cardiology    HISTORY: [name] is a very pleasant 65 year old African American female who is seen today for evaluation of her right hip and groin pain. She has had her left total hip arthroplasty performed in August 2016 and had done extremely well with that. She had been noted to have avascular necrosis and osteoarthritis  of the right hip at the time of her surgery on her left. She has gotten now to the point where it has become progressive to where she is using a cane andhas apparently been given narcotics by Dr. Carmie End office, and according to the paperwork she was given hydrocodone 1 to 2 tablets of 10/325 mg 1 to 2 every 6 hours as needed for pain. She is now worsening and is having difficulty with sleeping at night as well as activities of daily living. Her pain is now moderate to severe and can go as high as 8 or 9 without the pain medicine. She walks with an antalgic limp, and it has gotten to the point now where she cannot maneuver well enough to enjoy her life. She has now gotten to the point where radiographically she has had progressive collapse of her right femoral head, and it is completely involved with significant osteoarthritic changes  HOSPITAL COURSE:  [name] is a 65 y.o. admitted on 02/01/2016 and found to have a diagnosis of [place].  After appropriate laboratory studies were obtained  they were taken  to the operating room on 02/01/2016 and underwent  Procedure(s): TOTAL HIP ARTHROPLASTY  Right.   They were given perioperative antibiotics:  Anti-infectives    Start     Dose/Rate Route Frequency Ordered Stop   02/01/16 1600  ceFAZolin (ANCEF) IVPB 2g/100 mL premix     2 g 200 mL/hr over 30 Minutes Intravenous Every 6 hours 02/01/16 1118 02/02/16 1159   02/01/16 0713  ceFAZolin (ANCEF) 2-4 GM/100ML-% IVPB    Comments:  Elige Ko   : cabinet override      02/01/16 0713 02/01/16 1929    .  Tolerated the procedure well.  Toradol was given post op.  POD #1, allowed out of bed to a chair.  PT for ambulation and exercise program.  O2 discontionued.  Noted that she had some mid epigastric pain and was given antacid which helped.  Concern for Cardiac issue, EKG (no real change from last) and troponin's were ordered.  Initial values less than 0.03.  Series did however  reveal elevation to 0.05. Hgb 7.1 with repeat of 6.8 but stable VS.    POD #2, continued PT and ambulation.  Because of elevation, Cardiology consult was requested. Hgb 5.9. Repeated CBC and a repeat EKG was ordered.  Gave 2 units PRBC's.  Cardiology felt pain atypical and Troponin's not elevated enough for concern.  POD #3, continued PT.    The remainder of the hospital course was dedicated to ambulation and strengthening.   The patient was discharged on 3 Days Post-Op in  Stable condition.  Blood products given:2 units PRBC  DIAGNOSTIC STUDIES: Recent vital signs: Patient Vitals for the past 24 hrs:  BP Temp Temp src Pulse Resp SpO2  02/03/16 0520 (!) 137/56 99 F (37.2 C) Oral 90 18 98 %  02/02/16 2232 - 99.9 F (37.7 C) Oral - - -  02/02/16 2000 (!) 165/70 - - (!) 105 20 98 %  02/02/16 1500 (!) 147/62 99.5 F (37.5 C) Oral 89 20 100 %       Recent laboratory studies:  Recent Labs  02/02/16 0517 02/02/16 1437 02/03/16 0539  WBC 6.7 9.9 9.8  HGB 7.1* 6.8* 5.9*  HCT 22.3* 21.4* 18.6*  PLT 151 136* 128*    Recent Labs  02/02/16 0517 02/03/16 0539  NA 137 138  K 3.5 3.5  CL 101 105  CO2 27 28  BUN 15 12  CREATININE 1.18* 0.95  GLUCOSE 208* 158*  CALCIUM 8.8* 8.6*   Lab Results  Component Value Date   INR 1.02 01/21/2015   INR 1.03 12/25/2014     Recent Radiographic Studies :  Dg Hip Port Unilat With Pelvis 1v Right  Result Date: 02/01/2016 CLINICAL DATA:  Postop day 0 right total hip arthroplasty. EXAM: DG HIP (WITH OR WITHOUT PELVIS) 1V PORT RIGHT COMPARISON:  AP pelvis x-ray 02/02/2015 at the time of left total hip arthroplasty. FINDINGS: Right total hip arthroplasty with anatomic alignment. No acute complicating features. The left total hip arthroplasty demonstrates anatomic alignment in the AP projection. Surgical mesh is noted overlying the upper pelvis. IMPRESSION: Right total hip arthroplasty with anatomic alignment no acute complicating features.  Electronically Signed   By: Evangeline Dakin M.D.   On: 02/01/2016 12:25    DISCHARGE INSTRUCTIONS:   DISCHARGE MEDICATIONS:     Medication List    STOP taking these medications   aspirin 81 MG tablet   HYDROcodone-acetaminophen 10-325 MG tablet Commonly known as:  NORCO Replaced by:  HYDROcodone-acetaminophen  5-325 MG tablet     TAKE these medications   albuterol 108 (90 Base) MCG/ACT inhaler Commonly known as:  PROVENTIL HFA;VENTOLIN HFA Inhale 2 puffs into the lungs 2 (two) times daily as needed for wheezing or shortness of breath.   ALIVE ONCE DAILY WOMENS PO Take 1 tablet by mouth daily.   ALPRAZolam 0.5 MG tablet Commonly known as:  XANAX Take 0.5 mg by mouth 3 (three) times daily.   alum & mag hydroxide-simeth 200-200-20 MG/5ML suspension Commonly known as:  MAALOX/MYLANTA Take 30 mLs by mouth every 6 (six) hours as needed for indigestion or heartburn.   atorvastatin 10 MG tablet Commonly known as:  LIPITOR Take 10 mg by mouth daily.   BIOFLAVONOIDS PO Take 2 tablets by mouth 2 (two) times daily.   citalopram 20 MG tablet Commonly known as:  CELEXA Take 20 mg by mouth daily as needed.   cloNIDine 0.2 MG tablet Commonly known as:  CATAPRES Take 0.1 mg by mouth 2 (two) times daily. Take 1/2 tablet twice daily   cromolyn 5.2 MG/ACT nasal spray Commonly known as:  NASALCROM Place 1 spray into both nostrils daily as needed for allergies.   docusate sodium 100 MG capsule Commonly known as:  COLACE Take 100 mg by mouth daily.   ENSURE Take 237 mLs by mouth daily.   fexofenadine 180 MG tablet Commonly known as:  ALLEGRA Take 180 mg by mouth daily as needed for allergies or rhinitis.   folic acid [id number] MCG tablet Commonly known as:  FOLVITE Take 400 mcg by mouth daily.   furosemide 40 MG tablet Commonly known as:  LASIX Take 40 mg by mouth daily.   GAS-X EXTRA STRENGTH 125 MG chewable tablet Generic drug:  simethicone Chew 125 mg by mouth every 6  (six) hours as needed for flatulence.   HYDROcodone-acetaminophen 5-325 MG tablet Commonly known as:  NORCO/VICODIN Take 1-2 tablets by mouth every 4 (four) hours as needed (breakthrough pain). Replaces:  HYDROcodone-acetaminophen 10-325 MG tablet   hydrOXYzine 10 MG tablet Commonly known as:  ATARAX/VISTARIL Take 10 mg by mouth every 6 (six) hours as needed (itching from hydrocodone/apap).   ipratropium 0.03 % nasal spray Commonly known as:  ATROVENT Place 2 sprays into both nostrils 2 (two) times daily as needed for rhinitis.   levothyroxine 88 MCG tablet Commonly known as:  SYNTHROID, LEVOTHROID Take 88 mcg by mouth daily.   Magnesium 400 MG Tabs Take 400 mg by mouth daily.   methocarbamol 500 MG tablet Commonly known as:  ROBAXIN Take 500 mg by mouth every 6 (six) hours as needed for muscle spasms. What changed:  Another medication with the same name was added. Make sure you understand how and when to take each.   methocarbamol 500 MG tablet Commonly known as:  ROBAXIN Take 1 tablet (500 mg total) by mouth every 8 (eight) hours as needed for muscle spasms. What changed:  You were already taking a medication with the same name, and this prescription was added. Make sure you understand how and when to take each.   metoCLOPramide 10 MG tablet Commonly known as:  REGLAN Take 10 mg by mouth 2 (two) times daily at 8 am and 10 pm.   metoprolol 100 MG tablet Commonly known as:  LOPRESSOR Take 100 mg by mouth 2 (two) times daily.   montelukast 10 MG tablet Commonly known as:  SINGULAIR Take 10 mg by mouth at bedtime.   omeprazole 40 MG capsule Commonly known as:  PRILOSEC Take 40 mg by mouth daily.   pioglitazone 30 MG tablet Commonly known as:  ACTOS Take 30 mg by mouth daily.   quinapril 40 MG tablet Commonly known as:  ACCUPRIL Take 40 mg by mouth daily.   rivaroxaban 10 MG Tabs tablet Commonly known as:  XARELTO Take 1 tablet (10 mg total) by mouth daily with  breakfast.   sodium chloride 0.65 % Soln nasal spray Commonly known as:  OCEAN Place 1 spray into both nostrils as needed for congestion.   SYSTANE OP Place 1 drop into both eyes daily as needed (dry eyes).   Vitamin D (Ergocalciferol) 50000 units Caps capsule Commonly known as:  DRISDOL Take 50,000 Units by mouth 2 (two) times a week. Tuesday and Thursday   VITRON-C 65-125 MG Tabs Generic drug:  Iron-Vitamin C Take 1 tablet by mouth 3 (three) times a week.       FOLLOW UP VISIT:   Follow-up Information    Garald Balding, MD Follow up on 02/16/2016.   Specialty:  Orthopedic Surgery Contact information: [address] [place] [address] [phone]           DISPOSITION:   Home  CONDITION:  Stable   Mike Craze. [place], [place] [phone]  02/04/2016 8:26 AM

## 2016-02-03 NOTE — Consult Note (Addendum)
Primary cardiologist: Dr Dwana Curd  HPI: 65 year old female with PMH DM, hypertension, hypothyroid, status post hip replacement for evaluation of chest pain. Nuclear study July 2016 showed ejection fraction fraction 73; There was no ischemia or infarction. Echocardiogram August 2017 showed normal LV systolic function, grade 2 diastolic dysfunction, mild left atrial enlargement and mild to moderate tricuspid regurgitation. Pt was seen by Dr Bronson Ing 4/17 for preoperative evaluation. Per his notes patient has a fairly long history exertional chest discomfort and dyspnea. Patient underwent hip replacement on August 22. Patient is a somewhat difficult historian. She states she has intermittent dyspnea not necessarily related to activities chronically. She has some pedal edema. She has occasional chest discomfort at home for years.  It typically occurs after eating. No radiation. Not pleuritic or clearly exertional. She states she had some indigestion 2 nights ago. There was associated water brash. Similar to outpatient symptoms. No further chest pain or dyspnea since. Cardiology now asked to evaluate.  Medications Prior to Admission  Medication Sig Dispense Refill  . ALPRAZolam (XANAX) 0.5 MG tablet Take 0.5 mg by mouth 3 (three) times daily.   5  . alum & mag hydroxide-simeth (MAALOX/MYLANTA) 200-200-20 MG/5ML suspension Take 30 mLs by mouth every 6 (six) hours as needed for indigestion or heartburn.    [name] aspirin 81 MG tablet Take 81 mg by mouth daily.    [name] atorvastatin (LIPITOR) 10 MG tablet Take 10 mg by mouth daily.    [name] BIOFLAVONOIDS PO Take 2 tablets by mouth 2 (two) times daily.    . citalopram (CELEXA) 20 MG tablet Take 20 mg by mouth daily as needed.   12  . cloNIDine (CATAPRES) 0.2 MG tablet Take 0.1 mg by mouth 2 (two) times daily. Take 1/2 tablet twice daily    . docusate sodium (COLACE) 100 MG capsule Take 100 mg by mouth daily.    [name] ENSURE (ENSURE) Take 237 mLs by mouth daily.    .  fexofenadine (ALLEGRA) 180 MG tablet Take 180 mg by mouth daily as needed for allergies or rhinitis.    . folic acid (FOLVITE) 341 MCG tablet Take 400 mcg by mouth daily.    [name] HYDROcodone-acetaminophen (NORCO) 10-325 MG per tablet Take 1 tablet by mouth every 4 (four) hours as needed for moderate pain or severe pain (pain). 90 tablet 0  . hydrOXYzine (ATARAX/VISTARIL) 10 MG tablet Take 10 mg by mouth every 6 (six) hours as needed (itching from hydrocodone/apap).    [name] ipratropium (ATROVENT) 0.03 % nasal spray Place 2 sprays into both nostrils 2 (two) times daily as needed for rhinitis.    . Iron-Vitamin C (VITRON-C) 65-125 MG TABS Take 1 tablet by mouth 3 (three) times a week.    . levothyroxine (SYNTHROID, LEVOTHROID) 88 MCG tablet Take 88 mcg by mouth daily.  12  . Magnesium 400 MG TABS Take 400 mg by mouth daily.    . metoprolol (LOPRESSOR) 100 MG tablet Take 100 mg by mouth 2 (two) times daily.  12  . montelukast (SINGULAIR) 10 MG tablet Take 10 mg by mouth at bedtime.  12  . Multiple Vitamins-Minerals (ALIVE ONCE DAILY WOMENS PO) Take 1 tablet by mouth daily.    . pioglitazone (ACTOS) 30 MG tablet Take 30 mg by mouth daily.  6  . Polyethyl Glycol-Propyl Glycol (SYSTANE OP) Place 1 drop into both eyes daily as needed (dry eyes).    . quinapril (ACCUPRIL) 40 MG tablet Take 40 mg by mouth daily.  6  . simethicone (GAS-X EXTRA STRENGTH) 125 MG chewable tablet Chew 125 mg by mouth every 6 (six) hours as needed for flatulence.    . sodium chloride (OCEAN) 0.65 % SOLN nasal spray Place 1 spray into both nostrils as needed for congestion.    . Vitamin D, Ergocalciferol, (DRISDOL) 50000 UNITS CAPS capsule Take 50,000 Units by mouth 2 (two) times a week. Tuesday and Thursday  12  . albuterol (PROVENTIL HFA;VENTOLIN HFA) 108 (90 BASE) MCG/ACT inhaler Inhale 2 puffs into the lungs 2 (two) times daily as needed for wheezing or shortness of breath.    . cromolyn (NASALCROM) 5.2 MG/ACT nasal spray Place 1  spray into both nostrils daily as needed for allergies.    . furosemide (LASIX) 40 MG tablet Take 40 mg by mouth daily.  12  . methocarbamol (ROBAXIN) 500 MG tablet Take 500 mg by mouth every 6 (six) hours as needed for muscle spasms.    . metoCLOPramide (REGLAN) 10 MG tablet Take 10 mg by mouth 2 (two) times daily at 8 am and 10 pm.    . omeprazole (PRILOSEC) 40 MG capsule Take 40 mg by mouth daily.      Allergies  Allergen Reactions  . Other Itching, Nausea Only and Other (See Comments)    Acidic foods cause stomach upset and itching  . Allopurinol Rash  . Penicillins Rash    Name brand  . Sulfa Antibiotics Rash    Past Medical History:  Diagnosis Date  . Allergic rhinitis   . Anemia   . Ankle swelling   . Anxiety    takes Xanax daily   . Arthritis   . Asthma   . Blurred vision, bilateral   . Chronic kidney disease   . Depression    takes Celexa daily  . Diabetes mellitus without complication ([place])    type II; takes Actos daily  . GERD (gastroesophageal reflux disease)   . Gout   . Heart murmur   . Heart palpitations   . Hemorrhoids   . History of blood transfusion    no abnormal reaction noted  . History of bronchitis   . Hyperlipidemia   . Hypertension    takes Catapres and Quinapril daily  . Hypothyroidism    takes Synthroid daily  . Moderate mitral insufficiency    a. 12/2014 Echo: EF 55-60%, mod MR, mildly to moderately dil LA, PASP 45mHg.  . Osteoarthritis   . Paroxysmal supraventricular tachycardia ([place]   . Ringing in ears   . Shortness of breath dyspnea    ALbuterol daily as needed  . Stevens-Johnson disease ([place]   . Urinary incontinence     Past Surgical History:  Procedure Laterality Date  . ABDOMINAL HYSTERECTOMY    . COLON SURGERY    . COLONOSCOPY    . ESOPHAGOGASTRODUODENOSCOPY    . HERNIA REPAIR     hiatal hernia repair  . TOTAL HIP ARTHROPLASTY Left 02/02/2015   Procedure: LEFT TOTAL HIP ARTHROPLASTY;  Surgeon: PGarald Balding MD;   [facility]  Service: Orthopedics;  Laterality: Left;  . TOTAL HIP ARTHROPLASTY Right 02/01/2016   Procedure: TOTAL HIP ARTHROPLASTY;  Surgeon: PGarald Balding MD;  [facility] [facility]  Service: Orthopedics;  Laterality: Right;    Social History   Social History  . Marital status: Single    Spouse name: N/A  . Number of children: N/A  . Years of education: N/A   Occupational History  . Not on file.  Social History Main Topics  . Smoking status: Never Smoker  . Smokeless tobacco: Never Used  . Alcohol use No  . Drug use: No  . Sexual activity: Not on file   Other Topics Concern  . Not on file   Social History Narrative  . No narrative on file    Family History  Problem Relation Age of Onset  . Heart attack Mother   . Cancer Father   . COPD Father     ROS:  Fatigue and hip pain from recent surgery but no fevers or chills, productive cough, hemoptysis, dysphasia, odynophagia, melena, hematochezia, dysuria, hematuria, rash, seizure activity, claudication. Remaining systems are negative.  Physical Exam:   Blood pressure (!) 137/56, pulse 90, temperature 99 F (37.2 C), temperature source Oral, resp. rate 18, height 5' 4.5" (1.638 m), weight 201 lb (91.2 kg), SpO2 98 %.  General:  Well developed/well nourished in NAD Skin warm/dry Patient not depressed No peripheral clubbing Back-normal HEENT-normal/normal eyelids Neck supple/normal carotid upstroke bilaterally; bilateral bruits; no JVD; no thyromegaly chest - CTA/ normal expansion CV - RRR/normal S1 and S2; no rubs or gallops;  PMI nondisplaced, 2/6 systolic ejection murmur Abdomen -NT/ND, no HSM, no mass, + bowel sounds, no bruit 2+ femoral pulses, no bruits, status post right hip surgery Ext-trace edema, no chords, 2+ DP Neuro-grossly nonfocal  ECG sinus rhythm with nonspecific ST changes  Results for orders placed or performed during the hospital encounter of 02/01/16 (from the past 48 hour(s))    Hemoglobin A1c     Status: Abnormal   Collection Time: 02/01/16  3:52 PM  Result Value Ref Range   Hgb A1c MFr Bld 6.0 (H) 4.8 - 5.6 %    Comment: (NOTE)         Pre-diabetes: 5.7 - 6.4         Diabetes: >6.4         Glycemic control for adults with diabetes: <7.0    Mean Plasma Glucose 126 mg/dL    Comment: (NOTE) Performed At: [facility] [place], [place] [id number] Lindon Romp MD [id number]   Glucose, capillary     Status: Abnormal   Collection Time: 02/01/16  4:06 PM  Result Value Ref Range   Glucose-Capillary 187 (H) 65 - 99 mg/dL  Glucose, capillary     Status: Abnormal   Collection Time: 02/01/16  6:19 PM  Result Value Ref Range   Glucose-Capillary 145 (H) 65 - 99 mg/dL  Glucose, capillary     Status: Abnormal   Collection Time: 02/01/16  9:59 PM  Result Value Ref Range   Glucose-Capillary 133 (H) 65 - 99 mg/dL  CBC     Status: Abnormal   Collection Time: 02/02/16  5:17 AM  Result Value Ref Range   WBC 6.7 4.0 - 10.5 K/uL   RBC 2.66 (L) 3.87 - 5.11 MIL/uL   Hemoglobin 7.1 (L) 12.0 - 15.0 g/dL   HCT 22.3 (L) 36.0 - 46.0 %   MCV 83.8 78.0 - 100.0 fL   MCH 26.7 26.0 - 34.0 pg   MCHC 31.8 30.0 - 36.0 g/dL   RDW 13.8 11.5 - 15.5 %   Platelets 151 150 - 400 K/uL  Basic metabolic panel     Status: Abnormal   Collection Time: 02/02/16  5:17 AM  Result Value Ref Range   Sodium 137 135 - 145 mmol/L   Potassium 3.5 3.5 - 5.1 mmol/L   Chloride 101 101 - 111  mmol/L   CO2 27 22 - 32 mmol/L   Glucose, Bld 208 (H) 65 - 99 mg/dL   BUN 15 6 - 20 mg/dL   Creatinine, Ser 1.18 (H) 0.44 - 1.00 mg/dL   Calcium 8.8 (L) 8.9 - 10.3 mg/dL   GFR calc non Af Amer 48 (L) >60 mL/min   GFR calc Af Amer 55 (L) >60 mL/min    Comment: (NOTE) The eGFR has been calculated using the CKD EPI equation. This calculation has not been validated in all clinical situations. eGFR's persistently <60 mL/min signify possible Chronic Kidney Disease.    Anion gap 9 5 -  15  Glucose, capillary     Status: Abnormal   Collection Time: 02/02/16  6:33 AM  Result Value Ref Range   Glucose-Capillary 262 (H) 65 - 99 mg/dL   Comment 1 Notify RN   Troponin I (q 6hr x 3)     Status: None   Collection Time: 02/02/16 10:05 AM  Result Value Ref Range   Troponin I <0.03 <0.03 ng/mL  Glucose, capillary     Status: Abnormal   Collection Time: 02/02/16 10:54 AM  Result Value Ref Range   Glucose-Capillary 131 (H) 65 - 99 mg/dL  Troponin I (q 6hr x 3)     Status: None   Collection Time: 02/02/16  2:37 PM  Result Value Ref Range   Troponin I <0.03 <0.03 ng/mL  CBC with Differential/Platelet     Status: Abnormal   Collection Time: 02/02/16  2:37 PM  Result Value Ref Range   WBC 9.9 4.0 - 10.5 K/uL   RBC 2.57 (L) 3.87 - 5.11 MIL/uL   Hemoglobin 6.8 (LL) 12.0 - 15.0 g/dL    Comment: REPEATED TO VERIFY CRITICAL RESULT CALLED TO, READ BACK BY AND VERIFIED WITH: Jackqulyn Livings, RN AT 1522 ON 8.23.17 BY W JOHNSON    HCT 21.4 (L) 36.0 - 46.0 %   MCV 83.3 78.0 - 100.0 fL   MCH 26.5 26.0 - 34.0 pg   MCHC 31.8 30.0 - 36.0 g/dL   RDW 13.7 11.5 - 15.5 %   Platelets 136 (L) 150 - 400 K/uL   Neutrophils Relative % 71 %   Lymphocytes Relative 17 %   Monocytes Relative 12 %   Eosinophils Relative 0 %   Basophils Relative 0 %   Neutro Abs 7.0 1.7 - 7.7 K/uL   Lymphs Abs 1.7 0.7 - 4.0 K/uL   Monocytes Absolute 1.2 (H) 0.1 - 1.0 K/uL   Eosinophils Absolute 0.0 0.0 - 0.7 K/uL   Basophils Absolute 0.0 0.0 - 0.1 K/uL  Glucose, capillary     Status: Abnormal   Collection Time: 02/02/16  5:07 PM  Result Value Ref Range   Glucose-Capillary 128 (H) 65 - 99 mg/dL  Troponin I (q 6hr x 3)     Status: Abnormal   Collection Time: 02/02/16  7:57 PM  Result Value Ref Range   Troponin I 0.03 (HH) <0.03 ng/mL    Comment: CRITICAL RESULT CALLED TO, READ BACK BY AND VERIFIED WITH: P GORNICK,RN 2059 02/02/16 D BRADLEY   Glucose, capillary     Status: Abnormal   Collection Time: 02/02/16 10:27  PM  Result Value Ref Range   Glucose-Capillary 189 (H) 65 - 99 mg/dL  CBC     Status: Abnormal   Collection Time: 02/03/16  5:39 AM  Result Value Ref Range   WBC 9.8 4.0 - 10.5 K/uL   RBC 2.26 (L) 3.87 -  5.11 MIL/uL   Hemoglobin 5.9 (LL) 12.0 - 15.0 g/dL    Comment: REPEATED TO VERIFY CRITICAL RESULT CALLED TO, READ BACK BY AND VERIFIED WITH: P.GORNICK,RN 2563 02/03/16 G.MCADOO    HCT 18.6 (L) 36.0 - 46.0 %   MCV 82.3 78.0 - 100.0 fL   MCH 26.1 26.0 - 34.0 pg   MCHC 31.7 30.0 - 36.0 g/dL   RDW 13.8 11.5 - 15.5 %   Platelets 128 (L) 150 - 400 K/uL  Basic metabolic panel     Status: Abnormal   Collection Time: 02/03/16  5:39 AM  Result Value Ref Range   Sodium 138 135 - 145 mmol/L   Potassium 3.5 3.5 - 5.1 mmol/L   Chloride 105 101 - 111 mmol/L   CO2 28 22 - 32 mmol/L   Glucose, Bld 158 (H) 65 - 99 mg/dL   BUN 12 6 - 20 mg/dL   Creatinine, Ser 0.95 0.44 - 1.00 mg/dL   Calcium 8.6 (L) 8.9 - 10.3 mg/dL   GFR calc non Af Amer >60 >60 mL/min   GFR calc Af Amer >60 >60 mL/min    Comment: (NOTE) The eGFR has been calculated using the CKD EPI equation. This calculation has not been validated in all clinical situations. eGFR's persistently <60 mL/min signify possible Chronic Kidney Disease.    Anion gap 5 5 - 15  Troponin I     Status: Abnormal   Collection Time: 02/03/16  5:39 AM  Result Value Ref Range   Troponin I 0.05 (HH) <0.03 ng/mL    Comment: CRITICAL VALUE NOTED.  VALUE IS CONSISTENT WITH PREVIOUSLY REPORTED AND CALLED VALUE.  Glucose, capillary     Status: Abnormal   Collection Time: 02/03/16  7:00 AM  Result Value Ref Range   Glucose-Capillary 154 (H) 65 - 99 mg/dL  CBC     Status: Abnormal   Collection Time: 02/03/16  8:38 AM  Result Value Ref Range   WBC 10.3 4.0 - 10.5 K/uL   RBC 2.35 (L) 3.87 - 5.11 MIL/uL   Hemoglobin 6.2 (LL) 12.0 - 15.0 g/dL    Comment: REPEATED TO VERIFY CRITICAL VALUE NOTED.  VALUE IS CONSISTENT WITH PREVIOUSLY REPORTED AND CALLED  VALUE.    HCT 19.6 (L) 36.0 - 46.0 %   MCV 83.4 78.0 - 100.0 fL   MCH 26.4 26.0 - 34.0 pg   MCHC 31.6 30.0 - 36.0 g/dL   RDW 13.9 11.5 - 15.5 %   Platelets 143 (L) 150 - 400 K/uL  Glucose, capillary     Status: Abnormal   Collection Time: 02/03/16 12:21 PM  Result Value Ref Range   Glucose-Capillary 145 (H) 65 - 99 mg/dL    No results found.  Assessment/Plan 1 chest pain-symptoms are atypical and chronic. Previous nuclear study negative. Troponin minimally elevated but no clear trend and not consistent with acute coronary syndrome. Would not pursue further ischemia evaluation. 2 acute blood loss anemia-patient may require transfusion. Will leave to orthopedic surgery. 3 Hypertension-continue present  blood pressure medications at discharge. 4 status post hip replacement-management per orthopedics. 5 hyperlipidemia-continue statin. 6 DM 7 Bilateral carotid bruits-will need fu carotid dopplers when he is seen back by Dr Bronson Ing. FU with Dr Bronson Ing 4-6 weeks following DC.  We will sign off. Please call with questions.  Kirk Ruths MD 02/03/2016, 1:12 PM

## 2016-02-03 NOTE — Care Management (Signed)
Case manager spoke with patient again this morning concerning home health. Patient states she has changed her mind and knows she needs home health, but knows which agency she does not want. CM explained to patient that she has choice and we reviewed available agencies. Referral was called to [facility], [facility]iaison.  Patient has DME at home and wil have family support.

## 2016-02-03 NOTE — Progress Notes (Addendum)
CRITICAL VALUE ALERT  Critical value received:  Hgb = 5.9  Date of notification:  02/03/16  Time of notification:  0648  Critical value read back: yes  Nurse who received alert:  Kristopher Glee, RN  MD notified (1st page):  Dr. Erlinda Hong (on call)  Time of first page:  [phone]  MD notified (2nd page):  Time of second page:  Responding MD: Dr. Erlinda Hong  Time MD responded: [phone]  Orders: Wait for Dr to round and give orders.

## 2016-02-03 NOTE — Progress Notes (Signed)
Occupational Therapy Treatment Patient Details Name: [name] MRN: [id number] DOB: [birth year] Today's Date: 02/03/2016    History of present illness patient is a 65 yo female, s/p R THA. (pmh includes previous left THA one year ago)   OT comments  Pt completed UB bathing, dressing and toileting with pericare with min to min guard assist. Pt continue to move very slowly.   Follow Up Recommendations  No OT follow up    Equipment Recommendations  None recommended by OT    Recommendations for Other Services      Precautions / Restrictions Precautions Precautions: Posterior Hip;Fall Precaution Booklet Issued: No Precaution Comments: cued to maintain hip precautions during pericare and bathing Required Braces or Orthoses: Knee Immobilizer - Right Restrictions Weight Bearing Restrictions: Yes RLE Weight Bearing: Weight bearing as tolerated       Mobility Bed Mobility      General bed mobility comments: pt in chair  Transfers Overall transfer level: Needs assistance Equipment used: Rolling walker (2 wheeled) Transfers: Sit to/from [facility] Sit to Stand: Min guard Stand pivot transfers: Min guard       General transfer comment: cues for upright posture, increased time, good hand placement    Balance Overall balance assessment: Needs assistance   Sitting balance-Leahy Scale: Fair       Standing balance-Leahy Scale: Fair Standing balance comment: able to release walker with UEs to perform pericare                   ADL Overall ADL's : Needs assistance/impaired     Grooming: Wash/dry hands;Wash/dry face;Sitting;Set up   Upper Body Bathing: Minimal assitance;Sitting Upper Body Bathing Details (indicate cue type and reason): assisted with back   Lower Body Bathing Details (indicate cue type and reason): educated pt to use her long bath sponge Upper Body Dressing : Minimal assistance;Sitting Upper Body Dressing Details (indicate cue  type and reason): due to IV line     Toilet Transfer: Min guard;Stand-pivot;BSC;RW   Toileting- Clothing Manipulation and Hygiene: Min guard;Sit to/from stand       Functional mobility during ADLs: Min guard;Rolling walker General ADL Comments: Pt performed UB bathing, dressing and pericare seated on 3 in 1 over toilet      Vision                     Perception     Praxis      Cognition   Behavior During Therapy: WFL for tasks assessed/performed Overall Cognitive Status: Within Functional Limits for tasks assessed                       Extremity/Trunk Assessment               Exercises     Shoulder Instructions       General Comments      Pertinent Vitals/ Pain       Pain Assessment: Faces Pain Score: 4  Faces Pain Scale: Hurts little more Pain Location: R hip Pain Descriptors / Indicators: Sore;Guarding Pain Intervention(s): Repositioned;Ice applied;Premedicated before session;Monitored during session  Alpha expects to be discharged to:: Private residence                                        Prior Functioning/Environment  Frequency Min 2X/week     Progress Toward Goals  OT Goals(current goals can now be found in the care plan section)  Progress towards OT goals: Progressing toward goals  Acute Rehab OT Goals Patient Stated Goal: to go home Time For Goal Achievement: 02/09/16 Potential to Achieve Goals: Good  Plan Discharge plan remains appropriate    Co-evaluation                 End of Session Equipment Utilized During Treatment: Rolling walker   Activity Tolerance Patient tolerated treatment well   Patient Left in chair;with call bell/phone within reach;with family/visitor present   Nurse Communication  (pt has bathed)        Time: [id number] OT Time Calculation (min): 25 min  Charges: OT General Charges $OT Visit: 1 Procedure OT Treatments $Self  Care/Home Management : 23-37 mins  Malka So 02/03/2016, 10:56 AM  [phone]

## 2016-02-04 DIAGNOSIS — M1611 Unilateral primary osteoarthritis, right hip: Principal | ICD-10-CM

## 2016-02-04 LAB — GLUCOSE, CAPILLARY
GLUCOSE-CAPILLARY: 151 mg/dL — AB (ref 65–99)
GLUCOSE-CAPILLARY: 135 mg/dL — AB (ref 65–99)

## 2016-02-04 LAB — TYPE AND SCREEN
ABO/RH(D): O POS
ANTIBODY SCREEN: NEGATIVE
UNIT DIVISION: 0
Unit division: 0

## 2016-02-04 LAB — CBC
HCT: 25.5 % — ABNORMAL LOW (ref 36.0–46.0)
HEMOGLOBIN: 8.4 g/dL — AB (ref 12.0–15.0)
MCH: 28 pg (ref 26.0–34.0)
MCHC: 32.9 g/dL (ref 30.0–36.0)
MCV: 85 fL (ref 78.0–100.0)
PLATELETS: 128 10*3/uL — AB (ref 150–400)
RBC: 3 MIL/uL — AB (ref 3.87–5.11)
RDW: 14.2 % (ref 11.5–15.5)
WBC: 10 10*3/uL (ref 4.0–10.5)

## 2016-02-04 LAB — BASIC METABOLIC PANEL
Anion gap: 7 (ref 5–15)
BUN: 16 mg/dL (ref 6–20)
CO2: 27 mmol/L (ref 22–32)
CREATININE: 1.07 mg/dL — AB (ref 0.44–1.00)
Calcium: 8.8 mg/dL — ABNORMAL LOW (ref 8.9–10.3)
Chloride: 105 mmol/L (ref 101–111)
GFR, EST NON AFRICAN AMERICAN: 54 mL/min — AB (ref >60)
Glucose, Bld: 145 mg/dL — ABNORMAL HIGH (ref 65–99)
POTASSIUM: 3.6 mmol/L (ref 3.5–5.1)
SODIUM: 139 mmol/L (ref 135–145)

## 2016-02-04 MED ORDER — PNEUMOCOCCAL VAC POLYVALENT 25 MCG/0.5ML IJ INJ
0.5000 mL | INJECTION | INTRAMUSCULAR | Status: AC
  Administered 2016-02-04: 0.5 mL via INTRAMUSCULAR
  Filled 2016-02-04: qty 0.5

## 2016-02-04 NOTE — Progress Notes (Signed)
Occupational Therapy Treatment Patient Details Name: [name] MRN: [id number] DOB: [birth year] Today's Date: 02/04/2016    History of present illness patient is a 65 yo female, s/p R THA. (pmh includes previous left THA one year ago)   OT comments  Pt. Reports feeling much better and stronger today!  Able to ambulate to/from b.room for toileting tasks.  Continues to move slow but is safe with no LOB noted.  Reports she has all DME and will have family assist at home.  Clear for d/c from OT standpoint.  Will alert OTR/l to sign off.    Follow Up Recommendations  No OT follow up    Equipment Recommendations  None recommended by OT    Recommendations for Other Services      Precautions / Restrictions Precautions Precautions: Posterior Hip;Fall Precaution Comments: cued to maintain hip precautions during pericare and bathing Required Braces or Orthoses: Knee Immobilizer - Right Restrictions RLE Weight Bearing: Weight bearing as tolerated       Mobility Bed Mobility               General bed mobility comments: in recliner upon arrival into room  Transfers Overall transfer level: Needs assistance Equipment used: Rolling walker (2 wheeled) Transfers: Sit to/from [facility] Sit to Stand: Min guard Stand pivot transfers: Supervision       General transfer comment: cues for upright posture, increased time, good hand placement    Balance                                   ADL Overall ADL's : Needs assistance/impaired     Grooming: Wash/dry hands;Sitting;Set up                   Toilet Transfer: Min guard;Ambulation;RW;BSC;Regular Glass blower/designer Details (indicate cue type and reason): pt. has a 3n1 over commode at home and an additional 3n1 she keeps beside her bed Toileting- Clothing Manipulation and Hygiene: Set up;Sitting/lateral lean;Min guard;Sit to/from stand Toileting - Clothing Manipulation Details (indicate  cue type and reason): set up seated lateral leans for buttocks, and min guard standing for front peri care (per CNA report, as pt. had BM prior to my arrival)     Functional mobility during ADLs: Min guard;Rolling walker General ADL Comments: pt. able to amb. to/from b.room today and sit on 3n1 over commode.  moves VERY slow but safe and no LOB noted      Vision                     Perception     Praxis      Cognition   Behavior During Therapy: WFL for tasks assessed/performed Overall Cognitive Status: Within Functional Limits for tasks assessed                       Extremity/Trunk Assessment               Exercises     Shoulder Instructions       General Comments  reports having all DME and A/E from recent L hip sx. Last year.  Currently sponge bathes and has family assist.  Demonstrated ability to/from b.room but also has 3n1 bedside.      Pertinent Vitals/ Pain       Pain Assessment: 0-10 Pain Score: 7  Pain Location: R hip Pain Descriptors /  Indicators: Aching Pain Intervention(s): Limited activity within patient's tolerance;Monitored during session;Premedicated before session  Home Living                                          Prior Functioning/Environment              Frequency Min 2X/week     Progress Toward Goals  OT Goals(current goals can now be found in the care plan section)  Progress towards OT goals: Goals met/education completed, patient discharged from [facility] Discharge plan remains appropriate    Co-evaluation                 End of Session Equipment Utilized During Treatment: Gait belt;Rolling walker   Activity Tolerance Patient tolerated treatment well   Patient Left in chair;with call bell/phone within reach;with chair alarm set   Nurse Communication          Time: [phone] OT Time Calculation (min): 28 min  Charges: OT General Charges $OT Visit: 1 Procedure OT  Treatments $Self Care/Home Management : 23-37 mins  Janice Coffin, COTA/L 02/04/2016, 7:36 AM

## 2016-02-04 NOTE — Progress Notes (Signed)
Pt ready for discharge. Education/instructions reviewed with pt and all questions/concerns addressed. IV removed and belongings gathered. PT will be transported out via wheelchair to sister's vehicle. Will continue to monitor

## 2016-02-04 NOTE — Progress Notes (Signed)
Physical Therapy Treatment Patient Details Name: [name] MRN: [id number] DOB: 23-Oct-1950 Today's Date: 02/04/2016    History of Present Illness patient is a 65 yo female, s/p R THA. (pmh includes previous left THA one year ago)    PT Comments    Pt with more energy and alertness post transfusion.  Pt tolerated activity and reviewed HEP and gait training.  Pt is ready to d/c at this time.    Follow Up Recommendations  Home health PT;Supervision/Assistance - 24 hour     Equipment Recommendations  Other (comment)    Recommendations for Other Services       Precautions / Restrictions Precautions Precautions: Posterior Hip;Fall Precaution Booklet Issued: Yes (comment) (issued in pm yesterday to sister "Stanton Kidney" who will be staying with her.  ) Precaution Comments: cued to maintain hip precautions during transfers and gait training.   Required Braces or Orthoses: Knee Immobilizer - Right Restrictions Weight Bearing Restrictions: Yes RLE Weight Bearing: Weight bearing as tolerated    Mobility  Bed Mobility               General bed mobility comments: in recliner upon arrival into room  Transfers Overall transfer level: Needs assistance Equipment used: Rolling walker (2 wheeled) Transfers: Sit to/from Stand Sit to Stand: Supervision Stand pivot transfers: Supervision       General transfer comment: Improved ability with sit to stand transfer  Ambulation/Gait Ambulation/Gait assistance: Min guard Ambulation Distance (Feet): 54 Feet Assistive device: Rolling walker (2 wheeled) Gait Pattern/deviations: Step-to pattern;Trunk flexed;Antalgic   Gait velocity interpretation: <1.8 ft/sec, indicative of risk for recurrent falls General Gait Details: Pt required cues for upper trunk control and RW safety.  Pt required cues for encouragement to advance gait distance.  Cadence remains slow but improved speed noted.     Stairs            Wheelchair Mobility     Modified Rankin (Stroke Patients Only)       Balance Overall balance assessment: Needs assistance   Sitting balance-Leahy Scale: Good       Standing balance-Leahy Scale: Fair                      Cognition Arousal/Alertness: Awake/alert Behavior During Therapy: WFL for tasks assessed/performed Overall Cognitive Status: Within Functional Limits for tasks assessed                      Exercises Total Joint Exercises Ankle Circles/Pumps: AROM;10 reps;Supine;Both Quad Sets: AROM;Right;10 reps;Supine Short Arc Quad: AROM;Right;10 reps;Supine Heel Slides: AROM;Right;10 reps;Supine Hip ABduction/ADduction: AROM;Right;20 reps;Standing;Supine (1x10 in supine and 1x10 in standing.  ) Long Arc Quad: AROM;Right;10 reps;Seated Knee Flexion: AROM;Right;10 reps;Standing Marching in Standing: AROM;Right;10 reps;Standing Standing Hip Extension: AROM;Right;10 reps;Standing    General Comments        Pertinent Vitals/Pain Pain Assessment: 0-10 Pain Location: R hip.   Pain Descriptors / Indicators: Aching Pain Intervention(s): Monitored during session;Repositioned;Ice applied    Home Living                      Prior Function            PT Goals (current goals can now be found in the care plan section) Acute Rehab PT Goals Patient Stated Goal: to go home Potential to Achieve Goals: Good Progress towards PT goals: Progressing toward goals    Frequency  7X/week    PT Plan Current plan remains  appropriate    Co-evaluation             End of Session Equipment Utilized During Treatment: Gait belt Activity Tolerance: Patient tolerated treatment well Patient left: in chair;with family/visitor present;with call bell/phone within reach     Time: 1105-1136 PT Time Calculation (min) (ACUTE ONLY): 31 min  Charges:  $Gait Training: 8-22 mins $Therapeutic Exercise: 8-22 mins                    G Codes:      Cristela Blue Feb 16, 2016, 1:09  PM  Governor Rooks, PTA pager [phone]

## 2016-02-04 NOTE — Progress Notes (Signed)
Patient ID: BRANTLEIGH SEDLAR, female   DOB: 1951/04/15, 65 y.o.   MRN: [id number] PATIENT ID: CHERAE KIMAK        MRN:  [id number]          DOB/AGE: 08/27/1950 / 65 y.o.    [name], MD   [name], PA-C [address] [place], [place]  [address]                             [phone]   PROGRESS NOTE  Subjective:  negative for Chest Pain  negative for Shortness of Breath  negative for Nausea/Vomiting   negative for Calf Pain    Tolerating Diet: yes         Patient reports pain as mild.     Sitting up in bed eating breakfast, comfortable without complaints  Objective: Vital signs in last 24 hours:   Patient Vitals for the past 24 hrs:  BP Temp Temp src Pulse Resp SpO2  02/04/16 0700 (!) 147/63 99.5 F (37.5 C) Oral 89 16 100 %  02/03/16 2315 (!) 115/51 98.4 F (36.9 C) Oral 82 16 98 %  02/03/16 2035 134/62 99.5 F (37.5 C) Oral 93 16 100 %  02/03/16 2014 (!) 124/53 99.3 F (37.4 C) Oral 92 16 99 %  02/03/16 1925 (!) 106/49 99 F (37.2 C) Oral 90 18 100 %  02/03/16 1616 (!) 106/47 99.3 F (37.4 C) Oral 88 18 100 %  02/03/16 1545 (!) 116/52 99 F (37.2 C) Oral 90 18 99 %  02/03/16 1500 (!) 103/44 99.3 F (37.4 C) Oral 88 18 98 %      Intake/Output from previous day:   08/24 0701 - 08/25 0700 In: 2146 [P.O.:480; I.V.:1003] Out: -    Intake/Output this shift:   No intake/output data recorded.   Intake/Output      08/24 0701 - 08/25 0700 08/25 0701 - 08/26 0700   P.O. 480    I.V. (mL/kg) 1003 (11)    Blood 663    Total Intake(mL/kg) 2146 (23.5)    Net +2146          Urine Occurrence 4 x 1 x   Stool Occurrence  1 x      LABORATORY DATA:  Recent Labs  02/02/16 0517 02/02/16 1437 02/03/16 0539 02/03/16 0838 02/04/16 0106  WBC 6.7 9.9 9.8 10.3 10.0  HGB 7.1* 6.8* 5.9* 6.2* 8.4*  HCT 22.3* 21.4* 18.6* 19.6* 25.5*  PLT 151 136* 128* 143* 128*    Recent Labs  02/02/16 0517 02/03/16 0539 02/04/16 0106  NA 137 138 139  K 3.5 3.5 3.6  CL  101 105 105  CO2 27 28 27   BUN 15 12 16   CREATININE 1.18* 0.95 1.07*  GLUCOSE 208* 158* 145*  CALCIUM 8.8* 8.6* 8.8*   Lab Results  Component Value Date   INR 1.02 01/21/2015   INR 1.03 12/25/2014    Recent Radiographic Studies :  Dg Hip Port Unilat With Pelvis 1v Right  Result Date: 02/01/2016 CLINICAL DATA:  Postop day 0 right total hip arthroplasty. EXAM: DG HIP (WITH OR WITHOUT PELVIS) 1V PORT RIGHT COMPARISON:  AP pelvis x-ray 02/02/2015 at the time of left total hip arthroplasty. FINDINGS: Right total hip arthroplasty with anatomic alignment. No acute complicating features. The left total hip arthroplasty demonstrates anatomic alignment in the AP projection. Surgical mesh is noted overlying the upper pelvis. IMPRESSION: Right total hip  arthroplasty with anatomic alignment no acute complicating features. Electronically Signed   By: Evangeline Dakin M.D.   On: 02/01/2016 12:25     Examination:  General appearance: alert, cooperative and no distress  Wound Exam: clean, dry, intact   Drainage:  None: wound tissue dry  Motor Exam: EHL, FHL, Anterior Tibial and Posterior Tibial Intact  Sensory Exam: Superficial Peroneal, Deep Peroneal and Tibial normal  Vascular Exam: Normal  Assessment:    3 Days Post-Op  Procedure(s) (LRB): TOTAL HIP ARTHROPLASTY (Right)  ADDITIONAL DIAGNOSIS:  Principal Problem:   Osteoarthritis of right hip Active Problems:   S/P total hip arthroplasty  Acute Blood Loss Anemia-has had two units of packed cells-feeling better   Plan: Physical Therapy as ordered Weight Bearing as Tolerated (WBAT)  DVT Prophylaxis:  Xarelto and TED hose  DISCHARGE PLAN: Home  DISCHARGE NEEDS:HHPT, [facility]   cardiology has cleared without acute cardiac event, lab better and stable after transfusion..will discharge today with f/u in 2 weeks     Garald Balding  02/04/2016 7:37 AM

## 2016-03-10 ENCOUNTER — Other Ambulatory Visit: Payer: Self-pay | Admitting: *Deleted

## 2016-03-10 MED ORDER — VITAMIN D (ERGOCALCIFEROL) 1.25 MG (50000 UNIT) PO CAPS: 50000.0000 [IU] | ORAL_CAPSULE | ORAL | 6 refills | Status: DC

## 2016-03-29 ENCOUNTER — Ambulatory Visit (INDEPENDENT_AMBULATORY_CARE_PROVIDER_SITE_OTHER): Payer: BLUE CROSS/BLUE SHIELD | Admitting: Orthopaedic Surgery

## 2016-03-29 DIAGNOSIS — M1611 Unilateral primary osteoarthritis, right hip: Secondary | ICD-10-CM

## 2016-04-13 ENCOUNTER — Ambulatory Visit (INDEPENDENT_AMBULATORY_CARE_PROVIDER_SITE_OTHER): Payer: Medicare Other | Admitting: Physician Assistant

## 2016-04-13 ENCOUNTER — Encounter: Payer: Self-pay | Admitting: Physician Assistant

## 2016-04-13 VITALS — BP 201/94 | HR 68 | Temp 97.6°F | Ht 64.5 in | Wt 194.8 lb

## 2016-04-13 DIAGNOSIS — E119 Type 2 diabetes mellitus without complications: Secondary | ICD-10-CM | POA: Diagnosis not present

## 2016-04-13 DIAGNOSIS — Z23 Encounter for immunization: Secondary | ICD-10-CM | POA: Diagnosis not present

## 2016-04-13 DIAGNOSIS — M159 Polyosteoarthritis, unspecified: Secondary | ICD-10-CM

## 2016-04-13 DIAGNOSIS — E669 Obesity, unspecified: Secondary | ICD-10-CM | POA: Insufficient documentation

## 2016-04-13 DIAGNOSIS — M810 Age-related osteoporosis without current pathological fracture: Secondary | ICD-10-CM | POA: Diagnosis not present

## 2016-04-13 DIAGNOSIS — R635 Abnormal weight gain: Secondary | ICD-10-CM

## 2016-04-13 DIAGNOSIS — N189 Chronic kidney disease, unspecified: Secondary | ICD-10-CM

## 2016-04-13 DIAGNOSIS — I10 Essential (primary) hypertension: Secondary | ICD-10-CM | POA: Diagnosis not present

## 2016-04-13 DIAGNOSIS — J0100 Acute maxillary sinusitis, unspecified: Secondary | ICD-10-CM | POA: Diagnosis not present

## 2016-04-13 DIAGNOSIS — Z6832 Body mass index (BMI) 32.0-32.9, adult: Secondary | ICD-10-CM | POA: Diagnosis not present

## 2016-04-13 DIAGNOSIS — M1611 Unilateral primary osteoarthritis, right hip: Secondary | ICD-10-CM

## 2016-04-13 LAB — BAYER DCA HB A1C WAIVED: HB A1C (BAYER DCA - WAIVED): 6.1 % (ref <7.0)

## 2016-04-13 MED ORDER — MELOXICAM 7.5 MG PO TABS: 7.5000 mg | ORAL_TABLET | Freq: Every day | ORAL | 6 refills | Status: DC

## 2016-04-13 MED ORDER — HYDRALAZINE HCL 25 MG PO TABS: 25.0000 mg | ORAL_TABLET | Freq: Three times a day (TID) | ORAL | 6 refills | Status: DC

## 2016-04-13 MED ORDER — METHYLPREDNISOLONE ACETATE 80 MG/ML IJ SUSP
80.0000 mg | Freq: Once | INTRAMUSCULAR | Status: AC
  Administered 2016-04-13: 80 mg via INTRAMUSCULAR

## 2016-04-13 MED ORDER — AZITHROMYCIN 250 MG PO TABS: ORAL_TABLET | ORAL | 0 refills | Status: DC

## 2016-04-13 NOTE — Patient Instructions (Signed)

## 2016-04-14 LAB — CBC WITH DIFFERENTIAL/PLATELET
BASOS ABS: 0 10*3/uL (ref 0.0–0.2)
Basos: 0 %
EOS (ABSOLUTE): 0.2 10*3/uL (ref 0.0–0.4)
Eos: 3 %
HEMOGLOBIN: 11.4 g/dL (ref 11.1–15.9)
Hematocrit: 35.9 % (ref 34.0–46.6)
IMMATURE GRANS (ABS): 0 10*3/uL (ref 0.0–0.1)
Immature Granulocytes: 0 %
LYMPHS: 23 %
Lymphocytes Absolute: 1.3 10*3/uL (ref 0.7–3.1)
MCH: 25.9 pg — AB (ref 26.6–33.0)
MCHC: 31.8 g/dL (ref 31.5–35.7)
MCV: 81 fL (ref 79–97)
MONOCYTES: 7 %
Monocytes Absolute: 0.4 10*3/uL (ref 0.1–0.9)
NEUTROS PCT: 67 %
Neutrophils Absolute: 3.8 10*3/uL (ref 1.4–7.0)
PLATELETS: 209 10*3/uL (ref 150–379)
RBC: 4.41 x10E6/uL (ref 3.77–5.28)
RDW: 14.2 % (ref 12.3–15.4)
WBC: 5.6 10*3/uL (ref 3.4–10.8)

## 2016-04-14 LAB — CMP14+EGFR
ALBUMIN: 4.4 g/dL (ref 3.6–4.8)
ALK PHOS: 147 IU/L — AB (ref 39–117)
ALT: 9 IU/L (ref 0–32)
AST: 18 IU/L (ref 0–40)
Albumin/Globulin Ratio: 1.2 (ref 1.2–2.2)
BILIRUBIN TOTAL: 0.2 mg/dL (ref 0.0–1.2)
BUN / CREAT RATIO: 21 (ref 12–28)
BUN: 20 mg/dL (ref 8–27)
CHLORIDE: 102 mmol/L (ref 96–106)
CO2: 26 mmol/L (ref 18–29)
CREATININE: 0.96 mg/dL (ref 0.57–1.00)
Calcium: 10.5 mg/dL — ABNORMAL HIGH (ref 8.7–10.3)
GFR calc non Af Amer: 63 mL/min/{1.73_m2} (ref >59)
GFR, EST AFRICAN AMERICAN: 72 mL/min/{1.73_m2} (ref >59)
GLUCOSE: 110 mg/dL — AB (ref 65–99)
Globulin, Total: 3.8 g/dL (ref 1.5–4.5)
Potassium: 4.2 mmol/L (ref 3.5–5.2)
Sodium: 143 mmol/L (ref 134–144)
TOTAL PROTEIN: 8.2 g/dL (ref 6.0–8.5)

## 2016-04-14 LAB — LIPID PANEL
CHOLESTEROL TOTAL: 160 mg/dL (ref 100–199)
Chol/HDL Ratio: 3.7 ratio units (ref 0.0–4.4)
HDL: 43 mg/dL (ref >39)
LDL CALC: 97 mg/dL (ref 0–99)
Triglycerides: 102 mg/dL (ref 0–149)
VLDL CHOLESTEROL CAL: 20 mg/dL (ref 5–40)

## 2016-04-14 LAB — TSH: TSH: 1.09 u[IU]/mL (ref 0.450–4.500)

## 2016-04-17 ENCOUNTER — Ambulatory Visit: Payer: Medicare Other | Admitting: Pediatrics

## 2016-04-17 NOTE — Progress Notes (Signed)
BP (!) 201/94   Pulse 68   Temp 97.6 F (36.4 C) (Oral)   Ht 5' 4.5" (1.638 m)   Wt 194 lb 12.8 oz (88.4 kg)   BMI 32.92 kg/m    Subjective:    Patient ID: [name], female    DOB: June 10, 1951, 65 y.o.   MRN: [id number]  [name] is a 65 y.o. female presenting on 04/13/2016 for Follow-up; Allergies; Sinusitis; and Arthritis  Patient here to be established as new patient at [facility].  This patient is known to me from [facility]. She has multiple complicated long term medical conditions. All records are reviewed and updated. She has hypertension that has been hard to control at times. She is elevated lately. All meds are being taken.  She has had both hips replaced, last one had complication of blood loss and need for transfusion for the anemia. Will recheck that today. Also need slabs for kidneys, diabetes, and lipid checked. Sinusitis  Associated symptoms include congestion, coughing and a sore throat.  This has gone on for a week with severe headache, congestion, no dizziness.  Denies fever or chills.  Past Medical History:  Diagnosis Date  . Allergic rhinitis   . Allergy   . Anemia   . Ankle swelling   . Anxiety    takes Xanax daily   . Arthritis   . Asthma   . Blurred vision, bilateral   . Chronic kidney disease   . Depression    takes Celexa daily  . Diabetes mellitus without complication ([place])    type II; takes Actos daily  . GERD (gastroesophageal reflux disease)   . Gout   . Heart murmur   . Heart palpitations   . Hemorrhoids   . History of blood transfusion    no abnormal reaction noted  . History of bronchitis   . Hyperlipidemia   . Hypertension    takes Catapres and Quinapril daily  . Hypothyroidism    takes Synthroid daily  . Moderate mitral insufficiency    a. 12/2014 Echo: EF 55-60%, mod MR, mildly to moderately dil LA, PASP 45mHg.  . Osteoarthritis   . Paroxysmal supraventricular tachycardia ([place]   .  Ringing in ears   . Shortness of breath dyspnea    ALbuterol daily as needed  . Stevens[place]Johnson disease ([place]   . Urinary incontinence    Relevant past medical, surgical, family and social history reviewed and updated as indicated. Interim medical history since our last visit reviewed. Allergies and medications reviewed and updated.   Data reviewed from any sources in EPIC.  Review of Systems  Constitutional: Negative.  Negative for activity change.  HENT: Positive for congestion, postnasal drip and sore throat.   Eyes: Negative.   Respiratory: Positive for cough and wheezing.   Cardiovascular: Negative.  Negative for chest pain.  Gastrointestinal: Negative.  Negative for abdominal pain.  Endocrine: Negative.   Genitourinary: Negative.   Musculoskeletal: Positive for arthralgias and gait problem.  Skin: Negative.      Social History   Social History  . Marital status: Single    Spouse name: N/A  . Number of children: N/A  . Years of education: N/A   Occupational History  . Not on file.   Social History Main Topics  . Smoking status: Never Smoker  . Smokeless tobacco: Never Used  . Alcohol use No  . Drug use: No  . Sexual activity: Not on file  Other Topics Concern  . Not on file   Social History Narrative  . No narrative on file    Past Surgical History:  Procedure Laterality Date  . ABDOMINAL HYSTERECTOMY    . COLON SURGERY    . COLONOSCOPY    . ESOPHAGOGASTRODUODENOSCOPY    . HERNIA REPAIR     hiatal hernia repair  . TOTAL HIP ARTHROPLASTY Left 02/02/2015   Procedure: LEFT TOTAL HIP ARTHROPLASTY;  Surgeon: Garald Balding, MD;  Location: [facility];  Service: Orthopedics;  Laterality: Left;  . TOTAL HIP ARTHROPLASTY Right 02/01/2016   Procedure: TOTAL HIP ARTHROPLASTY;  Surgeon: Garald Balding, MD;  Location: [facility];  Service: Orthopedics;  Laterality: Right;    Family History  Problem Relation Age of Onset  . Heart attack Mother   . Cancer Father     . COPD Father       Medication List       Accurate as of 04/13/16 11:59 PM. Always use your most recent med list.          albuterol 108 (90 Base) MCG/ACT inhaler Commonly known as:  PROVENTIL HFA;VENTOLIN HFA Inhale 2 puffs into the lungs 2 (two) times daily as needed for wheezing or shortness of breath.   ALIVE ONCE DAILY WOMENS PO Take 1 tablet by mouth daily.   ALPRAZolam 0.5 MG tablet Commonly known as:  XANAX Take 0.5 mg by mouth 3 (three) times daily.   alum & mag hydroxide-simeth 200-200-20 MG/5ML suspension Commonly known as:  MAALOX/MYLANTA Take 30 mLs by mouth every 6 (six) hours as needed for indigestion or heartburn.   aspirin EC 81 MG tablet Take 81 mg by mouth daily.   atorvastatin 20 MG tablet Commonly known as:  LIPITOR Take 20 mg by mouth every evening.   azithromycin 250 MG tablet Commonly known as:  ZITHROMAX Z-PAK As directed   BIOFLAVONOIDS PO Take 2 tablets by mouth 2 (two) times daily.   cetirizine 10 MG tablet Commonly known as:  ZYRTEC Take 10 mg by mouth daily.   cromolyn 5.2 MG/ACT nasal spray Commonly known as:  NASALCROM Place 1 spray into both nostrils daily as needed for allergies.   dextromethorphan-guaiFENesin 30-600 MG 12hr tablet Commonly known as:  MUCINEX DM Take 1 tablet by mouth 2 (two) times daily.   docusate sodium 100 MG capsule Commonly known as:  COLACE Take 100 mg by mouth daily.   ENSURE Take 237 mLs by mouth daily.   folic acid 681 MCG tablet Commonly known as:  FOLVITE Take 400 mcg by mouth daily.   furosemide 40 MG tablet Commonly known as:  LASIX Take 40 mg by mouth daily.   GAS-X EXTRA STRENGTH 125 MG chewable tablet Generic drug:  simethicone Chew 125 mg by mouth every 6 (six) hours as needed for flatulence.   hydrALAZINE 25 MG tablet Commonly known as:  APRESOLINE Take 1 tablet (25 mg total) by mouth 3 (three) times daily. FOR BLOOD PRESSURE   hydrOXYzine 10 MG tablet Commonly known as:   ATARAX/VISTARIL Take 10 mg by mouth every 6 (six) hours as needed (itching from hydrocodone/apap).   ipratropium 0.03 % nasal spray Commonly known as:  ATROVENT Place 2 sprays into both nostrils 2 (two) times daily as needed for rhinitis.   levothyroxine 88 MCG tablet Commonly known as:  SYNTHROID, LEVOTHROID Take 88 mcg by mouth daily.   Magnesium 400 MG Tabs Take 400 mg by mouth daily.   meloxicam 7.5 MG tablet  Commonly known as:  MOBIC Take 1 tablet (7.5 mg total) by mouth daily.   methocarbamol 500 MG tablet Commonly known as:  ROBAXIN Take 1 tablet (500 mg total) by mouth every 8 (eight) hours as needed for muscle spasms.   metoCLOPramide 10 MG tablet Commonly known as:  REGLAN Take 10 mg by mouth 2 (two) times daily at 8 am and 10 pm.   metoprolol 100 MG tablet Commonly known as:  LOPRESSOR Take 100 mg by mouth 2 (two) times daily.   montelukast 10 MG tablet Commonly known as:  SINGULAIR Take 10 mg by mouth at bedtime.   omeprazole 40 MG capsule Commonly known as:  PRILOSEC Take 40 mg by mouth daily.   pioglitazone 30 MG tablet Commonly known as:  ACTOS Take 30 mg by mouth daily.   quinapril 40 MG tablet Commonly known as:  ACCUPRIL Take 40 mg by mouth daily.   sodium chloride 0.65 % Soln nasal spray Commonly known as:  OCEAN Place 1 spray into both nostrils as needed for congestion.   Vitamin D (Ergocalciferol) 50000 units Caps capsule Commonly known as:  DRISDOL Take 1 capsule (50,000 Units total) by mouth 2 (two) times a week. Tuesday and Thursday          Objective:    BP (!) 201/94   Pulse 68   Temp 97.6 F (36.4 C) (Oral)   Ht 5' 4.5" (1.638 m)   Wt 194 lb 12.8 oz (88.4 kg)   BMI 32.92 kg/m   Allergies  Allergen Reactions  . Other Itching, Nausea Only and Other (See Comments)    Acidic foods cause stomach upset and itching  . Allopurinol Rash  . Penicillins Rash    Name brand  . Sulfa Antibiotics Rash   Wt Readings from Last 3  Encounters:  04/13/16 194 lb 12.8 oz (88.4 kg)  02/01/16 201 lb (91.2 kg)  01/21/16 201 lb (91.2 kg)    Physical Exam  Constitutional: She is oriented to person, place, and time. She appears well-developed and well-nourished.  HENT:  Head: Normocephalic and atraumatic.  Right Ear: Tympanic membrane and external ear normal. No middle ear effusion.  Left Ear: Tympanic membrane and external ear normal.  No middle ear effusion.  Nose: Mucosal edema and rhinorrhea present. Right sinus exhibits no maxillary sinus tenderness. Left sinus exhibits no maxillary sinus tenderness.  Mouth/Throat: Uvula is midline. Posterior oropharyngeal erythema present.  Eyes: Conjunctivae and EOM are normal. Pupils are equal, round, and reactive to light. Right eye exhibits no discharge. Left eye exhibits no discharge.  Neck: Normal range of motion.  Cardiovascular: Normal rate, regular rhythm and normal heart sounds.   Pulmonary/Chest: Effort normal and breath sounds normal. No respiratory distress. She has no wheezes.  Abdominal: Soft.  Lymphadenopathy:    She has no cervical adenopathy.  Neurological: She is alert and oriented to person, place, and time.  Skin: Skin is warm and dry.  Psychiatric: She has a normal mood and affect.    Results for orders placed or performed in visit on 04/13/16  Bayer DCA Hb A1c Waived  Result Value Ref Range   Bayer DCA Hb A1c Waived 6.1 <7.0 %  CBC with Differential/Platelet  Result Value Ref Range   WBC 5.6 3.4 - 10.8 x10E3/uL   RBC 4.41 3.77 - 5.28 x10E6/uL   Hemoglobin 11.4 11.1 - 15.9 g/dL   Hematocrit 35.9 34.0 - 46.6 %   MCV 81 79 - 97 fL  MCH 25.9 (L) 26.6 - 33.0 pg   MCHC 31.8 31.5 - 35.7 g/dL   RDW 14.2 12.3 - 15.4 %   Platelets 209 150 - 379 x10E3/uL   Neutrophils 67 Not Estab. %   Lymphs 23 Not Estab. %   Monocytes 7 Not Estab. %   Eos 3 Not Estab. %   Basos 0 Not Estab. %   Neutrophils Absolute 3.8 1.4 - 7.0 x10E3/uL   Lymphocytes Absolute 1.3 0.7 -  3.1 x10E3/uL   Monocytes Absolute 0.4 0.1 - 0.9 x10E3/uL   EOS (ABSOLUTE) 0.2 0.0 - 0.4 x10E3/uL   Basophils Absolute 0.0 0.0 - 0.2 x10E3/uL   Immature Granulocytes 0 Not Estab. %   Immature Grans (Abs) 0.0 0.0 - 0.1 x10E3/uL  CMP14+EGFR  Result Value Ref Range   Glucose 110 (H) 65 - 99 mg/dL   BUN 20 8 - 27 mg/dL   Creatinine, Ser 0.96 0.57 - 1.00 mg/dL   GFR calc non Af Amer 63 >59 mL/min/1.73   GFR calc Af Amer 72 >59 mL/min/1.73   BUN/Creatinine Ratio 21 12 - 28   Sodium 143 134 - 144 mmol/L   Potassium 4.2 3.5 - 5.2 mmol/L   Chloride 102 96 - 106 mmol/L   CO2 26 18 - 29 mmol/L   Calcium 10.5 (H) 8.7 - 10.3 mg/dL   Total Protein 8.2 6.0 - 8.5 g/dL   Albumin 4.4 3.6 - 4.8 g/dL   Globulin, Total 3.8 1.5 - 4.5 g/dL   Albumin/Globulin Ratio 1.2 1.2 - 2.2   Bilirubin Total 0.2 0.0 - 1.2 mg/dL   Alkaline Phosphatase 147 (H) 39 - 117 IU/L   AST 18 0 - 40 IU/L   ALT 9 0 - 32 IU/L  Lipid panel  Result Value Ref Range   Cholesterol, Total 160 100 - 199 mg/dL   Triglycerides 102 0 - 149 mg/dL   HDL 43 >39 mg/dL   VLDL Cholesterol Cal 20 5 - 40 mg/dL   LDL Calculated 97 0 - 99 mg/dL   Chol/HDL Ratio 3.7 0.0 - 4.4 ratio units  TSH  Result Value Ref Range   TSH 1.090 0.450 - 4.500 uIU/mL      Assessment & Plan:   1. Essential hypertension - aspirin EC 81 MG tablet; Take 81 mg by mouth daily. - Bayer DCA Hb A1c Waived - CBC with Differential/Platelet - CMP14+EGFR - Lipid panel - TSH - Microalbumin / creatinine urine ratio - hydrALAZINE (APRESOLINE) 25 MG tablet; Take 1 tablet (25 mg total) by mouth 3 (three) times daily. FOR BLOOD PRESSURE  Dispense: 90 tablet; Refill: 6  2. Diabetes mellitus without complication ([place]) - Bayer DCA Hb A1c Waived  3. Primary osteoarthritis of right hip  4. Chronic kidney disease, unspecified CKD stage - Bayer DCA Hb A1c Waived - CBC with Differential/Platelet - CMP14+EGFR - Microalbumin / creatinine urine ratio  5. Body mass index  32.0-32.9, adult  6. Generalized OA - meloxicam (MOBIC) 7.5 MG tablet; Take 1 tablet (7.5 mg total) by mouth daily.  Dispense: 30 tablet; Refill: 6  7. Acute maxillary sinusitis, recurrence not specified - dextromethorphan-guaiFENesin (MUCINEX DM) 30-600 MG 12hr tablet; Take 1 tablet by mouth 2 (two) times daily. - azithromycin (ZITHROMAX Z-PAK) 250 MG tablet; As directed  Dispense: 6 tablet; Refill: 0 - methylPREDNISolone acetate (DEPO-MEDROL) injection 80 mg; Inject 1 mL (80 mg total) into the muscle once.  8. Weight gain - CBC with Differential/Platelet - CMP14+EGFR  9. Age-related osteoporosis  without current pathological fracture - DG Bone Density; Future  10. Encounter for immunization - atorvastatin (LIPITOR) 20 MG tablet; Take 20 mg by mouth every evening.; Refill: 4 - aspirin EC 81 MG tablet; Take 81 mg by mouth daily. - cetirizine (ZYRTEC) 10 MG tablet; Take 10 mg by mouth daily. - dextromethorphan-guaiFENesin (MUCINEX DM) 30-600 MG 12hr tablet; Take 1 tablet by mouth 2 (two) times daily. - Bayer DCA Hb A1c Waived - CBC with Differential/Platelet - CMP14+EGFR - Lipid panel - TSH - Microalbumin / creatinine urine ratio - hydrALAZINE (APRESOLINE) 25 MG tablet; Take 1 tablet (25 mg total) by mouth 3 (three) times daily. FOR BLOOD PRESSURE  Dispense: 90 tablet; Refill: 6 - azithromycin (ZITHROMAX Z-PAK) 250 MG tablet; As directed  Dispense: 6 tablet; Refill: 0 - meloxicam (MOBIC) 7.5 MG tablet; Take 1 tablet (7.5 mg total) by mouth daily.  Dispense: 30 tablet; Refill: 6 - methylPREDNISolone acetate (DEPO-MEDROL) injection 80 mg; Inject 1 mL (80 mg total) into the muscle once. - DG Bone Density; Future - Flu Vaccine QUAD 36+ mos IM   Continue all other maintenance medications as listed above. Educational handout given for sinusitis  Follow up plan: Return in about 4 weeks (around 05/11/2016) for BP recheck.  Terald Sleeper PA-C [facility] [address]  [place], [place] [address] [phone]   04/17/2016, 9:13 PM

## 2016-04-18 ENCOUNTER — Encounter: Payer: Self-pay | Admitting: Physician Assistant

## 2016-04-24 ENCOUNTER — Ambulatory Visit (INDEPENDENT_AMBULATORY_CARE_PROVIDER_SITE_OTHER): Payer: Medicare Other | Admitting: Physician Assistant

## 2016-04-24 ENCOUNTER — Encounter: Payer: Self-pay | Admitting: Physician Assistant

## 2016-04-24 VITALS — BP 149/87 | HR 77 | Temp 97.7°F | Ht 64.5 in | Wt 192.4 lb

## 2016-04-24 DIAGNOSIS — F411 Generalized anxiety disorder: Secondary | ICD-10-CM

## 2016-04-24 DIAGNOSIS — J209 Acute bronchitis, unspecified: Secondary | ICD-10-CM

## 2016-04-24 DIAGNOSIS — E119 Type 2 diabetes mellitus without complications: Secondary | ICD-10-CM

## 2016-04-24 DIAGNOSIS — I10 Essential (primary) hypertension: Secondary | ICD-10-CM | POA: Diagnosis not present

## 2016-04-24 DIAGNOSIS — R059 Cough, unspecified: Secondary | ICD-10-CM

## 2016-04-24 DIAGNOSIS — R05 Cough: Secondary | ICD-10-CM

## 2016-04-24 MED ORDER — ALPRAZOLAM 0.5 MG PO TABS: 0.5000 mg | ORAL_TABLET | Freq: Three times a day (TID) | ORAL | 2 refills | Status: DC

## 2016-04-24 MED ORDER — PIOGLITAZONE HCL 30 MG PO TABS: 30.0000 mg | ORAL_TABLET | Freq: Every day | ORAL | 6 refills | Status: DC

## 2016-04-24 NOTE — Patient Instructions (Signed)
Hypertension Hypertension, commonly called high blood pressure, is when the force of blood pumping through your arteries is too strong. Your arteries are the blood vessels that carry blood from your heart throughout your body. A blood pressure reading consists of a higher number over a lower number, such as 110/72. The higher number (systolic) is the pressure inside your arteries when your heart pumps. The lower number (diastolic) is the pressure inside your arteries when your heart relaxes. Ideally you want your blood pressure below 120/80. Hypertension forces your heart to work harder to pump blood. Your arteries may become narrow or stiff. Having untreated or uncontrolled hypertension can cause heart attack, stroke, kidney disease, and other problems. RISK FACTORS Some risk factors for high blood pressure are controllable. Others are not.  Risk factors you cannot control include:   Race. You may be at higher risk if you are African American.  Age. Risk increases with age.  Gender. Men are at higher risk than women before age 45 years. After age 65, women are at higher risk than men. Risk factors you can control include:  Not getting enough exercise or physical activity.  Being overweight.  Getting too much fat, sugar, calories, or salt in your diet.  Drinking too much alcohol. SIGNS AND SYMPTOMS Hypertension does not usually cause signs or symptoms. Extremely high blood pressure (hypertensive crisis) may cause headache, anxiety, shortness of breath, and nosebleed. DIAGNOSIS To check if you have hypertension, your health care provider will measure your blood pressure while you are seated, with your arm held at the level of your heart. It should be measured at least twice using the same arm. Certain conditions can cause a difference in blood pressure between your right and left arms. A blood pressure reading that is higher than normal on one occasion does not mean that you need treatment. If  it is not clear whether you have high blood pressure, you may be asked to return on a different day to have your blood pressure checked again. Or, you may be asked to monitor your blood pressure at home for 1 or more weeks. TREATMENT Treating high blood pressure includes making lifestyle changes and possibly taking medicine. Living a healthy lifestyle can help lower high blood pressure. You may need to change some of your habits. Lifestyle changes may include:  Following the DASH diet. This diet is high in fruits, vegetables, and whole grains. It is low in salt, red meat, and added sugars.  Keep your sodium intake below 2,300 mg per day.  Getting at least 30-45 minutes of aerobic exercise at least 4 times per week.  Losing weight if necessary.  Not smoking.  Limiting alcoholic beverages.  Learning ways to reduce stress. Your health care provider may prescribe medicine if lifestyle changes are not enough to get your blood pressure under control, and if one of the following is true:  You are 18-59 years of age and your systolic blood pressure is above 140.  You are 60 years of age or older, and your systolic blood pressure is above 150.  Your diastolic blood pressure is above 90.  You have diabetes, and your systolic blood pressure is over 140 or your diastolic blood pressure is over 90.  You have kidney disease and your blood pressure is above 140/90.  You have heart disease and your blood pressure is above 140/90. Your personal target blood pressure may vary depending on your medical conditions, your age, and other factors. HOME CARE INSTRUCTIONS    Have your blood pressure rechecked as directed by your health care provider.   Take medicines only as directed by your health care provider. Follow the directions carefully. Blood pressure medicines must be taken as prescribed. The medicine does not work as well when you skip doses. Skipping doses also puts you at risk for  problems.  Do not smoke.   Monitor your blood pressure at home as directed by your health care provider. SEEK MEDICAL CARE IF:   You think you are having a reaction to medicines taken.  You have recurrent headaches or feel dizzy.  You have swelling in your ankles.  You have trouble with your vision. SEEK IMMEDIATE MEDICAL CARE IF:  You develop a severe headache or confusion.  You have unusual weakness, numbness, or feel faint.  You have severe chest or abdominal pain.  You vomit repeatedly.  You have trouble breathing. MAKE SURE YOU:   Understand these instructions.  Will watch your condition.  Will get help right away if you are not doing well or get worse.   This information is not intended to replace advice given to you by your health care provider. Make sure you discuss any questions you have with your health care provider.   Document Released: 05/29/2005 Document Revised: 10/13/2014 Document Reviewed: 03/21/2013 Elsevier Interactive Patient Education 2016 Elsevier Inc.  

## 2016-04-24 NOTE — Progress Notes (Signed)
BP (!) 149/87   Pulse 77   Temp 97.7 F (36.5 C) (Oral)   Ht 5' 4.5" (1.638 m)   Wt 192 lb 6.4 oz (87.3 kg)   BMI 32.52 kg/m    Subjective:    Patient ID: [name], female    DOB: 1951/06/06, 65 y.o.   MRN: [id number]  HPI: [name] is a 65 y.o. female presenting on 04/24/2016 for Hypertension (Patient does not feel like the hydralazine is not working) The patient was confused with her medications. When she left last time she stopped taking her quinapril and clonidine. And was only taking hydralazine her blood pressure was not well-controlled doing this. So in the meantime she has done a variety of combinations of these medications. We have discussed the need for the quinapril for her kidneys. However due to the elevated blood pressure we are denied concentrate on using clonidine and hydralazine regularly. And have her reduce her quinapril dose to 20 mg 1 daily she is appointment in December and have asked her to keep that for follow-up. Her bronchitis is feeling much better she does have some cough and shortness of breath with long exertion. I've encouraged her to use her inhaler that she has at home regularly.   Relevant past medical, surgical, family and social history reviewed and updated as indicated. Allergies and medications reviewed and updated.  Past Medical History:  Diagnosis Date  . Allergic rhinitis   . Allergy   . Anemia   . Ankle swelling   . Anxiety    takes Xanax daily   . Arthritis   . Asthma   . Blurred vision, bilateral   . Chronic kidney disease   . Depression    takes Celexa daily  . Diabetes mellitus without complication ([place])    type II; takes Actos daily  . GERD (gastroesophageal reflux disease)   . Gout   . Heart murmur   . Heart palpitations   . Hemorrhoids   . History of blood transfusion    no abnormal reaction noted  . History of bronchitis   . Hyperlipidemia   . Hypertension    takes Catapres and Quinapril daily  .  Hypothyroidism    takes Synthroid daily  . Moderate mitral insufficiency    a. 12/2014 Echo: EF 55-60%, mod MR, mildly to moderately dil LA, PASP 53mHg.  . Osteoarthritis   . Paroxysmal supraventricular tachycardia ([place]   . Ringing in ears   . Shortness of breath dyspnea    ALbuterol daily as needed  . [place] disease ([place]   . Urinary incontinence     Past Surgical History:  Procedure Laterality Date  . ABDOMINAL HYSTERECTOMY    . COLON SURGERY    . COLONOSCOPY    . ESOPHAGOGASTRODUODENOSCOPY    . HERNIA REPAIR     hiatal hernia repair  . TOTAL HIP ARTHROPLASTY Left 02/02/2015   Procedure: LEFT TOTAL HIP ARTHROPLASTY;  Surgeon: PGarald Balding MD;  [facility]  Service: [place]opedics;  Laterality: Left;  . TOTAL HIP ARTHROPLASTY Right 02/01/2016   Procedure: TOTAL HIP ARTHROPLASTY;  Surgeon: PGarald Balding MD;  [facility]  Service: Orthopedics;  Laterality: Right;    Review of Systems  Constitutional: Negative.  Negative for activity change, fatigue and fever.  HENT: Negative.   Eyes: Negative.   Respiratory: Positive for shortness of breath and wheezing. Negative for cough.   Cardiovascular: Negative.  Negative for chest pain, palpitations and leg  swelling.  Gastrointestinal: Negative.  Negative for abdominal pain.  Endocrine: Negative.   Genitourinary: Negative.  Negative for dysuria.  Musculoskeletal: Negative.   Skin: Negative.   Neurological: Negative.       Medication List       Accurate as of 04/24/16 10:22 AM. Always use your most recent med list.          albuterol 108 (90 Base) MCG/ACT inhaler Commonly known as:  PROVENTIL HFA;VENTOLIN HFA Inhale 2 puffs into the lungs 2 (two) times daily as needed for wheezing or shortness of breath.   ALIVE ONCE DAILY WOMENS PO Take 1 tablet by mouth daily.   ALPRAZolam 0.5 MG tablet Commonly known as:  XANAX Take 1 tablet (0.5 mg total) by mouth 3 (three) times daily.   alum & mag  hydroxide-simeth 200-200-20 MG/5ML suspension Commonly known as:  MAALOX/MYLANTA Take 30 mLs by mouth every 6 (six) hours as needed for indigestion or heartburn.   aspirin EC 81 MG tablet Take 81 mg by mouth daily.   atorvastatin 20 MG tablet Commonly known as:  LIPITOR Take 20 mg by mouth every evening.   BIOFLAVONOIDS PO Take 2 tablets by mouth 2 (two) times daily.   cetirizine 10 MG tablet Commonly known as:  ZYRTEC Take 10 mg by mouth daily.   cloNIDine 0.2 MG tablet Commonly known as:  CATAPRES TAKE 1/2 TO 1 TABLET 2 TIMES A DAY   cromolyn 5.2 MG/ACT nasal spray Commonly known as:  NASALCROM Place 1 spray into both nostrils daily as needed for allergies.   dextromethorphan-guaiFENesin 30-600 MG 12hr tablet Commonly known as:  MUCINEX DM Take 1 tablet by mouth 2 (two) times daily.   docusate sodium 100 MG capsule Commonly known as:  COLACE Take 100 mg by mouth daily.   ENSURE Take 237 mLs by mouth daily.   folic acid 400 MCG tablet Commonly known as:  FOLVITE Take 400 mcg by mouth daily.   furosemide 40 MG tablet Commonly known as:  LASIX Take 40 mg by mouth daily.   GAS-X EXTRA STRENGTH 125 MG chewable tablet Generic drug:  simethicone Chew 125 mg by mouth every 6 (six) hours as needed for flatulence.   hydrALAZINE 25 MG tablet Commonly known as:  APRESOLINE Take 1 tablet (25 mg total) by mouth 3 (three) times daily. FOR BLOOD PRESSURE   hydrOXYzine 10 MG tablet Commonly known as:  ATARAX/VISTARIL Take 10 mg by mouth every 6 (six) hours as needed (itching from hydrocodone/apap).   ipratropium 0.03 % nasal spray Commonly known as:  ATROVENT Place 2 sprays into both nostrils 2 (two) times daily as needed for rhinitis.   levothyroxine 88 MCG tablet Commonly known as:  SYNTHROID, LEVOTHROID Take 88 mcg by mouth daily.   Magnesium 400 MG Tabs Take 400 mg by mouth daily.   meloxicam 7.5 MG tablet Commonly known as:  MOBIC Take 1 tablet (7.5 mg  total) by mouth daily.   metoprolol 100 MG tablet Commonly known as:  LOPRESSOR Take 100 mg by mouth 2 (two) times daily.   montelukast 10 MG tablet Commonly known as:  SINGULAIR Take 10 mg by mouth at bedtime.   omeprazole 40 MG capsule Commonly known as:  PRILOSEC Take 40 mg by mouth daily.   pioglitazone 30 MG tablet Commonly known as:  ACTOS Take 1 tablet (30 mg total) by mouth daily.   quinapril 40 MG tablet Commonly known as:  ACCUPRIL Take 20 mg by mouth daily. Take 1/2  tab daily   sodium chloride 0.65 % Soln nasal spray Commonly known as:  OCEAN Place 1 spray into both nostrils as needed for congestion.   Vitamin D (Ergocalciferol) 50000 units Caps capsule Commonly known as:  DRISDOL Take 1 capsule (50,000 Units total) by mouth 2 (two) times a week. Tuesday and Thursday          Objective:    BP (!) 149/87   Pulse 77   Temp 97.7 F (36.5 C) (Oral)   Ht 5' 4.5" (1.638 m)   Wt 192 lb 6.4 oz (87.3 kg)   BMI 32.52 kg/m   Allergies  Allergen Reactions  . Other Itching, Nausea Only and Other (See Comments)    Acidic foods cause stomach upset and itching  . Allopurinol Rash  . Penicillins Rash    Name brand  . Sulfa Antibiotics Rash    Physical Exam  Constitutional: She is oriented to person, place, and time. She appears well-developed and well-nourished.  HENT:  Head: Normocephalic and atraumatic.  Eyes: Conjunctivae and EOM are normal. Pupils are equal, round, and reactive to light.  Cardiovascular: Normal rate, regular rhythm, normal heart sounds and intact distal pulses.   Pulmonary/Chest: Effort normal and breath sounds normal.  Abdominal: Soft. Bowel sounds are normal.  Neurological: She is alert and oriented to person, place, and time. She has normal reflexes.  Skin: Skin is warm and dry. No rash noted.  Psychiatric: She has a normal mood and affect. Her behavior is normal. Judgment and thought content normal.    Results for orders placed or  performed in visit on 04/13/16  Bayer DCA Hb A1c Waived  Result Value Ref Range   Bayer DCA Hb A1c Waived 6.1 <7.0 %  CBC with Differential/Platelet  Result Value Ref Range   WBC 5.6 3.4 - 10.8 x10E3/uL   RBC 4.41 3.77 - 5.28 x10E6/uL   Hemoglobin 11.4 11.1 - 15.9 g/dL   Hematocrit 35.9 34.0 - 46.6 %   MCV 81 79 - 97 fL   MCH 25.9 (L) 26.6 - 33.0 pg   MCHC 31.8 31.5 - 35.7 g/dL   RDW 14.2 12.3 - 15.4 %   Platelets 209 150 - 379 x10E3/uL   Neutrophils 67 Not Estab. %   Lymphs 23 Not Estab. %   Monocytes 7 Not Estab. %   Eos 3 Not Estab. %   Basos 0 Not Estab. %   Neutrophils Absolute 3.8 1.4 - 7.0 x10E3/uL   Lymphocytes Absolute 1.3 0.7 - 3.1 x10E3/uL   Monocytes Absolute 0.4 0.1 - 0.9 x10E3/uL   EOS (ABSOLUTE) 0.2 0.0 - 0.4 x10E3/uL   Basophils Absolute 0.0 0.0 - 0.2 x10E3/uL   Immature Granulocytes 0 Not Estab. %   Immature Grans (Abs) 0.0 0.0 - 0.1 x10E3/uL  CMP14+EGFR  Result Value Ref Range   Glucose 110 (H) 65 - 99 mg/dL   BUN 20 8 - 27 mg/dL   Creatinine, Ser 0.96 0.57 - 1.00 mg/dL   GFR calc non Af Amer 63 >59 mL/min/1.73   GFR calc Af Amer 72 >59 mL/min/1.73   BUN/Creatinine Ratio 21 12 - 28   Sodium 143 134 - 144 mmol/L   Potassium 4.2 3.5 - 5.2 mmol/L   Chloride 102 96 - 106 mmol/L   CO2 26 18 - 29 mmol/L   Calcium 10.5 (H) 8.7 - 10.3 mg/dL   Total Protein 8.2 6.0 - 8.5 g/dL   Albumin 4.4 3.6 - 4.8 g/dL   Globulin,  Total 3.8 1.5 - 4.5 g/dL   Albumin/Globulin Ratio 1.2 1.2 - 2.2   Bilirubin Total 0.2 0.0 - 1.2 mg/dL   Alkaline Phosphatase 147 (H) 39 - 117 IU/L   AST 18 0 - 40 IU/L   ALT 9 0 - 32 IU/L  Lipid panel  Result Value Ref Range   Cholesterol, Total 160 100 - 199 mg/dL   Triglycerides 102 0 - 149 mg/dL   HDL 43 >39 mg/dL   VLDL Cholesterol Cal 20 5 - 40 mg/dL   LDL Calculated 97 0 - 99 mg/dL   Chol/HDL Ratio 3.7 0.0 - 4.4 ratio units  TSH  Result Value Ref Range   TSH 1.090 0.450 - 4.500 uIU/mL      Assessment & Plan:   1. Essential  hypertension - cloNIDine (CATAPRES) 0.2 MG tablet; TAKE 1/2 TO 1 TABLET 2 TIMES A DAY; Refill: 6 -HYDRALAZINE '25mg'$  one TID for BP  2. Cough  3. Acute bronchitis, unspecified organism  4. Diabetes mellitus without complication ([facility]) - pioglitazone (ACTOS) 30 MG tablet; Take 1 tablet (30 mg total) by mouth daily.  Dispense: 30 tablet; Refill: 6  5. Generalized anxiety disorder - ALPRAZolam (XANAX) 0.5 MG tablet; Take 1 tablet (0.5 mg total) by mouth 3 (three) times daily.  Dispense: 90 tablet; Refill: 2  Continue all other maintenance medications as listed above.  Follow up plan: Return for keep December appointment.  Educational handout given for hypertension   Terald Sleeper PA-C [facility] [address]  [place], [place] [address] [phone]   04/24/2016, 10:22 AM

## 2016-04-25 ENCOUNTER — Ambulatory Visit: Payer: Medicare Other | Admitting: Physician Assistant

## 2016-04-28 ENCOUNTER — Other Ambulatory Visit: Payer: Self-pay | Admitting: Physician Assistant

## 2016-04-28 DIAGNOSIS — I10 Essential (primary) hypertension: Secondary | ICD-10-CM

## 2016-05-09 ENCOUNTER — Other Ambulatory Visit: Payer: Self-pay | Admitting: Physician Assistant

## 2016-05-15 ENCOUNTER — Ambulatory Visit (INDEPENDENT_AMBULATORY_CARE_PROVIDER_SITE_OTHER): Payer: Medicare Other | Admitting: Physician Assistant

## 2016-05-15 ENCOUNTER — Encounter: Payer: Self-pay | Admitting: Physician Assistant

## 2016-05-15 VITALS — BP 121/71 | HR 77 | Temp 97.3°F | Ht 64.5 in | Wt 198.0 lb

## 2016-05-15 DIAGNOSIS — I10 Essential (primary) hypertension: Secondary | ICD-10-CM

## 2016-05-15 DIAGNOSIS — J4520 Mild intermittent asthma, uncomplicated: Secondary | ICD-10-CM | POA: Diagnosis not present

## 2016-05-15 MED ORDER — ALBUTEROL SULFATE HFA 108 (90 BASE) MCG/ACT IN AERS: 2.0000 | INHALATION_SPRAY | Freq: Two times a day (BID) | RESPIRATORY_TRACT | 6 refills | Status: DC | PRN

## 2016-05-15 NOTE — Progress Notes (Signed)
BP 121/71 (BP Location: Left Arm, Patient Position: Sitting, Cuff Size: Normal)   Pulse 77   Temp 97.3 F (36.3 C) (Oral)   Ht 5' 4.5" (1.638 m)   Wt 198 lb (89.8 kg)   BMI 33.46 kg/m    Subjective:    Patient ID: [name], female    DOB: 11/16/1950, 65 y.o.   MRN: [id number]  HPI: [name] is a 65 y.o. female presenting on 05/15/2016 for No chief complaint on file.  Patient comes in for a recheck on her blood pressure today. She is tolerating the medications very well. She is having blood pressure readings anywhere from the [id number] to [id number] systolically, [id number] diastolically. She has been on to tolerate the medication very well. I've encouraged her to continue to take it as we have been. She is not had needed for albuterol in some time. She has been well controlled on Symbicort. She does not have a current prescription for albuterol we will refill that today.  Relevant past medical, surgical, family and social history reviewed and updated as indicated. Allergies and medications reviewed and updated.  Past Medical History:  Diagnosis Date  . Allergic rhinitis   . Allergy   . Anemia   . Ankle swelling   . Anxiety    takes Xanax daily   . Arthritis   . Asthma   . Blurred vision, bilateral   . Chronic kidney disease   . Depression    takes Celexa daily  . Diabetes mellitus without complication ([place])    type II; takes Actos daily  . GERD (gastroesophageal reflux disease)   . Gout   . Heart murmur   . Heart palpitations   . Hemorrhoids   . History of blood transfusion    no abnormal reaction noted  . History of bronchitis   . Hyperlipidemia   . Hypertension    takes Catapres and Quinapril daily  . Hypothyroidism    takes Synthroid daily  . Moderate mitral insufficiency    a. 12/2014 Echo: EF 55-60%, mod MR, mildly to moderately dil LA, PASP 55mmHg.  . Osteoarthritis   . Paroxysmal supraventricular tachycardia ([place])   . Ringing in ears   . Shortness of breath dyspnea     ALbuterol daily as needed  . Stevens-Johnson disease ([place])   . Urinary incontinence     Past Surgical History:  Procedure Laterality Date  . ABDOMINAL HYSTERECTOMY    . COLON SURGERY    . COLONOSCOPY    . ESOPHAGOGASTRODUODENOSCOPY    . HERNIA REPAIR     hiatal hernia repair  . TOTAL HIP ARTHROPLASTY Left 02/02/2015   Procedure: LEFT TOTAL HIP ARTHROPLASTY;  Surgeon: Garald Balding, MD;  Location: [facility];  Service: Orthopedics;  Laterality: Left;  . TOTAL HIP ARTHROPLASTY Right 02/01/2016   Procedure: TOTAL HIP ARTHROPLASTY;  Surgeon: Garald Balding, MD;  Location: [facility];  Service: Orthopedics;  Laterality: Right;    Review of Systems  Constitutional: Negative.  Negative for activity change, fatigue and fever.  HENT: Negative.   Eyes: Negative.   Respiratory: Negative.  Negative for cough.   Cardiovascular: Negative.  Negative for chest pain.  Gastrointestinal: Negative.  Negative for abdominal pain.  Endocrine: Negative.   Genitourinary: Negative.  Negative for dysuria.  Musculoskeletal: Negative.   Skin: Negative.   Neurological: Negative.       Medication List       Accurate as of 05/15/16  2:04 PM. Always  use your most recent med list.          albuterol 108 (90 Base) MCG/ACT inhaler Commonly known as:  PROVENTIL HFA;VENTOLIN HFA Inhale 2 puffs into the lungs 2 (two) times daily as needed for wheezing or shortness of breath.   ALIVE ONCE DAILY WOMENS PO Take 1 tablet by mouth daily.   ALPRAZolam 0.5 MG tablet Commonly known as:  XANAX Take 1 tablet (0.5 mg total) by mouth 3 (three) times daily.   alum & mag hydroxide-simeth 200-200-20 MG/5ML suspension Commonly known as:  MAALOX/MYLANTA Take 30 mLs by mouth every 6 (six) hours as needed for indigestion or heartburn.   aspirin EC 81 MG tablet Take 81 mg by mouth daily.   atorvastatin 20 MG tablet Commonly known as:  LIPITOR Take 20 mg by mouth every evening.   BIOFLAVONOIDS PO Take 2 tablets by  mouth 2 (two) times daily.   cetirizine 10 MG tablet Commonly known as:  ZYRTEC Take 10 mg by mouth daily.   cloNIDine 0.2 MG tablet Commonly known as:  CATAPRES TAKE 1/2 TO 1 TABLET 2 TIMES A DAY   cromolyn 5.2 MG/ACT nasal spray Commonly known as:  NASALCROM Place 1 spray into both nostrils daily as needed for allergies.   dextromethorphan-guaiFENesin 30-600 MG 12hr tablet Commonly known as:  MUCINEX DM Take 1 tablet by mouth 2 (two) times daily.   docusate sodium 100 MG capsule Commonly known as:  COLACE Take 100 mg by mouth daily.   folic acid Q000111Q MCG tablet Commonly known as:  FOLVITE Take 400 mcg by mouth daily.   furosemide 40 MG tablet Commonly known as:  LASIX Take 40 mg by mouth daily.   GAS-X EXTRA STRENGTH 125 MG chewable tablet Generic drug:  simethicone Chew 125 mg by mouth every 6 (six) hours as needed for flatulence.   hydrALAZINE 25 MG tablet Commonly known as:  APRESOLINE Take 1 tablet (25 mg total) by mouth 3 (three) times daily. FOR BLOOD PRESSURE   hydrOXYzine 10 MG tablet Commonly known as:  ATARAX/VISTARIL Take 10 mg by mouth every 6 (six) hours as needed (itching from hydrocodone/apap).   ipratropium 0.03 % nasal spray Commonly known as:  ATROVENT Place 2 sprays into both nostrils 2 (two) times daily as needed for rhinitis.   levothyroxine 88 MCG tablet Commonly known as:  SYNTHROID, LEVOTHROID TAKE 1 TABLET BY MOUTH EVERY DAY   Magnesium 400 MG Tabs Take 400 mg by mouth daily.   meloxicam 7.5 MG tablet Commonly known as:  MOBIC Take 1 tablet (7.5 mg total) by mouth daily.   montelukast 10 MG tablet Commonly known as:  SINGULAIR Take 10 mg by mouth at bedtime.   omeprazole 40 MG capsule Commonly known as:  PRILOSEC Take 40 mg by mouth daily.   pioglitazone 30 MG tablet Commonly known as:  ACTOS Take 1 tablet (30 mg total) by mouth daily.   quinapril 40 MG tablet Commonly known as:  ACCUPRIL Take 20 mg by mouth 2 (two)  times daily. Take 1/2 tab daily   sodium chloride 0.65 % Soln nasal spray Commonly known as:  OCEAN Place 1 spray into both nostrils as needed for congestion.   Vitamin D (Ergocalciferol) 50000 units Caps capsule Commonly known as:  DRISDOL Take 1 capsule (50,000 Units total) by mouth 2 (two) times a week. Tuesday and Thursday          Objective:    BP 121/71 (BP Location: Left Arm, Patient Position:  Sitting, Cuff Size: Normal)   Pulse 77   Temp 97.3 F (36.3 C) (Oral)   Ht 5' 4.5" (1.638 m)   Wt 198 lb (89.8 kg)   BMI 33.46 kg/m   Allergies  Allergen Reactions  . Other Itching, Nausea Only and Other (See Comments)    Acidic foods cause stomach upset and itching  . Allopurinol Rash  . Penicillins Rash    Name brand  . Sulfa Antibiotics Rash    Physical Exam  Constitutional: She is oriented to person, place, and time. She appears well-developed and well-nourished.  HENT:  Head: Normocephalic and atraumatic.  Eyes: Conjunctivae and EOM are normal. Pupils are equal, round, and reactive to light.  Cardiovascular: Normal rate, regular rhythm, normal heart sounds and intact distal pulses.   Pulmonary/Chest: Effort normal and breath sounds normal.  Abdominal: Soft. Bowel sounds are normal.  Neurological: She is alert and oriented to person, place, and time. She has normal reflexes.  Skin: Skin is warm and dry. No rash noted.  Psychiatric: She has a normal mood and affect. Her behavior is normal. Judgment and thought content normal.  Nursing note and vitals reviewed.       Assessment & Plan:   1. Essential hypertension Clonidine 0.2 mg BID Quinapril 20 mg BID Hydralazine 25 mg TID  2. Mild intermittent asthma, unspecified whether complicated - albuterol (PROVENTIL HFA;VENTOLIN HFA) 108 (90 Base) MCG/ACT inhaler; Inhale 2 puffs into the lungs 2 (two) times daily as needed for wheezing or shortness of breath.  Dispense: 18 g; Refill: 6   Continue all other  maintenance medications as listed above.  Follow up plan: Return in about 3 months (around 08/13/2016) for recheck BP.  No orders of the defined types were placed in this encounter.   Educational handout given for DASH eating  Terald Sleeper PA-C [facility] [address]  [place], [place] [address] [phone]   05/15/2016, 2:04 PM

## 2016-05-15 NOTE — Patient Instructions (Signed)
DASH Eating Plan DASH stands for "Dietary Approaches to Stop Hypertension." The DASH eating plan is a healthy eating plan that has been shown to reduce high blood pressure (hypertension). Additional health benefits may include reducing the risk of type 2 diabetes mellitus, heart disease, and stroke. The DASH eating plan may also help with weight loss. What do I need to know about the DASH eating plan? For the DASH eating plan, you will follow these general guidelines:  Choose foods with less than 150 milligrams of sodium per serving (as listed on the food label).  Use salt-free seasonings or herbs instead of table salt or sea salt.  Check with your health care provider or pharmacist before using salt substitutes.  Eat lower-sodium products. These are often labeled as "low-sodium" or "no salt added."  Eat fresh foods. Avoid eating a lot of canned foods.  Eat more vegetables, fruits, and low-fat dairy products.  Choose whole grains. Look for the word "whole" as the first word in the ingredient list.  Choose fish and skinless chicken or turkey more often than red meat. Limit fish, poultry, and meat to 6 oz (170 g) each day.  Limit sweets, desserts, sugars, and sugary drinks.  Choose heart-healthy fats.  Eat more home-cooked food and less restaurant, buffet, and fast food.  Limit fried foods.  Do not fry foods. Cook foods using methods such as baking, boiling, grilling, and broiling instead.  When eating at a restaurant, ask that your food be prepared with less salt, or no salt if possible. What foods can I eat? Seek help from a dietitian for individual calorie needs. Grains  Whole grain or whole wheat bread. Brown rice. Whole grain or whole wheat pasta. Quinoa, bulgur, and whole grain cereals. Low-sodium cereals. Corn or whole wheat flour tortillas. Whole grain cornbread. Whole grain crackers. Low-sodium crackers. Vegetables  Fresh or frozen vegetables (raw, steamed, roasted, or  grilled). Low-sodium or reduced-sodium tomato and vegetable juices. Low-sodium or reduced-sodium tomato sauce and paste. Low-sodium or reduced-sodium canned vegetables. Fruits  All fresh, canned (in natural juice), or frozen fruits. Meat and Other Protein Products  Ground beef (85% or leaner), grass-fed beef, or beef trimmed of fat. Skinless chicken or turkey. Ground chicken or turkey. Pork trimmed of fat. All fish and seafood. Eggs. Dried beans, peas, or lentils. Unsalted nuts and seeds. Unsalted canned beans. Dairy  Low-fat dairy products, such as skim or 1% milk, 2% or reduced-fat cheeses, low-fat ricotta or cottage cheese, or plain low-fat yogurt. Low-sodium or reduced-sodium cheeses. Fats and Oils  Tub margarines without trans fats. Light or reduced-fat mayonnaise and salad dressings (reduced sodium). Avocado. Safflower, olive, or canola oils. Natural peanut or almond butter. Other  Unsalted popcorn and pretzels. The items listed above may not be a complete list of recommended foods or beverages. Contact your dietitian for more options.  What foods are not recommended? Grains  White bread. White pasta. White rice. Refined cornbread. Bagels and croissants. Crackers that contain trans fat. Vegetables  Creamed or fried vegetables. Vegetables in a cheese sauce. Regular canned vegetables. Regular canned tomato sauce and paste. Regular tomato and vegetable juices. Fruits  Canned fruit in light or heavy syrup. Fruit juice. Meat and Other Protein Products  Fatty cuts of meat. Ribs, chicken wings, bacon, sausage, bologna, salami, chitterlings, fatback, hot dogs, bratwurst, and packaged luncheon meats. Salted nuts and seeds. Canned beans with salt. Dairy  Whole or 2% milk, cream, half-and-half, and cream cheese. Whole-fat or sweetened yogurt. Full-fat cheeses   or blue cheese. Nondairy creamers and whipped toppings. Processed cheese, cheese spreads, or cheese curds. Condiments  Onion and garlic  salt, seasoned salt, table salt, and sea salt. Canned and packaged gravies. Worcestershire sauce. Tartar sauce. Barbecue sauce. Teriyaki sauce. Soy sauce, including reduced sodium. Steak sauce. Fish sauce. Oyster sauce. Cocktail sauce. Horseradish. Ketchup and mustard. Meat flavorings and tenderizers. Bouillon cubes. Hot sauce. Tabasco sauce. Marinades. Taco seasonings. Relishes. Fats and Oils  Butter, stick margarine, lard, shortening, ghee, and bacon fat. Coconut, palm kernel, or palm oils. Regular salad dressings. Other  Pickles and olives. Salted popcorn and pretzels. The items listed above may not be a complete list of foods and beverages to avoid. Contact your dietitian for more information.  Where can I find more information? National Heart, [employer], [employer] Institute: www.nhlbi.nih.gov/health/health-topics/topics/dash/ This information is not intended to replace advice given to you by your health care provider. Make sure you discuss any questions you have with your health care provider. Document Released: 05/18/2011 Document Revised: 11/04/2015 Document Reviewed: 04/02/2013 Elsevier Interactive Patient Education  2017 Elsevier Inc.  

## 2016-06-07 ENCOUNTER — Other Ambulatory Visit: Payer: Self-pay | Admitting: Physician Assistant

## 2016-08-10 ENCOUNTER — Telehealth: Payer: Self-pay | Admitting: Physician Assistant

## 2016-08-10 MED ORDER — QUINAPRIL HCL 40 MG PO TABS: ORAL_TABLET | ORAL | 2 refills | Status: DC

## 2016-08-10 NOTE — Telephone Encounter (Signed)
What is the name of the medication? Quinapril 40 mg  Have you contacted your pharmacy to request a refill? [phone]  Which pharmacy would you like this sent to? [facility] in [place]   Patient notified that their request is being sent to the clinical staff for review and that they should receive a call once it is complete. If they do not receive a call within 24 hours they can check with their pharmacy or our office.

## 2016-08-10 NOTE — Telephone Encounter (Signed)
done

## 2016-08-15 ENCOUNTER — Ambulatory Visit: Payer: Medicare Other | Admitting: Physician Assistant

## 2016-08-16 ENCOUNTER — Ambulatory Visit (INDEPENDENT_AMBULATORY_CARE_PROVIDER_SITE_OTHER): Payer: Medicare Other | Admitting: Physician Assistant

## 2016-08-16 ENCOUNTER — Encounter: Payer: Self-pay | Admitting: Physician Assistant

## 2016-08-16 DIAGNOSIS — I10 Essential (primary) hypertension: Secondary | ICD-10-CM | POA: Diagnosis not present

## 2016-08-16 DIAGNOSIS — F411 Generalized anxiety disorder: Secondary | ICD-10-CM | POA: Diagnosis not present

## 2016-08-16 MED ORDER — CLONIDINE HCL 0.2 MG PO TABS: 0.2000 mg | ORAL_TABLET | Freq: Two times a day (BID) | ORAL | 11 refills | Status: DC

## 2016-08-16 MED ORDER — QUINAPRIL HCL 40 MG PO TABS: 40.0000 mg | ORAL_TABLET | Freq: Every day | ORAL | 11 refills | Status: DC

## 2016-08-16 MED ORDER — HYDRALAZINE HCL 25 MG PO TABS: 25.0000 mg | ORAL_TABLET | Freq: Three times a day (TID) | ORAL | 11 refills | Status: DC

## 2016-08-16 MED ORDER — CITALOPRAM HYDROBROMIDE 20 MG PO TABS: 40.0000 mg | ORAL_TABLET | Freq: Every day | ORAL | 11 refills | Status: DC

## 2016-08-16 MED ORDER — ALPRAZOLAM 0.5 MG PO TABS: 0.5000 mg | ORAL_TABLET | Freq: Three times a day (TID) | ORAL | 5 refills | Status: DC

## 2016-08-16 NOTE — Patient Instructions (Signed)
Hypertension °Hypertension is another name for high blood pressure. High blood pressure forces your heart to work harder to pump blood. This can cause problems over time. °There are two numbers in a blood pressure reading. There is a top number (systolic) over a bottom number (diastolic). It is best to have a blood pressure below 120/80. Healthy choices can help lower your blood pressure. You may need medicine to help lower your blood pressure if: °· Your blood pressure cannot be lowered with healthy choices. °· Your blood pressure is higher than 130/80. °Follow these instructions at home: °Eating and drinking  °· If directed, follow the DASH eating plan. This diet includes: °¨ Filling half of your plate at each meal with fruits and vegetables. °¨ Filling one quarter of your plate at each meal with whole grains. Whole grains include whole wheat pasta, brown rice, and whole grain bread. °¨ Eating or drinking low-fat dairy products, such as skim milk or low-fat yogurt. °¨ Filling one quarter of your plate at each meal with low-fat (lean) proteins. Low-fat proteins include fish, skinless chicken, eggs, beans, and tofu. °¨ Avoiding fatty meat, cured and processed meat, or chicken with skin. °¨ Avoiding premade or processed food. °· Eat less than 1,500 mg of salt (sodium) a day. °· Limit alcohol use to no more than 1 drink a day for nonpregnant women and 2 drinks a day for men. One drink equals 12 oz of beer, 5 oz of wine, or 1½ oz of hard liquor. °Lifestyle  °· Work with your doctor to stay at a healthy weight or to lose weight. Ask your doctor what the best weight is for you. °· Get at least 30 minutes of exercise that causes your heart to beat faster (aerobic exercise) most days of the week. This may include walking, swimming, or biking. °· Get at least 30 minutes of exercise that strengthens your muscles (resistance exercise) at least 3 days a week. This may include lifting weights or pilates. °· Do not use any  products that contain nicotine or tobacco. This includes cigarettes and e-cigarettes. If you need help quitting, ask your doctor. °· Check your blood pressure at home as told by your doctor. °· Keep all follow-up visits as told by your doctor. This is important. °Medicines  °· Take over-the-counter and prescription medicines only as told by your doctor. Follow directions carefully. °· Do not skip doses of blood pressure medicine. The medicine does not work as well if you skip doses. Skipping doses also puts you at risk for problems. °· Ask your doctor about side effects or reactions to medicines that you should watch for. °Contact a doctor if: °· You think you are having a reaction to the medicine you are taking. °· You have headaches that keep coming back (recurring). °· You feel dizzy. °· You have swelling in your ankles. °· You have trouble with your vision. °Get help right away if: °· You get a very bad headache. °· You start to feel confused. °· You feel weak or numb. °· You feel faint. °· You get very bad pain in your: °¨ Chest. °¨ Belly (abdomen). °· You throw up (vomit) more than once. °· You have trouble breathing. °Summary °· Hypertension is another name for high blood pressure. °· Making healthy choices can help lower blood pressure. If your blood pressure cannot be controlled with healthy choices, you may need to take medicine. °This information is not intended to replace advice given to you by your   health care provider. Make sure you discuss any questions you have with your health care provider. °Document Released: 11/15/2007 Document Revised: 04/26/2016 Document Reviewed: 04/26/2016 °Elsevier Interactive Patient Education © 2017 Elsevier Inc. ° °

## 2016-08-18 NOTE — Progress Notes (Signed)
BP 133/82   Pulse 93   Temp 98 F (36.7 C) (Oral)   Ht 5' 4.5" (1.638 m)   Wt 193 lb 12.8 oz (87.9 kg)   BMI 32.75 kg/m    Subjective:    Patient ID: [name], female    DOB: August 06, 1950, 66 y.o.   MRN: [id number]  HPI: [name] is a 66 y.o. female presenting on 08/16/2016 for Follow-up and Hypertension  This patient comes in for periodic recheck on medications and conditions including hypertension and chronic generalized anxiety.   All medications are reviewed today. There are no reports of any problems with the medications. All of the medical conditions are reviewed and updated.  Lab work is reviewed and will be ordered as medically necessary. There are no new problems reported with today's visit.   Relevant past medical, surgical, family and social history reviewed and updated as indicated. Allergies and medications reviewed and updated.  Past Medical History:  Diagnosis Date  . Allergic rhinitis   . Allergy   . Anemia   . Ankle swelling   . Anxiety    takes Xanax daily   . Arthritis   . Asthma   . Blurred vision, bilateral   . Chronic kidney disease   . Depression    takes Celexa daily  . Diabetes mellitus without complication ([facility])    type II; takes Actos daily  . GERD (gastroesophageal reflux disease)   . Gout   . Heart murmur   . Heart palpitations   . Hemorrhoids   . History of blood transfusion    no abnormal reaction noted  . History of bronchitis   . Hyperlipidemia   . Hypertension    takes Catapres and Quinapril daily  . Hypothyroidism    takes Synthroid daily  . Moderate mitral insufficiency    a. 12/2014 Echo: EF 55-60%, mod MR, mildly to moderately dil LA, PASP 8mHg.  . Osteoarthritis   . Paroxysmal supraventricular tachycardia ([facility]   . Ringing in ears   . Shortness of breath dyspnea    ALbuterol daily as needed  . Stevens-Johnson disease ([facility]   . Urinary incontinence     Past Surgical History:  Procedure Laterality Date  .  ABDOMINAL HYSTERECTOMY    . COLON SURGERY    . COLONOSCOPY    . ESOPHAGOGASTRODUODENOSCOPY    . HERNIA REPAIR     hiatal hernia repair  . TOTAL HIP ARTHROPLASTY Left 02/02/2015   Procedure: LEFT TOTAL HIP ARTHROPLASTY;  Surgeon: PGarald Balding MD;  [facility]  Service: Orthopedics;  Laterality: Left;  . TOTAL HIP ARTHROPLASTY Right 02/01/2016   Procedure: TOTAL HIP ARTHROPLASTY;  Surgeon: PGarald Balding MD;  [facility]  Service: Orthopedics;  Laterality: Right;    Review of Systems  Constitutional: Negative.  Negative for activity change, fatigue and fever.  HENT: Negative.   Eyes: Negative.   Respiratory: Negative.  Negative for cough.   Cardiovascular: Negative.  Negative for chest pain.  Gastrointestinal: Negative.  Negative for abdominal pain.  Endocrine: Negative.   Genitourinary: Negative.  Negative for dysuria.  Musculoskeletal: Negative.   Skin: Negative.   Neurological: Negative.     Allergies as of 08/16/2016      Reactions   Other Itching, Nausea Only, Other (See Comments)   Acidic foods cause stomach upset and itching   Allopurinol Rash   Penicillins Rash   Name brand   Sulfa Antibiotics Rash  Medication List       Accurate as of 08/16/16 11:59 PM. Always use your most recent med list.          albuterol 108 (90 Base) MCG/ACT inhaler Commonly known as:  PROVENTIL HFA;VENTOLIN HFA Inhale 2 puffs into the lungs 2 (two) times daily as needed for wheezing or shortness of breath.   ALIVE ONCE DAILY WOMENS PO Take 1 tablet by mouth daily.   ALPRAZolam 0.5 MG tablet Commonly known as:  XANAX Take 1 tablet (0.5 mg total) by mouth 3 (three) times daily.   alum & mag hydroxide-simeth 200-200-20 MG/5ML suspension Commonly known as:  MAALOX/MYLANTA Take 30 mLs by mouth every 6 (six) hours as needed for indigestion or heartburn.   aspirin EC 81 MG tablet Take 81 mg by mouth daily.   atorvastatin 20 MG tablet Commonly known as:   LIPITOR Take 20 mg by mouth every evening.   BIOFLAVONOIDS PO Take 2 tablets by mouth 2 (two) times daily.   cetirizine 10 MG tablet Commonly known as:  ZYRTEC Take 10 mg by mouth daily.   citalopram 20 MG tablet Commonly known as:  CELEXA Take 2 tablets (40 mg total) by mouth daily.   cloNIDine 0.2 MG tablet Commonly known as:  CATAPRES Take 1 tablet (0.2 mg total) by mouth 2 (two) times daily.   cromolyn 5.2 MG/ACT nasal spray Commonly known as:  NASALCROM Place 1 spray into both nostrils daily as needed for allergies.   dextromethorphan-guaiFENesin 30-600 MG 12hr tablet Commonly known as:  MUCINEX DM Take 1 tablet by mouth 2 (two) times daily.   docusate sodium 100 MG capsule Commonly known as:  COLACE Take 100 mg by mouth daily.   folic acid 623 MCG tablet Commonly known as:  FOLVITE Take 400 mcg by mouth daily.   furosemide 40 MG tablet Commonly known as:  LASIX Take 40 mg by mouth daily.   GAS-X EXTRA STRENGTH 125 MG chewable tablet Generic drug:  simethicone Chew 125 mg by mouth every 6 (six) hours as needed for flatulence.   hydrALAZINE 25 MG tablet Commonly known as:  APRESOLINE Take 1 tablet (25 mg total) by mouth 3 (three) times daily. FOR BLOOD PRESSURE   hydrOXYzine 10 MG tablet Commonly known as:  ATARAX/VISTARIL Take 10 mg by mouth every 6 (six) hours as needed (itching from hydrocodone/apap).   ipratropium 0.03 % nasal spray Commonly known as:  ATROVENT Place 2 sprays into both nostrils 2 (two) times daily as needed for rhinitis.   levothyroxine 88 MCG tablet Commonly known as:  SYNTHROID, LEVOTHROID TAKE 1 TABLET BY MOUTH EVERY DAY   Magnesium 400 MG Tabs Take 400 mg by mouth daily.   meloxicam 7.5 MG tablet Commonly known as:  MOBIC Take 1 tablet (7.5 mg total) by mouth daily.   montelukast 10 MG tablet Commonly known as:  SINGULAIR Take 10 mg by mouth at bedtime.   omeprazole 40 MG capsule Commonly known as:  PRILOSEC Take 40  mg by mouth daily.   pioglitazone 30 MG tablet Commonly known as:  ACTOS Take 1 tablet (30 mg total) by mouth daily.   quinapril 40 MG tablet Commonly known as:  ACCUPRIL Take 1 tablet (40 mg total) by mouth daily.   sodium chloride 0.65 % Soln nasal spray Commonly known as:  OCEAN Place 1 spray into both nostrils as needed for congestion.   Vitamin D (Ergocalciferol) 50000 units Caps capsule Commonly known as:  DRISDOL Take 1 capsule (  50,000 Units total) by mouth 2 (two) times a week. Tuesday and Thursday          Objective:    BP 133/82   Pulse 93   Temp 98 F (36.7 C) (Oral)   Ht 5' 4.5" (1.638 m)   Wt 193 lb 12.8 oz (87.9 kg)   BMI 32.75 kg/m   Allergies  Allergen Reactions  . Other Itching, Nausea Only and Other (See Comments)    Acidic foods cause stomach upset and itching  . Allopurinol Rash  . Penicillins Rash    Name brand  . Sulfa Antibiotics Rash    Physical Exam  Constitutional: She is oriented to person, place, and time. She appears well-developed and well-nourished.  HENT:  Head: Normocephalic and atraumatic.  Eyes: Conjunctivae and EOM are normal. Pupils are equal, round, and reactive to light.  Cardiovascular: Normal rate, regular rhythm, normal heart sounds and intact distal pulses.   Pulmonary/Chest: Effort normal and breath sounds normal.  Abdominal: Soft. Bowel sounds are normal.  Neurological: She is alert and oriented to person, place, and time. She has normal reflexes.  Skin: Skin is warm and dry. No rash noted.  Psychiatric: She has a normal mood and affect. Her behavior is normal. Judgment and thought content normal.  Nursing note and vitals reviewed.   Results for orders placed or performed in visit on 04/13/16  Bayer DCA Hb A1c Waived  Result Value Ref Range   Bayer DCA Hb A1c Waived 6.1 <7.0 %  CBC with Differential/Platelet  Result Value Ref Range   WBC 5.6 3.4 - 10.8 x10E3/uL   RBC 4.41 3.77 - 5.28 x10E6/uL   Hemoglobin  11.4 11.1 - 15.9 g/dL   Hematocrit 35.9 34.0 - 46.6 %   MCV 81 79 - 97 fL   MCH 25.9 (L) 26.6 - 33.0 pg   MCHC 31.8 31.5 - 35.7 g/dL   RDW 14.2 12.3 - 15.4 %   Platelets 209 150 - 379 x10E3/uL   Neutrophils 67 Not Estab. %   Lymphs 23 Not Estab. %   Monocytes 7 Not Estab. %   Eos 3 Not Estab. %   Basos 0 Not Estab. %   Neutrophils Absolute 3.8 1.4 - 7.0 x10E3/uL   Lymphocytes Absolute 1.3 0.7 - 3.1 x10E3/uL   Monocytes Absolute 0.4 0.1 - 0.9 x10E3/uL   EOS (ABSOLUTE) 0.2 0.0 - 0.4 x10E3/uL   Basophils Absolute 0.0 0.0 - 0.2 x10E3/uL   Immature Granulocytes 0 Not Estab. %   Immature Grans (Abs) 0.0 0.0 - 0.1 x10E3/uL  CMP14+EGFR  Result Value Ref Range   Glucose 110 (H) 65 - 99 mg/dL   BUN 20 8 - 27 mg/dL   Creatinine, Ser 0.96 0.57 - 1.00 mg/dL   GFR calc non Af Amer 63 >59 mL/min/1.73   GFR calc Af Amer 72 >59 mL/min/1.73   BUN/Creatinine Ratio 21 12 - 28   Sodium 143 134 - 144 mmol/L   Potassium 4.2 3.5 - 5.2 mmol/L   Chloride 102 96 - 106 mmol/L   CO2 26 18 - 29 mmol/L   Calcium 10.5 (H) 8.7 - 10.3 mg/dL   Total Protein 8.2 6.0 - 8.5 g/dL   Albumin 4.4 3.6 - 4.8 g/dL   Globulin, Total 3.8 1.5 - 4.5 g/dL   Albumin/Globulin Ratio 1.2 1.2 - 2.2   Bilirubin Total 0.2 0.0 - 1.2 mg/dL   Alkaline Phosphatase 147 (H) 39 - 117 IU/L   AST 18 0 -  40 IU/L   ALT 9 0 - 32 IU/L  Lipid panel  Result Value Ref Range   Cholesterol, Total 160 100 - 199 mg/dL   Triglycerides 102 0 - 149 mg/dL   HDL 43 >39 mg/dL   VLDL Cholesterol Cal 20 5 - 40 mg/dL   LDL Calculated 97 0 - 99 mg/dL   Chol/HDL Ratio 3.7 0.0 - 4.4 ratio units  TSH  Result Value Ref Range   TSH 1.090 0.450 - 4.500 uIU/mL      Assessment & Plan:   1. Essential hypertension - quinapril (ACCUPRIL) 40 MG tablet; Take 1 tablet (40 mg total) by mouth daily.  Dispense: 30 tablet; Refill: 11 - cloNIDine (CATAPRES) 0.2 MG tablet; Take 1 tablet (0.2 mg total) by mouth 2 (two) times daily.  Dispense: 60 tablet; Refill:  11 - hydrALAZINE (APRESOLINE) 25 MG tablet; Take 1 tablet (25 mg total) by mouth 3 (three) times daily. FOR BLOOD PRESSURE  Dispense: 90 tablet; Refill: 11  2. Generalized anxiety disorder - citalopram (CELEXA) 20 MG tablet; Take 2 tablets (40 mg total) by mouth daily.  Dispense: 60 tablet; Refill: 11 - ALPRAZolam (XANAX) 0.5 MG tablet; Take 1 tablet (0.5 mg total) by mouth 3 (three) times daily.  Dispense: 90 tablet; Refill: 5   Continue all other maintenance medications as listed above.  Follow up plan: Return in about 6 months (around 02/16/2017).  Educational handout given for hypertension  Terald Sleeper PA-C [facility] [address]  [place], [place] [address] [phone]   08/18/2016, 8:29 AM

## 2016-09-11 ENCOUNTER — Other Ambulatory Visit: Payer: Self-pay | Admitting: Physician Assistant

## 2016-09-15 ENCOUNTER — Other Ambulatory Visit: Payer: Self-pay | Admitting: Physician Assistant

## 2016-12-04 ENCOUNTER — Other Ambulatory Visit: Payer: Self-pay | Admitting: Physician Assistant

## 2016-12-04 DIAGNOSIS — E119 Type 2 diabetes mellitus without complications: Secondary | ICD-10-CM

## 2017-01-01 ENCOUNTER — Other Ambulatory Visit: Payer: Self-pay | Admitting: *Deleted

## 2017-01-01 DIAGNOSIS — E119 Type 2 diabetes mellitus without complications: Secondary | ICD-10-CM

## 2017-01-01 DIAGNOSIS — F411 Generalized anxiety disorder: Secondary | ICD-10-CM

## 2017-01-01 MED ORDER — CITALOPRAM HYDROBROMIDE 20 MG PO TABS: 40.0000 mg | ORAL_TABLET | Freq: Every day | ORAL | 0 refills | Status: DC

## 2017-01-01 MED ORDER — MONTELUKAST SODIUM 10 MG PO TABS: ORAL_TABLET | ORAL | 0 refills | Status: DC

## 2017-01-01 MED ORDER — PIOGLITAZONE HCL 30 MG PO TABS: 30.0000 mg | ORAL_TABLET | Freq: Every day | ORAL | 0 refills | Status: DC

## 2017-01-25 ENCOUNTER — Ambulatory Visit: Payer: Medicare Other | Admitting: Physician Assistant

## 2017-01-30 ENCOUNTER — Encounter: Payer: Self-pay | Admitting: Physician Assistant

## 2017-01-30 ENCOUNTER — Ambulatory Visit (INDEPENDENT_AMBULATORY_CARE_PROVIDER_SITE_OTHER): Payer: Medicare Other | Admitting: Physician Assistant

## 2017-01-30 VITALS — BP 96/73 | HR 89 | Temp 97.7°F | Ht 64.5 in | Wt 184.0 lb

## 2017-01-30 DIAGNOSIS — I10 Essential (primary) hypertension: Secondary | ICD-10-CM | POA: Diagnosis not present

## 2017-01-30 DIAGNOSIS — R946 Abnormal results of thyroid function studies: Secondary | ICD-10-CM

## 2017-01-30 DIAGNOSIS — J4520 Mild intermittent asthma, uncomplicated: Secondary | ICD-10-CM | POA: Diagnosis not present

## 2017-01-30 DIAGNOSIS — M159 Polyosteoarthritis, unspecified: Secondary | ICD-10-CM | POA: Diagnosis not present

## 2017-01-30 DIAGNOSIS — E119 Type 2 diabetes mellitus without complications: Secondary | ICD-10-CM | POA: Diagnosis not present

## 2017-01-30 DIAGNOSIS — R7989 Other specified abnormal findings of blood chemistry: Secondary | ICD-10-CM

## 2017-01-30 DIAGNOSIS — F411 Generalized anxiety disorder: Secondary | ICD-10-CM

## 2017-01-30 DIAGNOSIS — N189 Chronic kidney disease, unspecified: Secondary | ICD-10-CM

## 2017-01-30 LAB — BAYER DCA HB A1C WAIVED: HB A1C (BAYER DCA - WAIVED): 6.6 % (ref <7.0)

## 2017-01-30 MED ORDER — BUDESONIDE-FORMOTEROL FUMARATE 160-4.5 MCG/ACT IN AERO: 2.0000 | INHALATION_SPRAY | Freq: Two times a day (BID) | RESPIRATORY_TRACT | 3 refills | Status: DC

## 2017-01-30 MED ORDER — ALPRAZOLAM 0.5 MG PO TABS: 0.5000 mg | ORAL_TABLET | Freq: Every evening | ORAL | 1 refills | Status: DC | PRN

## 2017-01-30 MED ORDER — HYDRALAZINE HCL 25 MG PO TABS: 12.5000 mg | ORAL_TABLET | Freq: Three times a day (TID) | ORAL | 0 refills | Status: DC

## 2017-01-30 NOTE — Progress Notes (Signed)
BP 96/73   Pulse 89   Temp 97.7 F (36.5 C) (Oral)   Ht 5' 4.5" (1.638 m)   Wt 184 lb (83.5 kg)   SpO2 100%   BMI 31.10 kg/m    Subjective:    Patient ID: [name], female    DOB: [birth year], 66 y.o.   MRN: [id number]  HPI: [name] is a 66 y.o. female presenting on 01/30/2017 for Shortness of Breath (Patient states she has been sob since surgery )  This patient comes in for periodic recheck on medications and conditions including HTN, asthma uncontrolled, diabetes, generalized OA, CKD. The patient got off of her Symbicort. After reviewing all of her medications this is the main culprit for her dyspnea. It is happening on exertion. She will get tight and coughing when it happens. She is using her albuterol. She felt that it was not working as well since she was having some episodes. She is very hard time being outside in the humidity. We will restart her medication. The Symbicort is not covered we will Breo. All of her other medications are reviewed today. She is due annual labs. She has an appointment later in the month with Dr. Jacinta Shoe, cardiology.   All medications are reviewed today. There are no reports of any problems with the medications. All of the medical conditions are reviewed and updated.  Lab work is reviewed and will be ordered as medically necessary. There are no new problems reported with today's visit.   Relevant past medical, surgical, family and social history reviewed and updated as indicated. Allergies and medications reviewed and updated.  Past Medical History:  Diagnosis Date  . Allergic rhinitis   . Allergy   . Anemia   . Ankle swelling   . Anxiety    takes Xanax daily   . Arthritis   . Asthma   . Blurred vision, bilateral   . Chronic kidney disease   . Depression    takes Celexa daily  . Diabetes mellitus without complication ([place])    type II; takes Actos daily  . GERD (gastroesophageal reflux disease)   . Gout   . Heart murmur   .  Heart palpitations   . Hemorrhoids   . History of blood transfusion    no abnormal reaction noted  . History of bronchitis   . Hyperlipidemia   . Hypertension    takes Catapres and Quinapril daily  . Hypothyroidism    takes Synthroid daily  . Moderate mitral insufficiency    a. 12/2014 Echo: EF 55-60%, mod MR, mildly to moderately dil LA, PASP 52mHg.  . Osteoarthritis   . Paroxysmal [place]ventricular tachycardia ([place]   . Ringing in ears   . Shortness of breath dyspnea    ALbuterol daily as needed  . [place]   . Urinary incontinence     Past Surgical History:  Procedure Laterality Date  . ABDOMINAL HYSTERECTOMY    . COLON SURGERY    . COLONOSCOPY    . ESOPHAGOGASTRODUODENOSCOPY    . HERNIA REPAIR     hiatal hernia repair  . TOTAL HIP ARTHROPLASTY Left 02/02/2015   Procedure: LEFT TOTAL HIP ARTHROPLASTY;  Surgeon: PGarald Balding MD;  [facility]  Service: Orthopedics;  Laterality: Left;  . TOTAL HIP ARTHROPLASTY Right 02/01/2016   Procedure: TOTAL HIP ARTHROPLASTY;  Surgeon: PGarald Balding MD;  [facility]  Service: Orthopedics;  Laterality: Right;    Review of Systems  Constitutional:  Positive for fatigue. Negative for activity change and fever.  HENT: Negative.   Eyes: Negative.   Respiratory: Positive for shortness of breath and wheezing. Negative for cough.   Cardiovascular: Positive for chest pain. Negative for palpitations and leg swelling.  Gastrointestinal: Negative.  Negative for abdominal pain.  Endocrine: Negative.   Genitourinary: Negative.  Negative for dysuria.  Musculoskeletal: Positive for arthralgias, back pain and joint swelling.  Skin: Negative.   Neurological: Negative.     Allergies as of 01/30/2017      Reactions   Other Itching, Nausea Only, Other (See Comments)   Acidic foods cause stomach upset and itching   Allopurinol Rash   Penicillins Rash   Name brand   Sulfa Antibiotics Rash      Medication  List       Accurate as of 01/30/17  1:59 PM. Always use your most recent med list.          albuterol 108 (90 Base) MCG/ACT inhaler Commonly known as:  PROVENTIL HFA;VENTOLIN HFA Inhale 2 puffs into the lungs 2 (two) times daily as needed for wheezing or shortness of breath.   ALIVE ONCE DAILY WOMENS PO Take 1 tablet by mouth daily.   ALPRAZolam 0.5 MG tablet Commonly known as:  XANAX Take 1 tablet (0.5 mg total) by mouth at bedtime as needed for anxiety.   alum & mag hydroxide-simeth 200-200-20 MG/5ML suspension Commonly known as:  MAALOX/MYLANTA Take 30 mLs by mouth every 6 (six) hours as needed for indigestion or heartburn.   aspirin EC 81 MG tablet Take 81 mg by mouth daily.   atorvastatin 20 MG tablet Commonly known as:  LIPITOR Take 20 mg by mouth every evening.   BIOFLAVONOIDS PO Take 2 tablets by mouth 2 (two) times daily.   budesonide-formoterol 160-4.5 MCG/ACT inhaler Commonly known as:  SYMBICORT Inhale 2 puffs into the lungs 2 (two) times daily.   cetirizine 10 MG tablet Commonly known as:  ZYRTEC Take 10 mg by mouth daily.   citalopram 20 MG tablet Commonly known as:  CELEXA Take 2 tablets (40 mg total) by mouth daily.   cloNIDine 0.2 MG tablet Commonly known as:  CATAPRES Take 1 tablet (0.2 mg total) by mouth 2 (two) times daily.   cromolyn 5.2 MG/ACT nasal spray Commonly known as:  NASALCROM Place 1 spray into both nostrils daily as needed for allergies.   dextromethorphan-guaiFENesin 30-600 MG 12hr tablet Commonly known as:  MUCINEX DM Take 1 tablet by mouth 2 (two) times daily.   docusate sodium 100 MG capsule Commonly known as:  COLACE Take 100 mg by mouth daily.   folic acid 629 MCG tablet Commonly known as:  FOLVITE Take 400 mcg by mouth daily.   furosemide 40 MG tablet Commonly known as:  LASIX TAKE 1 TABLET BY MOUTH EVERY DAY   GAS-X EXTRA STRENGTH 125 MG chewable tablet Generic drug:  simethicone Chew 125 mg by mouth every 6  (six) hours as needed for flatulence.   hydrALAZINE 25 MG tablet Commonly known as:  APRESOLINE Take 0.5 tablets (12.5 mg total) by mouth 3 (three) times daily. FOR BLOOD PRESSURE   hydrOXYzine 10 MG tablet Commonly known as:  ATARAX/VISTARIL Take 10 mg by mouth every 6 (six) hours as needed (itching from hydrocodone/apap).   levothyroxine 88 MCG tablet Commonly known as:  SYNTHROID, LEVOTHROID TAKE 1 TABLET BY MOUTH EVERY DAY   Magnesium 400 MG Tabs Take 400 mg by mouth daily.   montelukast 10 MG  tablet Commonly known as:  SINGULAIR TAKE 1 TABLET IN THE EVENING ONCE A DAY ORALLY 30 DAYS ORAL   omeprazole 40 MG capsule Commonly known as:  PRILOSEC Take 40 mg by mouth daily.   pioglitazone 30 MG tablet Commonly known as:  ACTOS Take 1 tablet (30 mg total) by mouth daily.   quinapril 40 MG tablet Commonly known as:  ACCUPRIL Take 1 tablet (40 mg total) by mouth daily.   Vitamin D (Ergocalciferol) 50000 units Caps capsule Commonly known as:  DRISDOL Take 1 capsule (50,000 Units total) by mouth 2 (two) times a week. Tuesday and Thursday          Objective:    BP 96/73   Pulse 89   Temp 97.7 F (36.5 C) (Oral)   Ht 5' 4.5" (1.638 m)   Wt 184 lb (83.5 kg)   SpO2 100%   BMI 31.10 kg/m   Allergies  Allergen Reactions  . Other Itching, Nausea Only and Other (See Comments)    Acidic foods cause stomach upset and itching  . Allopurinol Rash  . Penicillins Rash    Name brand  . Sulfa Antibiotics Rash    Physical Exam  Constitutional: She is oriented to person, place, and time. She appears well-developed and well-nourished.  HENT:  Head: Normocephalic and atraumatic.  Right Ear: Tympanic membrane, external ear and ear canal normal.  Left Ear: Tympanic membrane, external ear and ear canal normal.  Nose: Nose normal. No rhinorrhea.  Mouth/Throat: Oropharynx is clear and moist and mucous membranes are normal. No oropharyngeal exudate or posterior oropharyngeal  erythema.  Eyes: Pupils are equal, round, and reactive to light. Conjunctivae and EOM are normal.  Neck: Normal range of motion. Neck supple.  Cardiovascular: Normal rate, regular rhythm, normal heart sounds and intact distal pulses.   Pulmonary/Chest: Effort normal and breath sounds normal.  Abdominal: Soft. Bowel sounds are normal.  Neurological: She is alert and oriented to person, place, and time. She has normal reflexes.  Skin: Skin is warm and dry. No rash noted.  Psychiatric: She has a normal mood and affect. Her behavior is normal. Judgment and thought content normal.    Results for orders placed or performed in visit on 01/30/17  Bayer DCA Hb A1c Waived  Result Value Ref Range   Bayer DCA Hb A1c Waived 6.6 <7.0 %      Assessment & Plan:   1. Essential hypertension - hydrALAZINE (APRESOLINE) 25 MG tablet; Take 0.5 tablets (12.5 mg total) by mouth 3 (three) times daily. FOR BLOOD PRESSURE  Dispense: 90 tablet; Refill: 0 - CMP14+EGFR - CBC with Differential/Platelet - Lipid panel - TSH - Bayer DCA Hb A1c Waived - Microalbumin / creatinine urine ratio  2. Mild intermittent asthma, unspecified whether complicated - budesonide-formoterol (SYMBICORT) 160-4.5 MCG/ACT inhaler; Inhale 2 puffs into the lungs 2 (two) times daily.  Dispense: 3 Inhaler; Refill: 3  3. Diabetes mellitus without complication ([place]) - ONG29+BMWU - CBC with Differential/Platelet - Lipid panel - TSH - Bayer DCA Hb A1c Waived - Microalbumin / creatinine urine ratio  4. Generalized OA  5. Chronic kidney disease, unspecified CKD stage - CMP14+EGFR - Microalbumin / creatinine urine ratio  6. Generalized anxiety disorder - ALPRAZolam (XANAX) 0.5 MG tablet; Take 1 tablet (0.5 mg total) by mouth at bedtime as needed for anxiety.  Dispense: 90 tablet; Refill: 1    Current Outpatient Prescriptions:  .  albuterol (PROVENTIL HFA;VENTOLIN HFA) 108 (90 Base) MCG/ACT inhaler, Inhale 2 puffs  into the lungs 2  (two) times daily as needed for wheezing or shortness of breath., Disp: 18 g, Rfl: 6 .  ALPRAZolam (XANAX) 0.5 MG tablet, Take 1 tablet (0.5 mg total) by mouth at bedtime as needed for anxiety., Disp: 90 tablet, Rfl: 1 .  alum & mag hydroxide-simeth (MAALOX/MYLANTA) 200-200-20 MG/5ML suspension, Take 30 mLs by mouth every 6 (six) hours as needed for indigestion or heartburn., Disp: , Rfl:  .  aspirin EC 81 MG tablet, Take 81 mg by mouth daily., Disp: , Rfl:  .  atorvastatin (LIPITOR) 20 MG tablet, Take 20 mg by mouth every evening., Disp: , Rfl: 4 .  BIOFLAVONOIDS PO, Take 2 tablets by mouth 2 (two) times daily., Disp: , Rfl:  .  cetirizine (ZYRTEC) 10 MG tablet, Take 10 mg by mouth daily., Disp: , Rfl:  .  citalopram (CELEXA) 20 MG tablet, Take 2 tablets (40 mg total) by mouth daily., Disp: 180 tablet, Rfl: 0 .  cloNIDine (CATAPRES) 0.2 MG tablet, Take 1 tablet (0.2 mg total) by mouth 2 (two) times daily., Disp: 60 tablet, Rfl: 11 .  cromolyn (NASALCROM) 5.2 MG/ACT nasal spray, Place 1 spray into both nostrils daily as needed for allergies., Disp: , Rfl:  .  dextromethorphan-guaiFENesin (MUCINEX DM) 30-600 MG 12hr tablet, Take 1 tablet by mouth 2 (two) times daily., Disp: , Rfl:  .  docusate sodium (COLACE) 100 MG capsule, Take 100 mg by mouth daily., Disp: , Rfl:  .  folic acid (FOLVITE) 800 MCG tablet, Take 400 mcg by mouth daily., Disp: , Rfl:  .  furosemide (LASIX) 40 MG tablet, TAKE 1 TABLET BY MOUTH EVERY DAY, Disp: 30 tablet, Rfl: 5 .  hydrALAZINE (APRESOLINE) 25 MG tablet, Take 0.5 tablets (12.5 mg total) by mouth 3 (three) times daily. FOR BLOOD PRESSURE, Disp: 90 tablet, Rfl: 0 .  hydrOXYzine (ATARAX/VISTARIL) 10 MG tablet, Take 10 mg by mouth every 6 (six) hours as needed (itching from hydrocodone/apap)., Disp: , Rfl:  .  levothyroxine (SYNTHROID, LEVOTHROID) 88 MCG tablet, TAKE 1 TABLET BY MOUTH EVERY DAY, Disp: 30 tablet, Rfl: 11 .  Magnesium 400 MG TABS, Take 400 mg by mouth daily.,  Disp: , Rfl:  .  montelukast (SINGULAIR) 10 MG tablet, TAKE 1 TABLET IN THE EVENING ONCE A DAY ORALLY 30 DAYS ORAL, Disp: 90 tablet, Rfl: 0 .  Multiple Vitamins-Minerals (ALIVE ONCE DAILY WOMENS PO), Take 1 tablet by mouth daily., Disp: , Rfl:  .  omeprazole (PRILOSEC) 40 MG capsule, Take 40 mg by mouth daily., Disp: , Rfl:  .  pioglitazone (ACTOS) 30 MG tablet, Take 1 tablet (30 mg total) by mouth daily., Disp: 90 tablet, Rfl: 0 .  quinapril (ACCUPRIL) 40 MG tablet, Take 1 tablet (40 mg total) by mouth daily., Disp: 30 tablet, Rfl: 11 .  simethicone (GAS-X EXTRA STRENGTH) 125 MG chewable tablet, Chew 125 mg by mouth every 6 (six) hours as needed for flatulence., Disp: , Rfl:  .  Vitamin D, Ergocalciferol, (DRISDOL) 50000 units CAPS capsule, Take 1 capsule (50,000 Units total) by mouth 2 (two) times a week. Tuesday and Thursday, Disp: 30 capsule, Rfl: 6 .  budesonide-formoterol (SYMBICORT) 160-4.5 MCG/ACT inhaler, Inhale 2 puffs into the lungs 2 (two) times daily., Disp: 3 Inhaler, Rfl: 3 Continue all other maintenance medications as listed above.  Follow up plan: Return in about 4 weeks (around 02/27/2017) for recheck.  Educational handout given for survey  Remus Loffler PA-C [facility] [phone]  [place], [place] [address] [phone]   01/30/2017, 1:59 PM

## 2017-01-30 NOTE — Patient Instructions (Signed)
In a few days you may receive a survey in the mail or online from [employer] regarding your visit with us today. Please take a moment to fill this out. Your feedback is very important to our whole office. It can help us better understand your needs as well as improve your experience and satisfaction. Thank you for taking your time to complete it. We care about you.  Boubacar Lerette, PA-C  

## 2017-01-31 ENCOUNTER — Encounter: Payer: Self-pay | Admitting: Physician Assistant

## 2017-01-31 LAB — CMP14+EGFR
ALBUMIN: 4.3 g/dL (ref 3.6–4.8)
ALT: 12 IU/L (ref 0–32)
AST: 24 IU/L (ref 0–40)
Albumin/Globulin Ratio: 1.4 (ref 1.2–2.2)
Alkaline Phosphatase: 138 IU/L — ABNORMAL HIGH (ref 39–117)
BUN/Creatinine Ratio: 21 (ref 12–28)
BUN: 26 mg/dL (ref 8–27)
Bilirubin Total: 0.2 mg/dL (ref 0.0–1.2)
CALCIUM: 10 mg/dL (ref 8.7–10.3)
CO2: 23 mmol/L (ref 20–29)
Chloride: 102 mmol/L (ref 96–106)
Creatinine, Ser: 1.21 mg/dL — ABNORMAL HIGH (ref 0.57–1.00)
GFR, EST AFRICAN AMERICAN: 54 mL/min/{1.73_m2} — AB (ref >59)
GFR, EST NON AFRICAN AMERICAN: 47 mL/min/{1.73_m2} — AB (ref >59)
GLUCOSE: 107 mg/dL — AB (ref 65–99)
Globulin, Total: 3 g/dL (ref 1.5–4.5)
Potassium: 4.7 mmol/L (ref 3.5–5.2)
Sodium: 140 mmol/L (ref 134–144)
TOTAL PROTEIN: 7.3 g/dL (ref 6.0–8.5)

## 2017-01-31 LAB — CBC WITH DIFFERENTIAL/PLATELET
BASOS: 0 %
Basophils Absolute: 0 10*3/uL (ref 0.0–0.2)
EOS (ABSOLUTE): 0.1 10*3/uL (ref 0.0–0.4)
EOS: 1 %
HEMATOCRIT: 32.5 % — AB (ref 34.0–46.6)
HEMOGLOBIN: 10.8 g/dL — AB (ref 11.1–15.9)
IMMATURE GRANS (ABS): 0 10*3/uL (ref 0.0–0.1)
Immature Granulocytes: 0 %
LYMPHS: 28 %
Lymphocytes Absolute: 1.5 10*3/uL (ref 0.7–3.1)
MCH: 25.5 pg — AB (ref 26.6–33.0)
MCHC: 33.2 g/dL (ref 31.5–35.7)
MCV: 77 fL — AB (ref 79–97)
MONOCYTES: 7 %
Monocytes Absolute: 0.3 10*3/uL (ref 0.1–0.9)
NEUTROS ABS: 3.4 10*3/uL (ref 1.4–7.0)
Neutrophils: 64 %
Platelets: 234 10*3/uL (ref 150–379)
RBC: 4.24 x10E6/uL (ref 3.77–5.28)
RDW: 13.7 % (ref 12.3–15.4)
WBC: 5.3 10*3/uL (ref 3.4–10.8)

## 2017-01-31 LAB — MICROALBUMIN / CREATININE URINE RATIO
Creatinine, Urine: 185 mg/dL
MICROALB/CREAT RATIO: 8.8 mg/g{creat} (ref 0.0–30.0)
MICROALBUM., U, RANDOM: 16.2 ug/mL

## 2017-01-31 LAB — LIPID PANEL
CHOLESTEROL TOTAL: 163 mg/dL (ref 100–199)
Chol/HDL Ratio: 4.8 ratio — ABNORMAL HIGH (ref 0.0–4.4)
HDL: 34 mg/dL — ABNORMAL LOW (ref >39)
LDL CALC: 98 mg/dL (ref 0–99)
Triglycerides: 154 mg/dL — ABNORMAL HIGH (ref 0–149)
VLDL Cholesterol Cal: 31 mg/dL (ref 5–40)

## 2017-01-31 LAB — TSH: TSH: 0.265 u[IU]/mL — AB (ref 0.450–4.500)

## 2017-02-02 ENCOUNTER — Other Ambulatory Visit: Payer: Self-pay | Admitting: Physician Assistant

## 2017-02-02 DIAGNOSIS — I10 Essential (primary) hypertension: Secondary | ICD-10-CM

## 2017-02-02 MED ORDER — LEVOTHYROXINE SODIUM 75 MCG PO TABS
75.0000 ug | ORAL_TABLET | Freq: Every day | ORAL | 11 refills | Status: DC
Start: 2017-02-02 — End: 2017-04-13

## 2017-02-02 NOTE — Addendum Note (Signed)
Addended by: Thana Ates on: 02/02/2017 11:06 AM   Modules accepted: Orders

## 2017-02-08 ENCOUNTER — Ambulatory Visit (INDEPENDENT_AMBULATORY_CARE_PROVIDER_SITE_OTHER): Payer: Medicare Other | Admitting: Cardiovascular Disease

## 2017-02-08 ENCOUNTER — Encounter: Payer: Self-pay | Admitting: Cardiovascular Disease

## 2017-02-08 VITALS — BP 120/78 | HR 104 | Ht 64.5 in | Wt 185.0 lb

## 2017-02-08 DIAGNOSIS — E785 Hyperlipidemia, unspecified: Secondary | ICD-10-CM | POA: Diagnosis not present

## 2017-02-08 DIAGNOSIS — I10 Essential (primary) hypertension: Secondary | ICD-10-CM

## 2017-02-08 DIAGNOSIS — I38 Endocarditis, valve unspecified: Secondary | ICD-10-CM | POA: Diagnosis not present

## 2017-02-08 DIAGNOSIS — R002 Palpitations: Secondary | ICD-10-CM | POA: Diagnosis not present

## 2017-02-08 DIAGNOSIS — I340 Nonrheumatic mitral (valve) insufficiency: Secondary | ICD-10-CM | POA: Diagnosis not present

## 2017-02-08 MED ORDER — METOPROLOL TARTRATE 25 MG PO TABS: 25.0000 mg | ORAL_TABLET | Freq: Two times a day (BID) | ORAL | 6 refills | Status: DC

## 2017-02-08 MED ORDER — METOPROLOL TARTRATE 25 MG PO TABS: 12.5000 mg | ORAL_TABLET | Freq: Two times a day (BID) | ORAL | 6 refills | Status: DC

## 2017-02-08 NOTE — Progress Notes (Signed)
SUBJECTIVE: The patient presents for annual follow-up. I evaluated her for preoperative risk stratification for right hip replacement in April 2017. She underwent a normal nuclear stress test in July 2016 and had normal left ventricular systolic function with moderate mitral regurgitation by echocardiogram in July 2016.  She then underwent a subsequent echocardiogram on 01/26/16 which demonstrated normal left ventricular systolic function, LVEF 27-74%, grade 2 diastolic dysfunction with increased LV filling pressures, moderate mitral regurgitation, and mild to moderate tricuspid regurgitation.  She denies chest pain. She complains of palpitations. TSH 0.265 on 01/30/17. Levothyroxine dose was adjusted.  ECG performed in the office today which I ordered and personally interpreted demonstrates normal sinus rhythm (89 bpm) with no ischemic ST segment or T-wave abnormalities, nor any arrhythmias.    Review of Systems: As per "subjective", otherwise negative.  Allergies  Allergen Reactions  . Other Itching, Nausea Only and Other (See Comments)    Acidic foods cause stomach upset and itching  . Allopurinol Rash  . Penicillins Rash    Name brand  . Sulfa Antibiotics Rash    Current Outpatient Prescriptions  Medication Sig Dispense Refill  . albuterol (PROVENTIL HFA;VENTOLIN HFA) 108 (90 Base) MCG/ACT inhaler Inhale 2 puffs into the lungs 2 (two) times daily as needed for wheezing or shortness of breath. 18 g 6  . ALPRAZolam (XANAX) 0.5 MG tablet Take 1 tablet (0.5 mg total) by mouth at bedtime as needed for anxiety. 90 tablet 1  . alum & mag hydroxide-simeth (MAALOX/MYLANTA) 200-200-20 MG/5ML suspension Take 30 mLs by mouth every 6 (six) hours as needed for indigestion or heartburn.    [name] aspirin EC 81 MG tablet Take 81 mg by mouth daily.    [name] atorvastatin (LIPITOR) 20 MG tablet Take 20 mg by mouth every evening.  4  . BIOFLAVONOIDS PO Take 2 tablets by mouth 2 (two) times daily.    .  budesonide-formoterol (SYMBICORT) 160-4.5 MCG/ACT inhaler Inhale 2 puffs into the lungs 2 (two) times daily. 3 Inhaler 3  . cetirizine (ZYRTEC) 10 MG tablet Take 10 mg by mouth daily.    . citalopram (CELEXA) 20 MG tablet Take 2 tablets (40 mg total) by mouth daily. 180 tablet 0  . cloNIDine (CATAPRES) 0.2 MG tablet Take 1 tablet (0.2 mg total) by mouth 2 (two) times daily. 60 tablet 11  . cromolyn (NASALCROM) 5.2 MG/ACT nasal spray Place 1 spray into both nostrils daily as needed for allergies.    [name] dextromethorphan-guaiFENesin (MUCINEX DM) 30-600 MG 12hr tablet Take 1 tablet by mouth 2 (two) times daily.    [name] docusate sodium (COLACE) 100 MG capsule Take 100 mg by mouth daily.    . folic acid (FOLVITE) 128 MCG tablet Take 400 mcg by mouth daily.    . furosemide (LASIX) 40 MG tablet TAKE 1 TABLET BY MOUTH EVERY DAY 30 tablet 5  . hydrALAZINE (APRESOLINE) 25 MG tablet Take 0.5 tablets (12.5 mg total) by mouth 3 (three) times daily. FOR BLOOD PRESSURE 90 tablet 0  . hydrOXYzine (ATARAX/VISTARIL) 10 MG tablet TAKE 1 TABLET BY MOUTH 3 TIMES DAILY FOR SEVERE ITCHING 90 tablet 1  . levothyroxine (SYNTHROID, LEVOTHROID) 75 MCG tablet Take 1 tablet (75 mcg total) by mouth daily. 30 tablet 11  . Magnesium 400 MG TABS Take 400 mg by mouth daily.    . montelukast (SINGULAIR) 10 MG tablet TAKE 1 TABLET IN THE EVENING ONCE A DAY ORALLY 30 DAYS ORAL 90 tablet 0  .  Multiple Vitamins-Minerals (ALIVE ONCE DAILY WOMENS PO) Take 1 tablet by mouth daily.    [name] omeprazole (PRILOSEC) 40 MG capsule Take 40 mg by mouth daily.    . pioglitazone (ACTOS) 30 MG tablet Take 1 tablet (30 mg total) by mouth daily. 90 tablet 0  . quinapril (ACCUPRIL) 40 MG tablet Take 1 tablet (40 mg total) by mouth daily. 30 tablet 11  . simethicone (GAS-X EXTRA STRENGTH) 125 MG chewable tablet Chew 125 mg by mouth every 6 (six) hours as needed for flatulence.    . Vitamin D, Ergocalciferol, (DRISDOL) 50000 units CAPS capsule Take 1 capsule  (50,000 Units total) by mouth 2 (two) times a week. Tuesday and Thursday 30 capsule 6   No current facility-administered medications for this visit.     Past Medical History:  Diagnosis Date  . Allergic rhinitis   . Allergy   . Anemia   . Ankle swelling   . Anxiety    takes Xanax daily   . Arthritis   . Asthma   . Blurred vision, bilateral   . Chronic kidney disease   . Depression    takes Celexa daily  . Diabetes mellitus without complication ([place])    type II; takes Actos daily  . GERD (gastroesophageal reflux disease)   . Gout   . Heart murmur   . Heart palpitations   . Hemorrhoids   . History of blood transfusion    no abnormal reaction noted  . History of bronchitis   . Hyperlipidemia   . Hypertension    takes Catapres and Quinapril daily  . Hypothyroidism    takes Synthroid daily  . Moderate mitral insufficiency    a. 12/2014 Echo: EF 55-60%, mod MR, mildly to moderately dil LA, PASP 57mmHg.  . Osteoarthritis   . Paroxysmal supraventricular tachycardia ([place])   . Ringing in ears   . Shortness of breath dyspnea    ALbuterol daily as needed  . Stevens-Johnson disease ([place])   . Urinary incontinence     Past Surgical History:  Procedure Laterality Date  . ABDOMINAL HYSTERECTOMY    . COLON SURGERY    . COLONOSCOPY    . ESOPHAGOGASTRODUODENOSCOPY    . HERNIA REPAIR     hiatal hernia repair  . TOTAL HIP ARTHROPLASTY Left 02/02/2015   Procedure: LEFT TOTAL HIP ARTHROPLASTY;  Surgeon: Garald Balding, MD;  Location: [facility];  Service: Orthopedics;  Laterality: Left;  . TOTAL HIP ARTHROPLASTY Right 02/01/2016   Procedure: TOTAL HIP ARTHROPLASTY;  Surgeon: Garald Balding, MD;  Location: [facility];  Service: Orthopedics;  Laterality: Right;    Social History   Social History  . Marital status: Single    Spouse name: N/A  . Number of children: N/A  . Years of education: N/A   Occupational History  . Not on file.   Social History Main Topics  . Smoking status:  Never Smoker  . Smokeless tobacco: Never Used  . Alcohol use No  . Drug use: No  . Sexual activity: Not on file   Other Topics Concern  . Not on file   Social History Narrative  . No narrative on file     Vitals:   02/08/17 1122  BP: 120/78  Pulse: (!) 104  SpO2: 97%  Weight: 185 lb (83.9 kg)  Height: 5' 4.5" (1.638 m)    Wt Readings from Last 3 Encounters:  02/08/17 185 lb (83.9 kg)  01/30/17 184 lb (83.5 kg)  08/16/16 193  lb 12.8 oz (87.9 kg)     PHYSICAL EXAM General: NAD HEENT: Normal. Neck: No JVD, no thyromegaly. Lungs: Clear to auscultation bilaterally with normal respiratory effort. CV: Nondisplaced PMI.  Regular rate and rhythm, normal S1/S2, no S3/S4, no murmur. No pretibial or periankle edema.  No carotid bruit.   Abdomen: Soft, nontender, no distention.  Neurologic: Alert and oriented.  Psych: Normal affect. Skin: Normal. Musculoskeletal: No gross deformities.    ECG: Most recent ECG reviewed.   Labs: Lab Results  Component Value Date/Time   K 4.7 01/30/2017 11:59 AM   BUN 26 01/30/2017 11:59 AM   CREATININE 1.21 (H) 01/30/2017 11:59 AM   ALT 12 01/30/2017 11:59 AM   TSH 0.265 (L) 01/30/2017 11:59 AM   HGB 10.8 (L) 01/30/2017 11:59 AM     Lipids: Lab Results  Component Value Date/Time   LDLCALC 98 01/30/2017 11:59 AM   CHOL 163 01/30/2017 11:59 AM   TRIG 154 (H) 01/30/2017 11:59 AM   HDL 34 (L) 01/30/2017 11:59 AM       ASSESSMENT AND PLAN: 1. Valvular heart disease with moderate mitral and mild to moderate tricuspid regurgitation: Noted on echocardiogram in August 2017 as detailed above. Stable.  2. Hypertension: Controlled. Monitor given institution of metoprolol.  3. Palpitations: Likely being exacerbated by iatrogenic hyperthyroidism. I will initiate metoprolol 12.5 mg twice daily for symptomatic relief and for a short duration. I will follow-up with her in the next 3-4 months to see if this can be weaned.  4. Hyperlipidemia:  On Lipitor 20 mg. Lipids on 01/30/17 showed total cholesterol 163, triglycerides 154, HDL 34, LDL 98. No changes.      Disposition: Follow up 3-4 months.   Kate Sable, M.D., F.A.C.C.

## 2017-02-08 NOTE — Patient Instructions (Addendum)
Medication Instructions:   Begin Metoprolol 12.5mg  twice a day   Continue all other medications.    Labwork: none  Testing/Procedures: none  Follow-Up: 3-4 months   Any Other Special Instructions Will Be Listed Below (If Applicable).  If you need a refill on your cardiac medications before your next appointment, please call your pharmacy.

## 2017-02-16 ENCOUNTER — Ambulatory Visit: Payer: Medicare Other | Admitting: Physician Assistant

## 2017-02-27 ENCOUNTER — Ambulatory Visit (INDEPENDENT_AMBULATORY_CARE_PROVIDER_SITE_OTHER): Payer: Medicare Other | Admitting: Physician Assistant

## 2017-02-27 ENCOUNTER — Encounter: Payer: Self-pay | Admitting: Physician Assistant

## 2017-02-27 VITALS — BP 107/74 | HR 66 | Temp 98.6°F | Ht 64.5 in | Wt 188.0 lb

## 2017-02-27 DIAGNOSIS — I10 Essential (primary) hypertension: Secondary | ICD-10-CM

## 2017-02-27 DIAGNOSIS — K581 Irritable bowel syndrome with constipation: Secondary | ICD-10-CM | POA: Insufficient documentation

## 2017-02-27 DIAGNOSIS — J4520 Mild intermittent asthma, uncomplicated: Secondary | ICD-10-CM | POA: Diagnosis not present

## 2017-02-27 DIAGNOSIS — F411 Generalized anxiety disorder: Secondary | ICD-10-CM | POA: Diagnosis not present

## 2017-02-27 MED ORDER — METOCLOPRAMIDE HCL 10 MG PO TABS: 10.0000 mg | ORAL_TABLET | Freq: Four times a day (QID) | ORAL | 2 refills | Status: DC

## 2017-02-27 MED ORDER — ALPRAZOLAM 0.5 MG PO TABS
0.5000 mg | ORAL_TABLET | Freq: Every evening | ORAL | 1 refills | Status: DC | PRN
Start: 2017-02-27 — End: 2017-08-08

## 2017-02-27 NOTE — Progress Notes (Signed)
BP 107/74   Pulse 66   Temp 98.6 F (37 C) (Oral)   Ht 5' 4.5" (1.638 m)   Wt 188 lb (85.3 kg)   BMI 31.77 kg/m    Subjective:    Patient ID: [name], female    DOB: August 20, 1950, 66 y.o.   MRN: [id number]  HPI: [name] is a 66 y.o. female presenting on 02/27/2017 for Follow-up (4 week rck for asthma control)  She reports after starting the Symbicort she has had a great improvement in her breathing status. She had been on this medication some time ago. She has found that she is using much less albuterol on a day-to-day basis now. We are also rechecking on her irritable bowel syndrome and hypertension. She does need refills on medications. We have discussed her lab work which was performed last month and it appears very Bright. We'll recheck it again in 3 months.  Relevant past medical, surgical, family and social history reviewed and updated as indicated. Allergies and medications reviewed and updated.  Past Medical History:  Diagnosis Date  . Allergic rhinitis   . Allergy   . Anemia   . Ankle swelling   . Anxiety    takes Xanax daily   . Arthritis   . Asthma   . Blurred vision, bilateral   . Chronic kidney disease   . Depression    takes Celexa daily  . Diabetes mellitus without complication ([place])    type II; takes Actos daily  . GERD (gastroesophageal reflux disease)   . Gout   . Heart murmur   . Heart palpitations   . Hemorrhoids   . History of blood transfusion    no abnormal reaction noted  . History of bronchitis   . Hyperlipidemia   . Hypertension    takes Catapres and Quinapril daily  . Hypothyroidism    takes Synthroid daily  . Moderate mitral insufficiency    a. 12/2014 Echo: EF 55-60%, mod MR, mildly to moderately dil LA, PASP 28mHg.  . Osteoarthritis   . Paroxysmal supraventricular tachycardia ([place]   . Ringing in ears   . Shortness of breath dyspnea    ALbuterol daily as needed  . Stevens-Johnson disease ([place]   . Urinary incontinence      Past Surgical History:  Procedure Laterality Date  . ABDOMINAL HYSTERECTOMY    . BOWEL RESECTION    . COLON SURGERY    . COLONOSCOPY    . ESOPHAGOGASTRODUODENOSCOPY    . HERNIA REPAIR     hiatal hernia repair  . TOTAL HIP ARTHROPLASTY Left 02/02/2015   Procedure: LEFT TOTAL HIP ARTHROPLASTY;  Surgeon: PGarald Balding MD;  [facility]  Service: Orthopedics;  Laterality: Left;  . TOTAL HIP ARTHROPLASTY Right 02/01/2016   Procedure: TOTAL HIP ARTHROPLASTY;  Surgeon: PGarald Balding MD;  [facility]  Service: Orthopedics;  Laterality: Right;    Review of Systems  Constitutional: Negative.  Negative for activity change, fatigue and fever.  HENT: Negative.   Eyes: Negative.   Respiratory: Negative.  Negative for cough.   Cardiovascular: Negative.  Negative for chest pain.  Gastrointestinal: Positive for abdominal pain and constipation.  Endocrine: Negative.   Genitourinary: Negative.  Negative for dysuria.  Musculoskeletal: Positive for arthralgias and gait problem.  Skin: Negative.   Psychiatric/Behavioral: The patient is nervous/anxious.     Allergies as of 02/27/2017      Reactions   Other Itching, Nausea Only, Other (See Comments)  Acidic foods cause stomach upset and itching   Allopurinol Rash   Penicillins Rash   Name brand   Sulfa Antibiotics Rash      Medication List       Accurate as of 02/27/17 11:50 AM. Always use your most recent med list.          albuterol 108 (90 Base) MCG/ACT inhaler Commonly known as:  PROVENTIL HFA;VENTOLIN HFA Inhale 2 puffs into the lungs 2 (two) times daily as needed for wheezing or shortness of breath.   ALIVE ONCE DAILY WOMENS PO Take 1 tablet by mouth daily.   ALPRAZolam 0.5 MG tablet Commonly known as:  XANAX Take 1 tablet (0.5 mg total) by mouth at bedtime as needed for anxiety.   alum & mag hydroxide-simeth 200-200-20 MG/5ML suspension Commonly known as:  MAALOX/MYLANTA Take 30 mLs by mouth every 6  (six) hours as needed for indigestion or heartburn.   aspirin EC 81 MG tablet Take 81 mg by mouth daily.   atorvastatin 20 MG tablet Commonly known as:  LIPITOR Take 20 mg by mouth every evening.   BIOFLAVONOIDS PO Take 2 tablets by mouth 2 (two) times daily.   budesonide-formoterol 160-4.5 MCG/ACT inhaler Commonly known as:  SYMBICORT Inhale 2 puffs into the lungs 2 (two) times daily.   cetirizine 10 MG tablet Commonly known as:  ZYRTEC Take 10 mg by mouth daily.   citalopram 20 MG tablet Commonly known as:  CELEXA Take 2 tablets (40 mg total) by mouth daily.   cloNIDine 0.2 MG tablet Commonly known as:  CATAPRES Take 1 tablet (0.2 mg total) by mouth 2 (two) times daily.   cromolyn 5.2 MG/ACT nasal spray Commonly known as:  NASALCROM Place 1 spray into both nostrils daily as needed for allergies.   [place] PO Take by mouth.   dextromethorphan-guaiFENesin 30-600 MG 12hr tablet Commonly known as:  MUCINEX DM Take 1 tablet by mouth 2 (two) times daily.   docusate sodium 100 MG capsule Commonly known as:  COLACE Take 100 mg by mouth daily.   folic acid 962 MCG tablet Commonly known as:  FOLVITE Take 400 mcg by mouth daily.   furosemide 40 MG tablet Commonly known as:  LASIX TAKE 1 TABLET BY MOUTH EVERY DAY   hydrALAZINE 25 MG tablet Commonly known as:  APRESOLINE Take 0.5 tablets (12.5 mg total) by mouth 3 (three) times daily. FOR BLOOD PRESSURE   hydrOXYzine 10 MG tablet Commonly known as:  ATARAX/VISTARIL TAKE 1 TABLET BY MOUTH 3 TIMES DAILY FOR SEVERE ITCHING   levothyroxine 75 MCG tablet Commonly known as:  SYNTHROID, LEVOTHROID Take 1 tablet (75 mcg total) by mouth daily.   Magnesium 400 MG Tabs Take 400 mg by mouth daily.   metoCLOPramide 10 MG tablet Commonly known as:  REGLAN Take 1 tablet (10 mg total) by mouth 4 (four) times daily.   metoprolol tartrate 25 MG tablet Commonly known as:  LOPRESSOR Take 0.5 tablets (12.5  mg total) by mouth 2 (two) times daily.   montelukast 10 MG tablet Commonly known as:  SINGULAIR TAKE 1 TABLET IN THE EVENING ONCE A DAY ORALLY 30 DAYS ORAL   omeprazole 40 MG capsule Commonly known as:  PRILOSEC Take 40 mg by mouth daily.   pioglitazone 30 MG tablet Commonly known as:  ACTOS Take 1 tablet (30 mg total) by mouth daily.   quinapril 40 MG tablet Commonly known as:  ACCUPRIL Take 1 tablet (40 mg total) by mouth  daily.   Vitamin D (Ergocalciferol) 50000 units Caps capsule Commonly known as:  DRISDOL Take 1 capsule (50,000 Units total) by mouth 2 (two) times a week. Tuesday and Thursday            Discharge Care Instructions        Start     Ordered   02/27/17 0000  metoCLOPramide (REGLAN) 10 MG tablet  4 times daily    Question:  Supervising Provider  Answer:  Timmothy Euler   02/27/17 3546   02/27/17 0000  ALPRAZolam Duanne Moron) 0.5 MG tablet  At bedtime PRN    Question:  Supervising Provider  Answer:  Timmothy Euler   02/27/17 0952         Objective:    BP 107/74   Pulse 66   Temp 98.6 F (37 C) (Oral)   Ht 5' 4.5" (1.638 m)   Wt 188 lb (85.3 kg)   BMI 31.77 kg/m   Allergies  Allergen Reactions  . Other Itching, Nausea Only and Other (See Comments)    Acidic foods cause stomach upset and itching  . Allopurinol Rash  . Penicillins Rash    Name brand  . Sulfa Antibiotics Rash    Physical Exam  Constitutional: She is oriented to person, place, and time. She appears well-developed and well-nourished.  HENT:  Head: Normocephalic and atraumatic.  Right Ear: Tympanic membrane, external ear and ear canal normal.  Left Ear: Tympanic membrane, external ear and ear canal normal.  Nose: Nose normal. No rhinorrhea.  Mouth/Throat: Oropharynx is clear and moist and mucous membranes are normal. No oropharyngeal exudate or posterior oropharyngeal erythema.  Eyes: Pupils are equal, round, and reactive to light. Conjunctivae and EOM are normal.   Neck: Normal range of motion. Neck supple.  Cardiovascular: Normal rate, regular rhythm, normal heart sounds and intact distal pulses.   Pulmonary/Chest: Effort normal and breath sounds normal.  Abdominal: Soft. Bowel sounds are normal.  Neurological: She is alert and oriented to person, place, and time. She has normal reflexes.  Skin: Skin is warm and dry. No rash noted.  Psychiatric: She has a normal mood and affect. Her behavior is normal. Judgment and thought content normal.    Results for orders placed or performed in visit on 01/30/17  CMP14+EGFR  Result Value Ref Range   Glucose 107 (H) 65 - 99 mg/dL   BUN 26 8 - 27 mg/dL   Creatinine, Ser 1.21 (H) 0.57 - 1.00 mg/dL   GFR calc non Af Amer 47 (L) >59 mL/min/1.73   GFR calc Af Amer 54 (L) >59 mL/min/1.73   BUN/Creatinine Ratio 21 12 - 28   Sodium 140 134 - 144 mmol/L   Potassium 4.7 3.5 - 5.2 mmol/L   Chloride 102 96 - 106 mmol/L   CO2 23 20 - 29 mmol/L   Calcium 10.0 8.7 - 10.3 mg/dL   Total Protein 7.3 6.0 - 8.5 g/dL   Albumin 4.3 3.6 - 4.8 g/dL   Globulin, Total 3.0 1.5 - 4.5 g/dL   Albumin/Globulin Ratio 1.4 1.2 - 2.2   Bilirubin Total 0.2 0.0 - 1.2 mg/dL   Alkaline Phosphatase 138 (H) 39 - 117 IU/L   AST 24 0 - 40 IU/L   ALT 12 0 - 32 IU/L  CBC with Differential/Platelet  Result Value Ref Range   WBC 5.3 3.4 - 10.8 x10E3/uL   RBC 4.24 3.77 - 5.28 x10E6/uL   Hemoglobin 10.8 (L) 11.1 - 15.9 g/dL  Hematocrit 32.5 (L) 34.0 - 46.6 %   MCV 77 (L) 79 - 97 fL   MCH 25.5 (L) 26.6 - 33.0 pg   MCHC 33.2 31.5 - 35.7 g/dL   RDW 13.7 12.3 - 15.4 %   Platelets 234 150 - 379 x10E3/uL   Neutrophils 64 Not Estab. %   Lymphs 28 Not Estab. %   Monocytes 7 Not Estab. %   Eos 1 Not Estab. %   Basos 0 Not Estab. %   Neutrophils Absolute 3.4 1.4 - 7.0 x10E3/uL   Lymphocytes Absolute 1.5 0.7 - 3.1 x10E3/uL   Monocytes Absolute 0.3 0.1 - 0.9 x10E3/uL   EOS (ABSOLUTE) 0.1 0.0 - 0.4 x10E3/uL   Basophils Absolute 0.0 0.0 - 0.2  x10E3/uL   Immature Granulocytes 0 Not Estab. %   Immature Grans (Abs) 0.0 0.0 - 0.1 x10E3/uL  Lipid panel  Result Value Ref Range   Cholesterol, Total 163 100 - 199 mg/dL   Triglycerides 154 (H) 0 - 149 mg/dL   HDL 34 (L) >39 mg/dL   VLDL Cholesterol Cal 31 5 - 40 mg/dL   LDL Calculated 98 0 - 99 mg/dL   Chol/HDL Ratio 4.8 (H) 0.0 - 4.4 ratio  TSH  Result Value Ref Range   TSH 0.265 (L) 0.450 - 4.500 uIU/mL  Bayer DCA Hb A1c Waived  Result Value Ref Range   Bayer DCA Hb A1c Waived 6.6 <7.0 %  Microalbumin / creatinine urine ratio  Result Value Ref Range   Creatinine, Urine 185.0 Not Estab. mg/dL   Albumin, Urine 16.2 Not Estab. ug/mL   Microalb/Creat Ratio 8.8 0.0 - 30.0 mg/g creat      Assessment & Plan:   1. Generalized anxiety disorder - ALPRAZolam (XANAX) 0.5 MG tablet; Take 1 tablet (0.5 mg total) by mouth at bedtime as needed for anxiety.  Dispense: 90 tablet; Refill: 1  2. Irritable bowel syndrome with constipation - metoCLOPramide (REGLAN) 10 MG tablet; Take 1 tablet (10 mg total) by mouth 4 (four) times daily.  Dispense: 120 tablet; Refill: 2  3. Essential hypertension  4. Mild intermittent asthma, unspecified whether complicated    Current Outpatient Prescriptions:  .  albuterol (PROVENTIL HFA;VENTOLIN HFA) 108 (90 Base) MCG/ACT inhaler, Inhale 2 puffs into the lungs 2 (two) times daily as needed for wheezing or shortness of breath., Disp: 18 g, Rfl: 6 .  ALPRAZolam (XANAX) 0.5 MG tablet, Take 1 tablet (0.5 mg total) by mouth at bedtime as needed for anxiety., Disp: 90 tablet, Rfl: 1 .  alum & mag hydroxide-simeth (MAALOX/MYLANTA) 200-200-20 MG/5ML suspension, Take 30 mLs by mouth every 6 (six) hours as needed for indigestion or heartburn., Disp: , Rfl:  .  aspirin EC 81 MG tablet, Take 81 mg by mouth daily., Disp: , Rfl:  .  atorvastatin (LIPITOR) 20 MG tablet, Take 20 mg by mouth every evening., Disp: , Rfl: 4 .  BIOFLAVONOIDS PO, Take 2 tablets by mouth 2  (two) times daily., Disp: , Rfl:  .  budesonide-formoterol (SYMBICORT) 160-4.5 MCG/ACT inhaler, Inhale 2 puffs into the lungs 2 (two) times daily., Disp: 3 Inhaler, Rfl: 3 .  cetirizine (ZYRTEC) 10 MG tablet, Take 10 mg by mouth daily., Disp: , Rfl:  .  citalopram (CELEXA) 20 MG tablet, Take 2 tablets (40 mg total) by mouth daily., Disp: 180 tablet, Rfl: 0 .  cloNIDine (CATAPRES) 0.2 MG tablet, Take 1 tablet (0.2 mg total) by mouth 2 (two) times daily., Disp: 60 tablet, Rfl: 11 .  cromolyn (NASALCROM) 5.2 MG/ACT nasal spray, Place 1 spray into both nostrils daily as needed for allergies., Disp: , Rfl:  .  dextromethorphan-guaiFENesin (MUCINEX DM) 30-600 MG 12hr tablet, Take 1 tablet by mouth 2 (two) times daily., Disp: , Rfl:  .  docusate sodium (COLACE) 100 MG capsule, Take 100 mg by mouth daily., Disp: , Rfl:  .  folic acid (FOLVITE) 620 MCG tablet, Take 400 mcg by mouth daily., Disp: , Rfl:  .  furosemide (LASIX) 40 MG tablet, TAKE 1 TABLET BY MOUTH EVERY DAY, Disp: 30 tablet, Rfl: 5 .  hydrALAZINE (APRESOLINE) 25 MG tablet, Take 0.5 tablets (12.5 mg total) by mouth 3 (three) times daily. FOR BLOOD PRESSURE, Disp: 90 tablet, Rfl: 0 .  hydrOXYzine (ATARAX/VISTARIL) 10 MG tablet, TAKE 1 TABLET BY MOUTH 3 TIMES DAILY FOR SEVERE ITCHING (Patient taking differently: TAKE 0.5 TABLET BY MOUTH 3 TIMES DAILY FOR SEVERE ITCHING), Disp: 90 tablet, Rfl: 1 .  Lactobacillus-Inulin ([place] PO), Take by mouth., Disp: , Rfl:  .  levothyroxine (SYNTHROID, LEVOTHROID) 75 MCG tablet, Take 1 tablet (75 mcg total) by mouth daily., Disp: 30 tablet, Rfl: 11 .  Magnesium 400 MG TABS, Take 400 mg by mouth daily., Disp: , Rfl:  .  metoprolol tartrate (LOPRESSOR) 25 MG tablet, Take 0.5 tablets (12.5 mg total) by mouth 2 (two) times daily., Disp: 30 tablet, Rfl: 6 .  montelukast (SINGULAIR) 10 MG tablet, TAKE 1 TABLET IN THE EVENING ONCE A DAY ORALLY 30 DAYS ORAL, Disp: 90 tablet, Rfl: 0 .  Multiple  Vitamins-Minerals (ALIVE ONCE DAILY WOMENS PO), Take 1 tablet by mouth daily., Disp: , Rfl:  .  omeprazole (PRILOSEC) 40 MG capsule, Take 40 mg by mouth daily., Disp: , Rfl:  .  pioglitazone (ACTOS) 30 MG tablet, Take 1 tablet (30 mg total) by mouth daily., Disp: 90 tablet, Rfl: 0 .  quinapril (ACCUPRIL) 40 MG tablet, Take 1 tablet (40 mg total) by mouth daily., Disp: 30 tablet, Rfl: 11 .  Vitamin D, Ergocalciferol, (DRISDOL) 50000 units CAPS capsule, Take 1 capsule (50,000 Units total) by mouth 2 (two) times a week. Tuesday and Thursday, Disp: 30 capsule, Rfl: 6 .  metoCLOPramide (REGLAN) 10 MG tablet, Take 1 tablet (10 mg total) by mouth 4 (four) times daily., Disp: 120 tablet, Rfl: 2 Continue all other maintenance medications as listed above.  Follow up plan: Return in about 3 months (around 05/29/2017) for recheck.  Educational handout given for [place] PA-C [place] [address]  [place], [place] [address] [phone]   02/27/2017, 11:50 AM

## 2017-02-27 NOTE — Patient Instructions (Addendum)
In a few days you may receive a survey in the mail or online from [employer] regarding your visit with [place] today. Please take a moment to fill this out. Your feedback is very important to our whole office. It can help [place] better understand your needs as well as improve your experience and satisfaction. Thank you for taking your time to complete it. We care about you.  Particia Nearing, PA-C   Dr Wendi Snipes

## 2017-03-19 ENCOUNTER — Other Ambulatory Visit: Payer: Self-pay | Admitting: *Deleted

## 2017-03-19 MED ORDER — GLUCOSE BLOOD VI STRP: ORAL_STRIP | 12 refills | Status: DC

## 2017-03-26 ENCOUNTER — Ambulatory Visit: Payer: Medicare Other

## 2017-04-05 ENCOUNTER — Other Ambulatory Visit: Payer: Self-pay | Admitting: Physician Assistant

## 2017-04-06 ENCOUNTER — Other Ambulatory Visit: Payer: Self-pay | Admitting: Physician Assistant

## 2017-04-06 DIAGNOSIS — E119 Type 2 diabetes mellitus without complications: Secondary | ICD-10-CM

## 2017-04-06 DIAGNOSIS — F411 Generalized anxiety disorder: Secondary | ICD-10-CM

## 2017-04-13 ENCOUNTER — Other Ambulatory Visit: Payer: Self-pay

## 2017-04-13 DIAGNOSIS — I10 Essential (primary) hypertension: Secondary | ICD-10-CM

## 2017-04-13 MED ORDER — LEVOTHYROXINE SODIUM 75 MCG PO TABS: 75.0000 ug | ORAL_TABLET | Freq: Every day | ORAL | 2 refills | Status: DC

## 2017-04-13 MED ORDER — HYDRALAZINE HCL 25 MG PO TABS: 12.5000 mg | ORAL_TABLET | Freq: Three times a day (TID) | ORAL | 3 refills | Status: DC

## 2017-04-13 MED ORDER — VITAMIN D (ERGOCALCIFEROL) 1.25 MG (50000 UNIT) PO CAPS: 50000.0000 [IU] | ORAL_CAPSULE | ORAL | 3 refills | Status: DC

## 2017-04-13 NOTE — Telephone Encounter (Signed)
No Vit D in EPIC 

## 2017-04-15 ENCOUNTER — Other Ambulatory Visit: Payer: Self-pay | Admitting: Physician Assistant

## 2017-04-17 ENCOUNTER — Other Ambulatory Visit: Payer: Self-pay | Admitting: *Deleted

## 2017-04-17 ENCOUNTER — Other Ambulatory Visit: Payer: Self-pay | Admitting: Physician Assistant

## 2017-04-17 DIAGNOSIS — K581 Irritable bowel syndrome with constipation: Secondary | ICD-10-CM

## 2017-04-17 DIAGNOSIS — I10 Essential (primary) hypertension: Secondary | ICD-10-CM

## 2017-04-17 MED ORDER — HYDROXYZINE HCL 10 MG PO TABS: ORAL_TABLET | ORAL | 0 refills | Status: DC

## 2017-04-17 MED ORDER — FUROSEMIDE 40 MG PO TABS: 40.0000 mg | ORAL_TABLET | Freq: Every day | ORAL | 0 refills | Status: DC

## 2017-04-17 MED ORDER — METOCLOPRAMIDE HCL 10 MG PO TABS: 10.0000 mg | ORAL_TABLET | Freq: Four times a day (QID) | ORAL | 0 refills | Status: DC

## 2017-04-17 MED ORDER — CLONIDINE HCL 0.2 MG PO TABS: 0.2000 mg | ORAL_TABLET | Freq: Two times a day (BID) | ORAL | 3 refills | Status: DC

## 2017-04-17 NOTE — Addendum Note (Signed)
Addended by: Antonietta Barcelona D on: 04/17/2017 10:42 AM   Modules accepted: Orders

## 2017-05-11 ENCOUNTER — Encounter: Payer: Self-pay | Admitting: Cardiovascular Disease

## 2017-05-11 ENCOUNTER — Ambulatory Visit: Payer: Medicare Other | Admitting: Cardiovascular Disease

## 2017-05-11 VITALS — BP 120/74 | HR 65 | Ht 64.0 in | Wt 189.0 lb

## 2017-05-11 DIAGNOSIS — R002 Palpitations: Secondary | ICD-10-CM | POA: Diagnosis not present

## 2017-05-11 DIAGNOSIS — E785 Hyperlipidemia, unspecified: Secondary | ICD-10-CM | POA: Diagnosis not present

## 2017-05-11 DIAGNOSIS — I10 Essential (primary) hypertension: Secondary | ICD-10-CM

## 2017-05-11 DIAGNOSIS — I340 Nonrheumatic mitral (valve) insufficiency: Secondary | ICD-10-CM | POA: Diagnosis not present

## 2017-05-11 DIAGNOSIS — I38 Endocarditis, valve unspecified: Secondary | ICD-10-CM | POA: Diagnosis not present

## 2017-05-11 MED ORDER — METOPROLOL SUCCINATE ER 25 MG PO TB24: 25.0000 mg | ORAL_TABLET | ORAL | 11 refills | Status: DC

## 2017-05-11 NOTE — Progress Notes (Signed)
SUBJECTIVE: The patient presents for follow-up of valvular heart disease, hypertension, palpitations, and hyperlipidemia.  She then underwent a subsequent echocardiogram on 01/26/16 which demonstrated normal left ventricular systolic function, LVEF 57-84%, grade 2 diastolic dysfunction with increased LV filling pressures, moderate mitral regurgitation, and mild to moderate tricuspid regurgitation.  She continues to take metoprolol but had difficulty splitting the 25 mg tablets.  She started taking a full 25 mg tablet every morning and felt better.  She seldom has palpitations.  She denies chest pain.    Review of Systems: As per "subjective", otherwise negative.  Allergies  Allergen Reactions  . Other Itching, Nausea Only and Other (See Comments)    Acidic foods cause stomach upset and itching  . Allopurinol Rash  . Penicillins Rash    Name brand  . Sulfa Antibiotics Rash    Current Outpatient Medications  Medication Sig Dispense Refill  . albuterol (PROVENTIL HFA;VENTOLIN HFA) 108 (90 Base) MCG/ACT inhaler Inhale 2 puffs into the lungs 2 (two) times daily as needed for wheezing or shortness of breath. 18 g 6  . ALPRAZolam (XANAX) 0.5 MG tablet Take 1 tablet (0.5 mg total) by mouth at bedtime as needed for anxiety. 90 tablet 1  . alum & mag hydroxide-simeth (MAALOX/MYLANTA) 200-200-20 MG/5ML suspension Take 30 mLs by mouth every 6 (six) hours as needed for indigestion or heartburn.    [name] aspirin EC 81 MG tablet Take 81 mg by mouth daily.    [name] atorvastatin (LIPITOR) 20 MG tablet Take 20 mg by mouth every evening.  4  . BIOFLAVONOIDS PO Take 2 tablets by mouth 2 (two) times daily.    . budesonide-formoterol (SYMBICORT) 160-4.5 MCG/ACT inhaler Inhale 2 puffs into the lungs 2 (two) times daily. 3 Inhaler 3  . cetirizine (ZYRTEC) 10 MG tablet Take 10 mg by mouth daily.    . citalopram (CELEXA) 20 MG tablet TAKE 2 TABLETS BY MOUTH EVERY DAY 180 tablet 0  . cloNIDine (CATAPRES) 0.2 MG  tablet Take 1 tablet (0.2 mg total) 2 (two) times daily by mouth. 180 tablet 3  . cromolyn (NASALCROM) 5.2 MG/ACT nasal spray Place 1 spray into both nostrils daily as needed for allergies.    [name] dextromethorphan-guaiFENesin (MUCINEX DM) 30-600 MG 12hr tablet Take 1 tablet by mouth 2 (two) times daily.    [name] docusate sodium (COLACE) 100 MG capsule Take 100 mg by mouth daily.    . folic acid (FOLVITE) 696 MCG tablet Take 400 mcg by mouth daily.    . furosemide (LASIX) 40 MG tablet Take 1 tablet (40 mg total) daily by mouth. 90 tablet 0  . glucose blood test strip Test blood sugars twice a day 100 each 12  . hydrALAZINE (APRESOLINE) 25 MG tablet Take 0.5 tablets (12.5 mg total) by mouth 3 (three) times daily. FOR BLOOD PRESSURE 90 tablet 3  . hydrOXYzine (ATARAX/VISTARIL) 10 MG tablet TAKE 0.5 TABLET BY MOUTH 3 TIMES DAILY FOR SEVERE ITCHING 135 tablet 0  . Lactobacillus-Inulin ([place] PO) Take by mouth.    . levothyroxine (SYNTHROID, LEVOTHROID) 75 MCG tablet Take 1 tablet (75 mcg total) by mouth daily. 90 tablet 2  . Magnesium 400 MG TABS Take 400 mg by mouth daily.    . metoCLOPramide (REGLAN) 10 MG tablet Take 1 tablet (10 mg total) 4 (four) times daily by mouth. 360 tablet 0  . montelukast (SINGULAIR) 10 MG tablet TAKE 1 TABLET IN THE EVENING ONCE A DAY ORALLY 30  DAYS ORAL 90 tablet 0  . Multiple Vitamins-Minerals (ALIVE ONCE DAILY WOMENS PO) Take 1 tablet by mouth daily.    [name] omeprazole (PRILOSEC) 40 MG capsule Take 40 mg by mouth daily.    . pioglitazone (ACTOS) 30 MG tablet TAKE 1 TABLET BY MOUTH EVERY DAY 90 tablet 0  . quinapril (ACCUPRIL) 40 MG tablet Take 1 tablet (40 mg total) by mouth daily. 30 tablet 11  . Vitamin D, Ergocalciferol, (DRISDOL) 50000 units CAPS capsule Take 1 capsule (50,000 Units total) by mouth 2 (two) times a week. Tuesday and Thursday 30 capsule 3  . metoprolol tartrate (LOPRESSOR) 25 MG tablet Take 0.5 tablets (12.5 mg total) by mouth 2 (two)  times daily. 30 tablet 6   No current facility-administered medications for this visit.     Past Medical History:  Diagnosis Date  . Allergic rhinitis   . Allergy   . Anemia   . Ankle swelling   . Anxiety    takes Xanax daily   . Arthritis   . Asthma   . Blurred vision, bilateral   . Chronic kidney disease   . Depression    takes Celexa daily  . Diabetes mellitus without complication ([place])    type II; takes Actos daily  . GERD (gastroesophageal reflux disease)   . Gout   . Heart murmur   . Heart palpitations   . Hemorrhoids   . History of blood transfusion    no abnormal reaction noted  . History of bronchitis   . Hyperlipidemia   . Hypertension    takes Catapres and Quinapril daily  . Hypothyroidism    takes Synthroid daily  . Moderate mitral insufficiency    a. 12/2014 Echo: EF 55-60%, mod MR, mildly to moderately dil LA, PASP 71mmHg.  . Osteoarthritis   . Paroxysmal supraventricular tachycardia ([place])   . Ringing in ears   . Shortness of breath dyspnea    ALbuterol daily as needed  . Stevens-Johnson disease ([place])   . Urinary incontinence     Past Surgical History:  Procedure Laterality Date  . ABDOMINAL HYSTERECTOMY    . BOWEL RESECTION    . COLON SURGERY    . COLONOSCOPY    . ESOPHAGOGASTRODUODENOSCOPY    . HERNIA REPAIR     hiatal hernia repair  . TOTAL HIP ARTHROPLASTY Left 02/02/2015   Procedure: LEFT TOTAL HIP ARTHROPLASTY;  Surgeon: Garald Balding, MD;  Location: [facility];  Service: Orthopedics;  Laterality: Left;  . TOTAL HIP ARTHROPLASTY Right 02/01/2016   Procedure: TOTAL HIP ARTHROPLASTY;  Surgeon: Garald Balding, MD;  Location: [facility];  Service: Orthopedics;  Laterality: Right;    Social History   Socioeconomic History  . Marital status: Single    Spouse name: Not on file  . Number of children: Not on file  . Years of education: Not on file  . Highest education level: Not on file  Social Needs  . Financial resource strain: Not on file    . Food insecurity - worry: Not on file  . Food insecurity - inability: Not on file  . Transportation needs - medical: Not on file  . Transportation needs - non-medical: Not on file  Occupational History  . Not on file  Tobacco Use  . Smoking status: Never Smoker  . Smokeless tobacco: Never Used  Substance and Sexual Activity  . Alcohol use: No    Alcohol/week: 0.0 oz  . Drug use: No  . Sexual activity: Not  on file  Other Topics Concern  . Not on file  Social History Narrative  . Not on file     Vitals:   05/11/17 0955  BP: 120/74  Pulse: 65  SpO2: 96%  Weight: 189 lb (85.7 kg)  Height: 5\' 4"  (1.626 m)    Wt Readings from Last 3 Encounters:  05/11/17 189 lb (85.7 kg)  02/27/17 188 lb (85.3 kg)  02/08/17 185 lb (83.9 kg)     PHYSICAL EXAM General: NAD HEENT: Normal. Neck: No JVD, no thyromegaly. Lungs: Clear to auscultation bilaterally with normal respiratory effort. CV: Regular rate and rhythm, normal S1/S2, no S3/S4, no murmur. No pretibial or periankle edema. Abdomen: Soft, nontender, no distention.  Neurologic: Alert and oriented.  Psych: Normal affect. Skin: Normal. Musculoskeletal: No gross deformities.    ECG: Most recent ECG reviewed.   Labs: Lab Results  Component Value Date/Time   K 4.7 01/30/2017 11:59 AM   BUN 26 01/30/2017 11:59 AM   CREATININE 1.21 (H) 01/30/2017 11:59 AM   ALT 12 01/30/2017 11:59 AM   TSH 0.265 (L) 01/30/2017 11:59 AM   HGB 10.8 (L) 01/30/2017 11:59 AM     Lipids: Lab Results  Component Value Date/Time   LDLCALC 98 01/30/2017 11:59 AM   CHOL 163 01/30/2017 11:59 AM   TRIG 154 (H) 01/30/2017 11:59 AM   HDL 34 (L) 01/30/2017 11:59 AM       ASSESSMENT AND PLAN: 1. Valvular heart disease with moderate mitral and mild to moderate tricuspid regurgitation: Noted on echocardiogram in August 2017 as detailed above.   Symptomatically stable.  2. Hypertension: Controlled.  No changes to therapy.  3. Palpitations:  Likely exacerbated by iatrogenic hyperthyroidism when I last saw her.  She feels better taking 25 mg of metoprolol every morning.  I will switch tartrate to metoprolol succinate 25 mg every morning.  4. Hyperlipidemia: On Lipitor 20 mg. Lipids on 01/30/17 showed total cholesterol 163, triglycerides 154, HDL 34, LDL 98. No changes.      Disposition: Follow up 1 year   Kate Sable, M.D., F.A.C.C.

## 2017-05-11 NOTE — Patient Instructions (Addendum)
Medication Instructions:   Stop Lopressor.  Begin Toprol XL 25mg  every morning.    Continue all other medications.    Labwork: none  Testing/Procedures: none  Follow-Up: Your physician wants you to follow up in:  1 year.  You will receive a reminder letter in the mail one-two months in advance.  If you don't receive a letter, please call our office to schedule the follow up appointment   Any Other Special Instructions Will Be Listed Below (If Applicable).  If you need a refill on your cardiac medications before your next appointment, please call your pharmacy.

## 2017-05-30 ENCOUNTER — Encounter: Payer: Self-pay | Admitting: Physician Assistant

## 2017-05-30 ENCOUNTER — Ambulatory Visit (INDEPENDENT_AMBULATORY_CARE_PROVIDER_SITE_OTHER): Payer: Medicare Other | Admitting: Physician Assistant

## 2017-05-30 DIAGNOSIS — I10 Essential (primary) hypertension: Secondary | ICD-10-CM

## 2017-05-30 DIAGNOSIS — J4520 Mild intermittent asthma, uncomplicated: Secondary | ICD-10-CM | POA: Diagnosis not present

## 2017-05-30 MED ORDER — ALBUTEROL SULFATE HFA 108 (90 BASE) MCG/ACT IN AERS: 2.0000 | INHALATION_SPRAY | Freq: Two times a day (BID) | RESPIRATORY_TRACT | 6 refills | Status: DC | PRN

## 2017-05-30 MED ORDER — UMECLIDINIUM-VILANTEROL 62.5-25 MCG/INH IN AEPB: 1.0000 | INHALATION_SPRAY | Freq: Every day | RESPIRATORY_TRACT | 5 refills | Status: DC

## 2017-05-30 MED ORDER — HYDRALAZINE HCL 25 MG PO TABS: 12.5000 mg | ORAL_TABLET | Freq: Three times a day (TID) | ORAL | 3 refills | Status: DC

## 2017-05-30 NOTE — Patient Instructions (Signed)
In a few days you may receive a survey in the mail or online from [employer] regarding your visit with us today. Please take a moment to fill this out. Your feedback is very important to our whole office. It can help us better understand your needs as well as improve your experience and satisfaction. Thank you for taking your time to complete it. We care about you.  Jerid Catherman, PA-C  

## 2017-05-31 NOTE — Progress Notes (Signed)
BP 101/68   Pulse 85   Temp 98.3 F (36.8 C) (Oral)   Ht 5\' 4"  (1.626 m)   Wt 193 lb 3.2 oz (87.6 kg)   BMI 33.16 kg/m    Subjective:    Patient ID: [name], female    DOB: [birth year], 66 y.o.   MRN: [id number]  HPI: [name] is a 66 y.o. female presenting on 05/30/2017 for Follow-up (3 month )  Patient comes in for 40-month recheck.  States overall she is fairly stable.  Her joints seem to be doing very well.  Her blood pressure is very well controlled.  She is still having some difficulty with the Symbicort giving her irritation in her throat.  We are going to try to get her off of any inhaled steroid and still try to get medication to reduce her asthma symptoms. She does have hypertension and asthma.  She also has significant osteoarthritis in the knee joints.  Relevant past medical, surgical, family and social history reviewed and updated as indicated. Allergies and medications reviewed and updated.  Past Medical History:  Diagnosis Date  . Allergic rhinitis   . Allergy   . Anemia   . Ankle swelling   . Anxiety    takes Xanax daily   . Arthritis   . Asthma   . Blurred vision, bilateral   . Chronic kidney disease   . Depression    takes Celexa daily  . Diabetes mellitus without complication ([place])    type II; takes Actos daily  . GERD (gastroesophageal reflux disease)   . Gout   . Heart murmur   . Heart palpitations   . Hemorrhoids   . History of blood transfusion    no abnormal reaction noted  . History of bronchitis   . Hyperlipidemia   . Hypertension    takes Catapres and Quinapril daily  . Hypothyroidism    takes Synthroid daily  . Moderate mitral insufficiency    a. 12/2014 Echo: EF 55-60%, mod MR, mildly to moderately dil LA, PASP 4mmHg.  . Osteoarthritis   . Paroxysmal supraventricular tachycardia ([place])   . Ringing in ears   . Shortness of breath dyspnea    ALbuterol daily as needed  . Stevens-Johnson disease ([place])   . Urinary  incontinence     Past Surgical History:  Procedure Laterality Date  . ABDOMINAL HYSTERECTOMY    . BOWEL RESECTION    . COLON SURGERY    . COLONOSCOPY    . ESOPHAGOGASTRODUODENOSCOPY    . HERNIA REPAIR     hiatal hernia repair  . TOTAL HIP ARTHROPLASTY Left 02/02/2015   Procedure: LEFT TOTAL HIP ARTHROPLASTY;  Surgeon: Garald Balding, MD;  Location: [facility];  Service: Orthopedics;  Laterality: Left;  . TOTAL HIP ARTHROPLASTY Right 02/01/2016   Procedure: TOTAL HIP ARTHROPLASTY;  Surgeon: Garald Balding, MD;  Location: [facility];  Service: Orthopedics;  Laterality: Right;    Review of Systems  Constitutional: Negative.  Negative for activity change, fatigue and fever.  HENT: Negative.   Eyes: Negative.   Respiratory: Positive for shortness of breath and wheezing. Negative for cough.   Cardiovascular: Negative.  Negative for chest pain.  Gastrointestinal: Negative.  Negative for abdominal pain.  Endocrine: Negative.   Genitourinary: Negative.  Negative for dysuria.  Musculoskeletal: Positive for arthralgias and gait problem.  Skin: Negative.     Allergies as of 05/30/2017      Reactions   Other Itching, Nausea  Only, Other (See Comments)   Acidic foods cause stomach upset and itching   Allopurinol Rash   Penicillins Rash   Name brand   Sulfa Antibiotics Rash      Medication List        Accurate as of 05/30/17 11:59 PM. Always use your most recent med list.          albuterol 108 (90 Base) MCG/ACT inhaler Commonly known as:  PROVENTIL HFA;VENTOLIN HFA Inhale 2 puffs into the lungs 2 (two) times daily as needed for wheezing or shortness of breath.   ALIVE ONCE DAILY WOMENS PO Take 1 tablet by mouth daily.   ALPRAZolam 0.5 MG tablet Commonly known as:  XANAX Take 1 tablet (0.5 mg total) by mouth at bedtime as needed for anxiety.   alum & mag hydroxide-simeth 200-200-20 MG/5ML suspension Commonly known as:  MAALOX/MYLANTA Take 30 mLs by mouth every 6 (six) hours as  needed for indigestion or heartburn.   aspirin EC 81 MG tablet Take 81 mg by mouth daily.   atorvastatin 20 MG tablet Commonly known as:  LIPITOR Take 20 mg by mouth every evening.   BIOFLAVONOIDS PO Take 2 tablets by mouth 2 (two) times daily.   budesonide-formoterol 160-4.5 MCG/ACT inhaler Commonly known as:  SYMBICORT Inhale 2 puffs into the lungs 2 (two) times daily.   cetirizine 10 MG tablet Commonly known as:  ZYRTEC Take 10 mg by mouth daily.   citalopram 20 MG tablet Commonly known as:  CELEXA TAKE 2 TABLETS BY MOUTH EVERY DAY   cloNIDine 0.2 MG tablet Commonly known as:  CATAPRES Take 1 tablet (0.2 mg total) 2 (two) times daily by mouth.   cromolyn 5.2 MG/ACT nasal spray Commonly known as:  NASALCROM Place 1 spray into both nostrils daily as needed for allergies.   Strong PO Take by mouth.   dextromethorphan-guaiFENesin 30-600 MG 12hr tablet Commonly known as:  MUCINEX DM Take 1 tablet by mouth 2 (two) times daily.   docusate sodium 100 MG capsule Commonly known as:  COLACE Take 100 mg by mouth daily.   folic acid 254 MCG tablet Commonly known as:  FOLVITE Take 400 mcg by mouth daily.   furosemide 40 MG tablet Commonly known as:  LASIX Take 1 tablet (40 mg total) daily by mouth.   glucose blood test strip Test blood sugars twice a day   hydrALAZINE 25 MG tablet Commonly known as:  APRESOLINE Take 0.5 tablets (12.5 mg total) by mouth 3 (three) times daily. FOR BLOOD PRESSURE   hydrOXYzine 10 MG tablet Commonly known as:  ATARAX/VISTARIL TAKE 0.5 TABLET BY MOUTH 3 TIMES DAILY FOR SEVERE ITCHING   levothyroxine 75 MCG tablet Commonly known as:  SYNTHROID, LEVOTHROID Take 1 tablet (75 mcg total) by mouth daily.   Magnesium 400 MG Tabs Take 400 mg by mouth daily.   metoCLOPramide 10 MG tablet Commonly known as:  REGLAN Take 1 tablet (10 mg total) 4 (four) times daily by mouth.   metoprolol succinate 25 MG 24 hr  tablet Commonly known as:  TOPROL XL Take 1 tablet (25 mg total) by mouth every morning.   montelukast 10 MG tablet Commonly known as:  SINGULAIR TAKE 1 TABLET IN THE EVENING ONCE A DAY ORALLY 30 DAYS ORAL   omeprazole 40 MG capsule Commonly known as:  PRILOSEC Take 40 mg by mouth daily.   pioglitazone 30 MG tablet Commonly known as:  ACTOS TAKE 1 TABLET BY MOUTH EVERY DAY  quinapril 40 MG tablet Commonly known as:  ACCUPRIL Take 1 tablet (40 mg total) by mouth daily.   umeclidinium-vilanterol 62.5-25 MCG/INH Aepb Commonly known as:  ANORO ELLIPTA Inhale 1 puff into the lungs daily.   Vitamin D (Ergocalciferol) 50000 units Caps capsule Commonly known as:  DRISDOL Take 1 capsule (50,000 Units total) by mouth 2 (two) times a week. Tuesday and Thursday          Objective:    BP 101/68   Pulse 85   Temp 98.3 F (36.8 C) (Oral)   Ht 5\' 4"  (1.626 m)   Wt 193 lb 3.2 oz (87.6 kg)   BMI 33.16 kg/m   Allergies  Allergen Reactions  . Other Itching, Nausea Only and Other (See Comments)    Acidic foods cause stomach upset and itching  . Allopurinol Rash  . Penicillins Rash    Name brand  . Sulfa Antibiotics Rash    Physical Exam  Constitutional: She is oriented to person, place, and time. She appears well-developed and well-nourished.  HENT:  Head: Normocephalic and atraumatic.  Right Ear: Tympanic membrane, external ear and ear canal normal.  Left Ear: Tympanic membrane, external ear and ear canal normal.  Nose: Nose normal. No rhinorrhea.  Mouth/Throat: Oropharynx is clear and moist and mucous membranes are normal. No oropharyngeal exudate or posterior oropharyngeal erythema.  Eyes: Conjunctivae and EOM are normal. Pupils are equal, round, and reactive to light.  Neck: Normal range of motion. Neck supple.  Cardiovascular: Normal rate, regular rhythm, normal heart sounds and intact distal pulses.  Pulmonary/Chest: Effort normal and breath sounds normal.   Abdominal: Soft. Bowel sounds are normal.  Neurological: She is alert and oriented to person, place, and time. She has normal reflexes.  Skin: Skin is warm and dry. No rash noted.  Psychiatric: She has a normal mood and affect. Her behavior is normal. Judgment and thought content normal.        Assessment & Plan:   1. Essential hypertension - hydrALAZINE (APRESOLINE) 25 MG tablet; Take 0.5 tablets (12.5 mg total) by mouth 3 (three) times daily. FOR BLOOD PRESSURE  Dispense: 270 tablet; Refill: 3  2. Mild intermittent asthma, unspecified whether complicated - umeclidinium-vilanterol (ANORO ELLIPTA) 62.5-25 MCG/INH AEPB; Inhale 1 puff into the lungs daily.  Dispense: 60 each; Refill: 5 - albuterol (PROVENTIL HFA;VENTOLIN HFA) 108 (90 Base) MCG/ACT inhaler; Inhale 2 puffs into the lungs 2 (two) times daily as needed for wheezing or shortness of breath.  Dispense: 18 g; Refill: 6    Current Outpatient Medications:  .  albuterol (PROVENTIL HFA;VENTOLIN HFA) 108 (90 Base) MCG/ACT inhaler, Inhale 2 puffs into the lungs 2 (two) times daily as needed for wheezing or shortness of breath., Disp: 18 g, Rfl: 6 .  ALPRAZolam (XANAX) 0.5 MG tablet, Take 1 tablet (0.5 mg total) by mouth at bedtime as needed for anxiety., Disp: 90 tablet, Rfl: 1 .  alum & mag hydroxide-simeth (MAALOX/MYLANTA) 200-200-20 MG/5ML suspension, Take 30 mLs by mouth every 6 (six) hours as needed for indigestion or heartburn., Disp: , Rfl:  .  aspirin EC 81 MG tablet, Take 81 mg by mouth daily., Disp: , Rfl:  .  atorvastatin (LIPITOR) 20 MG tablet, Take 20 mg by mouth every evening., Disp: , Rfl: 4 .  BIOFLAVONOIDS PO, Take 2 tablets by mouth 2 (two) times daily., Disp: , Rfl:  .  budesonide-formoterol (SYMBICORT) 160-4.5 MCG/ACT inhaler, Inhale 2 puffs into the lungs 2 (two) times daily., Disp: 3  Inhaler, Rfl: 3 .  cetirizine (ZYRTEC) 10 MG tablet, Take 10 mg by mouth daily., Disp: , Rfl:  .  citalopram (CELEXA) 20 MG tablet,  TAKE 2 TABLETS BY MOUTH EVERY DAY, Disp: 180 tablet, Rfl: 0 .  cloNIDine (CATAPRES) 0.2 MG tablet, Take 1 tablet (0.2 mg total) 2 (two) times daily by mouth., Disp: 180 tablet, Rfl: 3 .  cromolyn (NASALCROM) 5.2 MG/ACT nasal spray, Place 1 spray into both nostrils daily as needed for allergies., Disp: , Rfl:  .  dextromethorphan-guaiFENesin (MUCINEX DM) 30-600 MG 12hr tablet, Take 1 tablet by mouth 2 (two) times daily., Disp: , Rfl:  .  docusate sodium (COLACE) 100 MG capsule, Take 100 mg by mouth daily., Disp: , Rfl:  .  folic acid (FOLVITE) 027 MCG tablet, Take 400 mcg by mouth daily., Disp: , Rfl:  .  furosemide (LASIX) 40 MG tablet, Take 1 tablet (40 mg total) daily by mouth., Disp: 90 tablet, Rfl: 0 .  glucose blood test strip, Test blood sugars twice a day, Disp: 100 each, Rfl: 12 .  hydrALAZINE (APRESOLINE) 25 MG tablet, Take 0.5 tablets (12.5 mg total) by mouth 3 (three) times daily. FOR BLOOD PRESSURE, Disp: 270 tablet, Rfl: 3 .  hydrOXYzine (ATARAX/VISTARIL) 10 MG tablet, TAKE 0.5 TABLET BY MOUTH 3 TIMES DAILY FOR SEVERE ITCHING, Disp: 135 tablet, Rfl: 0 .  Lactobacillus-Inulin ([place] PO), Take by mouth., Disp: , Rfl:  .  levothyroxine (SYNTHROID, LEVOTHROID) 75 MCG tablet, Take 1 tablet (75 mcg total) by mouth daily., Disp: 90 tablet, Rfl: 2 .  Magnesium 400 MG TABS, Take 400 mg by mouth daily., Disp: , Rfl:  .  metoCLOPramide (REGLAN) 10 MG tablet, Take 1 tablet (10 mg total) 4 (four) times daily by mouth., Disp: 360 tablet, Rfl: 0 .  metoprolol succinate (TOPROL XL) 25 MG 24 hr tablet, Take 1 tablet (25 mg total) by mouth every morning., Disp: 30 tablet, Rfl: 11 .  montelukast (SINGULAIR) 10 MG tablet, TAKE 1 TABLET IN THE EVENING ONCE A DAY ORALLY 30 DAYS ORAL, Disp: 90 tablet, Rfl: 0 .  Multiple Vitamins-Minerals (ALIVE ONCE DAILY WOMENS PO), Take 1 tablet by mouth daily., Disp: , Rfl:  .  omeprazole (PRILOSEC) 40 MG capsule, Take 40 mg by mouth daily., Disp: ,  Rfl:  .  pioglitazone (ACTOS) 30 MG tablet, TAKE 1 TABLET BY MOUTH EVERY DAY, Disp: 90 tablet, Rfl: 0 .  quinapril (ACCUPRIL) 40 MG tablet, Take 1 tablet (40 mg total) by mouth daily., Disp: 30 tablet, Rfl: 11 .  umeclidinium-vilanterol (ANORO ELLIPTA) 62.5-25 MCG/INH AEPB, Inhale 1 puff into the lungs daily., Disp: 60 each, Rfl: 5 .  Vitamin D, Ergocalciferol, (DRISDOL) 50000 units CAPS capsule, Take 1 capsule (50,000 Units total) by mouth 2 (two) times a week. Tuesday and Thursday, Disp: 30 capsule, Rfl: 3 Continue all other maintenance medications as listed above.  Follow up plan: Return in about 6 months (around 11/28/2017) for recheck.  Educational handout given for [place] PA-C [place] [address]  [place], [place] [address] [phone]   05/31/2017, 11:18 AM

## 2017-06-20 ENCOUNTER — Ambulatory Visit: Payer: Medicare Other

## 2017-07-03 ENCOUNTER — Other Ambulatory Visit: Payer: Self-pay | Admitting: *Deleted

## 2017-07-03 DIAGNOSIS — F411 Generalized anxiety disorder: Secondary | ICD-10-CM

## 2017-07-03 MED ORDER — MONTELUKAST SODIUM 10 MG PO TABS: ORAL_TABLET | ORAL | 0 refills | Status: DC

## 2017-07-03 MED ORDER — CITALOPRAM HYDROBROMIDE 20 MG PO TABS: 40.0000 mg | ORAL_TABLET | Freq: Every day | ORAL | 0 refills | Status: DC

## 2017-07-03 NOTE — Addendum Note (Signed)
Addended by: Antonietta Barcelona D on: 07/03/2017 09:02 AM   Modules accepted: Orders

## 2017-07-14 ENCOUNTER — Other Ambulatory Visit: Payer: Self-pay | Admitting: Physician Assistant

## 2017-07-15 ENCOUNTER — Other Ambulatory Visit: Payer: Self-pay | Admitting: Physician Assistant

## 2017-07-15 DIAGNOSIS — I10 Essential (primary) hypertension: Secondary | ICD-10-CM

## 2017-07-22 ENCOUNTER — Other Ambulatory Visit: Payer: Self-pay | Admitting: Physician Assistant

## 2017-07-22 DIAGNOSIS — E119 Type 2 diabetes mellitus without complications: Secondary | ICD-10-CM

## 2017-07-30 ENCOUNTER — Other Ambulatory Visit: Payer: Self-pay | Admitting: Cardiovascular Disease

## 2017-07-30 MED ORDER — METOPROLOL SUCCINATE ER 25 MG PO TB24: 25.0000 mg | ORAL_TABLET | ORAL | 3 refills | Status: DC

## 2017-07-30 NOTE — Telephone Encounter (Signed)
metoprolol succinate (TOPROL XL) 25 MG 24 hr tablet   Patient wanting 90 day supple  Pharmacy told her that we denied 90 day supple    [facility]

## 2017-07-30 NOTE — Telephone Encounter (Signed)
Rx refilled for 90 day supply. 

## 2017-08-08 ENCOUNTER — Other Ambulatory Visit: Payer: Self-pay | Admitting: Physician Assistant

## 2017-08-08 DIAGNOSIS — F411 Generalized anxiety disorder: Secondary | ICD-10-CM

## 2017-08-15 ENCOUNTER — Other Ambulatory Visit: Payer: Self-pay | Admitting: Physician Assistant

## 2017-08-15 DIAGNOSIS — F411 Generalized anxiety disorder: Secondary | ICD-10-CM

## 2017-08-24 ENCOUNTER — Other Ambulatory Visit: Payer: Self-pay | Admitting: Physician Assistant

## 2017-08-24 DIAGNOSIS — E119 Type 2 diabetes mellitus without complications: Secondary | ICD-10-CM

## 2017-09-02 ENCOUNTER — Other Ambulatory Visit: Payer: Self-pay | Admitting: Physician Assistant

## 2017-09-22 ENCOUNTER — Other Ambulatory Visit: Payer: Self-pay | Admitting: Physician Assistant

## 2017-09-22 DIAGNOSIS — E119 Type 2 diabetes mellitus without complications: Secondary | ICD-10-CM

## 2017-09-28 ENCOUNTER — Other Ambulatory Visit: Payer: Self-pay | Admitting: Physician Assistant

## 2017-09-28 DIAGNOSIS — F411 Generalized anxiety disorder: Secondary | ICD-10-CM

## 2017-10-01 ENCOUNTER — Other Ambulatory Visit: Payer: Self-pay | Admitting: Physician Assistant

## 2017-10-01 DIAGNOSIS — F411 Generalized anxiety disorder: Secondary | ICD-10-CM

## 2017-10-07 ENCOUNTER — Other Ambulatory Visit: Payer: Self-pay | Admitting: Physician Assistant

## 2017-10-07 DIAGNOSIS — I10 Essential (primary) hypertension: Secondary | ICD-10-CM

## 2017-11-14 ENCOUNTER — Ambulatory Visit: Payer: Medicare Other | Admitting: Physician Assistant

## 2017-11-16 ENCOUNTER — Ambulatory Visit: Payer: Medicare Other | Admitting: Physician Assistant

## 2017-11-23 ENCOUNTER — Encounter: Payer: Self-pay | Admitting: Physician Assistant

## 2017-11-23 ENCOUNTER — Ambulatory Visit (INDEPENDENT_AMBULATORY_CARE_PROVIDER_SITE_OTHER): Payer: Medicare Other | Admitting: Physician Assistant

## 2017-11-23 VITALS — BP 127/82 | HR 86 | Temp 97.6°F | Ht 64.0 in | Wt 194.8 lb

## 2017-11-23 DIAGNOSIS — F515 Nightmare disorder: Secondary | ICD-10-CM | POA: Diagnosis not present

## 2017-11-23 DIAGNOSIS — F411 Generalized anxiety disorder: Secondary | ICD-10-CM

## 2017-11-23 DIAGNOSIS — I10 Essential (primary) hypertension: Secondary | ICD-10-CM | POA: Diagnosis not present

## 2017-11-23 DIAGNOSIS — N189 Chronic kidney disease, unspecified: Secondary | ICD-10-CM | POA: Diagnosis not present

## 2017-11-23 DIAGNOSIS — R7989 Other specified abnormal findings of blood chemistry: Secondary | ICD-10-CM | POA: Diagnosis not present

## 2017-11-23 LAB — URINALYSIS, COMPLETE
Bilirubin, UA: NEGATIVE
Glucose, UA: NEGATIVE
Ketones, UA: NEGATIVE
LEUKOCYTES UA: NEGATIVE
Nitrite, UA: NEGATIVE
PH UA: 5 (ref 5.0–7.5)
RBC, UA: NEGATIVE
Specific Gravity, UA: 1.02 (ref 1.005–1.030)
Urobilinogen, Ur: 0.2 mg/dL (ref 0.2–1.0)

## 2017-11-23 LAB — MICROSCOPIC EXAMINATION
RBC, UA: NONE SEEN /hpf (ref 0–2)
RENAL EPITHEL UA: NONE SEEN /HPF

## 2017-11-23 MED ORDER — CITALOPRAM HYDROBROMIDE 20 MG PO TABS: 10.0000 mg | ORAL_TABLET | Freq: Every day | ORAL | 0 refills | Status: DC

## 2017-11-23 MED ORDER — CLONAZEPAM 0.25 MG PO TBDP: 0.2500 mg | ORAL_TABLET | Freq: Every day | ORAL | 5 refills | Status: DC

## 2017-11-23 NOTE — Progress Notes (Signed)
BP 127/82   Pulse 86   Temp 97.6 F (36.4 C) (Oral)   Ht '5\' 4"'$  (1.626 m)   Wt 194 lb 12.8 oz (88.4 kg)   BMI 33.44 kg/m    Subjective:    Patient ID: [name], female    DOB: May 10, 1951, 67 y.o.   MRN: [id number]  HPI: [name] is a 67 y.o. female presenting on 11/23/2017 for Hypertension and Diabetes  This patient comes in with a very long list of complaints.  She does have hypothyroidism would like to have her labs performed.  She does need general labs.  We have reviewed all of her medications.  She does have generalized anxiety and she feels like the Celexa is possibly giving her very bad dreams.  We are going to reduce this medication over the next couple weeks.  If it does not seem to get better she is to let us know.  She tried to reduce the clonidine but it did not make a difference with her dreams.  She does have essential hypertension, chronic kidney disease.  We will draw labs today to assure stability in these conditions.  Past Medical History:  Diagnosis Date  . Allergic rhinitis   . Allergy   . Anemia   . Ankle swelling   . Anxiety    takes Xanax daily   . Arthritis   . Asthma   . Blurred vision, bilateral   . Chronic kidney disease   . Depression    takes Celexa daily  . Diabetes mellitus without complication ([place])    type II; takes Actos daily  . GERD (gastroesophageal reflux disease)   . Gout   . Heart murmur   . Heart palpitations   . Hemorrhoids   . History of blood transfusion    no abnormal reaction noted  . History of bronchitis   . Hyperlipidemia   . Hypertension    takes Catapres and Quinapril daily  . Hypothyroidism    takes Synthroid daily  . Moderate mitral insufficiency    a. 12/2014 Echo: EF 55-60%, mod MR, mildly to moderately dil LA, PASP 63mHg.  . Osteoarthritis   . Parox[place]ricular tachycardia ([place]   . Ringing in ears   . Shortness of breath dyspnea    ALbuterol daily as needed  . Stevens[place]Johnson  disease ([place]   . Urinary incontinence    Relevant past medical, surgical, family and social history reviewed and updated as indicated. Interim medical history since our last visit reviewed. Allergies and medications reviewed and updated. DATA REVIEWED: CHART IN EPIC  Family History reviewed for pertinent findings.  Review of Systems  Constitutional: Positive for fatigue. Negative for activity change and fever.  HENT: Negative.   Eyes: Negative.   Respiratory: Negative.  Negative for cough.   Cardiovascular: Negative.  Negative for chest pain.  Gastrointestinal: Negative.  Negative for abdominal pain.  Endocrine: Negative.   Genitourinary: Negative.  Negative for dysuria.  Musculoskeletal: Negative.   Skin: Negative.   Neurological: Negative.  Negative for tremors, weakness and numbness.  Psychiatric/Behavioral: Positive for sleep disturbance. Negative for agitation, decreased concentration and dysphoric mood. The patient is nervous/anxious.     Allergies as of 11/23/2017      Reactions   Other Itching, Nausea Only, Other (See Comments)   Acidic foods cause stomach upset and itching   Allopurinol Rash   Penicillins Rash   Name brand   Sulfa Antibiotics Rash  Medication List        Accurate as of 11/23/17  4:19 PM. Always use your most recent med list.          albuterol 108 (90 Base) MCG/ACT inhaler Commonly known as:  PROVENTIL HFA;VENTOLIN HFA Inhale 2 puffs into the lungs 2 (two) times daily as needed for wheezing or shortness of breath.   ALIVE ONCE DAILY WOMENS PO Take 1 tablet by mouth daily.   ALPRAZolam 0.5 MG tablet Commonly known as:  XANAX TAKE 1 TABLET BY MOUTH AT BEDTIME AS NEEDED FOR ANXIETY   alum & mag hydroxide-simeth 200-200-20 MG/5ML suspension Commonly known as:  MAALOX/MYLANTA Take 30 mLs by mouth every 6 (six) hours as needed for indigestion or heartburn.   aspirin EC 81 MG tablet Take 81 mg by mouth daily.   atorvastatin 20 MG  tablet Commonly known as:  LIPITOR TAKE 1 TABLET EVERY EVENING   BIOFLAVONOIDS PO Take 2 tablets by mouth 2 (two) times daily.   budesonide-formoterol 160-4.5 MCG/ACT inhaler Commonly known as:  SYMBICORT Inhale 2 puffs into the lungs 2 (two) times daily.   cetirizine 10 MG tablet Commonly known as:  ZYRTEC Take 10 mg by mouth daily.   citalopram 20 MG tablet Commonly known as:  CELEXA Take 0.5 tablets (10 mg total) by mouth daily.   clonazePAM 0.25 MG disintegrating tablet Commonly known as:  KLONOPIN Take 1 tablet (0.25 mg total) by mouth at bedtime. Take at 8 PM   cloNIDine 0.2 MG tablet Commonly known as:  CATAPRES Take 1 tablet (0.2 mg total) 2 (two) times daily by mouth.   cromolyn 5.2 MG/ACT nasal spray Commonly known as:  NASALCROM Place 1 spray into both nostrils daily as needed for allergies.   [place] PO Take by mouth.   dextromethorphan-guaiFENesin 30-600 MG 12hr tablet Commonly known as:  MUCINEX DM Take 1 tablet by mouth 2 (two) times daily.   docusate sodium 100 MG capsule Commonly known as:  COLACE Take 100 mg by mouth daily.   folic acid 448 MCG tablet Commonly known as:  FOLVITE Take 400 mcg by mouth daily.   furosemide 40 MG tablet Commonly known as:  LASIX TAKE 1 TABLET (40 MG TOTAL) DAILY BY MOUTH.   glucose blood test strip Test blood sugars twice a day   hydrALAZINE 25 MG tablet Commonly known as:  APRESOLINE Take 0.5 tablets (12.5 mg total) by mouth 3 (three) times daily. FOR BLOOD PRESSURE   hydrOXYzine 10 MG tablet Commonly known as:  ATARAX/VISTARIL TAKE 1/2 TABLET BY MOUTH 3 TIMES DAILY FOR SEVERE ITCHING   levothyroxine 75 MCG tablet Commonly known as:  SYNTHROID, LEVOTHROID Take 1 tablet (75 mcg total) by mouth daily.   Magnesium 400 MG Tabs Take 400 mg by mouth daily.   metoCLOPramide 10 MG tablet Commonly known as:  REGLAN Take 1 tablet (10 mg total) 4 (four) times daily by mouth.   metoprolol  succinate 25 MG 24 hr tablet Commonly known as:  TOPROL XL Take 1 tablet (25 mg total) by mouth every morning.   montelukast 10 MG tablet Commonly known as:  SINGULAIR TAKE 1 TABLET IN THE EVENING ONCE A DAY ORALLY 30 DAYS ORAL   omeprazole 40 MG capsule Commonly known as:  PRILOSEC Take 40 mg by mouth daily.   pioglitazone 30 MG tablet Commonly known as:  ACTOS TAKE 1 TABLET BY MOUTH EVERY DAY   quinapril 40 MG tablet Commonly known as:  ACCUPRIL TAKE  1 TABLET BY MOUTH EVERY DAY   umeclidinium-vilanterol 62.5-25 MCG/INH Aepb Commonly known as:  ANORO ELLIPTA Inhale 1 puff into the lungs daily.   Vitamin D (Ergocalciferol) 50000 units Caps capsule Commonly known as:  DRISDOL TAKE 1 CAPSULE (50,000 UNITS TOTAL) BY MOUTH 2 (TWO) TIMES A WEEK. TUESDAY AND THURSDAY          Objective:    BP 127/82   Pulse 86   Temp 97.6 F (36.4 C) (Oral)   Ht '5\' 4"'$  (1.626 m)   Wt 194 lb 12.8 oz (88.4 kg)   BMI 33.44 kg/m   Allergies  Allergen Reactions  . Other Itching, Nausea Only and Other (See Comments)    Acidic foods cause stomach upset and itching  . Allopurinol Rash  . Penicillins Rash    Name brand  . Sulfa Antibiotics Rash    Wt Readings from Last 3 Encounters:  11/23/17 194 lb 12.8 oz (88.4 kg)  05/30/17 193 lb 3.2 oz (87.6 kg)  05/11/17 189 lb (85.7 kg)    Physical Exam  Constitutional: She is oriented to person, place, and time. She appears well-developed and well-nourished.  HENT:  Head: Normocephalic and atraumatic.  Right Ear: Tympanic membrane, external ear and ear canal normal.  Left Ear: Tympanic membrane, external ear and ear canal normal.  Nose: Nose normal. No rhinorrhea.  Mouth/Throat: Oropharynx is clear and moist and mucous membranes are normal. No oropharyngeal exudate or posterior oropharyngeal erythema.  Eyes: Pupils are equal, round, and reactive to light. Conjunctivae and EOM are normal.  Neck: Normal range of motion. Neck supple.    Cardiovascular: Normal rate, regular rhythm, normal heart sounds and intact distal pulses.  Pulmonary/Chest: Effort normal and breath sounds normal.  Abdominal: Soft. Bowel sounds are normal.  Neurological: She is alert and oriented to person, place, and time. She has normal reflexes.  Skin: Skin is warm and dry. No rash noted.  Psychiatric: She has a normal mood and affect. Her behavior is normal. Judgment and thought content normal.    Results for orders placed or performed in visit on 11/23/17  Microscopic Examination  Result Value Ref Range   WBC, UA 0-5 0 - 5 /hpf   RBC, UA None seen 0 - 2 /hpf   Epithelial Cells (non renal) 0-10 0 - 10 /hpf   Renal Epithel, UA None seen None seen /hpf   Bacteria, UA Few (A) None seen/Few  Urinalysis, Complete  Result Value Ref Range   Specific Gravity, UA 1.020 1.005 - 1.030   pH, UA 5.0 5.0 - 7.5   Color, UA Yellow Yellow   Appearance Ur Clear Clear   Leukocytes, UA Negative Negative   Protein, UA 1+ (A) Negative/Trace   Glucose, UA Negative Negative   Ketones, UA Negative Negative   RBC, UA Negative Negative   Bilirubin, UA Negative Negative   Urobilinogen, Ur 0.2 0.2 - 1.0 mg/dL   Nitrite, UA Negative Negative   Microscopic Examination See below:       Assessment & Plan:   1. Generalized anxiety disorder - citalopram (CELEXA) 20 MG tablet; Take 0.5 tablets (10 mg total) by mouth daily.  Dispense: 180 tablet; Refill: 0 - clonazePAM (KLONOPIN) 0.25 MG disintegrating tablet; Take 1 tablet (0.25 mg total) by mouth at bedtime. Take at 8 PM  Dispense: 30 tablet; Refill: 5  2. Essential hypertension - CBC with Differential/Platelet - CMP14+EGFR - Lipid panel - Microalbumin / creatinine urine ratio  3. Elevated TSH -  Thyroid Panel With TSH  4. Chronic kidney disease, unspecified CKD stage - CBC with Differential/Platelet - CMP14+EGFR - Lipid panel - Urinalysis, Complete - Microalbumin / creatinine urine ratio  5.  Nightmares Attempt medication cessation to see if they improve   Continue all other maintenance medications as listed above.  Follow up plan: Return in about 3 months (around 02/23/2018) for recheck.  Educational handout given for [place] PA-C [place] [address]  [place], [place] [address] [phone]   11/23/2017, 4:19 PM

## 2017-11-24 LAB — CMP14+EGFR
A/G RATIO: 1.5 (ref 1.2–2.2)
ALK PHOS: 174 IU/L — AB (ref 39–117)
ALT: 8 IU/L (ref 0–32)
AST: 14 IU/L (ref 0–40)
Albumin: 4.5 g/dL (ref 3.6–4.8)
BUN/Creatinine Ratio: 20 (ref 12–28)
BUN: 23 mg/dL (ref 8–27)
Bilirubin Total: 0.3 mg/dL (ref 0.0–1.2)
CALCIUM: 10 mg/dL (ref 8.7–10.3)
CO2: 20 mmol/L (ref 20–29)
Chloride: 101 mmol/L (ref 96–106)
Creatinine, Ser: 1.15 mg/dL — ABNORMAL HIGH (ref 0.57–1.00)
GFR calc Af Amer: 58 mL/min/{1.73_m2} — ABNORMAL LOW (ref >59)
GFR, EST NON AFRICAN AMERICAN: 50 mL/min/{1.73_m2} — AB (ref >59)
GLOBULIN, TOTAL: 3 g/dL (ref 1.5–4.5)
Glucose: 135 mg/dL — ABNORMAL HIGH (ref 65–99)
POTASSIUM: 4 mmol/L (ref 3.5–5.2)
SODIUM: 140 mmol/L (ref 134–144)
Total Protein: 7.5 g/dL (ref 6.0–8.5)

## 2017-11-24 LAB — CBC WITH DIFFERENTIAL/PLATELET
BASOS: 0 %
Basophils Absolute: 0 10*3/uL (ref 0.0–0.2)
EOS (ABSOLUTE): 0.1 10*3/uL (ref 0.0–0.4)
Eos: 2 %
HEMATOCRIT: 37.6 % (ref 34.0–46.6)
Hemoglobin: 12 g/dL (ref 11.1–15.9)
Immature Grans (Abs): 0 10*3/uL (ref 0.0–0.1)
Immature Granulocytes: 0 %
LYMPHS ABS: 1.4 10*3/uL (ref 0.7–3.1)
Lymphs: 24 %
MCH: 26 pg — AB (ref 26.6–33.0)
MCHC: 31.9 g/dL (ref 31.5–35.7)
MCV: 82 fL (ref 79–97)
MONOS ABS: 0.4 10*3/uL (ref 0.1–0.9)
Monocytes: 7 %
NEUTROS ABS: 3.9 10*3/uL (ref 1.4–7.0)
Neutrophils: 67 %
Platelets: 248 10*3/uL (ref 150–450)
RBC: 4.61 x10E6/uL (ref 3.77–5.28)
RDW: 14.1 % (ref 12.3–15.4)
WBC: 5.8 10*3/uL (ref 3.4–10.8)

## 2017-11-24 LAB — MICROALBUMIN / CREATININE URINE RATIO
CREATININE, UR: 217.7 mg/dL
MICROALB/CREAT RATIO: 31.2 mg/g{creat} — AB (ref 0.0–30.0)
Microalbumin, Urine: 67.9 ug/mL

## 2017-11-24 LAB — THYROID PANEL WITH TSH
FREE THYROXINE INDEX: 3.3 (ref 1.2–4.9)
T3 Uptake Ratio: 29 % (ref 24–39)
T4, Total: 11.4 ug/dL (ref 4.5–12.0)
TSH: 0.689 u[IU]/mL (ref 0.450–4.500)

## 2017-11-24 LAB — LIPID PANEL
Chol/HDL Ratio: 5.1 ratio — ABNORMAL HIGH (ref 0.0–4.4)
Cholesterol, Total: 159 mg/dL (ref 100–199)
HDL: 31 mg/dL — AB (ref >39)
LDL Calculated: 99 mg/dL (ref 0–99)
Triglycerides: 143 mg/dL (ref 0–149)
VLDL CHOLESTEROL CAL: 29 mg/dL (ref 5–40)

## 2017-11-28 ENCOUNTER — Ambulatory Visit: Payer: Medicare Other | Admitting: Physician Assistant

## 2017-11-30 ENCOUNTER — Telehealth: Payer: Self-pay | Admitting: Physician Assistant

## 2017-11-30 ENCOUNTER — Other Ambulatory Visit: Payer: Self-pay | Admitting: Physician Assistant

## 2017-11-30 NOTE — Telephone Encounter (Signed)
Patient aware of recommendation.  

## 2017-11-30 NOTE — Telephone Encounter (Signed)
Please review and advise.

## 2017-11-30 NOTE — Telephone Encounter (Signed)
You are on the lowest dose and canr educe to 1/2 tab at bed and see how this feel.

## 2017-11-30 NOTE — Telephone Encounter (Signed)
Lmtcb/ww  

## 2017-12-24 ENCOUNTER — Other Ambulatory Visit: Payer: Self-pay | Admitting: Family Medicine

## 2018-01-08 DIAGNOSIS — I10 Essential (primary) hypertension: Secondary | ICD-10-CM | POA: Diagnosis not present

## 2018-01-08 DIAGNOSIS — E1165 Type 2 diabetes mellitus with hyperglycemia: Secondary | ICD-10-CM | POA: Diagnosis not present

## 2018-01-08 DIAGNOSIS — J984 Other disorders of lung: Secondary | ICD-10-CM | POA: Diagnosis not present

## 2018-01-08 DIAGNOSIS — K649 Unspecified hemorrhoids: Secondary | ICD-10-CM | POA: Diagnosis not present

## 2018-01-08 DIAGNOSIS — R262 Difficulty in walking, not elsewhere classified: Secondary | ICD-10-CM | POA: Diagnosis not present

## 2018-01-08 DIAGNOSIS — K219 Gastro-esophageal reflux disease without esophagitis: Secondary | ICD-10-CM | POA: Diagnosis not present

## 2018-01-08 DIAGNOSIS — R110 Nausea: Secondary | ICD-10-CM | POA: Diagnosis not present

## 2018-01-08 DIAGNOSIS — N189 Chronic kidney disease, unspecified: Secondary | ICD-10-CM | POA: Diagnosis not present

## 2018-01-08 DIAGNOSIS — M6281 Muscle weakness (generalized): Secondary | ICD-10-CM | POA: Diagnosis not present

## 2018-01-08 DIAGNOSIS — J309 Allergic rhinitis, unspecified: Secondary | ICD-10-CM | POA: Diagnosis not present

## 2018-01-08 DIAGNOSIS — M545 Low back pain: Secondary | ICD-10-CM | POA: Diagnosis not present

## 2018-01-08 DIAGNOSIS — M1000 Idiopathic gout, unspecified site: Secondary | ICD-10-CM | POA: Diagnosis not present

## 2018-01-08 DIAGNOSIS — E039 Hypothyroidism, unspecified: Secondary | ICD-10-CM | POA: Diagnosis not present

## 2018-01-08 DIAGNOSIS — R000 Tachycardia, unspecified: Secondary | ICD-10-CM | POA: Diagnosis not present

## 2018-01-08 DIAGNOSIS — J4530 Mild persistent asthma, uncomplicated: Secondary | ICD-10-CM | POA: Diagnosis not present

## 2018-01-08 DIAGNOSIS — E1110 Type 2 diabetes mellitus with ketoacidosis without coma: Secondary | ICD-10-CM | POA: Diagnosis not present

## 2018-01-08 DIAGNOSIS — I051 Rheumatic mitral insufficiency: Secondary | ICD-10-CM | POA: Diagnosis not present

## 2018-01-08 DIAGNOSIS — D72829 Elevated white blood cell count, unspecified: Secondary | ICD-10-CM | POA: Diagnosis not present

## 2018-01-08 DIAGNOSIS — M1990 Unspecified osteoarthritis, unspecified site: Secondary | ICD-10-CM | POA: Diagnosis not present

## 2018-01-08 DIAGNOSIS — N179 Acute kidney failure, unspecified: Secondary | ICD-10-CM | POA: Diagnosis not present

## 2018-01-08 DIAGNOSIS — D631 Anemia in chronic kidney disease: Secondary | ICD-10-CM | POA: Diagnosis not present

## 2018-01-08 DIAGNOSIS — I129 Hypertensive chronic kidney disease with stage 1 through stage 4 chronic kidney disease, or unspecified chronic kidney disease: Secondary | ICD-10-CM | POA: Diagnosis not present

## 2018-01-08 DIAGNOSIS — D649 Anemia, unspecified: Secondary | ICD-10-CM | POA: Diagnosis not present

## 2018-01-08 DIAGNOSIS — E872 Acidosis: Secondary | ICD-10-CM | POA: Diagnosis not present

## 2018-01-08 DIAGNOSIS — R0689 Other abnormalities of breathing: Secondary | ICD-10-CM | POA: Diagnosis not present

## 2018-01-08 DIAGNOSIS — E785 Hyperlipidemia, unspecified: Secondary | ICD-10-CM | POA: Diagnosis not present

## 2018-01-09 ENCOUNTER — Other Ambulatory Visit: Payer: Self-pay | Admitting: Physician Assistant

## 2018-01-09 DIAGNOSIS — I10 Essential (primary) hypertension: Secondary | ICD-10-CM

## 2018-01-11 ENCOUNTER — Inpatient Hospital Stay
Admission: RE | Admit: 2018-01-11 | Discharge: 2018-01-22 | Disposition: A | Discharge status: Home / Self Care | Payer: Medicare Other | Source: Ambulatory Visit | Attending: Internal Medicine | Admitting: Internal Medicine

## 2018-01-11 DIAGNOSIS — G4700 Insomnia, unspecified: Secondary | ICD-10-CM | POA: Diagnosis not present

## 2018-01-11 DIAGNOSIS — E785 Hyperlipidemia, unspecified: Secondary | ICD-10-CM | POA: Diagnosis not present

## 2018-01-11 DIAGNOSIS — R262 Difficulty in walking, not elsewhere classified: Secondary | ICD-10-CM | POA: Diagnosis not present

## 2018-01-11 DIAGNOSIS — K219 Gastro-esophageal reflux disease without esophagitis: Secondary | ICD-10-CM | POA: Diagnosis not present

## 2018-01-11 DIAGNOSIS — E1010 Type 1 diabetes mellitus with ketoacidosis without coma: Secondary | ICD-10-CM | POA: Diagnosis not present

## 2018-01-11 DIAGNOSIS — N189 Chronic kidney disease, unspecified: Secondary | ICD-10-CM | POA: Diagnosis not present

## 2018-01-11 DIAGNOSIS — K649 Unspecified hemorrhoids: Secondary | ICD-10-CM | POA: Diagnosis not present

## 2018-01-11 DIAGNOSIS — E1110 Type 2 diabetes mellitus with ketoacidosis without coma: Secondary | ICD-10-CM | POA: Diagnosis not present

## 2018-01-11 DIAGNOSIS — J309 Allergic rhinitis, unspecified: Secondary | ICD-10-CM | POA: Diagnosis not present

## 2018-01-11 DIAGNOSIS — D649 Anemia, unspecified: Secondary | ICD-10-CM | POA: Diagnosis not present

## 2018-01-11 DIAGNOSIS — I10 Essential (primary) hypertension: Secondary | ICD-10-CM | POA: Diagnosis not present

## 2018-01-11 DIAGNOSIS — D631 Anemia in chronic kidney disease: Secondary | ICD-10-CM | POA: Diagnosis not present

## 2018-01-11 DIAGNOSIS — M6281 Muscle weakness (generalized): Secondary | ICD-10-CM | POA: Diagnosis not present

## 2018-01-11 DIAGNOSIS — I051 Rheumatic mitral insufficiency: Secondary | ICD-10-CM | POA: Diagnosis not present

## 2018-01-11 DIAGNOSIS — R2243 Localized swelling, mass and lump, lower limb, bilateral: Secondary | ICD-10-CM | POA: Diagnosis not present

## 2018-01-11 DIAGNOSIS — M1990 Unspecified osteoarthritis, unspecified site: Secondary | ICD-10-CM | POA: Diagnosis not present

## 2018-01-11 DIAGNOSIS — I129 Hypertensive chronic kidney disease with stage 1 through stage 4 chronic kidney disease, or unspecified chronic kidney disease: Secondary | ICD-10-CM | POA: Diagnosis not present

## 2018-01-11 DIAGNOSIS — R600 Localized edema: Secondary | ICD-10-CM | POA: Diagnosis not present

## 2018-01-11 DIAGNOSIS — J4530 Mild persistent asthma, uncomplicated: Secondary | ICD-10-CM | POA: Diagnosis not present

## 2018-01-11 DIAGNOSIS — E1165 Type 2 diabetes mellitus with hyperglycemia: Secondary | ICD-10-CM | POA: Diagnosis not present

## 2018-01-11 DIAGNOSIS — M545 Low back pain: Secondary | ICD-10-CM | POA: Diagnosis not present

## 2018-01-11 DIAGNOSIS — J4520 Mild intermittent asthma, uncomplicated: Secondary | ICD-10-CM | POA: Diagnosis not present

## 2018-01-11 DIAGNOSIS — E039 Hypothyroidism, unspecified: Secondary | ICD-10-CM | POA: Diagnosis not present

## 2018-01-11 DIAGNOSIS — M1000 Idiopathic gout, unspecified site: Secondary | ICD-10-CM | POA: Diagnosis not present

## 2018-01-12 ENCOUNTER — Encounter (HOSPITAL_COMMUNITY)
Admission: RE | Admit: 2018-01-12 | Discharge: 2018-01-12 | Disposition: A | Discharge status: Home / Self Care | Payer: Medicare Other | Source: Skilled Nursing Facility | Attending: Internal Medicine | Admitting: Internal Medicine

## 2018-01-12 DIAGNOSIS — I10 Essential (primary) hypertension: Secondary | ICD-10-CM | POA: Insufficient documentation

## 2018-01-12 LAB — BASIC METABOLIC PANEL
Anion gap: 9 (ref 5–15)
BUN: 19 mg/dL (ref 8–23)
CHLORIDE: 111 mmol/L (ref 98–111)
CO2: 18 mmol/L — AB (ref 22–32)
CREATININE: 1.36 mg/dL — AB (ref 0.44–1.00)
Calcium: 8.7 mg/dL — ABNORMAL LOW (ref 8.9–10.3)
GFR calc non Af Amer: 40 mL/min — ABNORMAL LOW (ref >60)
GFR, EST AFRICAN AMERICAN: 46 mL/min — AB (ref >60)
GLUCOSE: 302 mg/dL — AB (ref 70–99)
Potassium: 3.3 mmol/L — ABNORMAL LOW (ref 3.5–5.1)
Sodium: 138 mmol/L (ref 135–145)

## 2018-01-12 LAB — CBC WITH DIFFERENTIAL/PLATELET
Basophils Absolute: 0 10*3/uL (ref 0.0–0.1)
Basophils Relative: 0 %
Eosinophils Absolute: 0 10*3/uL (ref 0.0–0.7)
Eosinophils Relative: 0 %
HEMATOCRIT: 32.1 % — AB (ref 36.0–46.0)
HEMOGLOBIN: 10.9 g/dL — AB (ref 12.0–15.0)
LYMPHS ABS: 1.4 10*3/uL (ref 0.7–4.0)
LYMPHS PCT: 18 %
MCH: 25.7 pg — ABNORMAL LOW (ref 26.0–34.0)
MCHC: 34 g/dL (ref 30.0–36.0)
MCV: 75.7 fL — AB (ref 78.0–100.0)
MONOS PCT: 6 %
Monocytes Absolute: 0.5 10*3/uL (ref 0.1–1.0)
NEUTROS ABS: 5.8 10*3/uL (ref 1.7–7.7)
NEUTROS PCT: 76 %
Platelets: 177 10*3/uL (ref 150–400)
RBC: 4.24 MIL/uL (ref 3.87–5.11)
RDW: 14.7 % (ref 11.5–15.5)
WBC: 7.7 10*3/uL (ref 4.0–10.5)

## 2018-01-14 ENCOUNTER — Encounter: Payer: Self-pay | Admitting: Internal Medicine

## 2018-01-14 ENCOUNTER — Non-Acute Institutional Stay (SKILLED_NURSING_FACILITY): Payer: Medicare Other | Admitting: Internal Medicine

## 2018-01-14 DIAGNOSIS — R600 Localized edema: Secondary | ICD-10-CM | POA: Diagnosis not present

## 2018-01-14 DIAGNOSIS — E1010 Type 1 diabetes mellitus with ketoacidosis without coma: Secondary | ICD-10-CM | POA: Diagnosis not present

## 2018-01-14 DIAGNOSIS — E039 Hypothyroidism, unspecified: Secondary | ICD-10-CM | POA: Insufficient documentation

## 2018-01-14 DIAGNOSIS — J4520 Mild intermittent asthma, uncomplicated: Secondary | ICD-10-CM

## 2018-01-14 DIAGNOSIS — I10 Essential (primary) hypertension: Secondary | ICD-10-CM

## 2018-01-14 DIAGNOSIS — E1110 Type 2 diabetes mellitus with ketoacidosis without coma: Secondary | ICD-10-CM | POA: Insufficient documentation

## 2018-01-14 NOTE — Progress Notes (Signed)
Provider:Meka Lewan Lyndel Safe MD   Location:   [facility] Room Number: 129/P Place of Service:  SNF [phone]  PCP: Terald Sleeper, PA-C Patient Care Team: Theodoro Clock as PCP - General (General Practice) Garald Balding, MD as Consulting Physician (Orthopedic Surgery)  Extended Emergency Contact Information Primary Emergency Contact: [facility] Address: [address]          [place], [place] [address] [place] Phone: [phone] Relation: Sister  Code Status: Full Code Goals of Care: Advanced Directive information Advanced Directives 01/14/2018  Does Patient Have a Medical Advance Directive? Yes  Type of Advance Directive (No Data)  Does patient want to make changes to medical advance directive? No - Patient declined  Copy of [place] in Chart? -  Would patient like information on creating a medical advance directive? -      Chief Complaint  Patient presents with  . New Admit To SNF    Patient is being seen for New Admission Visit    HPI: Patient is a 67 y.o. female seen today for admission to SNF for therapy. She was admitted after staying in the hospital from 07/30-08/04 for diabetic ketoacidosis  Patient has a history of hypertension, diabetes, hyperlipidemia, palpitations, anemia, depression and anxiety She was brought to the emergency room by her family members as she was confused and was dizzy.  Her initial sugar was 569, lactic acid was 2.9 and she had Ketones in her Urine.Patient was admitted to ICU and was started on insulin drip and fluids. Her mental status improved. She is now in SNF for therapy. Patient's mental status is back to baseline.  She is already ambulating with mild assist. She denied any cough, chest pain.  She did complain of some sensation in both lower extremities.  She also has a chronic hip pain and takes Norco as needed. Lives by herself but has help from her Sister and Brother Was walking  with the Kasandra Knudsen before  Past Medical History:  Diagnosis Date  . Allergic rhinitis   . Allergy   . Anemia   . Ankle swelling   . Anxiety    takes Xanax daily   . Arthritis   . Asthma   . Blurred vision, bilateral   . Chronic kidney disease   . Depression    takes Celexa daily  . Diabetes mellitus without complication ([place])    type II; takes Actos daily  . GERD (gastroesophageal reflux disease)   . Gout   . Heart murmur   . Heart palpitations   . Hemorrhoids   . History of blood transfusion    no abnormal reaction noted  . History of bronchitis   . Hyperlipidemia   . Hypertension    takes Catapres and Quinapril daily  . Hypothyroidism    takes Synthroid daily  . Moderate mitral insufficiency    a. 12/2014 Echo: EF 55-60%, mod MR, mildly to moderately dil LA, PASP 49mmHg.  . Osteoarthritis   . Paroxysmal supraventricular tachycardia ([place])   . Ringing in ears   . Shortness of breath dyspnea    ALbuterol daily as needed  . Stevens-Johnson disease ([place])   . Urinary incontinence    Past Surgical History:  Procedure Laterality Date  . ABDOMINAL HYSTERECTOMY    . BOWEL RESECTION    . COLON SURGERY    . COLONOSCOPY    . ESOPHAGOGASTRODUODENOSCOPY    . HERNIA REPAIR  hiatal hernia repair  . TOTAL HIP ARTHROPLASTY Left 02/02/2015   Procedure: LEFT TOTAL HIP ARTHROPLASTY;  Surgeon: Garald Balding, MD;  Location: [facility];  Service: Orthopedics;  Laterality: Left;  . TOTAL HIP ARTHROPLASTY Right 02/01/2016   Procedure: TOTAL HIP ARTHROPLASTY;  Surgeon: Garald Balding, MD;  Location: [facility];  Service: Orthopedics;  Laterality: Right;    reports that she has never smoked. She has never used smokeless tobacco. She reports that she does not drink alcohol or use drugs. Social History   Socioeconomic History  . Marital status: Single    Spouse name: Not on file  . Number of children: Not on file  . Years of education: Not on file  . Highest education level: Not on file    Occupational History  . Not on file  Social Needs  . Financial resource strain: Not on file  . Food insecurity:    Worry: Not on file    Inability: Not on file  . Transportation needs:    Medical: Not on file    Non-medical: Not on file  Tobacco Use  . Smoking status: Never Smoker  . Smokeless tobacco: Never Used  Substance and Sexual Activity  . Alcohol use: No    Alcohol/week: 0.0 oz  . Drug use: No  . Sexual activity: Not on file  Lifestyle  . Physical activity:    Days per week: Not on file    Minutes per session: Not on file  . Stress: Not on file  Relationships  . Social connections:    Talks on phone: Not on file    Gets together: Not on file    Attends religious service: Not on file    Active member of club or organization: Not on file    Attends meetings of clubs or organizations: Not on file    Relationship status: Not on file  . Intimate partner violence:    Fear of current or ex partner: Not on file    Emotionally abused: Not on file    Physically abused: Not on file    Forced sexual activity: Not on file  Other Topics Concern  . Not on file  Social History Narrative  . Not on file    Functional Status Survey:    Family History  Problem Relation Age of Onset  . Heart attack Mother   . Cancer Father   . COPD Father     Health Maintenance  Topic Date Due  . FOOT EXAM  02/14/2018 (Originally 01/30/1961)  . MAMMOGRAM  02/14/2018 (Originally 11/23/2013)  . PAP SMEAR  02/14/2018 (Originally 01/30/1973)  . HEMOGLOBIN A1C  02/14/2018 (Originally 08/02/2017)  . OPHTHALMOLOGY EXAM  02/14/2018 (Originally 01/30/1961)  . DEXA SCAN  02/14/2018 (Originally 01/30/2017)  . TETANUS/TDAP  02/14/2018 (Originally 01/31/1971)  . Hepatitis C Screening  02/14/2018 (Originally [birth year])  . HIV Screening  02/14/2018 (Originally 01/31/1967)  . PNA vac Low Risk Adult (1 of 2 - PCV13) 02/14/2018 (Originally 02/03/2017)  . INFLUENZA VACCINE  05/07/2018 (Originally 01/10/2018)   . COLONOSCOPY  11/19/2019    Allergies  Allergen Reactions  . Other Itching, Nausea Only and Other (See Comments)    Acidic foods cause stomach upset and itching  . Allopurinol Rash  . Penicillins Rash    Name brand  . Sulfa Antibiotics Rash    Allergies as of 01/14/2018      Reactions   Other Itching, Nausea Only, Other (See Comments)   Acidic foods cause stomach  upset and itching   Allopurinol Rash   Penicillins Rash   Name brand   Sulfa Antibiotics Rash      Medication List        Accurate as of 01/14/18  9:17 AM. Always use your most recent med list.          acetaminophen 325 MG tablet Commonly known as:  TYLENOL Take 650 mg by mouth every 4 (four) hours as needed.   albuterol 108 (90 Base) MCG/ACT inhaler Commonly known as:  PROVENTIL HFA;VENTOLIN HFA Inhale 2 puffs into the lungs 2 (two) times daily as needed for wheezing or shortness of breath.   amLODipine 10 MG tablet Commonly known as:  NORVASC Take 10 mg by mouth daily.   budesonide-formoterol 80-4.5 MCG/ACT inhaler Commonly known as:  SYMBICORT Inhale 2 puffs into the lungs 2 (two) times daily.   citalopram 20 MG tablet Commonly known as:  CELEXA Take 0.5 tablets (10 mg total) by mouth daily.   clonazePAM 0.5 MG tablet Commonly known as:  KLONOPIN Take 0.5 mg by mouth 2 (two) times daily as needed for anxiety. Give for 14 days and then MD to reassess need from 01/11/2018-01/24/2018   cloNIDine 0.1 MG tablet Commonly known as:  CATAPRES Take 0.1 mg by mouth 2 (two) times daily.   ergocalciferol 50000 units capsule Commonly known as:  VITAMIN D2 Give 1 tablet by mouth on Tues. and Fri. once a day   fluticasone 50 MCG/ACT nasal spray Commonly known as:  FLONASE Place 1 spray into both nostrils 2 (two) times daily as needed for allergies or rhinitis.   glucose blood test strip Test blood sugars twice a day   HUMALOG KWIKPEN 100 UNIT/ML KiwkPen Generic drug:  insulin lispro Give per sliding  scale   HUMALOG MIX 75/25 KWIKPEN [place] Inject 15 Units into the skin 2 (two) times daily.   levothyroxine 88 MCG tablet Commonly known as:  SYNTHROID, LEVOTHROID Take 88 mcg by mouth daily before breakfast.   Magnesium 400 MG Tabs Take 400 mg by mouth daily.   metoprolol tartrate 100 MG tablet Commonly known as:  LOPRESSOR Take 100 mg by mouth 2 (two) times daily.   montelukast 10 MG tablet Commonly known as:  SINGULAIR TAKE 1 TABLET BY MOUTH ONCE DAILY IN THE EVENING   omeprazole 40 MG capsule Commonly known as:  PRILOSEC Take 40 mg by mouth daily.   oxyCODONE-acetaminophen 5-325 MG tablet Commonly known as:  PERCOCET/ROXICET Take 1 tablet by mouth 2 (two) times daily as needed for severe pain.   simvastatin 40 MG tablet Commonly known as:  ZOCOR Take 20 mg by mouth daily.   valsartan-hydrochlorothiazide 320-25 MG tablet Commonly known as:  DIOVAN-HCT Take 1 tablet by mouth daily.       Review of Systems  Review of Systems  Constitutional: Negative for activity change, appetite change, chills, diaphoresis, fatigue and fever.  HENT: Negative for mouth sores, postnasal drip, rhinorrhea, sinus pain and sore throat.   Respiratory: Negative for apnea, cough, chest tightness, shortness of breath and wheezing.   Cardiovascular: Negative for chest pain, palpitations and leg swelling.  Gastrointestinal: Negative for abdominal distention, abdominal pain, constipation, diarrhea, nausea and vomiting.  Genitourinary: Negative for dysuria and frequency.  Musculoskeletal: Negative for arthralgias, joint swelling and myalgias.  Skin: Negative for rash.  Neurological: Negative for dizziness, syncope, weakness, light-headedness and numbness.  Psychiatric/Behavioral: Negative for behavioral problems, confusion and sleep disturbance.     Vitals:   01/14/18 6440  BP: 120/70  Pulse: 78  Resp: 18  Temp: 98 F (36.7 C)  TempSrc: Oral  SpO2: 98%   There is no height or weight on  file to calculate BMI. Physical Exam  Constitutional: Oriented to person, place, and time. Well-developed and well-nourished.  HENT:  Head: Normocephalic.  Mouth/Throat: Oropharynx is clear and moist.  Eyes: Pupils are equal, round, and reactive to light.  Neck: Neck supple.  Cardiovascular: Normal rate and normal heart sounds.  No murmur heard. Pulmonary/Chest: Effort normal and breath sounds normal. No respiratory distress. No wheezes. She has no rales.  Abdominal: Soft. Bowel sounds are normal. No distension. There is no tenderness. There is no rebound.  Musculoskeletal: Has Mild edema Bilateral with Left More then Right with some Calf tenderness Also had small swelling in Left UE near the Bruise  Lymphadenopathy: none Neurological: Alert and oriented to person, place, and time.  Skin: Skin is warm and dry.  Psychiatric: Normal mood and affect. Behavior is normal. Thought content normal.    Labs reviewed: Basic Metabolic Panel: Recent Labs    01/30/17 1159 11/23/17 1051 01/12/18 1030  NA 140 140 138  K 4.7 4.0 3.3*  CL 102 101 111  CO2 23 20 18*  GLUCOSE 107* 135* 302*  BUN 26 23 19   CREATININE 1.21* 1.15* 1.36*  CALCIUM 10.0 10.0 8.7*   Liver Function Tests: Recent Labs    01/30/17 1159 11/23/17 1051  AST 24 14  ALT 12 8  ALKPHOS 138* 174*  BILITOT 0.2 0.3  PROT 7.3 7.5  ALBUMIN 4.3 4.5   No results for input(s): LIPASE, AMYLASE in the last 8760 hours. No results for input(s): AMMONIA in the last 8760 hours. CBC: Recent Labs    01/30/17 1159 11/23/17 1051 01/12/18 1030  WBC 5.3 5.8 7.7  NEUTROABS 3.4 3.9 5.8  HGB 10.8* 12.0 10.9*  HCT 32.5* 37.6 32.1*  MCV 77* 82 75.7*  PLT 234 248 177   Cardiac Enzymes: No results for input(s): CKTOTAL, CKMB, CKMBINDEX, TROPONINI in the last 8760 hours. BNP: Invalid input(s): POCBNP Lab Results  Component Value Date   HGBA1C 6.0 (H) 02/01/2016   Lab Results  Component Value Date   TSH 0.689 11/23/2017    No results found for: VITAMINB12 No results found for: FOLATE No results found for: IRON, TIBC, FERRITIN  Imaging and Procedures obtained prior to SNF admission: No results found.  Assessment/Plan Diabetes Ketoacidosis Patient started on Humalog mix in the hospital twice a day We will change it to the long-acting Lantus 20 units nightly Continue QAC and HS nightly Accu-Cheks Covered with sliding scale We will reassess and consider Humalog boluses if needed Diet and Insulin Education   Essential hypertension In the Hospital Patient was started on Diovan , And Norvasc and dose of Lopressor was increased She was taken off Hydralazine BP stable  Hypothyroidism, Will repeat TSH Continue Levithyroxine Leg edema Patient was on Lasix and is now on Diovan Will continue to monitor Also Do Dopplers as she has been c/o Pain in her Left Leg  Anxiety Patient says she does not want to take Celexa or Klonopin So will Discontinued H/o Mild Asthma On Singulair And Symbicort changed to Anoro per Patient request LE pain Oxycodone discontinued and started on Norco Prn  Will consider Neurontin if pain persist Hyperlipidemia On simvastatin  Follows with PCP [facility] ' Start Therapy Plans for going home.    Family/ staff Communication:   Labs/tests ordered: Total time spent in  this patient care encounter was 45_ minutes; greater than 50% of the visit spent counseling patient, reviewing records , Labs and coordinating care for problems addressed at this encounter.

## 2018-01-15 ENCOUNTER — Other Ambulatory Visit: Payer: Self-pay

## 2018-01-15 ENCOUNTER — Encounter (HOSPITAL_COMMUNITY)
Admission: RE | Admit: 2018-01-15 | Discharge: 2018-01-15 | Disposition: A | Discharge status: Home / Self Care | Payer: Medicare Other | Source: Skilled Nursing Facility | Attending: Internal Medicine | Admitting: Internal Medicine

## 2018-01-15 DIAGNOSIS — I129 Hypertensive chronic kidney disease with stage 1 through stage 4 chronic kidney disease, or unspecified chronic kidney disease: Secondary | ICD-10-CM | POA: Insufficient documentation

## 2018-01-15 LAB — CBC WITH DIFFERENTIAL/PLATELET
BASOS ABS: 0 10*3/uL (ref 0.0–0.1)
Basophils Relative: 0 %
EOS ABS: 0.1 10*3/uL (ref 0.0–0.7)
Eosinophils Relative: 1 %
HCT: 26.5 % — ABNORMAL LOW (ref 36.0–46.0)
HEMOGLOBIN: 8.7 g/dL — AB (ref 12.0–15.0)
LYMPHS PCT: 31 %
Lymphs Abs: 2.1 10*3/uL (ref 0.7–4.0)
MCH: 25.5 pg — ABNORMAL LOW (ref 26.0–34.0)
MCHC: 32.8 g/dL (ref 30.0–36.0)
MCV: 77.7 fL — AB (ref 78.0–100.0)
Monocytes Absolute: 1 10*3/uL (ref 0.1–1.0)
Monocytes Relative: 15 %
NEUTROS PCT: 53 %
Neutro Abs: 3.5 10*3/uL (ref 1.7–7.7)
PLATELETS: 226 10*3/uL (ref 150–400)
RBC: 3.41 MIL/uL — AB (ref 3.87–5.11)
RDW: 14.9 % (ref 11.5–15.5)
WBC: 6.7 10*3/uL (ref 4.0–10.5)

## 2018-01-15 LAB — COMPREHENSIVE METABOLIC PANEL
ALK PHOS: 98 U/L (ref 38–126)
ALT: 19 U/L (ref 0–44)
AST: 28 U/L (ref 15–41)
Albumin: 2.8 g/dL — ABNORMAL LOW (ref 3.5–5.0)
Anion gap: 8 (ref 5–15)
BUN: 19 mg/dL (ref 8–23)
CALCIUM: 8.8 mg/dL — AB (ref 8.9–10.3)
CO2: 24 mmol/L (ref 22–32)
CREATININE: 1.18 mg/dL — AB (ref 0.44–1.00)
Chloride: 105 mmol/L (ref 98–111)
GFR calc non Af Amer: 47 mL/min — ABNORMAL LOW (ref >60)
GFR, EST AFRICAN AMERICAN: 55 mL/min — AB (ref >60)
GLUCOSE: 297 mg/dL — AB (ref 70–99)
Potassium: 3.3 mmol/L — ABNORMAL LOW (ref 3.5–5.1)
SODIUM: 137 mmol/L (ref 135–145)
Total Bilirubin: 0.5 mg/dL (ref 0.3–1.2)
Total Protein: 5.4 g/dL — ABNORMAL LOW (ref 6.5–8.1)

## 2018-01-15 LAB — HEMOGLOBIN A1C
Hgb A1c MFr Bld: 11.8 % — ABNORMAL HIGH (ref 4.8–5.6)
Mean Plasma Glucose: 291.96 mg/dL

## 2018-01-15 LAB — TSH: TSH: 6.56 u[IU]/mL — ABNORMAL HIGH (ref 0.350–4.500)

## 2018-01-15 MED ORDER — CLONAZEPAM 0.5 MG PO TABS: 0.5000 mg | ORAL_TABLET | Freq: Two times a day (BID) | ORAL | 0 refills | Status: DC | PRN

## 2018-01-15 MED ORDER — HYDROCODONE-ACETAMINOPHEN 5-325 MG PO TABS: 1.0000 | ORAL_TABLET | Freq: Three times a day (TID) | ORAL | 0 refills | Status: DC | PRN

## 2018-01-15 NOTE — Telephone Encounter (Signed)
RX Fax for [facility]@ [phone]  

## 2018-01-17 ENCOUNTER — Encounter: Payer: Self-pay | Admitting: Internal Medicine

## 2018-01-17 ENCOUNTER — Encounter (HOSPITAL_COMMUNITY)
Admission: RE | Admit: 2018-01-17 | Discharge: 2018-01-17 | Disposition: A | Discharge status: Home / Self Care | Payer: Medicare Other | Source: Skilled Nursing Facility | Attending: Internal Medicine | Admitting: Internal Medicine

## 2018-01-17 ENCOUNTER — Non-Acute Institutional Stay (SKILLED_NURSING_FACILITY): Payer: Medicare Other | Admitting: Internal Medicine

## 2018-01-17 DIAGNOSIS — E039 Hypothyroidism, unspecified: Secondary | ICD-10-CM | POA: Diagnosis not present

## 2018-01-17 DIAGNOSIS — I10 Essential (primary) hypertension: Secondary | ICD-10-CM

## 2018-01-17 DIAGNOSIS — E1010 Type 1 diabetes mellitus with ketoacidosis without coma: Secondary | ICD-10-CM

## 2018-01-17 DIAGNOSIS — D631 Anemia in chronic kidney disease: Secondary | ICD-10-CM | POA: Insufficient documentation

## 2018-01-17 DIAGNOSIS — D649 Anemia, unspecified: Secondary | ICD-10-CM | POA: Insufficient documentation

## 2018-01-17 NOTE — Progress Notes (Signed)
Location:   [facility] Room Number: 129/P Place of Service:  SNF (31) Provider:Raiden Haydu Lyndel Safe MD  Terald Sleeper, PA-C  Patient Care Team: Theodoro Clock as PCP - General (General Practice) Garald Balding, MD as Consulting Physician (Orthopedic Surgery)  Extended Emergency Contact Information Primary Emergency Contact: [facility] Address: [address]          [place],  [address] [place] Phone: [phone] Relation: Sister  Code Status:  Full Code Goals of care: Advanced Directive information Advanced Directives 01/17/2018  Does Patient Have a Medical Advance Directive? Yes  Type of Advance Directive (No Data)  Does patient want to make changes to medical advance directive? No - Patient declined  Copy of [place] in Chart? -  Would patient like information on creating a medical advance directive? -     Chief Complaint  Patient presents with  . Acute Visit    Patient is being seen for F/U Blood sugars and Anemia    HPI:  Pt is a 67 y.o. female seen today for an acute visit   She was admitted to SNF  after staying in the hospital from 07/30-08/04 for diabetic ketoacidosis  Patient has a history of hypertension, diabetes, hyperlipidemia, palpitations, anemia, depression and anxiety She was brought to the emergency room by her family members as she was confused and was dizzy.  Her initial sugar was 569, lactic acid was 2.9 and she had Ketones in her Urine.Patient was admitted to ICU and was started on insulin drip and fluids. Her mental status improved. She is now in SNF for therapy. Patient's mental status is back to baseline.  She is already ambulating with mild assist. Patient was started on Lantus and her BS in mornings are running more then 200. She also had drop in her Hgb but denies any Blood in her stool. No Abdominal Pain , Nausea or vomiting. She does c/o Occasional dizziness when she stands  up . Also c/o swelling in her Legs with recent worsening.  Denies any shortness of breath, chest pain, cough  Past Medical History:  Diagnosis Date  . Allergic rhinitis   . Allergy   . Anemia   . Ankle swelling   . Anxiety    takes Xanax daily   . Arthritis   . Asthma   . Blurred vision, bilateral   . Chronic kidney disease   . Depression    takes Celexa daily  . Diabetes mellitus without complication ([place])    type II; takes Actos daily  . GERD (gastroesophageal reflux disease)   . Gout   . Heart murmur   . Heart palpitations   . Hemorrhoids   . History of blood transfusion    no abnormal reaction noted  . History of bronchitis   . Hyperlipidemia   . Hypertension    takes Catapres and Quinapril daily  . Hypothyroidism    takes Synthroid daily  . Moderate mitral insufficiency    a. 12/2014 Echo: EF 55-60%, mod MR, mildly to moderately dil LA, PASP 78mmHg.  . Osteoarthritis   . Paroxysmal supraventricular tachycardia ([place])   . Ringing in ears   . Shortness of breath dyspnea    ALbuterol daily as needed  . Stevens-Johnson disease ([place])   . Urinary incontinence    Past Surgical History:  Procedure Laterality Date  . ABDOMINAL HYSTERECTOMY    . BOWEL RESECTION    . COLON SURGERY    .  COLONOSCOPY    . ESOPHAGOGASTRODUODENOSCOPY    . HERNIA REPAIR     hiatal hernia repair  . TOTAL HIP ARTHROPLASTY Left 02/02/2015   Procedure: LEFT TOTAL HIP ARTHROPLASTY;  Surgeon: Garald Balding, MD;  Location: [facility];  Service: Orthopedics;  Laterality: Left;  . TOTAL HIP ARTHROPLASTY Right 02/01/2016   Procedure: TOTAL HIP ARTHROPLASTY;  Surgeon: Garald Balding, MD;  Location: [facility];  Service: Orthopedics;  Laterality: Right;    Allergies  Allergen Reactions  . Other Itching, Nausea Only and Other (See Comments)    Acidic foods cause stomach upset and itching  . Allopurinol Rash  . Penicillins Rash    Name brand  . Sulfa Antibiotics Rash    Outpatient Encounter  Medications as of 01/17/2018  Medication Sig  . acetaminophen (TYLENOL) 325 MG tablet Take 650 mg by mouth every 4 (four) hours as needed.  [name] albuterol (PROVENTIL HFA;VENTOLIN HFA) 108 (90 Base) MCG/ACT inhaler Inhale 2 puffs into the lungs 2 (two) times daily as needed for wheezing or shortness of breath.  [name] amLODipine (NORVASC) 10 MG tablet Take 10 mg by mouth daily.  . cloNIDine (CATAPRES) 0.1 MG tablet Take 0.1 mg by mouth 2 (two) times daily.  . ergocalciferol (VITAMIN D2) 50000 units capsule Give 1 tablet by mouth on Tues. and Fri. once a day  . fluticasone (FLONASE) 50 MCG/ACT nasal spray Place 1 spray into both nostrils 2 (two) times daily as needed for allergies or rhinitis.  [name] glucose blood test strip Test blood sugars twice a day  . HYDROcodone-acetaminophen (NORCO/VICODIN) 5-325 MG tablet Take 1 tablet by mouth every 8 (eight) hours as needed for moderate pain.  [name] insulin glargine (LANTUS) 100 UNIT/ML injection Inject 20 Units into the skin at bedtime.  . Insulin Lispro Prot & Lispro (HUMALOG MIX 75/25 KWIKPEN ) Give from 3-15 units per sliding scale three times a day  . levothyroxine (SYNTHROID, LEVOTHROID) 88 MCG tablet Take 88 mcg by mouth daily before breakfast.  . Magnesium 400 MG TABS Take 400 mg by mouth daily.  . metoprolol tartrate (LOPRESSOR) 100 MG tablet Take 100 mg by mouth 2 (two) times daily.  . montelukast (SINGULAIR) 10 MG tablet TAKE 1 TABLET BY MOUTH ONCE DAILY IN THE EVENING  . omeprazole (PRILOSEC) 40 MG capsule Take 40 mg by mouth daily.  . potassium chloride SA (K-DUR,KLOR-CON) 20 MEQ tablet Take 20 mEq by mouth daily.  . simvastatin (ZOCOR) 40 MG tablet Take 20 mg by mouth daily.  [name] umeclidinium-vilanterol (ANORO ELLIPTA) 62.5-25 MCG/INH AEPB Inhale 1 puff into the lungs daily.  . valsartan-hydrochlorothiazide (DIOVAN-HCT) 320-25 MG tablet Take 1 tablet by mouth daily.  . [DISCONTINUED] budesonide-formoterol (SYMBICORT) 80-4.5 MCG/ACT inhaler Inhale 2 puffs  into the lungs 2 (two) times daily.  . [DISCONTINUED] citalopram (CELEXA) 20 MG tablet Take 0.5 tablets (10 mg total) by mouth daily.  . [DISCONTINUED] clonazePAM (KLONOPIN) 0.5 MG tablet Take 1 tablet (0.5 mg total) by mouth 2 (two) times daily as needed for anxiety. Give for 14 days and then MD to reassess need from 01/11/2018-01/24/2018  . [DISCONTINUED] insulin lispro (HUMALOG KWIKPEN) 100 UNIT/ML KiwkPen Give per sliding scale  . [DISCONTINUED] oxyCODONE-acetaminophen (PERCOCET/ROXICET) 5-325 MG tablet Take 1 tablet by mouth 2 (two) times daily as needed for severe pain.   No facility-administered encounter medications on file as of 01/17/2018.      Review of Systems  Constitutional: Positive for fatigue.  HENT: Negative.   Respiratory: Negative.  Cardiovascular: Positive for leg swelling.  Gastrointestinal: Negative.   Genitourinary: Negative.   Musculoskeletal: Negative.   Skin: Negative.   Neurological: Positive for dizziness and weakness.  Psychiatric/Behavioral: Negative.     Immunization History  Administered Date(s) Administered  . Influenza,inj,Quad PF,6+ Mos 04/13/2016  . Pneumococcal Polysaccharide-23 02/04/2016   Pertinent  Health Maintenance Due  Topic Date Due  . FOOT EXAM  02/14/2018 (Originally 01/30/1961)  . MAMMOGRAM  02/14/2018 (Originally 11/23/2013)  . PAP SMEAR  02/14/2018 (Originally 01/30/1973)  . OPHTHALMOLOGY EXAM  02/14/2018 (Originally 01/30/1961)  . DEXA SCAN  02/14/2018 (Originally 01/30/2017)  . PNA vac Low Risk Adult (1 of 2 - PCV13) 02/14/2018 (Originally 02/03/2017)  . INFLUENZA VACCINE  05/07/2018 (Originally 01/10/2018)  . HEMOGLOBIN A1C  07/18/2018  . URINE MICROALBUMIN  11/24/2018  . COLONOSCOPY  11/19/2019   Fall Risk  02/27/2017 08/16/2016 04/24/2016 04/13/2016  Falls in the past year? No No No No   Functional Status Survey:    Vitals:   01/17/18 1359  BP: 113/73  Pulse: 84  Resp: (!) 22  Temp: (!) 97.4 F (36.3 C)   There is no  height or weight on file to calculate BMI. Physical Exam  Constitutional: She is oriented to person, place, and time. She appears well-developed and well-nourished.  HENT:  Head: Normocephalic.  Mouth/Throat: Oropharynx is clear and moist.  Eyes: Pupils are equal, round, and reactive to light.  Neck: Neck supple.  Cardiovascular: Normal rate and regular rhythm.  Murmur heard. Pulmonary/Chest: Effort normal and breath sounds normal. No stridor. No respiratory distress. She has no wheezes.  Abdominal: Soft. Bowel sounds are normal. She exhibits no distension. There is no tenderness. There is no guarding.  Musculoskeletal:  Moderate Edema Bilateral  Neurological: She is alert and oriented to person, place, and time.  Skin: Skin is warm and dry.  Psychiatric: She has a normal mood and affect. Her behavior is normal. Thought content normal.    Labs reviewed: Recent Labs    11/23/17 1051 01/12/18 1030 01/15/18 0315  NA 140 138 137  K 4.0 3.3* 3.3*  CL 101 111 105  CO2 20 18* 24  GLUCOSE 135* 302* 297*  BUN 23 19 19   CREATININE 1.15* 1.36* 1.18*  CALCIUM 10.0 8.7* 8.8*   Recent Labs    01/30/17 1159 11/23/17 1051 01/15/18 0315  AST 24 14 28   ALT 12 8 19   ALKPHOS 138* 174* 98  BILITOT 0.2 0.3 0.5  PROT 7.3 7.5 5.4*  ALBUMIN 4.3 4.5 2.8*   Recent Labs    11/23/17 1051 01/12/18 1030 01/15/18 0315  WBC 5.8 7.7 6.7  NEUTROABS 3.9 5.8 3.5  HGB 12.0 10.9* 8.7*  HCT 37.6 32.1* 26.5*  MCV 82 75.7* 77.7*  PLT 248 177 226   Lab Results  Component Value Date   TSH 6.560 (H) 01/15/2018   Lab Results  Component Value Date   HGBA1C 11.8 (H) 01/15/2018   Lab Results  Component Value Date   CHOL 159 11/23/2017   HDL 31 (L) 11/23/2017   LDLCALC 99 11/23/2017   TRIG 143 11/23/2017   CHOLHDL 5.1 (H) 11/23/2017    Significant Diagnostic Results in last 30 days:  No results found.  Assessment/Plan    Family/ staff Communication:  Diabetes Ketoacidosis Patient  was started on Humalog mix in the hospital twice a day Changed it to the long-acting Lantus  Will increase it to Lantus 25 units and Novolog bolus 10 units with Lunch. Continue QAC  and HS nightly Accu-Cheks Discontinue  sliding scale Diet and Insulin Education  Patient aware that her A1C was 11.8  LE edema Has been on Lasix 40 mg before. It was discontinued in the hospital We will restart her on Lasix. Dopplers in the facilities were negative for any DVT Hypokalemia We will supplement Discontinue HCTZ Repeat BMP Essential hypertension In the Hospital Patient was started on Diovan , And Norvasc and dose of Lopressor was increased She was taken off Hydralazine Will Discontinue  Diovan Restart her Lasix and Accupril Dizziness We will do orthostatics and discontinue clonidine  Hypothyroidism, Will repeat TSH was mildly high Continue Levothyroxine same dose for now as it seems it was increased in the hospital Repeat TSH in 6 weeks  Anxiety Patient says she does not want to take Celexa or Klonopin So it was  Discontinued H/o Mild Asthma On Singulair And Symbicort changed to Anoro per Patient request LE pain Oxycodone discontinued and started on Norco Prn  Will consider Neurontin if pain persist Anemia Patient's hemoglobin is down to 8.7. We will check stools for occult blood to iron studies B12 and folate  Hyperlipidemia On simvastatin  Follows with PCP [facility] ' Started Therapy Plans for going home.  Labs/tests ordered:   Total time spent in this patient care encounter was 45_ minutes; greater than 50% of the visit spent counseling patient, reviewing records , Labs and coordinating care for problems addressed at this encounter.

## 2018-01-18 ENCOUNTER — Non-Acute Institutional Stay (SKILLED_NURSING_FACILITY): Payer: Medicare Other | Admitting: Internal Medicine

## 2018-01-18 ENCOUNTER — Encounter: Payer: Self-pay | Admitting: Internal Medicine

## 2018-01-18 DIAGNOSIS — E1010 Type 1 diabetes mellitus with ketoacidosis without coma: Secondary | ICD-10-CM

## 2018-01-18 DIAGNOSIS — I10 Essential (primary) hypertension: Secondary | ICD-10-CM

## 2018-01-18 DIAGNOSIS — G4700 Insomnia, unspecified: Secondary | ICD-10-CM | POA: Diagnosis not present

## 2018-01-18 LAB — OCCULT BLOOD X 1 CARD TO LAB, STOOL: FECAL OCCULT BLD: NEGATIVE

## 2018-01-18 NOTE — Progress Notes (Signed)
Location:    [facility] Room Number: 129/P Place of Service:  SNF (31) Provider:  Derinda Late, PA-C  Patient Care Team: Theodoro Clock as PCP - General (General Practice) Garald Balding, MD as Consulting Physician (Orthopedic Surgery)  Extended Emergency Contact Information Primary Emergency Contact: [facility] Address: [address]          [place], [place] [address] [place] Phone: [phone] Relation: Sister  Code Status:  Full Code Goals of care: Advanced Directive information Advanced Directives 01/18/2018  Does Patient Have a Medical Advance Directive? Yes  Type of Advance Directive (No Data)  Does patient want to make changes to medical advance directive? No - Patient declined  Copy of [place] in Chart? -  Would patient like information on creating a medical advance directive? -     Chief Complaint  Patient presents with  . Acute Visit    Patient is being seen for Insomnia    HPI:  Pt is a 67 y.o. female seen today for an acute visit for complaints of insomnia-  Patient is here for rehab after hospitalization for diabetic ketoacidosis.  She also has a history of hypertension hyperlipidemia palpitations anemia depression and anxiety.  She was seen by Dr. Lyndel Safe yesterday and her Lantus was increased secondary to elevated AM sugars over 200 sugar this morning was 173- Her NovoLog at noon also was ichanged to 10 units if blood sugar was greater than 150.  She also was noted to have some increased lower extremity edema she has been restarted on Lasix 40 mg a day.  She also had mild hypokalemia and this is being supplemented with update labs pending for next week.  Hemoglobin also has gone somewhat lower at 8.7 and occult blood testing so far has been negative- further labs are pending next week  Regards to hypertension she was started on given in the hospital her Norvasc and  Lopressor worse increased- the open has been discontinued and again her Lasix have been restarted she is also now on Accupril as well. Her clonidine has been discontinued  Blood pressure today was somewhat variable I got systolics ranging from the high 90s to the low 062I diastolic between 60 and 70- this was done both sitting and standing-it appeared fairly comparable despite positions.  Currently she is her only complaint basically is insomnia at night- she states she had been on alprazolam apparently at night at one point but stopped taking it because she was afraid she was becoming dependent on it-- she also gives somewhat of a vague history that she was having hallucinations.  She says she is tried melatonin about a year ago but did not appear to be all that effective-she does not really want to be on any "strong nighttime medicine"   Currently she is sitting on the side of her bed comfortably- says at times when she stands up she will get a little dizzy for short period but does not really describe this as a new issue--again orthostatics today showed relative stability        Past Medical History:  Diagnosis Date  . Allergic rhinitis   . Allergy   . Anemia   . Ankle swelling   . Anxiety    takes Xanax daily   . Arthritis   . Asthma   . Blurred vision, bilateral   . Chronic kidney disease   . Depression  takes Celexa daily  . Diabetes mellitus without complication ([place])    type II; takes Actos daily  . GERD (gastroesophageal reflux disease)   . Gout   . Heart murmur   . Heart palpitations   . Hemorrhoids   . History of blood transfusion    no abnormal reaction noted  . History of bronchitis   . Hyperlipidemia   . Hypertension    takes Catapres and Quinapril daily  . Hypothyroidism    takes Synthroid daily  . Moderate mitral insufficiency    a. 12/2014 Echo: EF 55-60%, mod MR, mildly to moderately dil LA, PASP 72mmHg.  . Osteoarthritis   . Paroxysmal  supraventricular tachycardia ([place])   . Ringing in ears   . Shortness of breath dyspnea    ALbuterol daily as needed  . Stevens-Johnson disease ([place])   . Urinary incontinence    Past Surgical History:  Procedure Laterality Date  . ABDOMINAL HYSTERECTOMY    . BOWEL RESECTION    . COLON SURGERY    . COLONOSCOPY    . ESOPHAGOGASTRODUODENOSCOPY    . HERNIA REPAIR     hiatal hernia repair  . TOTAL HIP ARTHROPLASTY Left 02/02/2015   Procedure: LEFT TOTAL HIP ARTHROPLASTY;  Surgeon: Garald Balding, MD;  Location: [facility];  Service: Orthopedics;  Laterality: Left;  . TOTAL HIP ARTHROPLASTY Right 02/01/2016   Procedure: TOTAL HIP ARTHROPLASTY;  Surgeon: Garald Balding, MD;  Location: [facility];  Service: Orthopedics;  Laterality: Right;    Allergies  Allergen Reactions  . Other Itching, Nausea Only and Other (See Comments)    Acidic foods cause stomach upset and itching  . Allopurinol Rash  . Penicillins Rash    Name brand  . Sulfa Antibiotics Rash    Outpatient Encounter Medications as of 01/18/2018  Medication Sig  . acetaminophen (TYLENOL) 325 MG tablet Take 650 mg by mouth every 4 (four) hours as needed.  [name] albuterol (PROVENTIL HFA;VENTOLIN HFA) 108 (90 Base) MCG/ACT inhaler Inhale 1 puff into the lungs daily.  [name] amLODipine (NORVASC) 10 MG tablet Take 10 mg by mouth daily.  . ergocalciferol (VITAMIN D2) 50000 units capsule Give 1 tablet by mouth on Tues. and Fri. once a day  . fluticasone (FLONASE) 50 MCG/ACT nasal spray Place 1 spray into both nostrils 2 (two) times daily as needed for allergies or rhinitis.  . furosemide (LASIX) 40 MG tablet Take 40 mg by mouth daily.  [name] glucose blood test strip Test blood sugars twice a day  . HYDROcodone-acetaminophen (NORCO/VICODIN) 5-325 MG tablet Take 1 tablet by mouth every 8 (eight) hours as needed for moderate pain.  [name] insulin aspart (NOVOLOG) 100 UNIT/ML injection Inject 10 Units into the skin daily.  . insulin glargine (LANTUS) 100  UNIT/ML injection Inject 25 Units into the skin at bedtime.   [name] levothyroxine (SYNTHROID, LEVOTHROID) 88 MCG tablet Take 88 mcg by mouth daily before breakfast.  . lip balm (CARMEX) ointment Apply 1 application topically 2 (two) times daily as needed for lip care.  . Magnesium 400 MG TABS Take 400 mg by mouth daily.  . metoprolol tartrate (LOPRESSOR) 100 MG tablet Take 100 mg by mouth 2 (two) times daily.  . montelukast (SINGULAIR) 10 MG tablet TAKE 1 TABLET BY MOUTH ONCE DAILY IN THE EVENING  . omeprazole (PRILOSEC) 40 MG capsule Take 40 mg by mouth daily.  . potassium chloride SA (K-DUR,KLOR-CON) 20 MEQ tablet Take 40 mEq by mouth 2 (two) times daily.   [name]  potassium chloride SA (K-DUR,KLOR-CON) 20 MEQ tablet Take 40 mEq by mouth daily.  . quinapril (ACCUPRIL) 40 MG tablet Take 40 mg by mouth daily.  . simvastatin (ZOCOR) 40 MG tablet Take 20 mg by mouth daily.  [name] umeclidinium-vilanterol (ANORO ELLIPTA) 62.5-25 MCG/INH AEPB Inhale 1 puff into the lungs daily.  . [DISCONTINUED] albuterol (PROVENTIL HFA;VENTOLIN HFA) 108 (90 Base) MCG/ACT inhaler Inhale 2 puffs into the lungs 2 (two) times daily as needed for wheezing or shortness of breath. (Patient taking differently: Inhale 2 puffs into the lungs daily. )  . [DISCONTINUED] cloNIDine (CATAPRES) 0.1 MG tablet Take 0.1 mg by mouth 2 (two) times daily.  . [DISCONTINUED] Insulin Lispro Prot & Lispro (HUMALOG MIX 75/25 KWIKPEN [place]) Give from 3-15 units per sliding scale three times a day  . [DISCONTINUED] valsartan-hydrochlorothiazide (DIOVAN-HCT) 320-25 MG tablet Take 1 tablet by mouth daily.   No facility-administered encounter medications on file as of 01/18/2018.     Review of Systems   In general she is not complaining of any fever or chills.  Skin is not complain of rashes itching or diaphoresis.  Head ears eyes nose mouth and throat is not complaining of any sore throat or visual changes.  Respiratory is not complaining of shortness of  breath or cough.  Cardiac is not complaining of chest pain has some moderate lower extremity edema as noted above.  GI is not complaining of abdominal pain nausea vomiting diarrhea or constipation.  Musculoskeletal does complain of some weakness but is not complaining of joint pain currently.  She says the Norco does help with any joint pain.  Neurologic is not complaining of headache does complain of some slight dizziness when she stands up- but does does not describe this as totally new.  Psych did not complain of being anxious or depressed does feel she has trouble sleeping at night  Immunization History  Administered Date(s) Administered  . Influenza,inj,Quad PF,6+ Mos 04/13/2016  . Pneumococcal Polysaccharide-23 02/04/2016   Pertinent  Health Maintenance Due  Topic Date Due  . FOOT EXAM  02/14/2018 (Originally 01/30/1961)  . MAMMOGRAM  02/14/2018 (Originally 11/23/2013)  . PAP SMEAR  02/14/2018 (Originally 01/30/1973)  . OPHTHALMOLOGY EXAM  02/14/2018 (Originally 01/30/1961)  . DEXA SCAN  02/14/2018 (Originally 01/30/2017)  . PNA vac Low Risk Adult (1 of 2 - PCV13) 02/14/2018 (Originally 02/03/2017)  . INFLUENZA VACCINE  05/07/2018 (Originally 01/10/2018)  . HEMOGLOBIN A1C  07/18/2018  . URINE MICROALBUMIN  11/24/2018  . COLONOSCOPY  11/19/2019   Fall Risk  02/27/2017 08/16/2016 04/24/2016 04/13/2016  Falls in the past year? No No No No   Functional Status Survey:    Vitals:   01/18/18 1144  BP: (!) 104/59  Pulse: 75  Resp: 16  Temp: 98.7 F (37.1 C)  TempSrc: Oral  SpO2: 99%   Of note manual blood pressure sitting appeared to average systolically around 417 with diastolic of 60- standing was comparable actually a little bit higher systolically around 408 Physical Exam  In general this is a very pleasant female in no distress sitting comfortably on the side of her bed.  Her skin is warm and dry.  Eyes visual acuity appears to be intact.  Chest is clear to auscultation  there is no labored breathing.  Heart is largely regular rate and rhythm with a systolic murmur- she has mild/moderate lower extremity edema.  Abdomen is soft nontender with positive bowel sounds.  Musculoskeletal moves all extremities times baseline is able to stand without  assistance strength appears to be intact all 4 extremities.  Neurologic is grossly intact her speech is clear.  Psych she is alert and oriented pleasant and appropriate   Restriction 5 Labs reviewed: Recent Labs    11/23/17 1051 01/12/18 1030 01/15/18 0315  NA 140 138 137  K 4.0 3.3* 3.3*  CL 101 111 105  CO2 20 18* 24  GLUCOSE 135* 302* 297*  BUN 23 19 19   CREATININE 1.15* 1.36* 1.18*  CALCIUM 10.0 8.7* 8.8*   Recent Labs    01/30/17 1159 11/23/17 1051 01/15/18 0315  AST 24 14 28   ALT 12 8 19   ALKPHOS 138* 174* 98  BILITOT 0.2 0.3 0.5  PROT 7.3 7.5 5.4*  ALBUMIN 4.3 4.5 2.8*   Recent Labs    11/23/17 1051 01/12/18 1030 01/15/18 0315  WBC 5.8 7.7 6.7  NEUTROABS 3.9 5.8 3.5  HGB 12.0 10.9* 8.7*  HCT 37.6 32.1* 26.5*  MCV 82 75.7* 77.7*  PLT 248 177 226   Lab Results  Component Value Date   TSH 6.560 (H) 01/15/2018   Lab Results  Component Value Date   HGBA1C 11.8 (H) 01/15/2018   Lab Results  Component Value Date   CHOL 159 11/23/2017   HDL 31 (L) 11/23/2017   LDLCALC 99 11/23/2017   TRIG 143 11/23/2017   CHOLHDL 5.1 (H) 11/23/2017    Significant Diagnostic Results in last 30 days:  No results found.  Assessment/Plan  1-diabetes type 1- her Lantus ihasng was 173 which apparently has shown some improvement at this point continue to monitor again is receiving the Humalog at noon at 10 units this will need to be watched.  2.-  Hypertension- again she has had recent changes she is now on Accupril 40 mg a day in addition to Lasix 40 mg a day and Lopressor 100 mg twice daily as well as Norvasc 10 mg a day- she is no longer on the Diovan or clonidine- At times appears to have  some borderline low blood pressures- decrease Accupril down to 20 mg a day and hold for systolic less than [id number] to monitor. Blood pressure and pulse Qshift   3.  Insomnia I did have a fairly extensive discussion with her she says she does not do well with stronger medicines- does not want to take Ambien or anything related to that- does state on alprazolam she did have some hallucinations- we agreed to do a short course of melatonin to see if it will be effective this time and  reevaluate if not effective-and she expressed agreement with this.   #4 anemia-hemoglobin has gone down to 8.7- occult blood testing so far has been negative-update labs are pending next week including B12 and iron studies.  Clinically appears to be stable.  [id number]

## 2018-01-21 ENCOUNTER — Encounter: Payer: Self-pay | Admitting: Internal Medicine

## 2018-01-21 ENCOUNTER — Non-Acute Institutional Stay (SKILLED_NURSING_FACILITY): Payer: Medicare Other | Admitting: Internal Medicine

## 2018-01-21 DIAGNOSIS — E1010 Type 1 diabetes mellitus with ketoacidosis without coma: Secondary | ICD-10-CM

## 2018-01-21 DIAGNOSIS — D649 Anemia, unspecified: Secondary | ICD-10-CM

## 2018-01-21 DIAGNOSIS — E039 Hypothyroidism, unspecified: Secondary | ICD-10-CM

## 2018-01-21 DIAGNOSIS — I10 Essential (primary) hypertension: Secondary | ICD-10-CM

## 2018-01-21 NOTE — Progress Notes (Signed)
Location:    [facility] Room Number: 129/P Place of Service:  SNF (31)  Provider:Anjali Lyndel Safe MD  PCP: Terald Sleeper, PA-C Patient Care Team: Theodoro Clock as PCP - General (General Practice) Garald Balding, MD as Consulting Physician (Orthopedic Surgery)  Extended Emergency Contact Information Primary Emergency Contact: [facility] Address: [address]          [place], [place] [address] [place] Phone: [phone] Relation: Sister  Code Status: Full Code Goals of care:  Advanced Directive information Advanced Directives 01/21/2018  Does Patient Have a Medical Advance Directive? Yes  Type of Advance Directive (No Data)  Does patient want to make changes to medical advance directive? No - Patient declined  Copy of [place] in Chart? -  Would patient like information on creating a medical advance directive? -     Allergies  Allergen Reactions  . Other Itching, Nausea Only and Other (See Comments)    Acidic foods cause stomach upset and itching  . Allopurinol Rash  . Penicillins Rash    Name brand  . Sulfa Antibiotics Rash    Chief Complaint  Patient presents with  . Discharge Note    Discharge Visit    HPI:  67 y.o. female today for Discharge from the Facility. She was admitted to SNF  after staying in the hospital from 07/30-08/04 for diabetic ketoacidosis  Patient has a history of hypertension, diabetes, Hypothyroidism, hyperlipidemia, palpitations, anemia, depression and anxiety She was brought to the emergency room by her family members as she was confused and was dizzy.  Her initial sugar was 569, lactic acid was 2.9 and she had Ketones in her Urine.Patient was admitted to ICU and was started on insulin drip and fluids. Her mental status improved. In the facility patient patient did well with therapy and was walking with a cane.  She was also taught to give herself insulin shot. Her Humalog  was changed to Lantus nightly with NovoLog bolus with Meals. Her blood sugars in facility is varied from Fasting 150 to 250 at night. Patient also had some swelling in her lower extremity and was restarted on her home Lasix Her blood pressure medicine was changed from Diovan to plain Accupril. He also had Dopplers done which were negative for any DVT And is planning to go home with her sister.  Past Medical History:  Diagnosis Date  . Allergic rhinitis   . Allergy   . Anemia   . Ankle swelling   . Anxiety    takes Xanax daily   . Arthritis   . Asthma   . Blurred vision, bilateral   . Chronic kidney disease   . Depression    takes Celexa daily  . Diabetes mellitus without complication ([place])    type II; takes Actos daily  . GERD (gastroesophageal reflux disease)   . Gout   . Heart murmur   . Heart palpitations   . Hemorrhoids   . History of blood transfusion    no abnormal reaction noted  . History of bronchitis   . Hyperlipidemia   . Hypertension    takes Catapres and Quinapril daily  . Hypothyroidism    takes Synthroid daily  . Moderate mitral insufficiency    a. 12/2014 Echo: EF 55-60%, mod MR, mildly to moderately dil LA, PASP 69mmHg.  . Osteoarthritis   . Paroxysmal supraventricular tachycardia ([place])   . Ringing in ears   . Shortness  of breath dyspnea    ALbuterol daily as needed  . Stevens-Johnson disease ([place])   . Urinary incontinence     Past Surgical History:  Procedure Laterality Date  . ABDOMINAL HYSTERECTOMY    . BOWEL RESECTION    . COLON SURGERY    . COLONOSCOPY    . ESOPHAGOGASTRODUODENOSCOPY    . HERNIA REPAIR     hiatal hernia repair  . TOTAL HIP ARTHROPLASTY Left 02/02/2015   Procedure: LEFT TOTAL HIP ARTHROPLASTY;  Surgeon: Garald Balding, MD;  Location: [facility];  Service: Orthopedics;  Laterality: Left;  . TOTAL HIP ARTHROPLASTY Right 02/01/2016   Procedure: TOTAL HIP ARTHROPLASTY;  Surgeon: Garald Balding, MD;  Location: [facility];  Service:  Orthopedics;  Laterality: Right;      reports that she has never smoked. She has never used smokeless tobacco. She reports that she does not drink alcohol or use drugs. Social History   Socioeconomic History  . Marital status: Single    Spouse name: Not on file  . Number of children: Not on file  . Years of education: Not on file  . Highest education level: Not on file  Occupational History  . Not on file  Social Needs  . Financial resource strain: Not on file  . Food insecurity:    Worry: Not on file    Inability: Not on file  . Transportation needs:    Medical: Not on file    Non-medical: Not on file  Tobacco Use  . Smoking status: Never Smoker  . Smokeless tobacco: Never Used  Substance and Sexual Activity  . Alcohol use: No    Alcohol/week: 0.0 standard drinks  . Drug use: No  . Sexual activity: Not on file  Lifestyle  . Physical activity:    Days per week: Not on file    Minutes per session: Not on file  . Stress: Not on file  Relationships  . Social connections:    Talks on phone: Not on file    Gets together: Not on file    Attends religious service: Not on file    Active member of club or organization: Not on file    Attends meetings of clubs or organizations: Not on file    Relationship status: Not on file  . Intimate partner violence:    Fear of current or ex partner: Not on file    Emotionally abused: Not on file    Physically abused: Not on file    Forced sexual activity: Not on file  Other Topics Concern  . Not on file  Social History Narrative  . Not on file   Functional Status Survey:    Allergies  Allergen Reactions  . Other Itching, Nausea Only and Other (See Comments)    Acidic foods cause stomach upset and itching  . Allopurinol Rash  . Penicillins Rash    Name brand  . Sulfa Antibiotics Rash    Pertinent  Health Maintenance Due  Topic Date Due  . FOOT EXAM  02/14/2018 (Originally 01/30/1961)  . MAMMOGRAM  02/14/2018 (Originally  11/23/2013)  . PAP SMEAR  02/14/2018 (Originally 01/30/1973)  . OPHTHALMOLOGY EXAM  02/14/2018 (Originally 01/30/1961)  . DEXA SCAN  02/14/2018 (Originally 01/30/2017)  . PNA vac Low Risk Adult (1 of 2 - PCV13) 02/14/2018 (Originally 02/03/2017)  . INFLUENZA VACCINE  05/07/2018 (Originally 01/10/2018)  . HEMOGLOBIN A1C  07/18/2018  . URINE MICROALBUMIN  11/24/2018  . COLONOSCOPY  11/19/2019    Medications:  Outpatient Encounter Medications as of 01/21/2018  Medication Sig  . acetaminophen (TYLENOL) 325 MG tablet Take 650 mg by mouth every 4 (four) hours as needed.  [name] albuterol (PROVENTIL HFA;VENTOLIN HFA) 108 (90 Base) MCG/ACT inhaler Inhale 1 puff into the lungs daily.  [name] amLODipine (NORVASC) 10 MG tablet Take 10 mg by mouth daily.  . ergocalciferol (VITAMIN D2) 50000 units capsule Give 1 tablet by mouth on Tues. and Fri. once a day  . fluticasone (FLONASE) 50 MCG/ACT nasal spray Place 1 spray into both nostrils 2 (two) times daily as needed for allergies or rhinitis.  . furosemide (LASIX) 40 MG tablet Take 40 mg by mouth daily.  [name] glucose blood test strip Test blood sugars twice a day  . HYDROcodone-acetaminophen (NORCO/VICODIN) 5-325 MG tablet Take 1 tablet by mouth every 8 (eight) hours as needed for moderate pain.  [name] insulin aspart (NOVOLOG) 100 UNIT/ML injection Inject 10 Units into the skin daily.  . insulin glargine (LANTUS) 100 UNIT/ML injection Inject 25 Units into the skin at bedtime.   [name] levothyroxine (SYNTHROID, LEVOTHROID) 88 MCG tablet Take 88 mcg by mouth daily before breakfast.  . lip balm (CARMEX) ointment Apply 1 application topically 2 (two) times daily as needed for lip care.  . Magnesium 400 MG TABS Take 400 mg by mouth daily.  . Melatonin 3 MG TABS Take 3 mg by mouth at bedtime as needed.  . metoprolol tartrate (LOPRESSOR) 100 MG tablet Take 100 mg by mouth 2 (two) times daily.  . montelukast (SINGULAIR) 10 MG tablet TAKE 1 TABLET BY MOUTH ONCE DAILY IN THE EVENING  .  omeprazole (PRILOSEC) 40 MG capsule Take 40 mg by mouth daily.  . potassium chloride SA (K-DUR,KLOR-CON) 20 MEQ tablet Take 40 mEq by mouth daily.  . quinapril (ACCUPRIL) 40 MG tablet Take 20 mg by mouth daily.   . simvastatin (ZOCOR) 40 MG tablet Take 20 mg by mouth daily.  [name] umeclidinium-vilanterol (ANORO ELLIPTA) 62.5-25 MCG/INH AEPB Inhale 1 puff into the lungs daily.  . [DISCONTINUED] potassium chloride SA (K-DUR,KLOR-CON) 20 MEQ tablet Take 40 mEq by mouth 2 (two) times daily.    No facility-administered encounter medications on file as of 01/21/2018.      Review of Systems  Vitals:   01/21/18 1551  BP: 116/78  Pulse: 68  Resp: 17  Temp: 98.1 F (36.7 C)   There is no height or weight on file to calculate BMI. Physical Exam  Constitutional: She is oriented to person, place, and time. She appears well-developed and well-nourished.  HENT:  Head: Normocephalic.  Mouth/Throat: Oropharynx is clear and moist.  Eyes: Pupils are equal, round, and reactive to light.  Neck: Neck supple.  Cardiovascular: Normal rate and regular rhythm.  Murmur heard. Pulmonary/Chest: Effort normal and breath sounds normal. No stridor. No respiratory distress. She has no wheezes.  Abdominal: Soft. Bowel sounds are normal. She exhibits no distension. There is no tenderness. There is no guarding.  Musculoskeletal:  Mild edema Bilateral  Neurological: She is alert and oriented to person, place, and time.  Skin: Skin is warm and dry.  Psychiatric: She has a normal mood and affect. Her behavior is normal. Thought content normal.    Labs reviewed: Basic Metabolic Panel: Recent Labs    11/23/17 1051 01/12/18 1030 01/15/18 0315  NA 140 138 137  K 4.0 3.3* 3.3*  CL 101 111 105  CO2 20 18* 24  GLUCOSE 135* 302* 297*  BUN 23 19 19   CREATININE  1.15* 1.36* 1.18*  CALCIUM 10.0 8.7* 8.8*   Liver Function Tests: Recent Labs    01/30/17 1159 11/23/17 1051 01/15/18 0315  AST 24 14 28   ALT 12 8  19   ALKPHOS 138* 174* 98  BILITOT 0.2 0.3 0.5  PROT 7.3 7.5 5.4*  ALBUMIN 4.3 4.5 2.8*   No results for input(s): LIPASE, AMYLASE in the last 8760 hours. No results for input(s): AMMONIA in the last 8760 hours. CBC: Recent Labs    11/23/17 1051 01/12/18 1030 01/15/18 0315  WBC 5.8 7.7 6.7  NEUTROABS 3.9 5.8 3.5  HGB 12.0 10.9* 8.7*  HCT 37.6 32.1* 26.5*  MCV 82 75.7* 77.7*  PLT 248 177 226   Cardiac Enzymes: No results for input(s): CKTOTAL, CKMB, CKMBINDEX, TROPONINI in the last 8760 hours. BNP: Invalid input(s): POCBNP CBG: No results for input(s): GLUCAP in the last 8760 hours.  Procedures and Imaging Studies During Stay: No results found.  Assessment/Plan:   Diabetes Ketoacidosis Patient is on Lantus nightly with NovoLog boluses with meal She will continue doing Accu-Cheks at home She was given diabetic education She was also taught how to give herself insulin shots She will be followed closely by her PCP Essential hypertension In the Hospital Patient was started on Diovan , And Norvasc and dose of Lopressor was increased She was taken off Hydralazine In the facility she was switched back to Lasix and Accupril The dose of Norvasc was decreased to 5 mg She also was checked for orthostatics due to her complaint of dizziness and they were negative Hypothyroidism, Patient's TSH was mildly elevated but her dose of Synthroid was not changed due to her previous history of hyperthyroidism due to medicines She will have a she will need repeat TSH in 6 weeks and dose would be adjusted accordingly by her PCP Leg edema Patient was restarted on Lasix  Edema improved Dopplers were negative for any DVT  Anxiety Patient says she does not want to take Celexa or Klonopin So they were discontinued H/o Mild Asthma On Singulair And Symbicort was changed to Anoro per Patient request LE pain Oxycodone discontinued and started on Norco Prn  Will consider Neurontin as  outpatient if pain persist Hyperlipidemia On simvastatin  Follows with PCP Anemia Her Occult was negative in the facility. Iron studies and B12 are pending She will need further work up of her  Anemia as outpatient Discharge Instructions She is going home with her sister She will be followed with her PCP in 1 week She would be also followed with Home health and therapy Is walking with Kasandra Knudsen   Future labs/tests needed:  BMP,CBC, TSH   Discharge Time of more then 30 min

## 2018-01-22 ENCOUNTER — Encounter (HOSPITAL_COMMUNITY)
Admission: RE | Admit: 2018-01-22 | Discharge: 2018-01-22 | Disposition: A | Discharge status: Home / Self Care | Payer: Medicare Other | Source: Skilled Nursing Facility | Attending: Internal Medicine | Admitting: Internal Medicine

## 2018-01-22 DIAGNOSIS — D631 Anemia in chronic kidney disease: Secondary | ICD-10-CM | POA: Insufficient documentation

## 2018-01-22 DIAGNOSIS — E785 Hyperlipidemia, unspecified: Secondary | ICD-10-CM | POA: Insufficient documentation

## 2018-01-22 DIAGNOSIS — I051 Rheumatic mitral insufficiency: Secondary | ICD-10-CM | POA: Insufficient documentation

## 2018-01-22 DIAGNOSIS — E1165 Type 2 diabetes mellitus with hyperglycemia: Secondary | ICD-10-CM | POA: Insufficient documentation

## 2018-01-22 DIAGNOSIS — F411 Generalized anxiety disorder: Secondary | ICD-10-CM | POA: Insufficient documentation

## 2018-01-22 DIAGNOSIS — F329 Major depressive disorder, single episode, unspecified: Secondary | ICD-10-CM | POA: Insufficient documentation

## 2018-01-22 LAB — CBC
HCT: 35.5 % — ABNORMAL LOW (ref 36.0–46.0)
Hemoglobin: 11.2 g/dL — ABNORMAL LOW (ref 12.0–15.0)
MCH: 26.2 pg (ref 26.0–34.0)
MCHC: 31.5 g/dL (ref 30.0–36.0)
MCV: 82.9 fL (ref 78.0–100.0)
PLATELETS: 237 10*3/uL (ref 150–400)
RBC: 4.28 MIL/uL (ref 3.87–5.11)
RDW: 17.1 % — ABNORMAL HIGH (ref 11.5–15.5)
WBC: 6.6 10*3/uL (ref 4.0–10.5)

## 2018-01-22 LAB — BASIC METABOLIC PANEL
ANION GAP: 7 (ref 5–15)
BUN: 16 mg/dL (ref 8–23)
CALCIUM: 9.5 mg/dL (ref 8.9–10.3)
CO2: 26 mmol/L (ref 22–32)
CREATININE: 1.21 mg/dL — AB (ref 0.44–1.00)
Chloride: 105 mmol/L (ref 98–111)
GFR calc non Af Amer: 46 mL/min — ABNORMAL LOW (ref >60)
GFR, EST AFRICAN AMERICAN: 53 mL/min — AB (ref >60)
Glucose, Bld: 254 mg/dL — ABNORMAL HIGH (ref 70–99)
Potassium: 4.8 mmol/L (ref 3.5–5.1)
SODIUM: 138 mmol/L (ref 135–145)

## 2018-01-22 LAB — FOLATE: FOLATE: 17.2 ng/mL (ref >5.9)

## 2018-01-22 LAB — IRON AND TIBC
IRON: 73 ug/dL (ref 28–170)
Saturation Ratios: 19 % (ref 10.4–31.8)
TIBC: 382 ug/dL (ref 250–450)
UIBC: 309 ug/dL

## 2018-01-22 LAB — VITAMIN B12: Vitamin B-12: 450 pg/mL (ref 180–914)

## 2018-01-22 LAB — FERRITIN: FERRITIN: 135 ng/mL (ref 11–307)

## 2018-01-23 ENCOUNTER — Telehealth: Payer: Self-pay

## 2018-01-23 ENCOUNTER — Other Ambulatory Visit: Payer: Self-pay

## 2018-01-23 ENCOUNTER — Other Ambulatory Visit: Payer: Self-pay | Admitting: Family Medicine

## 2018-01-23 MED ORDER — PEN NEEDLES 32G X 6 MM MISC: 1.0000 | Freq: Every day | 1 refills | Status: DC

## 2018-01-23 MED ORDER — INSULIN ASPART 100 UNIT/ML ~~LOC~~ SOLN: 10.0000 [IU] | Freq: Every day | SUBCUTANEOUS | 1 refills | Status: DC

## 2018-01-23 MED ORDER — UMECLIDINIUM-VILANTEROL 62.5-25 MCG/INH IN AEPB: 1.0000 | INHALATION_SPRAY | Freq: Every day | RESPIRATORY_TRACT | 4 refills | Status: DC

## 2018-01-23 MED ORDER — INSULIN GLARGINE 100 UNIT/ML ~~LOC~~ SOLN: 25.0000 [IU] | Freq: Every day | SUBCUTANEOUS | 1 refills | Status: DC

## 2018-01-23 MED ORDER — SIMVASTATIN 40 MG PO TABS: 20.0000 mg | ORAL_TABLET | Freq: Every day | ORAL | 2 refills | Status: DC

## 2018-01-23 MED ORDER — LEVOTHYROXINE SODIUM 88 MCG PO TABS: 88.0000 ug | ORAL_TABLET | Freq: Every day | ORAL | 0 refills | Status: DC

## 2018-01-23 MED ORDER — INSULIN LISPRO 100 UNIT/ML (KWIKPEN): 10.0000 [IU] | PEN_INJECTOR | Freq: Every day | SUBCUTANEOUS | 11 refills | Status: DC

## 2018-01-23 MED ORDER — AMLODIPINE BESYLATE 10 MG PO TABS: 10.0000 mg | ORAL_TABLET | Freq: Every day | ORAL | 0 refills | Status: DC

## 2018-01-23 MED ORDER — HYDROCODONE-ACETAMINOPHEN 5-325 MG PO TABS: 1.0000 | ORAL_TABLET | Freq: Three times a day (TID) | ORAL | 0 refills | Status: DC | PRN

## 2018-01-23 NOTE — Telephone Encounter (Signed)
This encounter was created in error - please disregard.

## 2018-01-23 NOTE — Telephone Encounter (Signed)
Change to Humalog quick pen with pen needles.  Sent the pharmacy.

## 2018-01-23 NOTE — Addendum Note (Signed)
Addended by: Marisa Cyphers C on: 01/23/2018 12:35 PM   Modules accepted: Level of Service, SmartSet

## 2018-01-23 NOTE — Telephone Encounter (Signed)
RX Fax for [facility]@ [phone]  

## 2018-01-23 NOTE — Telephone Encounter (Signed)
Novolog called for patient  Non preferred with insurance   Can you change to Humalog and patient also needs insulin syringes ordered

## 2018-01-28 ENCOUNTER — Encounter: Payer: Self-pay | Admitting: Physician Assistant

## 2018-01-28 ENCOUNTER — Ambulatory Visit (INDEPENDENT_AMBULATORY_CARE_PROVIDER_SITE_OTHER): Payer: Medicare Other | Admitting: Physician Assistant

## 2018-01-28 VITALS — BP 111/67 | HR 88 | Temp 98.4°F | Ht 64.0 in | Wt 183.2 lb

## 2018-01-28 DIAGNOSIS — E1065 Type 1 diabetes mellitus with hyperglycemia: Secondary | ICD-10-CM | POA: Diagnosis not present

## 2018-01-28 DIAGNOSIS — E039 Hypothyroidism, unspecified: Secondary | ICD-10-CM

## 2018-01-28 MED ORDER — LEVOTHYROXINE SODIUM 100 MCG PO TABS: 100.0000 ug | ORAL_TABLET | Freq: Every day | ORAL | 0 refills | Status: DC

## 2018-01-28 MED ORDER — METFORMIN HCL 500 MG PO TABS: 500.0000 mg | ORAL_TABLET | Freq: Two times a day (BID) | ORAL | 3 refills | Status: DC

## 2018-01-28 NOTE — Progress Notes (Signed)
BP 111/67   Pulse 88   Temp 98.4 F (36.9 C) (Oral)   Ht 5\' 4"  (1.626 m)   Wt 183 lb 3.2 oz (83.1 kg)   BMI 31.45 kg/m    Subjective:    Patient ID: [name], female    DOB: [birth year], 67 y.o.   MRN: [id number]  HPI: [name] is a 67 y.o. female presenting on 01/28/2018 for Hospitalization Follow-up (Was in [facility] )  This patient comes in for a nursing home follow-up.  She was admitted after having had a hospitalization for an infection and dehydration.  Over the time that she has been treated her diabetes has gotten very out of control.  The last one we had checked was 6.0.  However the when they checked earlier this month was 11.8.  She has been started on Lantus 25 units 1 daily and Humalog 10 units when her sugar is over 300.  She has brought in her log.  She is consistently saying between 250 and 350.  She has not had any lows with her sugar.  She states that no metformin has been tried.  She has not titrated her Lantus whatsoever.  Also her thyroid was slightly off.  She needs an adjustment upward in her levothyroxine.  She reports that in general she is feeling better and getting more energy.  She is quite tired and feels shaky and weak at times.  Past Medical History:  Diagnosis Date  . Allergic rhinitis   . Allergy   . Anemia   . Ankle swelling   . Anxiety    takes Xanax daily   . Arthritis   . Asthma   . Blurred vision, bilateral   . Chronic kidney disease   . Depression    takes Celexa daily  . Diabetes mellitus without complication ([place] [place])    type II; takes Actos daily  . GERD (gastroesophageal reflux disease)   . Gout   . Heart murmur   . Heart palpitations   . Hemorrhoids   . History of blood transfusion    no abnormal reaction noted  . History of bronchitis   . Hyperlipidemia   . Hypertension    takes Catapres and Quinapril daily  . Hypothyroidism    takes Synthroid daily  . Moderate mitral insufficiency    a. 12/2014 Echo: EF  55-60%, mod MR, mildly to moderately dil LA, PASP 38mmHg.  . Osteoarthritis   . Paroxysmal supraventricular tachycardia ([place])   . Ringing in ears   . Shortness of breath dyspnea    ALbuterol daily as needed  . Stevens-Johnson disease ([place])   . Urinary incontinence    Relevant past medical, surgical, family and social history reviewed and updated as indicated. Interim medical history since our last visit reviewed. Allergies and medications reviewed and updated. DATA REVIEWED: CHART IN EPIC  Family History reviewed for pertinent findings.  Review of Systems  Constitutional: Negative.  Negative for activity change, fatigue and fever.  HENT: Negative.   Eyes: Negative.   Respiratory: Negative.  Negative for cough.   Cardiovascular: Negative.  Negative for chest pain.  Gastrointestinal: Negative.  Negative for abdominal pain.  Endocrine: Negative.   Genitourinary: Negative.  Negative for dysuria.  Musculoskeletal: Positive for arthralgias, joint swelling and myalgias.  Skin: Negative.   Neurological: Positive for weakness. Negative for dizziness, facial asymmetry and numbness.    Allergies as of 01/28/2018      Reactions  Other Itching, Nausea Only, Other (See Comments)   Acidic foods cause stomach upset and itching   Allopurinol Rash   Penicillins Rash   Name brand   Sulfa Antibiotics Rash      Medication List        Accurate as of 01/28/18  2:00 PM. Always use your most recent med list.          acetaminophen 325 MG tablet Commonly known as:  TYLENOL Take 650 mg by mouth every 4 (four) hours as needed.   albuterol 108 (90 Base) MCG/ACT inhaler Commonly known as:  PROVENTIL HFA;VENTOLIN HFA Inhale 1 puff into the lungs daily.   amLODipine 10 MG tablet Commonly known as:  NORVASC Take 1 tablet (10 mg total) by mouth daily.   BD INSULIN SYRINGE U/F 31G X 5/16" 0.3 ML Misc Generic drug:  Insulin Syringe-Needle U-100 USE TO INJECT LANTUS AND NOVOLOG DAILY     ergocalciferol 50000 units capsule Commonly known as:  VITAMIN D2 Give 1 tablet by mouth on Tues. and Fri. once a day   fluticasone 50 MCG/ACT nasal spray Commonly known as:  FLONASE Place 1 spray into both nostrils 2 (two) times daily as needed for allergies or rhinitis.   furosemide 40 MG tablet Commonly known as:  LASIX Take 40 mg by mouth daily.   glucose blood test strip Test blood sugars twice a day   HYDROcodone-acetaminophen 5-325 MG tablet Commonly known as:  NORCO/VICODIN Take 1 tablet by mouth every 8 (eight) hours as needed for moderate pain.   insulin glargine 100 UNIT/ML injection Commonly known as:  LANTUS Inject 0.25 mLs (25 Units total) into the skin at bedtime.   insulin lispro 100 UNIT/ML KiwkPen Commonly known as:  HUMALOG Inject 0.1 mLs (10 Units total) into the skin daily.   levothyroxine 100 MCG tablet Commonly known as:  SYNTHROID, LEVOTHROID Take 1 tablet (100 mcg total) by mouth daily before breakfast.   lip balm ointment Apply 1 application topically 2 (two) times daily as needed for lip care.   Magnesium 400 MG Tabs Take 400 mg by mouth daily.   Melatonin 3 MG Tabs Take 3 mg by mouth at bedtime as needed.   metFORMIN 500 MG tablet Commonly known as:  GLUCOPHAGE Take 1 tablet (500 mg total) by mouth 2 (two) times daily with a meal.   metoprolol tartrate 100 MG tablet Commonly known as:  LOPRESSOR Take 100 mg by mouth 2 (two) times daily.   montelukast 10 MG tablet Commonly known as:  SINGULAIR TAKE 1 TABLET BY MOUTH ONCE DAILY IN THE EVENING   omeprazole 40 MG capsule Commonly known as:  PRILOSEC Take 40 mg by mouth daily.   Pen Needles 32G X 6 MM Misc Inject 1 each into the skin daily. Use to inject insulin subcu daily.  Dx: E11.9   potassium chloride SA 20 MEQ tablet Commonly known as:  K-DUR,KLOR-CON Take 40 mEq by mouth daily.   quinapril 20 MG tablet Commonly known as:  ACCUPRIL TAKE 1 TABLET BY MOUTH EVERY DAY. (HOLD  IF SYSTOLIC BLOOD PRESSURE (TOP NUMBER) IS LESS THAN 110)   simvastatin 40 MG tablet Commonly known as:  ZOCOR Take 0.5 tablets (20 mg total) by mouth daily.   umeclidinium-vilanterol 62.5-25 MCG/INH Aepb Commonly known as:  ANORO ELLIPTA Inhale 1 puff into the lungs daily.          Objective:    BP 111/67   Pulse 88   Temp 98.4 F (  36.9 C) (Oral)   Ht 5\' 4"  (1.626 m)   Wt 183 lb 3.2 oz (83.1 kg)   BMI 31.45 kg/m   Allergies  Allergen Reactions  . Other Itching, Nausea Only and Other (See Comments)    Acidic foods cause stomach upset and itching  . Allopurinol Rash  . Penicillins Rash    Name brand  . Sulfa Antibiotics Rash    Wt Readings from Last 3 Encounters:  01/28/18 183 lb 3.2 oz (83.1 kg)  11/23/17 194 lb 12.8 oz (88.4 kg)  05/30/17 193 lb 3.2 oz (87.6 kg)    Physical Exam  Constitutional: She is oriented to person, place, and time. She appears well-developed and well-nourished.  HENT:  Head: Normocephalic and atraumatic.  Eyes: Pupils are equal, round, and reactive to light. Conjunctivae and EOM are normal.  Cardiovascular: Normal rate, regular rhythm, normal heart sounds and intact distal pulses.  Pulmonary/Chest: Effort normal and breath sounds normal.  Abdominal: Soft. Bowel sounds are normal.  Neurological: She is alert and oriented to person, place, and time. She has normal reflexes.  Skin: Skin is warm and dry. No rash noted.  Psychiatric: She has a normal mood and affect. Her behavior is normal. Judgment and thought content normal.    Results for orders placed or performed during the hospital encounter of 01/22/18  CBC  Result Value Ref Range   WBC 6.6 4.0 - 10.5 K/uL   RBC 4.28 3.87 - 5.11 MIL/uL   Hemoglobin 11.2 (L) 12.0 - 15.0 g/dL   HCT 35.5 (L) 36.0 - 46.0 %   MCV 82.9 78.0 - 100.0 fL   MCH 26.2 26.0 - 34.0 pg   MCHC 31.5 30.0 - 36.0 g/dL   RDW 17.1 (H) 11.5 - 15.5 %   Platelets 237 150 - 400 K/uL  Basic metabolic panel  Result  Value Ref Range   Sodium 138 135 - 145 mmol/L   Potassium 4.8 3.5 - 5.1 mmol/L   Chloride 105 98 - 111 mmol/L   CO2 26 22 - 32 mmol/L   Glucose, Bld 254 (H) 70 - 99 mg/dL   BUN 16 8 - 23 mg/dL   Creatinine, Ser 1.21 (H) 0.44 - 1.00 mg/dL   Calcium 9.5 8.9 - 10.3 mg/dL   GFR calc non Af Amer 46 (L) >60 mL/min   GFR calc Af Amer 53 (L) >60 mL/min   Anion gap 7 5 - 15  Folate  Result Value Ref Range   Folate 17.2 >5.9 ng/mL  Iron and TIBC  Result Value Ref Range   Iron 73 28 - 170 ug/dL   TIBC 382 250 - 450 ug/dL   Saturation Ratios 19 10.4 - 31.8 %   UIBC 309 ug/dL  Ferritin  Result Value Ref Range   Ferritin 135 11 - 307 ng/mL  Vitamin B12  Result Value Ref Range   Vitamin B-12 450 180 - 914 pg/mL      Assessment & Plan:   1. Uncontrolled type 1 diabetes mellitus with hyperglycemia (HCC) Increase Lantus by 1 unit daily until fasting blood sugars are under 150. Start metformin 500 mg 1 twice daily, call if any GI side effects are too severe Check A1c in 8 weeks at her next recheck  2. Hypothyroidism, unspecified type Adjust levothyroxine to 100 micro grams 1 daily Recheck in 8 weeks at next appointment   Continue all other maintenance medications as listed above.  Follow up plan: Return in about 8 weeks (around  03/25/2018) for recheck and labs.  Educational handout given for [place] PA-C [facility] [address]  [place], [place] [address] [phone]   01/28/2018, 2:00 PM

## 2018-01-28 NOTE — Patient Instructions (Signed)
Increase Lantus by one unit daily until the fasting sugar is under 150. Stay on that dose.  Adding metformin

## 2018-01-30 ENCOUNTER — Other Ambulatory Visit: Payer: Self-pay | Admitting: Physician Assistant

## 2018-01-30 NOTE — Telephone Encounter (Signed)
Patient states that she has been taking an extra unit of insulin daily until her BS is under control. She states that she is almost up to 30 units daily and her syringe only goes to 30U. I advised that all insulin syringes are 100 units and she states that hers are not like this. I attempted to call [facility] and was on hold for 10+ minutes to clarify what syringes she is using. Will try to contact pharmacy again.

## 2018-02-01 ENCOUNTER — Telehealth: Payer: Self-pay | Admitting: Physician Assistant

## 2018-02-01 MED ORDER — "INSULIN SYRINGE-NEEDLE U-100 31G X 5/16"" 0.5 ML MISC": 11 refills | Status: DC

## 2018-02-01 NOTE — Telephone Encounter (Signed)
Spoke with pt, Some of her readings include:  128/70 98/79 108/68 107/68 130/86 133/86 104/74 102/66 101/63 110/70  She takes metoprolol 100 BID and amlodipine 10 QD regularly. She also has a quinapril 20 mg daily IF BP is over 825 systolic.  She is dropping mid day and is feeling weak and nauseated.  I mentioned that maybe she could try 1/2 tab of that IF bp was over 110 over the weekend and she will watch BP and call Monday . I did confirm with Pharm D and tab can be cut.

## 2018-02-04 NOTE — Telephone Encounter (Signed)
Pt notified to d/c Amlodipine Verbalizes understanding Since dced from nursing home , meds were changed appt scheduled to reevaluate meds

## 2018-02-04 NOTE — Telephone Encounter (Signed)
That is okay, and could stop the norvasc altogether, continue on the other meds for various medical benefits. Heart and kidney protection from metoprolol and benazepril.

## 2018-02-07 ENCOUNTER — Ambulatory Visit: Payer: Medicare Other | Admitting: Physician Assistant

## 2018-02-13 ENCOUNTER — Telehealth: Payer: Self-pay | Admitting: Physician Assistant

## 2018-02-13 ENCOUNTER — Other Ambulatory Visit: Payer: Self-pay | Admitting: Physician Assistant

## 2018-02-13 NOTE — Telephone Encounter (Signed)
What is the name of the medication? Lantus vial and not the pen. Patient is out  Have you contacted your pharmacy to request a refill? yes  Which pharmacy would you like this sent to? [facility] [place]   Patient notified that their request is being sent to the clinical staff for review and that they should receive a call once it is complete. If they do not receive a call within 24 hours they can check with their pharmacy or our office.

## 2018-02-14 ENCOUNTER — Other Ambulatory Visit: Payer: Self-pay | Admitting: Physician Assistant

## 2018-02-14 NOTE — Telephone Encounter (Signed)
Pt aware refill sent to pharmacy 

## 2018-02-14 NOTE — Telephone Encounter (Signed)
Patient wants a call when done.

## 2018-02-15 ENCOUNTER — Other Ambulatory Visit: Payer: Self-pay | Admitting: Physician Assistant

## 2018-02-15 MED ORDER — INSULIN GLARGINE 100 UNIT/ML ~~LOC~~ SOLN: 25.0000 [IU] | Freq: Every day | SUBCUTANEOUS | 11 refills | Status: DC

## 2018-02-15 MED ORDER — INSULIN LISPRO 100 UNIT/ML (KWIKPEN): 10.0000 [IU] | PEN_INJECTOR | Freq: Every day | SUBCUTANEOUS | 11 refills | Status: DC

## 2018-02-15 NOTE — Telephone Encounter (Signed)
Patient aware.

## 2018-02-15 NOTE — Telephone Encounter (Signed)
I think some duplications have occurred in this.  I have sent it this morning.  Could you please look at her note and get rid of any duplicates that are they are?

## 2018-02-22 ENCOUNTER — Other Ambulatory Visit: Payer: Self-pay | Admitting: Physician Assistant

## 2018-02-27 ENCOUNTER — Encounter: Payer: Self-pay | Admitting: Physician Assistant

## 2018-02-27 ENCOUNTER — Ambulatory Visit (INDEPENDENT_AMBULATORY_CARE_PROVIDER_SITE_OTHER): Payer: Medicare Other | Admitting: Physician Assistant

## 2018-02-27 VITALS — BP 89/52 | HR 80 | Ht 64.0 in | Wt 182.2 lb

## 2018-02-27 DIAGNOSIS — N189 Chronic kidney disease, unspecified: Secondary | ICD-10-CM

## 2018-02-27 DIAGNOSIS — K439 Ventral hernia without obstruction or gangrene: Secondary | ICD-10-CM | POA: Diagnosis not present

## 2018-02-27 DIAGNOSIS — E1065 Type 1 diabetes mellitus with hyperglycemia: Secondary | ICD-10-CM | POA: Diagnosis not present

## 2018-02-27 DIAGNOSIS — E039 Hypothyroidism, unspecified: Secondary | ICD-10-CM

## 2018-02-27 MED ORDER — QUINAPRIL HCL 5 MG PO TABS: 10.0000 mg | ORAL_TABLET | Freq: Every day | ORAL | 3 refills | Status: DC

## 2018-02-27 MED ORDER — OMEPRAZOLE 40 MG PO CPDR: 40.0000 mg | DELAYED_RELEASE_CAPSULE | Freq: Every day | ORAL | 3 refills | Status: DC

## 2018-02-27 MED ORDER — CLONIDINE HCL 0.1 MG PO TABS: 0.1000 mg | ORAL_TABLET | Freq: Every day | ORAL | 1 refills | Status: DC

## 2018-03-04 NOTE — Progress Notes (Signed)
BP (!) 89/52   Pulse 80   Ht 5\' 4"  (1.626 m)   Wt 182 lb 3.2 oz (82.6 kg)   BMI 31.27 kg/m    Subjective:    Patient ID: [name], female    DOB: 25-Mar-1951, 67 y.o.   MRN: [id number]  HPI: [name] is a 67 y.o. female presenting on 02/27/2018 for Diabetes (6 month follow ) and Hypertension This patient comes in for recheck on her chronic medical conditions.  They do include hypertension, diabetes, hypothyroidism, asthma and allergies, osteoarthritis.  All of her medications have been thoroughly reviewed.  She has made a few changes in her blood pressure routine.  At this time it seems to be working a little bit too well we will make a little bit of adjustment for her.  She does have follow-ups with her other specialist. Labs will be performed to assure stability.  She reports that she is starting to feel a little bit better at home and having more energy.  She was in the hospital for quite some time and then in rehab.  She was able to make it here today on her own.  She does report her ventral hernia being a little bit worse.  It had surgery many years ago.  She states that there is a little bit pressure in her upper abdomen.  She denies any changes in her bowel movements.  She denies any severe abdominal pain.  We have discussed avoiding any heavy lifting and she is ready for a general surgery consult.  Past Medical History:  Diagnosis Date  . Allergic rhinitis   . Allergy   . Anemia   . Ankle swelling   . Anxiety    takes Xanax daily   . Arthritis   . Asthma   . Blurred vision, bilateral   . Chronic kidney disease   . Depression    takes Celexa daily  . Diabetes mellitus without complication ([place])    type II; takes Actos daily  . GERD (gastroesophageal reflux disease)   . Gout   . Heart murmur   . Heart palpitations   . Hemorrhoids   . History of blood transfusion    no abnormal reaction noted  . History of bronchitis   . Hyperlipidemia   . Hypertension      takes Catapres and Quinapril daily  . Hypothyroidism    takes Synthroid daily  . Moderate mitral insufficiency    a. 12/2014 Echo: EF 55-60%, mod MR, mildly to moderately dil LA, PASP 75mmHg.  . Osteoarthritis   . Paroxysmal supraventricular tachycardia ([place])   . Ringing in ears   . Shortness of breath dyspnea    ALbuterol daily as needed  . Stevens-Johnson disease ([place])   . Urinary incontinence    Relevant past medical, surgical, family and social history reviewed and updated as indicated. Interim medical history since our last visit reviewed. Allergies and medications reviewed and updated. DATA REVIEWED: CHART IN EPIC  Family History reviewed for pertinent findings.  Review of Systems  Constitutional: Negative.  Negative for activity change, fatigue and fever.  HENT: Negative.   Eyes: Negative.   Respiratory: Negative.  Negative for cough.   Cardiovascular: Negative.  Negative for chest pain.  Gastrointestinal: Positive for abdominal distention and abdominal pain. Negative for constipation, diarrhea and nausea.  Endocrine: Negative.   Genitourinary: Negative.  Negative for dysuria.  Musculoskeletal: Positive for arthralgias and joint swelling.  Skin: Negative.  Neurological: Negative.     Allergies as of 02/27/2018      Reactions   Other Itching, Nausea Only, Other (See Comments)   Acidic foods cause stomach upset and itching   Allopurinol Rash   Penicillins Rash   Name brand   Sulfa Antibiotics Rash      Medication List        Accurate as of 02/27/18 11:59 PM. Always use your most recent med list.          acetaminophen 325 MG tablet Commonly known as:  TYLENOL Take 650 mg by mouth every 4 (four) hours as needed.   albuterol 108 (90 Base) MCG/ACT inhaler Commonly known as:  PROVENTIL HFA;VENTOLIN HFA Inhale 1 puff into the lungs daily.   atorvastatin 20 MG tablet Commonly known as:  LIPITOR Take 20 mg by mouth daily.   citalopram 20 MG  tablet Commonly known as:  CELEXA Take 20 mg by mouth daily.   cloNIDine 0.1 MG tablet Commonly known as:  CATAPRES Take 1 tablet (0.1 mg total) by mouth daily.   ergocalciferol 50000 units capsule Commonly known as:  VITAMIN D2 Give 1 tablet by mouth on Tues. and Fri. once a day   fluticasone 50 MCG/ACT nasal spray Commonly known as:  FLONASE Place 1 spray into both nostrils 2 (two) times daily as needed for allergies or rhinitis.   furosemide 40 MG tablet Commonly known as:  LASIX Take 40 mg by mouth daily.   glucose blood test strip Test blood sugars twice a day   hydrOXYzine 10 MG tablet Commonly known as:  ATARAX/VISTARIL Take 5 mg by mouth 3 (three) times daily as needed.   insulin glargine 100 UNIT/ML injection Commonly known as:  LANTUS Inject 0.25-0.3 mLs (25-30 Units total) into the skin daily.   insulin lispro 100 UNIT/ML injection Commonly known as:  HUMALOG Inject 10 Units into the skin. Only if BS is over 400   Insulin Syringe-Needle U-100 31G X 5/16" 0.5 ML Misc USE TO INJECT LANTUS AND NOVOLOG DAILY   levothyroxine 75 MCG tablet Commonly known as:  SYNTHROID, LEVOTHROID TAKE 1 TABLET BY MOUTH EVERY DAY   lip balm ointment Apply 1 application topically 2 (two) times daily as needed for lip care.   Magnesium 400 MG Tabs Take 400 mg by mouth daily.   magnesium hydroxide 311 MG Chew chewable tablet Commonly known as:  PHILLIPS CHEWS Chew 311 mg by mouth every 4 (four) hours as needed.   metFORMIN 500 MG tablet Commonly known as:  GLUCOPHAGE Take 1 tablet (500 mg total) by mouth 2 (two) times daily with a meal.   metoprolol tartrate 25 MG tablet Commonly known as:  LOPRESSOR Take 25 mg by mouth 2 (two) times daily.   montelukast 10 MG tablet Commonly known as:  SINGULAIR TAKE 1 TABLET BY MOUTH ONCE DAILY IN THE EVENING   omeprazole 40 MG capsule Commonly known as:  PRILOSEC Take 1 capsule (40 mg total) by mouth daily.   potassium chloride  SA 20 MEQ tablet Commonly known as:  K-DUR,KLOR-CON Take 40 mEq by mouth daily.   quinapril 5 MG tablet Commonly known as:  ACCUPRIL Take 2 tablets (10 mg total) by mouth daily.   umeclidinium-vilanterol 62.5-25 MCG/INH Aepb Commonly known as:  ANORO ELLIPTA Inhale 1 puff into the lungs daily.          Objective:    BP (!) 89/52   Pulse 80   Ht 5\' 4"  (1.626 m)  Wt 182 lb 3.2 oz (82.6 kg)   BMI 31.27 kg/m   Allergies  Allergen Reactions  . Other Itching, Nausea Only and Other (See Comments)    Acidic foods cause stomach upset and itching  . Allopurinol Rash  . Penicillins Rash    Name brand  . Sulfa Antibiotics Rash    Wt Readings from Last 3 Encounters:  02/27/18 182 lb 3.2 oz (82.6 kg)  01/28/18 183 lb 3.2 oz (83.1 kg)  11/23/17 194 lb 12.8 oz (88.4 kg)    Physical Exam  Constitutional: She is oriented to person, place, and time. She appears well-developed and well-nourished.  HENT:  Head: Normocephalic and atraumatic.  Eyes: Pupils are equal, round, and reactive to light. Conjunctivae and EOM are normal.  Cardiovascular: Normal rate, regular rhythm, normal heart sounds and intact distal pulses.  Pulmonary/Chest: Effort normal and breath sounds normal.  Abdominal: Soft. Bowel sounds are normal. A hernia is present. Hernia confirmed positive in the ventral area.    Neurological: She is alert and oriented to person, place, and time. She has normal reflexes.  Skin: Skin is warm and dry. No rash noted.  Psychiatric: She has a normal mood and affect. Her behavior is normal. Judgment and thought content normal.    Results for orders placed or performed during the hospital encounter of 01/22/18  CBC  Result Value Ref Range   WBC 6.6 4.0 - 10.5 K/uL   RBC 4.28 3.87 - 5.11 MIL/uL   Hemoglobin 11.2 (L) 12.0 - 15.0 g/dL   HCT 35.5 (L) 36.0 - 46.0 %   MCV 82.9 78.0 - 100.0 fL   MCH 26.2 26.0 - 34.0 pg   MCHC 31.5 30.0 - 36.0 g/dL   RDW 17.1 (H) 11.5 - 15.5 %    Platelets 237 150 - 400 K/uL  Basic metabolic panel  Result Value Ref Range   Sodium 138 135 - 145 mmol/L   Potassium 4.8 3.5 - 5.1 mmol/L   Chloride 105 98 - 111 mmol/L   CO2 26 22 - 32 mmol/L   Glucose, Bld 254 (H) 70 - 99 mg/dL   BUN 16 8 - 23 mg/dL   Creatinine, Ser 1.21 (H) 0.44 - 1.00 mg/dL   Calcium 9.5 8.9 - 10.3 mg/dL   GFR calc non Af Amer 46 (L) >60 mL/min   GFR calc Af Amer 53 (L) >60 mL/min   Anion gap 7 5 - 15  Folate  Result Value Ref Range   Folate 17.2 >5.9 ng/mL  Iron and TIBC  Result Value Ref Range   Iron 73 28 - 170 ug/dL   TIBC 382 250 - 450 ug/dL   Saturation Ratios 19 10.4 - 31.8 %   UIBC 309 ug/dL  Ferritin  Result Value Ref Range   Ferritin 135 11 - 307 ng/mL  Vitamin B12  Result Value Ref Range   Vitamin B-12 450 180 - 914 pg/mL      Assessment & Plan:   1. Uncontrolled type 1 diabetes mellitus with hyperglycemia (HCC) Continue Lantus, Humalog, metformin Continue diet  2. Hypothyroidism, unspecified type Continue medication and plan recheck  3. Chronic kidney disease, unspecified CKD stage Continue medication and plan to recheck  4. Ventral hernia without obstruction or gangrene Plan referral for general surgery   Continue all other maintenance medications as listed above.  Follow up plan: Return for keep appointment.  Educational handout given for [place] PA-[facility] [place]  [address]  [place], [place] [address] [phone]   03/04/2018, 12:43 PM

## 2018-03-18 ENCOUNTER — Other Ambulatory Visit: Payer: Self-pay | Admitting: Surgery

## 2018-03-18 DIAGNOSIS — K432 Incisional hernia without obstruction or gangrene: Secondary | ICD-10-CM | POA: Diagnosis not present

## 2018-03-18 DIAGNOSIS — K439 Ventral hernia without obstruction or gangrene: Secondary | ICD-10-CM

## 2018-03-26 ENCOUNTER — Ambulatory Visit
Admission: RE | Admit: 2018-03-26 | Discharge: 2018-03-26 | Disposition: A | Discharge status: Home / Self Care | Payer: Medicare Other | Source: Ambulatory Visit | Attending: Surgery | Admitting: Surgery

## 2018-03-26 DIAGNOSIS — K439 Ventral hernia without obstruction or gangrene: Secondary | ICD-10-CM | POA: Diagnosis not present

## 2018-03-26 MED ORDER — IOPAMIDOL (ISOVUE-300) INJECTION 61%
100.0000 mL | Freq: Once | INTRAVENOUS | Status: AC | PRN
  Administered 2018-03-26: 100 mL via INTRAVENOUS

## 2018-03-27 ENCOUNTER — Ambulatory Visit: Payer: Medicare Other | Admitting: Physician Assistant

## 2018-03-28 ENCOUNTER — Telehealth: Payer: Self-pay | Admitting: *Deleted

## 2018-03-28 DIAGNOSIS — E039 Hypothyroidism, unspecified: Secondary | ICD-10-CM

## 2018-03-28 MED ORDER — LEVOTHYROXINE SODIUM 100 MCG PO TABS: 100.0000 ug | ORAL_TABLET | Freq: Every day | ORAL | 0 refills | Status: DC

## 2018-03-28 MED ORDER — METOPROLOL SUCCINATE ER 25 MG PO TB24: 25.0000 mg | ORAL_TABLET | Freq: Every day | ORAL | 1 refills | Status: DC

## 2018-03-28 MED ORDER — METOPROLOL TARTRATE 25 MG PO TABS: 25.0000 mg | ORAL_TABLET | Freq: Every day | ORAL | 0 refills | Status: DC

## 2018-03-28 NOTE — Telephone Encounter (Signed)
Spoke with patient and she confirmed that her medication bottle at home is for toprol xl 25 mg by mouth daily. Patient confirmed that she is not taking lopressor 25 mg. No rx sent for toprol xl 25 mg d/t patient stating she did not need a refill on this medication at this time but she wanted to get her medication profile corrected for her upcoming surgery. Medication profile updated per patient request.

## 2018-03-28 NOTE — Telephone Encounter (Signed)
Patient returning call.

## 2018-03-28 NOTE — Telephone Encounter (Signed)
Received refill request for toprol xl 25 mg daily from [facility]. Contacted patient to confirm what form, dose and directions of metoprolol she is taking. [place].

## 2018-03-28 NOTE — Telephone Encounter (Signed)
Pt came in requesting medications be updated in chart

## 2018-03-29 ENCOUNTER — Ambulatory Visit: Payer: Self-pay | Admitting: Surgery

## 2018-03-29 DIAGNOSIS — K432 Incisional hernia without obstruction or gangrene: Secondary | ICD-10-CM | POA: Diagnosis not present

## 2018-03-29 NOTE — H&P (Signed)
History of Present Illness [name]. Angela Jian MD; 03/29/2018 1:58 PM) The patient is a 67 year old female who presents with an incisional hernia. Referred by Angela Nearing, PA for ventral incisional hernia  This is a 67 year old female with multiple medical issues including type 1 diabetes, hypothyroidism, hypertension, chronic kidney disease, irritable bowel syndrome who presents with a recurrent ventral hernia. The patient has a complex past surgical history as she is not able to remember all the details. Apparently in the 1970s she had a hysterectomy through a lower midline incision. In the 1980s, she apparently had a laparoscopic hiatal hernia repair and Nissen fundoplication by Dr. Anthony Bright in [employer]. During that time she also developed a bowel obstruction and had exploratory laparotomy with a segmental small bowel resection. Later she developed a ventral hernia in this area and had a laparoscopic ventral hernia repair with mesh. About one year ago, she began developing bulging and some mild discomfort in her epigastrium in her incision. She denies any obstructive symptoms. She is able to reduce the mass. This seems to continue to enlarge. She was recently hospitalized for mental status changes and some blood sugar issues. She seems to be improved from that hospitalization. She would like to now have consideration for hernia repair. I evaluated her recently and recommended at CT scan of the abdomen and pelvis.  CLINICAL DATA: Ventral hernia  EXAM: CT ABDOMEN AND PELVIS WITH CONTRAST  TECHNIQUE: Multidetector CT imaging of the abdomen and pelvis was performed using the standard protocol following bolus administration of intravenous contrast.  CONTRAST: 151mL ISOVUE-300 IOPAMIDOL (ISOVUE-300) INJECTION 61%  Creatinine was obtained on site at [name] at [address]  Results: Creatinine 1.3 mg/dL.  COMPARISON: None.  FINDINGS: Lower chest:  Linear scarring in the lung bases. Heart is normal size. No effusions.  Hepatobiliary: No focal hepatic abnormality. Gallbladder unremarkable.  Pancreas: No focal abnormality or ductal dilatation.  Spleen: No focal abnormality. Normal size.  Adrenals/Urinary Tract: 10 mm low-density area in the midpole of the left kidney, likely cyst. No hydronephrosis. No adrenal mass. Urinary bladder obscured by beam hardening artifact from bilateral hip replacements.  Stomach/Bowel: Scattered left colonic diverticulosis. No active diverticulitis. No evidence of bowel obstruction.  Vascular/Lymphatic: Aortic atherosclerosis. No enlarged abdominal or pelvic lymph nodes.  Reproductive: Lower pelvic structures are obscured by beam hardening artifact. No visible adnexal mass.  Other: There is evidence of prior ventral hernia repair with recurrence at the repair site. This contains a portion of the transverse colon and small bowel loop. No obstruction. Just superior to the prior ventral hernia repair is a 2nd midline supraumbilical ventral hernia containing fat. This hernia measures up to 7.3 cm in greatest diameter.  Musculoskeletal: Prior bilateral hip replacements. No acute bony abnormality. Advanced degenerative changes and fusion across the L4-5 level.  IMPRESSION: Recurrent midline ventral hernia containing a portion of the mid transverse colon and a small bowel loop. No evidence of bowel obstruction.  There is also a 2nd midline ventral hernia just superior to the previously repaired ventral hernia which contains fat.  Aortic atherosclerosis.   Electronically Signed By: [name] M.D. On: 03/26/2018 08:48   Problem List/Past Medical Angela Key K. Devun Anna, MD; 03/29/2018 1:58 PM) VENTRAL INCISIONAL HERNIA WITHOUT OBSTRUCTION OR GANGRENE (K43.2)  Past Surgical History (Angela Beber K. Anthonny Schiller, MD; 03/29/2018 1:58 PM) Esophagomyotomy Hemorrhoidectomy Hip Surgery  Bilateral. Hysterectomy (not due to cancer) - Partial Resection of Small Bowel  Diagnostic Studies History Angela Key K. Alexie Lanni, MD; 03/29/2018 1:58  PM) Colonoscopy >10 years ago  Allergies Angela Lorenzo, [name]; 84/13/2440 1:02 AM) Allopurinol *GOUT AGENTS* Penicillin G Benzathine & Proc *PENICILLINS* Sulfa-Mag *MINERALS & ELECTROLYTES* Sulfa 10 *OPHTHALMIC AGENTS*  Medication History Angela Lorenzo, [name]; 72/53/6644 0:34 AM) Metoprolol Succinate ER (25MG  Tablet ER 24HR, Oral) Active. Tylenol (325MG  Tablet, Oral) Active. Albuterol Sulfate HFA (108 (90 Base)MCG/ACT Aerosol Soln, Inhalation) Active. Lipitor (20MG  Tablet, Oral) Active. CeleXA (20MG  Tablet, Oral) Active. cloNIDine (0.1MG /24HR Patch Weekly, Transdermal) Active. cloNIDine HCl (0.1MG  Tablet, Oral) Active. Vitamin D2 (2000UNIT Tablet, Oral) Active. Flonase Allergy Relief (50MCG/ACT Suspension, Nasal) Active. Lasix (40MG  Tablet, Oral) Active. hydrOXYzine HCl (10MG  Tablet, Oral) Active. Lantus (100UNIT/ML Solution, Subcutaneous) Active. HumaLOG KwikPen (100UNIT/ML Soln Pen-inj, Subcutaneous) Active. Synthroid (100MCG Tablet, Oral) Active. Magnesium Oxide (400MG  Tablet, Oral) Active. metFORMIN HCl (500MG  Tablet, Oral) Active. Omeprazole (40MG  Capsule DR, Oral) Active. Potassium Chloride (20MEQ Packet, Oral) Active. Accupril (5MG  Tablet, Oral) Active. Anoro Ellipta (62.5-25MCG/INH Aero Pow Br Act, Inhalation) Active. Medications Reconciled  Social History [name]. Angela Judice, MD; 03/29/2018 1:58 PM) No alcohol use No caffeine use No drug use Tobacco use Never smoker.  Family History [name]. Angela Maberry, MD; 03/29/2018 1:58 PM) Arthritis Brother, Mother. Diabetes Mellitus Brother, Mother. Hypertension Father, Mother. Ischemic Bowel Disease Brother, Mother. Thyroid problems Brother.  Pregnancy / Birth History [name]. Angela Rae, MD; 03/29/2018 1:58  PM) Age at menarche 72 years. Age of menopause <46 Gravida 0 Para 0  Other Problems [name]. Angela Tavenner, MD; 03/29/2018 1:58 PM) Anxiety Disorder Arthritis Asthma Diabetes Mellitus Gastroesophageal Reflux Disease Heart murmur High blood pressure Thyroid Disease Umbilical Hernia Repair    Vitals [employer] [name]; 74/25/9563 8:75 AM) 03/29/2018 9:06 AM Weight: 177.2 lb Height: 64in Body Surface Area: 1.86 m Body Mass Index: 30.42 kg/m  Temp.: 97.24F(Temporal)  Pulse: 91 (Regular)  BP: 122/80 (Sitting, Left Arm, Standard)      Physical Exam Angela Key K. Sorrel Cassetta MD; 03/29/2018 1:58 PM)  The physical exam findings are as follows: Note:WDWN in NAD Eyes: Pupils equal, round; sclera anicteric HENT: Oral mucosa moist; good dentition Neck: No masses palpated, no thyromegaly Lungs: CTA bilaterally; normal respiratory effort CV: Regular rate and rhythm; no murmurs; extremities well-perfused with no edema Abd: +bowel sounds, soft, non-tender, protruding upper midline bulge - reducible; multiple healed surgical scars Skin: Warm, dry; no sign of jaundice Psychiatric - alert and oriented x 4; calm mood and affect    Assessment & Plan Angela Key K. Dariana Garbett MD; 03/29/2018 1:59 PM)  VENTRAL INCISIONAL HERNIA WITHOUT OBSTRUCTION OR GANGRENE (K43.2)  Current Plans Schedule for Surgery - Open ventral hernia repair with mesh. The surgical procedure has been discussed with the patient. Potential risks, benefits, alternative treatments, and expected outcomes have been explained. All of the patient's questions at this time have been answered. The likelihood of reaching the patient's treatment goal is good. The patient understand the proposed surgical procedure and wishes to proceed. Note:The patient has two adjacent defects. One of these contains the old mesh as well as the mesh tacks.  We will plan open repair with  explanatation of the old mesh.  [name]. Georgette Dover, MD, [facility] Surgery  General/ Trauma Surgery [place] [phone]  03/29/2018 2:00 PM

## 2018-03-29 NOTE — H&P (View-Only) (Signed)
History of Present Illness [name]. Angela Gerdeman MD; 03/29/2018 1:58 PM) The patient is a 67 year old female who presents with an incisional hernia. Referred by Angela Nearing, PA for ventral incisional hernia  This is a 67 year old female with multiple medical issues including type 1 diabetes, hypothyroidism, hypertension, chronic kidney disease, irritable bowel syndrome who presents with a recurrent ventral hernia. The patient has a complex past surgical history as she is not able to remember all the details. Apparently in the 1970s she had a hysterectomy through a lower midline incision. In the 1980s, she apparently had a laparoscopic hiatal hernia repair and Nissen fundoplication by Dr. Anthony Sar in [place]. During that time she also developed a bowel obstruction and had exploratory laparotomy with a segmental small bowel resection. Later she developed a ventral hernia in this area and had a laparoscopic ventral hernia repair with mesh. About one year ago, she began developing bulging and some mild discomfort in her epigastrium in her incision. She denies any obstructive symptoms. She is able to reduce the mass. This seems to continue to enlarge. She was recently hospitalized for mental status changes and some blood sugar issues. She seems to be improved from that hospitalization. She would like to now have consideration for hernia repair. I evaluated her recently and recommended at CT scan of the abdomen and pelvis.  CLINICAL DATA: Ventral hernia  EXAM: CT ABDOMEN AND PELVIS WITH CONTRAST  TECHNIQUE: Multidetector CT imaging of the abdomen and pelvis was performed using the standard protocol following bolus administration of intravenous contrast.  CONTRAST: 158mL ISOVUE-300 IOPAMIDOL (ISOVUE-300) INJECTION 61%  Creatinine was obtained on site at [facility] at [address]  Results: Creatinine 1.3 mg/dL.  COMPARISON: None.  FINDINGS: Lower chest:  Linear scarring in the lung bases. Heart is normal size. No effusions.  Hepatobiliary: No focal hepatic abnormality. Gallbladder unremarkable.  Pancreas: No focal abnormality or ductal dilatation.  Spleen: No focal abnormality. Normal size.  Adrenals/Urinary Tract: 10 mm low-density area in the midpole of the left kidney, likely cyst. No hydronephrosis. No adrenal mass. Urinary bladder obscured by beam hardening artifact from bilateral hip replacements.  Stomach/Bowel: Scattered left colonic diverticulosis. No active diverticulitis. No evidence of bowel obstruction.  Vascular/Lymphatic: Aortic atherosclerosis. No enlarged abdominal or pelvic lymph nodes.  Reproductive: Lower pelvic structures are obscured by beam hardening artifact. No visible adnexal mass.  Other: There is evidence of prior ventral hernia repair with recurrence at the repair site. This contains a portion of the transverse colon and small bowel loop. No obstruction. Just superior to the prior ventral hernia repair is a 2nd midline supraumbilical ventral hernia containing fat. This hernia measures up to 7.3 cm in greatest diameter.  Musculoskeletal: Prior bilateral hip replacements. No acute bony abnormality. Advanced degenerative changes and fusion across the L4-5 level.  IMPRESSION: Recurrent midline ventral hernia containing a portion of the mid transverse colon and a small bowel loop. No evidence of bowel obstruction.  There is also a 2nd midline ventral hernia just superior to the previously repaired ventral hernia which contains fat.  Aortic atherosclerosis.   Electronically Signed By: Rolm Baptise M.D. On: 03/26/2018 08:48   Problem List/Past Medical Rodman Key K. Bowie Doiron, MD; 03/29/2018 1:58 PM) VENTRAL INCISIONAL HERNIA WITHOUT OBSTRUCTION OR GANGRENE (K43.2)  Past Surgical History (Roise Emert K. Jenavie Stanczak, MD; 03/29/2018 1:58 PM) Esophagomyotomy Hemorrhoidectomy Hip Surgery  Bilateral. Hysterectomy (not due to cancer) - Partial Resection of Small Bowel  Diagnostic Studies History Rodman Key K. Weslie Rasmus, MD; 03/29/2018 1:58  PM) Colonoscopy >10 years ago  Allergies [name], LPN; 28/76/8115 7:26 AM) Allopurinol *GOUT AGENTS* Penicillin G Benzathine & Proc *PENICILLINS* Sulfa-Mag *MINERALS & ELECTROLYTES* Sulfa 10 *OPHTHALMIC AGENTS*  Medication History [name], LPN; 20/35/5974 1:63 AM) Metoprolol Succinate ER (25MG  Tablet ER 24HR, Oral) Active. Tylenol (325MG  Tablet, Oral) Active. Albuterol Sulfate HFA (108 (90 Base)MCG/ACT Aerosol Soln, Inhalation) Active. Lipitor (20MG  Tablet, Oral) Active. CeleXA (20MG  Tablet, Oral) Active. cloNIDine (0.1MG /24HR Patch Weekly, Transdermal) Active. cloNIDine HCl (0.1MG  Tablet, Oral) Active. Vitamin D2 (2000UNIT Tablet, Oral) Active. Flonase Allergy Relief (50MCG/ACT Suspension, Nasal) Active. Lasix (40MG  Tablet, Oral) Active. hydrOXYzine HCl (10MG  Tablet, Oral) Active. Lantus (100UNIT/ML Solution, Subcutaneous) Active. HumaLOG KwikPen (100UNIT/ML Soln Pen-inj, Subcutaneous) Active. Synthroid (100MCG Tablet, Oral) Active. Magnesium Oxide (400MG  Tablet, Oral) Active. metFORMIN HCl (500MG  Tablet, Oral) Active. Omeprazole (40MG  Capsule DR, Oral) Active. Potassium Chloride (20MEQ Packet, Oral) Active. Accupril (5MG  Tablet, Oral) Active. Anoro Ellipta (62.5-25MCG/INH Aero Pow Br Act, Inhalation) Active. Medications Reconciled  Social History [name]. Nuri Branca, MD; 03/29/2018 1:58 PM) No alcohol use No caffeine use No drug use Tobacco use Never smoker.  Family History [name]. Devrin Monforte, MD; 03/29/2018 1:58 PM) Arthritis Brother, Mother. Diabetes Mellitus Brother, Mother. Hypertension Father, Mother. Ischemic Bowel Disease Brother, Mother. Thyroid problems Brother.  Pregnancy / Birth History [name]. Rumaysa Sabatino, MD; 03/29/2018 1:58  PM) Age at menarche 71 years. Age of menopause <46 Gravida 0 Para 0  Other Problems [name]. Teige Rountree, MD; 03/29/2018 1:58 PM) Anxiety Disorder Arthritis Asthma Diabetes Mellitus Gastroesophageal Reflux Disease Heart murmur High blood pressure Thyroid Disease Umbilical Hernia Repair    Vitals Claiborne Billings Dockery LPN; 84/53/6468 0:32 AM) 03/29/2018 9:06 AM Weight: 177.2 lb Height: 64in Body Surface Area: 1.86 m Body Mass Index: 30.42 kg/m  Temp.: 97.29F(Temporal)  Pulse: 91 (Regular)  BP: 122/80 (Sitting, Left Arm, Standard)      Physical Exam Rodman Key K. Jaelee Laughter MD; 03/29/2018 1:58 PM)  The physical exam findings are as follows: Note:WDWN in NAD Eyes: Pupils equal, round; sclera anicteric HENT: Oral mucosa moist; good dentition Neck: No masses palpated, no thyromegaly Lungs: CTA bilaterally; normal respiratory effort CV: Regular rate and rhythm; no murmurs; extremities well-perfused with no edema Abd: +bowel sounds, soft, non-tender, protruding upper midline bulge - reducible; multiple healed surgical scars Skin: Warm, dry; no sign of jaundice Psychiatric - alert and oriented x 4; calm mood and affect    Assessment & Plan Rodman Key K. Kathlyne Loud MD; 03/29/2018 1:59 PM)  VENTRAL INCISIONAL HERNIA WITHOUT OBSTRUCTION OR GANGRENE (K43.2)  Current Plans Schedule for Surgery - Open ventral hernia repair with mesh. The surgical procedure has been discussed with the patient. Potential risks, benefits, alternative treatments, and expected outcomes have been explained. All of the patient's questions at this time have been answered. The likelihood of reaching the patient's treatment goal is good. The patient understand the proposed surgical procedure and wishes to proceed. Note:The patient has two adjacent defects. One of these contains the old mesh as well as the mesh tacks.  We will plan open repair with  explanatation of the old mesh.  [name]. Georgette Dover, MD, [facility] Surgery  General/ Trauma Surgery Beeper [phone]  03/29/2018 2:00 PM

## 2018-04-01 ENCOUNTER — Encounter (HOSPITAL_COMMUNITY): Payer: Self-pay | Admitting: *Deleted

## 2018-04-01 ENCOUNTER — Other Ambulatory Visit: Payer: Self-pay

## 2018-04-01 ENCOUNTER — Other Ambulatory Visit: Payer: Self-pay | Admitting: *Deleted

## 2018-04-01 MED ORDER — METOPROLOL SUCCINATE ER 25 MG PO TB24: 25.0000 mg | ORAL_TABLET | Freq: Every day | ORAL | 1 refills | Status: DC

## 2018-04-01 NOTE — Progress Notes (Addendum)
Anesthesia Chart Review: SAME DAY WORKUP   Case:  [id number] Date/Time:  04/02/18 0815   Procedures:      OPEN VENTRAL HERNIA REPAIR WITH MESH (N/A )     INSERTION OF MESH (N/A )   Anesthesia type:  General   Pre-op diagnosis:  RECURRENT VNETRAL INCISIONAL HERNIA   Location:  [facility] OR ROOM 10 / [facility]   Surgeon:  Donnie Mesa, MD      DISCUSSION: 67 yo female never smoker. Pertinent hx includes HTN, DM2, CKD, moderate mitral insufficiency (by 01/2016 echo), anemia, hypothyroidism, GERD, anxiety, asthma, SOB, PSVT, HLD, tinnitus, Stevens-Johnson disease (not otherwise specified). S/P left THA 02/02/15, Right THA 02/01/2016.  Pt last saw Dr. Bronson Ing 05/11/2017. At that time he noted her moderatemitral and mild to moderate tricuspid regurgitation symptomatically stable and she was advised to followup in one year.   Review of recent labs shows very poor blood sugar control. Pt was admitted to [facility] 07/30-08/04/19 fordiabetic ketoacidosis. After discharge she was admitted briefly to SNF, discharged 01/21/2018.  Ref. Range 12/25/2014 13:51 02/02/2015 17:00 01/21/2016 09:07 02/01/2016 15:52 01/15/2018 03:15  Hemoglobin A1C Latest Ref Range: 4.8 - 5.6 % 6.0 (H) 6.0 (H) 6.4 (H) 6.0 (H) 11.8 (H)  Results for POLLYANNA, LEVAY (MRN [id number]) as of 04/01/2018 17:02  Ref. Range 01/30/2017 11:59 11/23/2017 10:51 01/12/2018 10:30 01/15/2018 03:15 01/22/2018 07:27  Glucose Latest Ref Range: 70 - 99 mg/dL 107 (H) 135 (H) 302 (H) 297 (H) 254 (H)   Of note, last EKG on 01/12/2017 showed Sinus  Rhythm .Low voltage in precordial leads. Old inferior infarct. Old anterior infarct. Nonspecific T-abnormality. Poor R wave progression and Q waves in aVF appear new compared to tracing from 02/03/2016.  Discussed case with Dr. Linna Caprice. Pt will need DOS eval by assigned anesthesiologist due to recent poor BG control. She will also need updated EKG DOS.   VS: There were no vitals taken for this visit.  PROVIDERS: Terald Sleeper,  PA-C   LABS: Pt will need DOS labs. Most recent labs displayed below: Results for CLARABELLE, OSCARSON (MRN [id number]) as of 04/01/2018 17:02  Ref. Range 01/22/2018 63:87  BASIC METABOLIC PANEL Unknown Rpt (A)  Sodium Latest Ref Range: 135 - 145 mmol/L 138  Potassium Latest Ref Range: 3.5 - 5.1 mmol/L 4.8  Chloride Latest Ref Range: 98 - 111 mmol/L 105  CO2 Latest Ref Range: 22 - 32 mmol/L 26  Glucose Latest Ref Range: 70 - 99 mg/dL 254 (H)  BUN Latest Ref Range: 8 - 23 mg/dL 16  Creatinine Latest Ref Range: 0.44 - 1.00 mg/dL 1.21 (H)  Calcium Latest Ref Range: 8.9 - 10.3 mg/dL 9.5  Anion gap Latest Ref Range: 5 - 15  7  GFR, Est Non African American Latest Ref Range: >60 mL/min 46 (L)  GFR, Est African American Latest Ref Range: >60 mL/min 53 (L)  Iron Latest Ref Range: 28 - 170 ug/dL 73  UIBC Latest Units: ug/dL 309  TIBC Latest Ref Range: 250 - 450 ug/dL 382  Saturation Ratios Latest Ref Range: 10.4 - 31.8 % 19  Ferritin Latest Ref Range: 11 - 307 ng/mL 135  Folate Latest Ref Range: >5.9 ng/mL 17.2  Vitamin B12 Latest Ref Range: 180 - 914 pg/mL 450  WBC Latest Ref Range: 4.0 - 10.5 K/uL 6.6  RBC Latest Ref Range: 3.87 - 5.11 MIL/uL 4.28  Hemoglobin Latest Ref Range: 12.0 - 15.0 g/dL 11.2 (L)  HCT Latest Ref Range: 36.0 -  46.0 % 35.5 (L)  MCV Latest Ref Range: 78.0 - 100.0 fL 82.9  MCH Latest Ref Range: 26.0 - 34.0 pg 26.2  MCHC Latest Ref Range: 30.0 - 36.0 g/dL 31.5  RDW Latest Ref Range: 11.5 - 15.5 % 17.1 (H)  Platelets Latest Ref Range: 150 - 400 K/uL 237     IMAGES: CT abd/pelvis 03/26/2018: IMPRESSION: Recurrent midline ventral hernia containing a portion of the mid transverse colon and a small bowel loop. No evidence of bowel obstruction.  There is also a 2nd midline ventral hernia just superior to the previously repaired ventral hernia which contains fat.  Aortic atherosclerosis.  EKG: Pt will need EKG on DOS  01/12/2017: Sinus  Rhythm .Low voltage in  precordial leads. Old inferior infarct. Old anterior infarct. Nonspecific T-abnormality. Poor R wave progression and Q waves in aVF appear new compared to tracing from 02/03/2016.   CV: TTE 01/26/2016: Study Conclusions  - Left ventricle: The cavity size was normal. Wall thickness was   normal. Systolic function was normal. The estimated ejection   fraction was in the range of 60% to 65%. Wall motion was normal;   there were no regional wall motion abnormalities. Features are   consistent with a pseudonormal left ventricular filling pattern,   with concomitant abnormal relaxation and increased filling   pressure (grade 2 diastolic dysfunction). - Mitral valve: Mild thickening and calcification. There was   moderate regurgitation. - Left atrium: The atrium was mildly dilated. - Right atrium: Central venous pressure (est): 3 mm Hg. - Tricuspid valve: There was mild-moderate regurgitation. - Pulmonary arteries: PA peak pressure: 38 mm Hg (S). - Pericardium, extracardiac: There was no pericardial effusion.  Impressions:  - Normal LV wall thickness with LVEF 60-65%. Grade 2 diastolic   dysfunction with increased LV filling pressure. Mild left atrial   enlargement. Mildly thickened and calcified mitral valve with   moderate mitral regurgitation. Mild to moderate tricuspid   regurgitation with PASP 38 mmHg.   Nuclear stress test 01/08/2015: -Myocardial perfusion is normal. The study is normal. This is a low risk study. Overall left ventricular systolic function was normal. LV cavity size is normal. Nuclear stress EF: 73%. The left ventricular ejection fraction is hyperdynamic (>65%). There is no prior study for comparison.   Past Medical History:  Diagnosis Date  . Allergic rhinitis   . Allergy   . Anemia   . Ankle swelling   . Anxiety    takes Xanax daily   . Arthritis   . Asthma   . Blurred vision, bilateral   . Chronic kidney disease   . Depression    takes Celexa daily   . Diabetes mellitus without complication ([place])    type II; takes Actos daily  . GERD (gastroesophageal reflux disease)   . Gout   . Headache   . Heart murmur   . Heart palpitations   . Hemorrhoids   . History of blood transfusion    no abnormal reaction noted  . History of bronchitis   . Hyperlipidemia   . Hypertension    takes Catapres and Quinapril daily  . Hypothyroidism    takes Synthroid daily  . Moderate mitral insufficiency    a. 12/2014 Echo: EF 55-60%, mod MR, mildly to moderately dil LA, PASP 18mmHg.  . Myocardial infarction ([place])    " Minor"  . Osteoarthritis   . Paroxysmal supraventricular tachycardia ([place])   . Recurrent ventral incisional hernia   . Ringing in ears   .  Shortness of breath dyspnea    ALbuterol daily as needed  . Stevens-Johnson disease ([place])   . Urinary incontinence   . Wears glasses     Past Surgical History:  Procedure Laterality Date  . ABDOMINAL HYSTERECTOMY    . BOWEL RESECTION    . COLON SURGERY    . COLONOSCOPY    . ESOPHAGOGASTRODUODENOSCOPY    . HERNIA REPAIR     hiatal hernia repair  . TOTAL HIP ARTHROPLASTY Left 02/02/2015   Procedure: LEFT TOTAL HIP ARTHROPLASTY;  Surgeon: Garald Balding, MD;  Location: [facility];  Service: Orthopedics;  Laterality: Left;  . TOTAL HIP ARTHROPLASTY Right 02/01/2016   Procedure: TOTAL HIP ARTHROPLASTY;  Surgeon: Garald Balding, MD;  Location: [facility];  Service: Orthopedics;  Laterality: Right;    MEDICATIONS: No current facility-administered medications for this encounter.    [name] acetaminophen (TYLENOL) 500 MG tablet  . albuterol (PROVENTIL HFA;VENTOLIN HFA) 108 (90 Base) MCG/ACT inhaler  . atorvastatin (LIPITOR) 20 MG tablet  . citalopram (CELEXA) 20 MG tablet  . cloNIDine (CATAPRES) 0.1 MG tablet  . ergocalciferol (VITAMIN D2) 50000 units capsule  . fluticasone (FLONASE) 50 MCG/ACT nasal spray  . furosemide (LASIX) 40 MG tablet  . hydrOXYzine (ATARAX/VISTARIL) 10 MG tablet  . insulin  glargine (LANTUS) 100 UNIT/ML injection  . insulin lispro (HUMALOG) 100 UNIT/ML injection  . levothyroxine (SYNTHROID, LEVOTHROID) 100 MCG tablet  . Magnesium 400 MG TABS  . magnesium hydroxide (PHILLIPS MILK OF MAGNESIA) 400 MG/5ML suspension  . metFORMIN (GLUCOPHAGE) 500 MG tablet  . montelukast (SINGULAIR) 10 MG tablet  . omeprazole (PRILOSEC) 40 MG capsule  . potassium chloride SA (K-DUR,KLOR-CON) 20 MEQ tablet  . quinapril (ACCUPRIL) 5 MG tablet  . umeclidinium-vilanterol (ANORO ELLIPTA) 62.5-25 MCG/INH AEPB  . glucose blood test strip  . Insulin Syringe-Needle U-100 (BD INSULIN SYRINGE U/F) 31G X 5/16" 0.5 ML MISC  . metoprolol succinate (TOPROL XL) 25 MG 24 hr tablet      Wynonia Musty [facility] Short Stay Center/Anesthesiology Phone [phone] 04/01/2018 5:53 PM

## 2018-04-01 NOTE — Progress Notes (Signed)
Pt denies SOB and chest pain. Pt stated that she is under the care of Dr. Bronson Ing, Cardiology. Pt denies having a cardiac cath. Pt denies recent labs. Pt made aware to stop taking Aspirin (unless advised otherwise by surgeon), vitamins, fish oil and herbal medications. Do not take any NSAIDs ie: Ibuprofen, Advil, Naproxen (Aleve), Motrin, BC and Goody Powder. Pt made aware to stop clears (water, Gatorade, clear tea, black coffee) at 5:30am for surgery scheduled at 8:30am. Pt made aware to take 50% (12-15 units) of Labntus insulin at HS. Pt made aware to take no Metformin DOS and take 1/2 correction dose of Humalog for ( BG >400 as per MD instructions). Pt made aware to check BG every 2 hours prior to arrival to hospital on DOS. Pt made aware to treat a BG < 70 with  4 ounces of apple or cranberry juice, wait 15 minutes after intervention to recheck BG, if BG remains < 70, call Short Stay unit to speak with a nurse. Pt verbalized understanding of all pre-op instructions.

## 2018-04-01 NOTE — Anesthesia Preprocedure Evaluation (Addendum)
Anesthesia Evaluation  Patient identified by MRN, date of birth, ID band Patient awake    Reviewed: Allergy & Precautions, H&P , NPO status , Patient's Chart, lab work & pertinent test results  Airway Mallampati: II  TM Distance: >3 FB Neck ROM: Full    Dental no notable dental hx. (+) Teeth Intact, Dental Advisory Given   Pulmonary asthma ,    Pulmonary exam normal breath sounds clear to auscultation       Cardiovascular hypertension, Pt. on medications and Pt. on home beta blockers + Past MI  Normal cardiovascular exam+ Valvular Problems/Murmurs  Rhythm:Regular Rate:Normal  Echo 01/26/2016 Left ventricle: The cavity size was normal. Wall thickness was   normal. Systolic function was normal. The estimated ejection   fraction was in the range of 60% to 65%. Wall motion was normal;   there were no regional wall motion abnormalities. Features are   consistent with a pseudonormal left ventricular filling pattern,   with concomitant abnormal relaxation and increased filling   pressure (grade 2 diastolic dysfunction   Neuro/Psych  Headaches, Anxiety Depression    GI/Hepatic GERD  Medicated and Controlled,  Endo/Other  diabetes, Type 2Hypothyroidism   Renal/GU Renal disease     Musculoskeletal   Abdominal (+) + obese,   Peds  Hematology  (+) Blood dyscrasia, anemia ,   Anesthesia Other Findings   Reproductive/Obstetrics                            Lab Results  Component Value Date   WBC 6.6 01/22/2018   HGB 11.2 (L) 01/22/2018   HCT 35.5 (L) 01/22/2018   MCV 82.9 01/22/2018   PLT 237 01/22/2018    Anesthesia Physical Anesthesia Plan  ASA: III  Anesthesia Plan: General   Post-op Pain Management:    Induction: Intravenous  PONV Risk Score and Plan: 3 and Treatment may vary due to age or medical condition, Ondansetron and Dexamethasone  Airway Management Planned: Oral  ETT  Additional Equipment:   Intra-op Plan:   Post-operative Plan: Extubation in OR  Informed Consent: I have reviewed the patients History and Physical, chart, labs and discussed the procedure including the risks, benefits and alternatives for the proposed anesthesia with the patient or authorized representative who has indicated his/her understanding and acceptance.   Dental advisory given  Plan Discussed with: CRNA  Anesthesia Plan Comments:         Anesthesia Quick Evaluation

## 2018-04-02 ENCOUNTER — Encounter (HOSPITAL_COMMUNITY)
Admission: RE | Disposition: A | Discharge status: Home / Self Care | Payer: Self-pay | Source: Ambulatory Visit | Attending: Surgery

## 2018-04-02 ENCOUNTER — Inpatient Hospital Stay (HOSPITAL_COMMUNITY): Payer: Medicare Other | Admitting: Physician Assistant

## 2018-04-02 ENCOUNTER — Encounter (HOSPITAL_COMMUNITY): Payer: Self-pay | Admitting: *Deleted

## 2018-04-02 ENCOUNTER — Inpatient Hospital Stay (HOSPITAL_COMMUNITY)
Admission: RE | Admit: 2018-04-02 | Discharge: 2018-04-05 | DRG: 355 | Disposition: A | Discharge status: Home / Self Care | Payer: Medicare Other | Source: Ambulatory Visit | Attending: Surgery | Admitting: Surgery

## 2018-04-02 DIAGNOSIS — Z23 Encounter for immunization: Secondary | ICD-10-CM | POA: Diagnosis not present

## 2018-04-02 DIAGNOSIS — Z96643 Presence of artificial hip joint, bilateral: Secondary | ICD-10-CM | POA: Diagnosis not present

## 2018-04-02 DIAGNOSIS — I129 Hypertensive chronic kidney disease with stage 1 through stage 4 chronic kidney disease, or unspecified chronic kidney disease: Secondary | ICD-10-CM | POA: Diagnosis present

## 2018-04-02 DIAGNOSIS — E669 Obesity, unspecified: Secondary | ICD-10-CM | POA: Diagnosis present

## 2018-04-02 DIAGNOSIS — Z888 Allergy status to other drugs, medicaments and biological substances status: Secondary | ICD-10-CM

## 2018-04-02 DIAGNOSIS — Z91018 Allergy to other foods: Secondary | ICD-10-CM | POA: Diagnosis not present

## 2018-04-02 DIAGNOSIS — Z6830 Body mass index (BMI) 30.0-30.9, adult: Secondary | ICD-10-CM | POA: Diagnosis not present

## 2018-04-02 DIAGNOSIS — K219 Gastro-esophageal reflux disease without esophagitis: Secondary | ICD-10-CM | POA: Diagnosis present

## 2018-04-02 DIAGNOSIS — E785 Hyperlipidemia, unspecified: Secondary | ICD-10-CM | POA: Diagnosis not present

## 2018-04-02 DIAGNOSIS — J45909 Unspecified asthma, uncomplicated: Secondary | ICD-10-CM | POA: Diagnosis not present

## 2018-04-02 DIAGNOSIS — Z880 Allergy status to penicillin: Secondary | ICD-10-CM

## 2018-04-02 DIAGNOSIS — I252 Old myocardial infarction: Secondary | ICD-10-CM | POA: Diagnosis not present

## 2018-04-02 DIAGNOSIS — I700 Atherosclerosis of aorta: Secondary | ICD-10-CM | POA: Diagnosis not present

## 2018-04-02 DIAGNOSIS — E1022 Type 1 diabetes mellitus with diabetic chronic kidney disease: Secondary | ICD-10-CM | POA: Diagnosis not present

## 2018-04-02 DIAGNOSIS — M109 Gout, unspecified: Secondary | ICD-10-CM | POA: Diagnosis present

## 2018-04-02 DIAGNOSIS — Z79899 Other long term (current) drug therapy: Secondary | ICD-10-CM

## 2018-04-02 DIAGNOSIS — Z886 Allergy status to analgesic agent status: Secondary | ICD-10-CM | POA: Diagnosis not present

## 2018-04-02 DIAGNOSIS — M1990 Unspecified osteoarthritis, unspecified site: Secondary | ICD-10-CM | POA: Diagnosis present

## 2018-04-02 DIAGNOSIS — F419 Anxiety disorder, unspecified: Secondary | ICD-10-CM | POA: Diagnosis present

## 2018-04-02 DIAGNOSIS — N189 Chronic kidney disease, unspecified: Secondary | ICD-10-CM | POA: Diagnosis not present

## 2018-04-02 DIAGNOSIS — I081 Rheumatic disorders of both mitral and tricuspid valves: Secondary | ICD-10-CM | POA: Diagnosis not present

## 2018-04-02 DIAGNOSIS — Z882 Allergy status to sulfonamides status: Secondary | ICD-10-CM | POA: Diagnosis not present

## 2018-04-02 DIAGNOSIS — E039 Hypothyroidism, unspecified: Secondary | ICD-10-CM | POA: Diagnosis not present

## 2018-04-02 DIAGNOSIS — K432 Incisional hernia without obstruction or gangrene: Secondary | ICD-10-CM | POA: Diagnosis not present

## 2018-04-02 HISTORY — DX: Headache, unspecified: R51.9

## 2018-04-02 HISTORY — PX: INSERTION OF MESH: SHX5868

## 2018-04-02 HISTORY — DX: Acute myocardial infarction, unspecified: I21.9

## 2018-04-02 HISTORY — DX: Incisional hernia without obstruction or gangrene: K43.2

## 2018-04-02 HISTORY — PX: INCISIONAL HERNIA REPAIR: SHX193

## 2018-04-02 HISTORY — DX: Presence of spectacles and contact lenses: Z97.3

## 2018-04-02 HISTORY — DX: Headache: R51

## 2018-04-02 LAB — CBC
HCT: 35.5 % — ABNORMAL LOW (ref 36.0–46.0)
Hemoglobin: 10.6 g/dL — ABNORMAL LOW (ref 12.0–15.0)
MCH: 24.7 pg — ABNORMAL LOW (ref 26.0–34.0)
MCHC: 29.9 g/dL — ABNORMAL LOW (ref 30.0–36.0)
MCV: 82.8 fL (ref 80.0–100.0)
NRBC: 0 % (ref 0.0–0.2)
PLATELETS: 291 10*3/uL (ref 150–400)
RBC: 4.29 MIL/uL (ref 3.87–5.11)
RDW: 12.9 % (ref 11.5–15.5)
WBC: 8.7 10*3/uL (ref 4.0–10.5)

## 2018-04-02 LAB — BASIC METABOLIC PANEL
Anion gap: 13 (ref 5–15)
BUN: 30 mg/dL — ABNORMAL HIGH (ref 8–23)
CALCIUM: 9.8 mg/dL (ref 8.9–10.3)
CHLORIDE: 104 mmol/L (ref 98–111)
CO2: 20 mmol/L — ABNORMAL LOW (ref 22–32)
CREATININE: 1.31 mg/dL — AB (ref 0.44–1.00)
GFR calc non Af Amer: 41 mL/min — ABNORMAL LOW (ref >60)
GFR, EST AFRICAN AMERICAN: 48 mL/min — AB (ref >60)
Glucose, Bld: 107 mg/dL — ABNORMAL HIGH (ref 70–99)
Potassium: 4 mmol/L (ref 3.5–5.1)
Sodium: 137 mmol/L (ref 135–145)

## 2018-04-02 LAB — GLUCOSE, CAPILLARY
GLUCOSE-CAPILLARY: 154 mg/dL — AB (ref 70–99)
GLUCOSE-CAPILLARY: 130 mg/dL — AB (ref 70–99)
Glucose-Capillary: 137 mg/dL — ABNORMAL HIGH (ref 70–99)
Glucose-Capillary: 313 mg/dL — ABNORMAL HIGH (ref 70–99)
Glucose-Capillary: 173 mg/dL — ABNORMAL HIGH (ref 70–99)

## 2018-04-02 SURGERY — REPAIR, HERNIA, INCISIONAL
Anesthesia: General | Site: Abdomen

## 2018-04-02 MED ORDER — INSULIN ASPART 100 UNIT/ML ~~LOC~~ SOLN
4.0000 [IU] | Freq: Three times a day (TID) | SUBCUTANEOUS | Status: DC
  Administered 2018-04-02 – 2018-04-05 (×4): 4 [IU] via SUBCUTANEOUS

## 2018-04-02 MED ORDER — LISINOPRIL 10 MG PO TABS
10.0000 mg | ORAL_TABLET | Freq: Every day | ORAL | Status: DC
  Administered 2018-04-02 – 2018-04-05 (×4): 10 mg via ORAL
  Filled 2018-04-02 (×4): qty 1

## 2018-04-02 MED ORDER — ALBUTEROL SULFATE (2.5 MG/3ML) 0.083% IN NEBU
3.0000 mL | INHALATION_SOLUTION | Freq: Every day | RESPIRATORY_TRACT | Status: DC
  Administered 2018-04-03 – 2018-04-05 (×3): 3 mL via RESPIRATORY_TRACT
  Filled 2018-04-02 (×3): qty 3

## 2018-04-02 MED ORDER — FENTANYL CITRATE (PF) 100 MCG/2ML IJ SOLN: 25.0000 ug | INTRAMUSCULAR | Status: DC | PRN

## 2018-04-02 MED ORDER — ONDANSETRON HCL 4 MG/2ML IJ SOLN
INTRAMUSCULAR | Status: DC | PRN
  Administered 2018-04-02: 4 mg via INTRAVENOUS

## 2018-04-02 MED ORDER — LEVOTHYROXINE SODIUM 100 MCG PO TABS
100.0000 ug | ORAL_TABLET | Freq: Every day | ORAL | Status: DC
  Administered 2018-04-03 – 2018-04-05 (×3): 100 ug via ORAL
  Filled 2018-04-02 (×3): qty 1

## 2018-04-02 MED ORDER — CITALOPRAM HYDROBROMIDE 20 MG PO TABS
20.0000 mg | ORAL_TABLET | Freq: Every day | ORAL | Status: DC
  Administered 2018-04-03 – 2018-04-05 (×3): 20 mg via ORAL
  Filled 2018-04-02 (×3): qty 1

## 2018-04-02 MED ORDER — INSULIN ASPART 100 UNIT/ML ~~LOC~~ SOLN
0.0000 [IU] | Freq: Three times a day (TID) | SUBCUTANEOUS | Status: DC
  Administered 2018-04-02: 11 [IU] via SUBCUTANEOUS
  Administered 2018-04-04: 2 [IU] via SUBCUTANEOUS
  Administered 2018-04-05: 3 [IU] via SUBCUTANEOUS

## 2018-04-02 MED ORDER — FLUTICASONE PROPIONATE 50 MCG/ACT NA SUSP
1.0000 | Freq: Two times a day (BID) | NASAL | Status: DC | PRN
  Administered 2018-04-04: 1 via NASAL
  Filled 2018-04-02: qty 16

## 2018-04-02 MED ORDER — DOCUSATE SODIUM 100 MG PO CAPS
100.0000 mg | ORAL_CAPSULE | Freq: Two times a day (BID) | ORAL | Status: DC
  Administered 2018-04-02 – 2018-04-05 (×6): 100 mg via ORAL
  Filled 2018-04-02 (×6): qty 1

## 2018-04-02 MED ORDER — ACETAMINOPHEN 500 MG PO TABS
1000.0000 mg | ORAL_TABLET | ORAL | Status: AC
  Administered 2018-04-02: 1000 mg via ORAL
  Filled 2018-04-02: qty 2

## 2018-04-02 MED ORDER — BUPIVACAINE HCL (PF) 0.25 % IJ SOLN
INTRAMUSCULAR | Status: AC
  Filled 2018-04-02: qty 30

## 2018-04-02 MED ORDER — DEXAMETHASONE SODIUM PHOSPHATE 10 MG/ML IJ SOLN
INTRAMUSCULAR | Status: AC
  Filled 2018-04-02: qty 1

## 2018-04-02 MED ORDER — ONDANSETRON HCL 4 MG/2ML IJ SOLN: 4.0000 mg | Freq: Once | INTRAMUSCULAR | Status: DC | PRN

## 2018-04-02 MED ORDER — CEFAZOLIN SODIUM-DEXTROSE 2-4 GM/100ML-% IV SOLN
2.0000 g | Freq: Three times a day (TID) | INTRAVENOUS | Status: AC
  Administered 2018-04-02: 2 g via INTRAVENOUS
  Filled 2018-04-02: qty 100

## 2018-04-02 MED ORDER — FUROSEMIDE 40 MG PO TABS
40.0000 mg | ORAL_TABLET | Freq: Every day | ORAL | Status: DC
  Administered 2018-04-02 – 2018-04-05 (×4): 40 mg via ORAL
  Filled 2018-04-02 (×4): qty 1

## 2018-04-02 MED ORDER — ONDANSETRON HCL 4 MG/2ML IJ SOLN
INTRAMUSCULAR | Status: AC
  Filled 2018-04-02: qty 2

## 2018-04-02 MED ORDER — PANTOPRAZOLE SODIUM 40 MG PO TBEC
40.0000 mg | DELAYED_RELEASE_TABLET | Freq: Every day | ORAL | Status: DC
  Administered 2018-04-03 – 2018-04-05 (×3): 40 mg via ORAL
  Filled 2018-04-02 (×3): qty 1

## 2018-04-02 MED ORDER — METOPROLOL SUCCINATE ER 25 MG PO TB24
25.0000 mg | ORAL_TABLET | Freq: Every day | ORAL | Status: DC
  Administered 2018-04-03 – 2018-04-05 (×3): 25 mg via ORAL
  Filled 2018-04-02 (×3): qty 1

## 2018-04-02 MED ORDER — GABAPENTIN 300 MG PO CAPS
300.0000 mg | ORAL_CAPSULE | ORAL | Status: AC
  Administered 2018-04-02: 300 mg via ORAL
  Filled 2018-04-02: qty 1

## 2018-04-02 MED ORDER — ATORVASTATIN CALCIUM 20 MG PO TABS
20.0000 mg | ORAL_TABLET | Freq: Every day | ORAL | Status: DC
  Administered 2018-04-02 – 2018-04-04 (×3): 20 mg via ORAL
  Filled 2018-04-02 (×3): qty 1

## 2018-04-02 MED ORDER — FENTANYL CITRATE (PF) 100 MCG/2ML IJ SOLN
INTRAMUSCULAR | Status: DC | PRN
  Administered 2018-04-02: 100 ug via INTRAVENOUS
  Administered 2018-04-02: 50 ug via INTRAVENOUS

## 2018-04-02 MED ORDER — MIDAZOLAM HCL 5 MG/5ML IJ SOLN
INTRAMUSCULAR | Status: DC | PRN
  Administered 2018-04-02: 2 mg via INTRAVENOUS

## 2018-04-02 MED ORDER — LACTATED RINGERS IV SOLN
INTRAVENOUS | Status: DC
  Administered 2018-04-02: 07:00:00 via INTRAVENOUS

## 2018-04-02 MED ORDER — METFORMIN HCL 500 MG PO TABS
500.0000 mg | ORAL_TABLET | Freq: Two times a day (BID) | ORAL | Status: DC
  Administered 2018-04-02 – 2018-04-05 (×6): 500 mg via ORAL
  Filled 2018-04-02 (×6): qty 1

## 2018-04-02 MED ORDER — KETAMINE HCL 10 MG/ML IJ SOLN
INTRAMUSCULAR | Status: DC | PRN
  Administered 2018-04-02 (×2): 25 mg via INTRAVENOUS

## 2018-04-02 MED ORDER — OXYCODONE HCL 5 MG PO TABS
5.0000 mg | ORAL_TABLET | Freq: Four times a day (QID) | ORAL | Status: DC | PRN
  Administered 2018-04-02 – 2018-04-04 (×5): 5 mg via ORAL
  Filled 2018-04-02 (×5): qty 1

## 2018-04-02 MED ORDER — BUPIVACAINE-EPINEPHRINE 0.25% -1:200000 IJ SOLN
INTRAMUSCULAR | Status: DC | PRN
  Administered 2018-04-02: 10 mL

## 2018-04-02 MED ORDER — CLONIDINE HCL 0.1 MG PO TABS
0.1000 mg | ORAL_TABLET | Freq: Every day | ORAL | Status: DC
  Administered 2018-04-02 – 2018-04-05 (×4): 0.1 mg via ORAL
  Filled 2018-04-02 (×4): qty 1

## 2018-04-02 MED ORDER — KETAMINE HCL 50 MG/5ML IJ SOSY
PREFILLED_SYRINGE | INTRAMUSCULAR | Status: AC
  Filled 2018-04-02: qty 5

## 2018-04-02 MED ORDER — FENTANYL CITRATE (PF) 250 MCG/5ML IJ SOLN
INTRAMUSCULAR | Status: AC
  Filled 2018-04-02: qty 5

## 2018-04-02 MED ORDER — CHLORHEXIDINE GLUCONATE CLOTH 2 % EX PADS: 6.0000 | MEDICATED_PAD | Freq: Once | CUTANEOUS | Status: DC

## 2018-04-02 MED ORDER — DIPHENHYDRAMINE HCL 50 MG/ML IJ SOLN: 12.5000 mg | Freq: Four times a day (QID) | INTRAMUSCULAR | Status: DC | PRN

## 2018-04-02 MED ORDER — DIPHENHYDRAMINE HCL 12.5 MG/5ML PO ELIX: 12.5000 mg | ORAL_SOLUTION | Freq: Four times a day (QID) | ORAL | Status: DC | PRN

## 2018-04-02 MED ORDER — CEFAZOLIN SODIUM-DEXTROSE 2-4 GM/100ML-% IV SOLN
2.0000 g | INTRAVENOUS | Status: AC
  Administered 2018-04-02: 2 g via INTRAVENOUS
  Filled 2018-04-02: qty 100

## 2018-04-02 MED ORDER — ONDANSETRON HCL 4 MG/2ML IJ SOLN: 4.0000 mg | Freq: Four times a day (QID) | INTRAMUSCULAR | Status: DC | PRN

## 2018-04-02 MED ORDER — SUGAMMADEX SODIUM 500 MG/5ML IV SOLN
INTRAVENOUS | Status: DC | PRN
  Administered 2018-04-02: 330 mg via INTRAVENOUS

## 2018-04-02 MED ORDER — ROCURONIUM BROMIDE 50 MG/5ML IV SOSY
PREFILLED_SYRINGE | INTRAVENOUS | Status: DC | PRN
  Administered 2018-04-02: 50 mg via INTRAVENOUS
  Administered 2018-04-02: 20 mg via INTRAVENOUS

## 2018-04-02 MED ORDER — TRAMADOL HCL 50 MG PO TABS
50.0000 mg | ORAL_TABLET | Freq: Four times a day (QID) | ORAL | Status: DC | PRN
  Administered 2018-04-02 – 2018-04-03 (×2): 50 mg via ORAL
  Filled 2018-04-02 (×2): qty 1

## 2018-04-02 MED ORDER — MIDAZOLAM HCL 2 MG/2ML IJ SOLN
INTRAMUSCULAR | Status: AC
  Filled 2018-04-02: qty 2

## 2018-04-02 MED ORDER — BUPIVACAINE-EPINEPHRINE (PF) 0.25% -1:200000 IJ SOLN
INTRAMUSCULAR | Status: AC
  Filled 2018-04-02: qty 30

## 2018-04-02 MED ORDER — GABAPENTIN 300 MG PO CAPS
300.0000 mg | ORAL_CAPSULE | Freq: Two times a day (BID) | ORAL | Status: DC
  Administered 2018-04-02 – 2018-04-05 (×6): 300 mg via ORAL
  Filled 2018-04-02 (×6): qty 1

## 2018-04-02 MED ORDER — PROPOFOL 10 MG/ML IV BOLUS
INTRAVENOUS | Status: DC | PRN
  Administered 2018-04-02: 150 mg via INTRAVENOUS

## 2018-04-02 MED ORDER — POTASSIUM CHLORIDE CRYS ER 20 MEQ PO TBCR
40.0000 meq | EXTENDED_RELEASE_TABLET | Freq: Every day | ORAL | Status: DC
  Administered 2018-04-02 – 2018-04-05 (×4): 40 meq via ORAL
  Filled 2018-04-02 (×4): qty 2

## 2018-04-02 MED ORDER — METHOCARBAMOL 500 MG PO TABS
500.0000 mg | ORAL_TABLET | Freq: Four times a day (QID) | ORAL | Status: DC | PRN
  Administered 2018-04-03 (×2): 500 mg via ORAL
  Filled 2018-04-02 (×2): qty 1

## 2018-04-02 MED ORDER — ACETAMINOPHEN 500 MG PO TABS
1000.0000 mg | ORAL_TABLET | Freq: Four times a day (QID) | ORAL | Status: DC
  Administered 2018-04-02 – 2018-04-04 (×6): 1000 mg via ORAL
  Filled 2018-04-02 (×7): qty 2

## 2018-04-02 MED ORDER — ONDANSETRON 4 MG PO TBDP: 4.0000 mg | ORAL_TABLET | Freq: Four times a day (QID) | ORAL | Status: DC | PRN

## 2018-04-02 MED ORDER — UMECLIDINIUM-VILANTEROL 62.5-25 MCG/INH IN AEPB
1.0000 | INHALATION_SPRAY | Freq: Every day | RESPIRATORY_TRACT | Status: DC
  Administered 2018-04-03 – 2018-04-05 (×3): 1 via RESPIRATORY_TRACT
  Filled 2018-04-02: qty 14

## 2018-04-02 MED ORDER — DEXMEDETOMIDINE HCL IN NACL 200 MCG/50ML IV SOLN
INTRAVENOUS | Status: DC | PRN
  Administered 2018-04-02 (×2): 8 ug via INTRAVENOUS
  Administered 2018-04-02: 4 ug via INTRAVENOUS

## 2018-04-02 MED ORDER — POTASSIUM CHLORIDE IN NACL 20-0.9 MEQ/L-% IV SOLN
INTRAVENOUS | Status: DC
  Administered 2018-04-02 – 2018-04-04 (×3): via INTRAVENOUS
  Filled 2018-04-02 (×3): qty 1000

## 2018-04-02 MED ORDER — PROPOFOL 10 MG/ML IV BOLUS
INTRAVENOUS | Status: AC
  Filled 2018-04-02: qty 20

## 2018-04-02 MED ORDER — LIDOCAINE 2% (20 MG/ML) 5 ML SYRINGE
INTRAMUSCULAR | Status: AC
  Filled 2018-04-02: qty 5

## 2018-04-02 MED ORDER — SODIUM CHLORIDE 0.9 % IJ SOLN
INTRAMUSCULAR | Status: DC | PRN
  Administered 2018-04-02: 20 mL via INTRAVENOUS

## 2018-04-02 MED ORDER — MONTELUKAST SODIUM 10 MG PO TABS
10.0000 mg | ORAL_TABLET | Freq: Every day | ORAL | Status: DC
  Administered 2018-04-02 – 2018-04-04 (×3): 10 mg via ORAL
  Filled 2018-04-02 (×3): qty 1

## 2018-04-02 MED ORDER — ENOXAPARIN SODIUM 40 MG/0.4ML ~~LOC~~ SOLN: 40.0000 mg | SUBCUTANEOUS | Status: DC

## 2018-04-02 MED ORDER — MORPHINE SULFATE (PF) 2 MG/ML IV SOLN
2.0000 mg | INTRAVENOUS | Status: DC | PRN
  Administered 2018-04-02 – 2018-04-03 (×2): 2 mg via INTRAVENOUS
  Filled 2018-04-02 (×2): qty 1

## 2018-04-02 MED ORDER — BUPIVACAINE LIPOSOME 1.3 % IJ SUSP
20.0000 mL | Freq: Once | INTRAMUSCULAR | Status: AC
  Administered 2018-04-02: 20 mL
  Filled 2018-04-02: qty 20

## 2018-04-02 MED ORDER — DEXAMETHASONE SODIUM PHOSPHATE 10 MG/ML IJ SOLN
INTRAMUSCULAR | Status: DC | PRN
  Administered 2018-04-02: 10 mg via INTRAVENOUS

## 2018-04-02 MED ORDER — LIDOCAINE 2% (20 MG/ML) 5 ML SYRINGE
INTRAMUSCULAR | Status: DC | PRN
  Administered 2018-04-02: 100 mg via INTRAVENOUS

## 2018-04-02 MED ORDER — KETOROLAC TROMETHAMINE 15 MG/ML IJ SOLN: 15.0000 mg | Freq: Once | INTRAMUSCULAR | Status: DC | PRN

## 2018-04-02 MED ORDER — 0.9 % SODIUM CHLORIDE (POUR BTL) OPTIME
TOPICAL | Status: DC | PRN
  Administered 2018-04-02: 1000 mL

## 2018-04-02 SURGICAL SUPPLY — 49 items
BENZOIN TINCTURE PRP APPL 2/3 (GAUZE/BANDAGES/DRESSINGS) ×3 IMPLANT
BINDER ABDOMINAL 12 ML 46-62 (SOFTGOODS) ×3 IMPLANT
BLADE CLIPPER SURG (BLADE) IMPLANT
CANISTER SUCT 3000ML PPV (MISCELLANEOUS) ×3 IMPLANT
CHLORAPREP W/TINT 26ML (MISCELLANEOUS) IMPLANT
CLOSURE WOUND 1/2 X4 (GAUZE/BANDAGES/DRESSINGS) ×1
COVER SURGICAL LIGHT HANDLE (MISCELLANEOUS) ×3 IMPLANT
COVER WAND RF STERILE (DRAPES) ×3 IMPLANT
DRAPE LAPAROSCOPIC ABDOMINAL (DRAPES) ×3 IMPLANT
DRSG COVADERM 4X8 (GAUZE/BANDAGES/DRESSINGS) ×3 IMPLANT
DRSG TEGADERM 4X4.75 (GAUZE/BANDAGES/DRESSINGS) ×3 IMPLANT
ELECT BLADE 4.0 EZ CLEAN MEGAD (MISCELLANEOUS) ×3
ELECT CAUTERY BLADE 6.4 (BLADE) ×3 IMPLANT
ELECT REM PT RETURN 9FT ADLT (ELECTROSURGICAL) ×3
ELECTRODE BLDE 4.0 EZ CLN MEGD (MISCELLANEOUS) ×1 IMPLANT
ELECTRODE REM PT RTRN 9FT ADLT (ELECTROSURGICAL) ×1 IMPLANT
GAUZE SPONGE 4X4 12PLY STRL (GAUZE/BANDAGES/DRESSINGS) ×3 IMPLANT
GLOVE BIO SURGEON STRL SZ 6.5 (GLOVE) ×2 IMPLANT
GLOVE BIO SURGEON STRL SZ7 (GLOVE) ×3 IMPLANT
GLOVE BIO SURGEON STRL SZ7.5 (GLOVE) ×3 IMPLANT
GLOVE BIO SURGEONS STRL SZ 6.5 (GLOVE) ×1
GLOVE BIOGEL PI IND STRL 7.0 (GLOVE) ×1 IMPLANT
GLOVE BIOGEL PI IND STRL 7.5 (GLOVE) ×2 IMPLANT
GLOVE BIOGEL PI INDICATOR 7.0 (GLOVE) ×2
GLOVE BIOGEL PI INDICATOR 7.5 (GLOVE) ×4
GOWN STRL REUS W/ TWL LRG LVL3 (GOWN DISPOSABLE) ×2 IMPLANT
GOWN STRL REUS W/TWL LRG LVL3 (GOWN DISPOSABLE) ×4
KIT BASIN OR (CUSTOM PROCEDURE TRAY) ×3 IMPLANT
KIT TURNOVER KIT B (KITS) ×3 IMPLANT
MESH VENTRALIGHT ST 6IN CRC (Mesh General) ×3 IMPLANT
NEEDLE HYPO 25GX1X1/2 BEV (NEEDLE) ×3 IMPLANT
NS IRRIG 1000ML POUR BTL (IV SOLUTION) ×3 IMPLANT
PACK GENERAL/GYN (CUSTOM PROCEDURE TRAY) ×3 IMPLANT
PAD ARMBOARD 7.5X6 YLW CONV (MISCELLANEOUS) ×6 IMPLANT
PEN SKIN MARKING BROAD (MISCELLANEOUS) ×3 IMPLANT
PENCIL SMOKE EVACUATOR (MISCELLANEOUS) ×3 IMPLANT
SLEEVE SUCTION 125 (MISCELLANEOUS) ×3 IMPLANT
STRIP CLOSURE SKIN 1/2X4 (GAUZE/BANDAGES/DRESSINGS) ×2 IMPLANT
SUT MNCRL AB 4-0 PS2 18 (SUTURE) ×3 IMPLANT
SUT NOVA NAB DX-16 0-1 5-0 T12 (SUTURE) ×6 IMPLANT
SUT NOVA NAB GS-21 0 18 T12 DT (SUTURE) ×12 IMPLANT
SUT NOVA NAB GS-21 1 T12 (SUTURE) IMPLANT
SUT SILK 3 0SH CR/8 30 (SUTURE) ×3 IMPLANT
SUT VIC AB 3-0 SH 27 (SUTURE) ×2
SUT VIC AB 3-0 SH 27XBRD (SUTURE) ×1 IMPLANT
SYR CONTROL 10ML LL (SYRINGE) ×3 IMPLANT
TOWEL OR 17X24 6PK STRL BLUE (TOWEL DISPOSABLE) ×3 IMPLANT
TOWEL OR 17X26 10 PK STRL BLUE (TOWEL DISPOSABLE) ×3 IMPLANT
TRAY FOLEY MTR SLVR 14FR STAT (SET/KITS/TRAYS/PACK) IMPLANT

## 2018-04-02 NOTE — Interval H&P Note (Signed)
History and Physical Interval Note:  04/02/2018 6:28 AM  [name]  has presented today for surgery, with the diagnosis of RECURRENT VNETRAL INCISIONAL HERNIA  The various methods of treatment have been discussed with the patient and family. After consideration of risks, benefits and other options for treatment, the patient has consented to  Procedure(s): OPEN VENTRAL HERNIA REPAIR WITH MESH (N/A) INSERTION OF MESH (N/A) as a surgical intervention .  The patient's history has been reviewed, patient examined, no change in status, stable for surgery.  I have reviewed the patient's chart and labs.  Questions were answered to the patient's satisfaction.     Maia Petties

## 2018-04-02 NOTE — Op Note (Signed)
Ventral Hernia Repair Procedure Note  Indications: This is a 67 year old female with multiple medical issues who presents with a recurrent ventral hernia.  She has had multiple previous abdominal surgeries.  She had a laparoscopic ventral hernia repair with mesh.  About 1 year ago she began developing a bulge and discomfort in her epigastrium.  CT scan showed a two adjacent Midline ventral hernias.  The lower hernia contains the old hernia mesh from the laparoscopic repair along with multiple tacks.  Pre-operative Diagnosis: Recurrent ventral incisional hernia  Post-operative Diagnosis:   Same  Surgeon: Maia Petties   Assistants: Sharyn Dross RNFA  Anesthesia: General endotracheal anesthesia  ASA Class: 2  Procedure Details  The patient was seen in the Holding Room. The risks, benefits, complications, treatment options, and expected outcomes were discussed with the patient. The possibilities of reaction to medication, pulmonary aspiration, perforation of viscus, bleeding, recurrent infection, the need for additional procedures, failure to diagnose a condition, and creating a complication requiring transfusion or operation were discussed with the patient. The patient concurred with the proposed plan, giving informed consent.  The site of surgery properly noted/marked. The patient was taken to the operating room, identified as IVALEE STRAUSER and the procedure verified as ventral hernia repair. A Time Out was held and the above information confirmed.  The patient was placed supine.  After establishing general anesthesia, the abdomen was prepped with Chloraprep and draped in sterile fashion.  We made a vertical incision over the palpable hernia. Dissection was carried down to the hernia sac located above the fascia and mobilized from surrounding structures.  Intact fascia was identified circumferentially around the defect.  We entered the hernia sac and took down the adhesions surrounding the  fascial edges.  Inferiorly, we encountered the old hernia mesh with multiple sutures and tacks.  The small bowel is adherent to the undersurface of the mesh.  We took approximately 60 minutes to carefully dissect the small bowel away from the undersurface of the mesh.  There were no defects made in the small bowel. The defect measured 7 cm.  We used a 15 cm Ventralight mesh and secured this to the fascia with interrupted 0 Novofil sutures.  We infiltrated Exparel into the preperitoneal space.  The fascial defect was reapproximated with interrupted figure-of-8 1 Novofil sutures.  The subcutaneous tissues were irrigated.  Staples were used to close the skin.  Instrument, sponge, and needle counts were correct prior to closure and at the conclusion of the case.   Findings: Two defects - total defect measured 7 cm  Estimated Blood Loss:  less than 100 mL         Drains: none                      Complications:  None; patient tolerated the procedure well.         Disposition: PACU - hemodynamically stable.         Condition: stable  Imogene Burn. Georgette Dover, MD, [facility] Surgery  General/ Trauma Surgery Beeper [phone]  04/02/2018 10:57 AM

## 2018-04-02 NOTE — Transfer of Care (Signed)
Immediate Anesthesia Transfer of Care Note  Patient: [name]  Procedure(s) Performed: OPEN VENTRAL HERNIA REPAIR WITH MESH, Explantation of hernia mesh (N/A Abdomen) INSERTION OF MESH (N/A Abdomen)  Patient Location: PACU  Anesthesia Type:General  Level of Consciousness: awake and patient cooperative  Airway & Oxygen Therapy: Patient Spontanous Breathing and Patient connected to face mask oxygen  Post-op Assessment: Report given to RN and Post -op Vital signs reviewed and stable  Post vital signs: Reviewed and stable  Last Vitals:  Vitals Value Taken Time  BP    Temp    Pulse    Resp    SpO2      Last Pain:  Vitals:   04/02/18 0707  TempSrc:   PainSc: 0-No pain      Patients Stated Pain Goal: 1 (09/23/62 3837)  Complications: No apparent anesthesia complications

## 2018-04-02 NOTE — Anesthesia Postprocedure Evaluation (Signed)
Anesthesia Post Note  Patient: [name]  Procedure(s) Performed: OPEN VENTRAL HERNIA REPAIR WITH MESH, Explantation of hernia mesh (N/A Abdomen) INSERTION OF MESH (N/A Abdomen)     Patient location during evaluation: PACU Anesthesia Type: General Level of consciousness: awake and alert Pain management: pain level controlled Vital Signs Assessment: post-procedure vital signs reviewed and stable Respiratory status: spontaneous breathing, nonlabored ventilation, respiratory function stable and patient connected to nasal cannula oxygen Cardiovascular status: blood pressure returned to baseline and stable Postop Assessment: no apparent nausea or vomiting Anesthetic complications: no    Last Vitals:  Vitals:   04/02/18 1114 04/02/18 1236  BP: 107/60 116/71  Pulse: 78 74  Resp:  18  Temp:  36.8 C  SpO2: 97% 100%    Last Pain:  Vitals:   04/02/18 1236  TempSrc: Oral  PainSc:                  Barnet [place]

## 2018-04-02 NOTE — Anesthesia Procedure Notes (Addendum)
Procedure Name: Intubation Date/Time: 04/02/2018 8:36 AM Performed by: Lance Coon, CRNA Pre-anesthesia Checklist: Patient identified, Emergency Drugs available, Suction available, Patient being monitored and Timeout performed Patient Re-evaluated:Patient Re-evaluated prior to induction Oxygen Delivery Method: Circle system utilized Preoxygenation: Pre-oxygenation with 100% oxygen Induction Type: IV induction Ventilation: Mask ventilation without difficulty Laryngoscope Size: Miller and 2 Grade View: Grade I Tube type: Oral Tube size: 7.0 mm Number of attempts: 1 Airway Equipment and Method: Stylet Placement Confirmation: ETT inserted through vocal cords under direct vision,  positive ETCO2 and breath sounds checked- equal and bilateral Secured at: 21 cm Tube secured with: Tape Dental Injury: Teeth and Oropharynx as per pre-operative assessment

## 2018-04-02 NOTE — Plan of Care (Signed)

## 2018-04-03 ENCOUNTER — Encounter (HOSPITAL_COMMUNITY): Payer: Self-pay | Admitting: Surgery

## 2018-04-03 LAB — CBC
HEMATOCRIT: 30.5 % — AB (ref 36.0–46.0)
Hemoglobin: 9.2 g/dL — ABNORMAL LOW (ref 12.0–15.0)
MCH: 24.1 pg — AB (ref 26.0–34.0)
MCHC: 30.2 g/dL (ref 30.0–36.0)
MCV: 79.8 fL — ABNORMAL LOW (ref 80.0–100.0)
Platelets: 280 10*3/uL (ref 150–400)
RBC: 3.82 MIL/uL — AB (ref 3.87–5.11)
RDW: 12.9 % (ref 11.5–15.5)
WBC: 9.8 10*3/uL (ref 4.0–10.5)
nRBC: 0 % (ref 0.0–0.2)

## 2018-04-03 LAB — BASIC METABOLIC PANEL
Anion gap: 8 (ref 5–15)
BUN: 21 mg/dL (ref 8–23)
CHLORIDE: 106 mmol/L (ref 98–111)
CO2: 24 mmol/L (ref 22–32)
CREATININE: 1 mg/dL (ref 0.44–1.00)
Calcium: 9.3 mg/dL (ref 8.9–10.3)
GFR calc Af Amer: >60 mL/min (ref >60)
GFR calc non Af Amer: 57 mL/min — ABNORMAL LOW (ref >60)
Glucose, Bld: 132 mg/dL — ABNORMAL HIGH (ref 70–99)
POTASSIUM: 4.5 mmol/L (ref 3.5–5.1)
Sodium: 138 mmol/L (ref 135–145)

## 2018-04-03 LAB — GLUCOSE, CAPILLARY
GLUCOSE-CAPILLARY: 117 mg/dL — AB (ref 70–99)
GLUCOSE-CAPILLARY: 71 mg/dL (ref 70–99)
Glucose-Capillary: 109 mg/dL — ABNORMAL HIGH (ref 70–99)
Glucose-Capillary: 116 mg/dL — ABNORMAL HIGH (ref 70–99)

## 2018-04-03 LAB — HEMOGLOBIN A1C
Hgb A1c MFr Bld: 7 % — ABNORMAL HIGH (ref 4.8–5.6)
Mean Plasma Glucose: 154 mg/dL

## 2018-04-03 NOTE — Progress Notes (Signed)
1 Day Post-Op   Subjective/Chief Complaint: Patient is sore. No nausea VOiding well Had some oozing from her incision last night Using some IV pain meds   Objective: Vital signs in last 24 hours: Temp:  [97.9 F (36.6 C)-98.6 F (37 C)] 98.2 F (36.8 C) (10/23 0533) Pulse Rate:  [74-88] 84 (10/23 0533) Resp:  [16-18] 16 (10/23 0533) BP: (107-160)/(58-116) 160/72 (10/23 0533) SpO2:  [97 %-100 %] 100 % (10/23 0533)    Intake/Output from previous day: 10/22 0701 - 10/23 0700 In: 2071.6 [P.O.:1100; I.V.:971.6] Out: 2745 [Urine:2725; Blood:20] Intake/Output this shift: No intake/output data recorded.  General appearance: alert, cooperative and no distress Resp: clear to auscultation bilaterally Cardio: regular rate and rhythm, S1, S2 normal, no murmur, click, rub or gallop GI: soft, incisional tenderness Staple line intact; no oozing currently; no hematoma  Lab Results:  Recent Labs    04/02/18 0655 04/03/18 0159  WBC 8.7 9.8  HGB 10.6* 9.2*  HCT 35.5* 30.5*  PLT 291 280   BMET Recent Labs    04/02/18 0655 04/03/18 0159  NA 137 138  K 4.0 4.5  CL 104 106  CO2 20* 24  GLUCOSE 107* 132*  BUN 30* 21  CREATININE 1.31* 1.00  CALCIUM 9.8 9.3   PT/INR No results for input(s): LABPROT, INR in the last 72 hours. ABG No results for input(s): PHART, HCO3 in the last 72 hours.  Invalid input(s): PCO2, PO2  Studies/Results: No results found.  Anti-infectives: Anti-infectives (From admission, onward)   Start     Dose/Rate Route Frequency Ordered Stop   04/02/18 1630  ceFAZolin (ANCEF) IVPB 2g/100 mL premix     2 g 200 mL/hr over 30 Minutes Intravenous Every 8 hours 04/02/18 1201 04/02/18 1729   04/02/18 0645  ceFAZolin (ANCEF) IVPB 2g/100 mL premix     2 g 200 mL/hr over 30 Minutes Intravenous On call to O.R. 04/02/18 0639 04/02/18 0840      Assessment/Plan: s/p Procedure(s): OPEN VENTRAL HERNIA REPAIR WITH MESH, Explantation of hernia mesh  (N/A) INSERTION OF MESH (N/A)  Replace abdominal binder - too large Hold Lovenox PT - ambulation Carb modified diet Transition to PO pain meds   LOS: 1 day    Maia Petties 04/03/2018

## 2018-04-04 ENCOUNTER — Encounter (HOSPITAL_COMMUNITY): Payer: Self-pay | Admitting: General Practice

## 2018-04-04 ENCOUNTER — Other Ambulatory Visit: Payer: Self-pay

## 2018-04-04 LAB — GLUCOSE, CAPILLARY
GLUCOSE-CAPILLARY: 45 mg/dL — AB (ref 70–99)
GLUCOSE-CAPILLARY: 69 mg/dL — AB (ref 70–99)
Glucose-Capillary: 115 mg/dL — ABNORMAL HIGH (ref 70–99)
Glucose-Capillary: 136 mg/dL — ABNORMAL HIGH (ref 70–99)
Glucose-Capillary: 114 mg/dL — ABNORMAL HIGH (ref 70–99)
Glucose-Capillary: 102 mg/dL — ABNORMAL HIGH (ref 70–99)

## 2018-04-04 MED ORDER — HYDROCODONE-ACETAMINOPHEN 5-325 MG PO TABS
1.0000 | ORAL_TABLET | ORAL | Status: DC | PRN
  Administered 2018-04-05: 2 via ORAL
  Filled 2018-04-04: qty 2

## 2018-04-04 MED ORDER — INFLUENZA VAC SPLIT HIGH-DOSE 0.5 ML IM SUSY
0.5000 mL | PREFILLED_SYRINGE | INTRAMUSCULAR | Status: AC
  Administered 2018-04-05: 0.5 mL via INTRAMUSCULAR
  Filled 2018-04-04: qty 0.5

## 2018-04-04 MED ORDER — ACETAMINOPHEN 325 MG PO TABS
650.0000 mg | ORAL_TABLET | Freq: Four times a day (QID) | ORAL | Status: DC | PRN
  Administered 2018-04-04: 650 mg via ORAL
  Filled 2018-04-04: qty 2

## 2018-04-04 MED ORDER — POLYETHYLENE GLYCOL 3350 17 G PO PACK
17.0000 g | PACK | Freq: Every day | ORAL | Status: DC
  Administered 2018-04-04: 17 g via ORAL
  Filled 2018-04-04 (×2): qty 1

## 2018-04-04 NOTE — Evaluation (Signed)
Physical Therapy Evaluation Patient Details Name: [name] MRN: [id number] DOB: 1951/04/14 Today's Date: 04/04/2018   History of Present Illness  Patient is a 67 y/o female who presents s/p hernia repair with mesh 10/21. PMH includes Rt THA, MI, HTN, HLD, DM, CKF, depression.  Clinical Impression  Patient presents close to functional baseline- able to ambulate community distances and negotiate steps with supervision for safety and use of RW. Reports minimal to no pain at this time. Encouraged use of RW for short period of time. Education re: log roll technique. Pt Mod I with SPC PTA and lives with sister. Encouraged ambulation while in the hospital. Pt does not require skilled therapy services. Discharge from therapy.    Follow Up Recommendations No PT follow up;Supervision - Intermittent    Equipment Recommendations  None recommended by PT    Recommendations for Other Services       Precautions / Restrictions Precautions Precautions: None Required Braces or Orthoses: Other Brace/Splint Other Brace/Splint: abdominal binder Restrictions Weight Bearing Restrictions: No      Mobility  Bed Mobility Overal bed mobility: Needs Assistance Bed Mobility: Rolling;Sidelying to Sit Rolling: Modified independent (Device/Increase time) Sidelying to sit: Modified independent (Device/Increase time)       General bed mobility comments: HOB flat to simulate home. Cues for log roll technique.  Transfers Overall transfer level: Needs assistance Equipment used: None Transfers: Sit to/from Stand Sit to Stand: Supervision         General transfer comment: Supervision for safety.   Ambulation/Gait Ambulation/Gait assistance: Supervision Gait Distance (Feet): 250 Feet Assistive device: Rolling walker (2 wheeled) Gait Pattern/deviations: Step-through pattern;Decreased stride length   Gait velocity interpretation: 1.31 - 2.62 ft/sec, indicative of limited community  ambulator General Gait Details: Slow, steady gait with RW for support. 2/4 DOE. Ambulates short distances without UE support.  Stairs Stairs: Yes Stairs assistance: Supervision Stair Management: Step to pattern;One rail Left Number of Stairs: 5 General stair comments: Cues for technique/safety.  Wheelchair Mobility    Modified Rankin (Stroke Patients Only)       Balance Overall balance assessment: No apparent balance deficits (not formally assessed)                                           Pertinent Vitals/Pain Pain Assessment: No/denies pain    Home Living Family/patient expects to be discharged to:: Private residence Living Arrangements: (sister) Available Help at Discharge: Family;Available 24 hours/day Type of Home: House Home Access: Stairs to enter Entrance Stairs-Rails: Right Entrance Stairs-Number of Steps: 5 Home Layout: One level Home Equipment: Walker - 2 wheels;Bedside commode;Cane - single point;Wheelchair - manual      Prior Function Level of Independence: Independent with assistive device(s)         Comments: uses SPC for community ambulation and no DME for household ambulation     Hand Dominance   Dominant Hand: Right    Extremity/Trunk Assessment   Upper Extremity Assessment Upper Extremity Assessment: Defer to OT evaluation    Lower Extremity Assessment Lower Extremity Assessment: Overall WFL for tasks assessed       Communication   Communication: No difficulties  Cognition Arousal/Alertness: Awake/alert Behavior During Therapy: WFL for tasks assessed/performed Overall Cognitive Status: Within Functional Limits for tasks assessed  General Comments      Exercises     Assessment/Plan    PT Assessment Patent does not need any further PT services  PT Problem List         PT Treatment Interventions      PT Goals (Current goals can be found in the  Care Plan section)  Acute Rehab PT Goals Patient Stated Goal: to go home tomorrow PT Goal Formulation: All assessment and education complete, DC therapy    Frequency     Barriers to discharge        Co-evaluation               AM-PAC PT "6 Clicks" Daily Activity  Outcome Measure Difficulty turning over in bed (including adjusting bedclothes, sheets and blankets)?: None Difficulty moving from lying on back to sitting on the side of the bed? : None Difficulty sitting down on and standing up from a chair with arms (e.g., wheelchair, bedside commode, etc,.)?: None Help needed moving to and from a bed to chair (including a wheelchair)?: None Help needed walking in hospital room?: None Help needed climbing 3-5 steps with a railing? : A Little 6 Click Score: 23    End of Session   Activity Tolerance: Patient tolerated treatment well Patient left: in bed;with call bell/phone within reach Nurse Communication: Mobility status PT Visit Diagnosis: Other abnormalities of gait and mobility (R26.89);Muscle weakness (generalized) (M62.81)    Time: 4585-9292 PT Time Calculation (min) (ACUTE ONLY): 15 min   Charges:   PT Evaluation $PT Eval Low Complexity: 1 Low          [name], PT, DPT Acute Rehabilitation Services Pager [phone] Office [phone]      [name] A [name] 04/04/2018, 1:34 PM

## 2018-04-04 NOTE — Progress Notes (Signed)
2 Days Post-Op   Subjective/Chief Complaint: Tolerating diet Pain better controlled No nausea or vomiting Less drainage from incision    Objective: Vital signs in last 24 hours: Temp:  [97.7 F (36.5 C)-98.7 F (37.1 C)] 97.7 F (36.5 C) (10/24 0536) Pulse Rate:  [79-84] 84 (10/24 0536) Resp:  [16-20] 20 (10/24 0536) BP: (143-170)/(83-87) 170/83 (10/24 0536) SpO2:  [94 %-99 %] 94 % (10/24 0536) Last BM Date: 04/01/18  Intake/Output from previous day: 10/23 0701 - 10/24 0700 In: 1175.2 [I.V.:1175.2] Out: -  Intake/Output this shift: No intake/output data recorded.  General appearance: alert, cooperative and no distress Resp: clear to auscultation bilaterally Cardio: regular rate and rhythm, S1, S2 normal, no murmur, click, rub or gallop GI: soft, midline incisional tenderness Incision/Wound: Minimal drainage on dressing - staple line intact Lab Results:  Recent Labs    04/02/18 0655 04/03/18 0159  WBC 8.7 9.8  HGB 10.6* 9.2*  HCT 35.5* 30.5*  PLT 291 280   BMET Recent Labs    04/02/18 0655 04/03/18 0159  NA 137 138  K 4.0 4.5  CL 104 106  CO2 20* 24  GLUCOSE 107* 132*  BUN 30* 21  CREATININE 1.31* 1.00  CALCIUM 9.8 9.3   PT/INR No results for input(s): LABPROT, INR in the last 72 hours. ABG No results for input(s): PHART, HCO3 in the last 72 hours.  Invalid input(s): PCO2, PO2  Studies/Results: No results found.  Anti-infectives: Anti-infectives (From admission, onward)   Start     Dose/Rate Route Frequency Ordered Stop   04/02/18 1630  ceFAZolin (ANCEF) IVPB 2g/100 mL premix     2 g 200 mL/hr over 30 Minutes Intravenous Every 8 hours 04/02/18 1201 04/02/18 1729   04/02/18 0645  ceFAZolin (ANCEF) IVPB 2g/100 mL premix     2 g 200 mL/hr over 30 Minutes Intravenous On call to O.R. 04/02/18 5176 04/02/18 0840      Assessment/Plan: s/p Procedure(s): OPEN VENTRAL HERNIA REPAIR WITH MESH, Explantation of hernia mesh (N/A) INSERTION OF MESH  (N/A) Miralax today to help with BM Saline lock IV PT - ambulate PO pain meds - the patient prefers Vicodin over Oxycodone  Probable discharge Friday.  LOS: 2 days    Maia Petties 04/04/2018

## 2018-04-04 NOTE — Progress Notes (Signed)
CRITICAL VALUE ALERT  Critical Value:  Glucose 45   Date & Time Notied:  04/04/18, 1736  Provider Notified: Dr. Kieth Brightly  Orders Received/Actions taken: Apple juice administered. Recheck in 15 minutes glucose 69.

## 2018-04-05 DIAGNOSIS — Z23 Encounter for immunization: Secondary | ICD-10-CM | POA: Diagnosis not present

## 2018-04-05 LAB — GLUCOSE, CAPILLARY
Glucose-Capillary: 152 mg/dL — ABNORMAL HIGH (ref 70–99)
Glucose-Capillary: 121 mg/dL — ABNORMAL HIGH (ref 70–99)

## 2018-04-05 MED ORDER — HYDROCODONE-ACETAMINOPHEN 5-325 MG PO TABS: 1.0000 | ORAL_TABLET | ORAL | 0 refills | Status: DC | PRN

## 2018-04-05 NOTE — Discharge Summary (Signed)
Physician Discharge Summary  Patient ID: [name] MRN: [id number] DOB/AGE: 66-18-1952 66 y.o.  Admit date: 04/02/2018 Discharge date: 04/05/2018  Admission Diagnoses:  Recurrent ventral incisional hernia    Diabetes mellitus  Discharge Diagnoses: same Active Problems:   Recurrent ventral incisional hernia   Discharged Condition: good  Hospital Course: Open repair of recurrent ventral incisional hernia on 04/02/18.  She had explantation of old mesh and repair of two large defects.  She had significant pain issues initially, but these slowly improved.  Physical therapy evaluated the patient and felt that she was safe for discharge home.  She began having bowel movements on 10/24.  Ready for discharge today.  Consults: Physical therapy  Treatments: surgery: Open ventral hernia repair with mesh  Discharge Exam: Blood pressure (!) 160/78, pulse 84, temperature 98.1 F (36.7 C), temperature source Oral, resp. rate 16, height 5\' 4"  (1.626 m), weight 80.3 kg, SpO2 99 %. General appearance: alert, cooperative and no distress GI: soft, midline incisional tenderness Staple line c/d/i  Disposition: Discharge disposition: 01-Home or Self Care       Discharge Instructions    Call MD for:  persistant nausea and vomiting   Complete by:  As directed    Call MD for:  redness, tenderness, or signs of infection (pain, swelling, redness, odor or green/yellow discharge around incision site)   Complete by:  As directed    Call MD for:  severe uncontrolled pain   Complete by:  As directed    Call MD for:  temperature >100.4   Complete by:  As directed    Diet general   Complete by:  As directed    Driving Restrictions   Complete by:  As directed    Do not drive while taking pain medications   Increase activity slowly   Complete by:  As directed    May shower / Bathe   Complete by:  As directed      Allergies as of 04/05/2018      Reactions   Other Itching, Nausea Only, Other  (See Comments)   Acidic foods cause stomach upset and itching   Aspirin Other (See Comments)   Can only take 81 - full strength will give acid reflux   Allopurinol Rash   Penicillins Rash   Has patient had a PCN reaction causing immediate rash, facial/tongue/throat swelling, SOB or lightheadedness with hypotension: unkn Has patient had a PCN reaction causing severe rash involving mucus membranes or skin necrosis: unkn Has patient had a PCN reaction that required hospitalization: unkn Has patient had a PCN reaction occurring within the last 10 years: unkn If all of the above answers are "NO", then may proceed with Cephalosporin use.   Sulfa Antibiotics Rash      Medication List    TAKE these medications   acetaminophen 500 MG tablet Commonly known as:  TYLENOL Take 1,000 mg by mouth every 8 (eight) hours as needed (pain).   albuterol 108 (90 Base) MCG/ACT inhaler Commonly known as:  PROVENTIL HFA;VENTOLIN HFA Inhale 1 puff into the lungs daily.   atorvastatin 20 MG tablet Commonly known as:  LIPITOR Take 20 mg by mouth daily.   citalopram 20 MG tablet Commonly known as:  CELEXA Take 20 mg by mouth daily.   cloNIDine 0.1 MG tablet Commonly known as:  CATAPRES Take 1 tablet (0.1 mg total) by mouth daily.   ergocalciferol 50000 units capsule Commonly known as:  VITAMIN D2 Take 50,000 Units by mouth See  admin instructions. Give 1 tablet by mouth on Tues. and Fri. once a day   fluticasone 50 MCG/ACT nasal spray Commonly known as:  FLONASE Place 1 spray into both nostrils 2 (two) times daily as needed for allergies or rhinitis.   furosemide 40 MG tablet Commonly known as:  LASIX Take 40 mg by mouth daily.   glucose blood test strip Test blood sugars twice a day   HYDROcodone-acetaminophen 5-325 MG tablet Commonly known as:  NORCO/VICODIN Take 1-2 tablets by mouth every 4 (four) hours as needed for moderate pain.   hydrOXYzine 10 MG tablet Commonly known as:   ATARAX/VISTARIL Take 5 mg by mouth 3 (three) times daily as needed for itching.   insulin glargine 100 UNIT/ML injection Commonly known as:  LANTUS Inject 0.25-0.3 mLs (25-30 Units total) into the skin daily.   insulin lispro 100 UNIT/ML injection Commonly known as:  HUMALOG Inject 10 Units into the skin See admin instructions. Only if BS is over 400   Insulin Syringe-Needle U-100 31G X 5/16" 0.5 ML Misc USE TO INJECT LANTUS AND NOVOLOG DAILY   levothyroxine 100 MCG tablet Commonly known as:  SYNTHROID, LEVOTHROID Take 1 tablet (100 mcg total) by mouth daily before breakfast.   Magnesium 400 MG Tabs Take 400 mg by mouth daily.   metFORMIN 500 MG tablet Commonly known as:  GLUCOPHAGE Take 1 tablet (500 mg total) by mouth 2 (two) times daily with a meal.   metoprolol succinate 25 MG 24 hr tablet Commonly known as:  TOPROL-XL Take 1 tablet (25 mg total) by mouth daily.   montelukast 10 MG tablet Commonly known as:  SINGULAIR TAKE 1 TABLET BY MOUTH ONCE DAILY IN THE EVENING   omeprazole 40 MG capsule Commonly known as:  PRILOSEC Take 1 capsule (40 mg total) by mouth daily.   PHILLIPS MILK OF MAGNESIA 400 MG/5ML suspension Generic drug:  magnesium hydroxide Take 105 mLs by mouth See admin instructions. Every 4 days - [name]   potassium chloride SA 20 MEQ tablet Commonly known as:  K-DUR,KLOR-CON Take 40 mEq by mouth daily.   quinapril 5 MG tablet Commonly known as:  ACCUPRIL Take 2 tablets (10 mg total) by mouth daily.   umeclidinium-vilanterol 62.5-25 MCG/INH Aepb Commonly known as:  ANORO ELLIPTA Inhale 1 puff into the lungs daily.      Follow-up Information    Donnie Mesa, MD. Schedule an appointment as soon as possible for a visit in 1 week(s).   Specialty:  General Surgery Contact information: [address] [place] [address] [phone]           Signed: Maia Petties 04/05/2018, 8:24 AM

## 2018-04-05 NOTE — Discharge Instructions (Signed)
[facility]      [facility] [phone]  OPEN ABDOMINAL SURGERY: POST OP INSTRUCTIONS  Always review your discharge instruction sheet given to you by the facility where your surgery was performed.  IF YOU HAVE DISABILITY OR FAMILY LEAVE FORMS, YOU MUST BRING THEM TO THE OFFICE FOR PROCESSING.  PLEASE DO NOT GIVE THEM TO YOUR DOCTOR.  1. A prescription for pain medication may be given to you upon discharge.  Take your pain medication as prescribed, if needed.  If narcotic pain medicine is not needed, then you may take acetaminophen (Tylenol) or ibuprofen (Advil) as needed. 2. Take your usually prescribed medications unless otherwise directed. 3. If you need a refill on your pain medication, please contact your pharmacy. They will contact our office to request authorization.  Prescriptions will not be filled after 5pm or on week-ends. 4. You should follow a light diet the first few days after arrival home, such as soup and crackers, pudding, etc.unless your doctor has advised otherwise. A high-fiber, low fat diet can be resumed as tolerated.   Be sure to include lots of fluids daily. Most patients will experience some swelling and bruising on the chest and neck area.  Ice packs will help.  Swelling and bruising can take several days to resolve 5. Most patients will experience some swelling and bruising in the area of the incision. Ice pack will help. Swelling and bruising can take several days to resolve..  6. It is common to experience some constipation if taking pain medication after surgery.  Increasing fluid intake and taking a stool softener will usually help or prevent this problem from occurring.  A mild laxative (Milk of Magnesia or Miralax) should be taken according to package directions if there are no bowel movements after 48 hours. 7.  You may have steri-strips (small skin tapes) in place directly over the incision.  These strips should be left on the skin for 7-10 days.  If your  surgeon used skin glue on the incision, you may shower in 24 hours.  The glue will flake off over the next 2-3 weeks.  Any sutures or staples will be removed at the office during your follow-up visit. You may find that a light gauze bandage over your incision may keep your staples from being rubbed or pulled. You may shower and replace the bandage daily. 8. ACTIVITIES:  You may resume regular (light) daily activities beginning the next day--such as daily self-care, walking, climbing stairs--gradually increasing activities as tolerated.  You may have sexual intercourse when it is comfortable.  Refrain from any heavy lifting or straining until approved by your doctor. a. You may drive when you no longer are taking prescription pain medication, you can comfortably wear a seatbelt, and you can safely maneuver your car and apply brakes b. Return to Work: ___________________________________ 9. You should see your doctor in the office for a follow-up appointment approximately two weeks after your surgery.  Make sure that you call for this appointment within a day or two after you arrive home to insure a convenient appointment time. OTHER INSTRUCTIONS:  _____________________________________________________________ _____________________________________________________________  WHEN TO CALL YOUR DOCTOR: 1. Fever over 101.0 2. Inability to urinate 3. Nausea and/or vomiting 4. Extreme swelling or bruising 5. Continued bleeding from incision. 6. Increased pain, redness, or drainage from the incision. 7. Difficulty swallowing or breathing 8. Muscle cramping or spasms. 9. Numbness or tingling in hands or feet or around lips.  The clinic staff is available to   answer your questions during regular business hours.  Please don't hesitate to call and ask to speak to one of the nurses if you have concerns.  For further questions, please visit [email or web address]   

## 2018-04-05 NOTE — Progress Notes (Signed)
Patient instructed on discharge papers, called family for pick up, took out iv, taken to pick up area in wheelchair.

## 2018-04-16 ENCOUNTER — Other Ambulatory Visit: Payer: Self-pay | Admitting: Family Medicine

## 2018-04-16 ENCOUNTER — Other Ambulatory Visit: Payer: Self-pay | Admitting: Physician Assistant

## 2018-04-16 DIAGNOSIS — E1065 Type 1 diabetes mellitus with hyperglycemia: Secondary | ICD-10-CM

## 2018-04-30 ENCOUNTER — Other Ambulatory Visit: Payer: Self-pay | Admitting: Physician Assistant

## 2018-05-01 ENCOUNTER — Ambulatory Visit (INDEPENDENT_AMBULATORY_CARE_PROVIDER_SITE_OTHER): Payer: Medicare Other | Admitting: Physician Assistant

## 2018-05-01 VITALS — BP 112/71 | HR 88 | Ht 64.0 in | Wt 173.4 lb

## 2018-05-01 DIAGNOSIS — Z23 Encounter for immunization: Secondary | ICD-10-CM | POA: Diagnosis not present

## 2018-05-01 DIAGNOSIS — E039 Hypothyroidism, unspecified: Secondary | ICD-10-CM | POA: Diagnosis not present

## 2018-05-01 DIAGNOSIS — E1065 Type 1 diabetes mellitus with hyperglycemia: Secondary | ICD-10-CM | POA: Diagnosis not present

## 2018-05-01 DIAGNOSIS — I10 Essential (primary) hypertension: Secondary | ICD-10-CM

## 2018-05-01 LAB — BAYER DCA HB A1C WAIVED: HB A1C (BAYER DCA - WAIVED): 6.6 % (ref <7.0)

## 2018-05-01 MED ORDER — CITALOPRAM HYDROBROMIDE 20 MG PO TABS: 20.0000 mg | ORAL_TABLET | Freq: Every day | ORAL | 3 refills | Status: DC

## 2018-05-01 MED ORDER — ATORVASTATIN CALCIUM 20 MG PO TABS: 20.0000 mg | ORAL_TABLET | Freq: Every day | ORAL | 5 refills | Status: DC

## 2018-05-01 MED ORDER — ERGOCALCIFEROL 1.25 MG (50000 UT) PO CAPS: 50000.0000 [IU] | ORAL_CAPSULE | ORAL | 3 refills | Status: DC

## 2018-05-02 LAB — CBC WITH DIFFERENTIAL/PLATELET
Basophils Absolute: 0 10*3/uL (ref 0.0–0.2)
Basos: 0 %
EOS (ABSOLUTE): 0.3 10*3/uL (ref 0.0–0.4)
Eos: 5 %
HEMATOCRIT: 33.2 % — AB (ref 34.0–46.6)
Hemoglobin: 10.9 g/dL — ABNORMAL LOW (ref 11.1–15.9)
IMMATURE GRANULOCYTES: 0 %
Immature Grans (Abs): 0 10*3/uL (ref 0.0–0.1)
LYMPHS ABS: 1.5 10*3/uL (ref 0.7–3.1)
Lymphs: 22 %
MCH: 25.2 pg — ABNORMAL LOW (ref 26.6–33.0)
MCHC: 32.8 g/dL (ref 31.5–35.7)
MCV: 77 fL — ABNORMAL LOW (ref 79–97)
MONOS ABS: 0.5 10*3/uL (ref 0.1–0.9)
Monocytes: 7 %
Neutrophils Absolute: 4.6 10*3/uL (ref 1.4–7.0)
Neutrophils: 66 %
PLATELETS: 267 10*3/uL (ref 150–450)
RBC: 4.32 x10E6/uL (ref 3.77–5.28)
RDW: 12.6 % (ref 12.3–15.4)
WBC: 7 10*3/uL (ref 3.4–10.8)

## 2018-05-02 LAB — CMP14+EGFR
ALK PHOS: 160 IU/L — AB (ref 39–117)
ALT: 10 IU/L (ref 0–32)
AST: 12 IU/L (ref 0–40)
Albumin/Globulin Ratio: 1.6 (ref 1.2–2.2)
Albumin: 4.4 g/dL (ref 3.6–4.8)
BUN/Creatinine Ratio: 21 (ref 12–28)
BUN: 23 mg/dL (ref 8–27)
Bilirubin Total: 0.2 mg/dL (ref 0.0–1.2)
CALCIUM: 10.5 mg/dL — AB (ref 8.7–10.3)
CO2: 23 mmol/L (ref 20–29)
Chloride: 102 mmol/L (ref 96–106)
Creatinine, Ser: 1.07 mg/dL — ABNORMAL HIGH (ref 0.57–1.00)
GFR, EST AFRICAN AMERICAN: 63 mL/min/{1.73_m2} (ref >59)
GFR, EST NON AFRICAN AMERICAN: 54 mL/min/{1.73_m2} — AB (ref >59)
GLOBULIN, TOTAL: 2.8 g/dL (ref 1.5–4.5)
Glucose: 109 mg/dL — ABNORMAL HIGH (ref 65–99)
Potassium: 5.2 mmol/L (ref 3.5–5.2)
SODIUM: 141 mmol/L (ref 134–144)
Total Protein: 7.2 g/dL (ref 6.0–8.5)

## 2018-05-02 LAB — MICROALBUMIN / CREATININE URINE RATIO
Creatinine, Urine: 109.5 mg/dL
MICROALB/CREAT RATIO: 8.2 mg/g{creat} (ref 0.0–30.0)
Microalbumin, Urine: 9 ug/mL

## 2018-05-03 ENCOUNTER — Encounter: Payer: Self-pay | Admitting: Physician Assistant

## 2018-05-03 NOTE — Progress Notes (Signed)
BP 112/71   Pulse 88   Ht _0  (1.626 m)   Wt 173 lb 6.4 oz (78.7 kg)   BMI 29.76 kg/m    Subjective:    Patient ID: [name], female    DOB: [birth year], 67 y.o.   MRN: [id number]  HPI: RICHELLE Bright is a 67 y.o. female presenting on 05/01/2018 for Diabetes (2 month follow up)  This patient comes in for periodic recheck on medications and conditions including diabetes, hypertension, hypothyroidism.  She reports that she is getting stronger and doing really well.   All medications are reviewed today. There are no reports of any problems with the medications. All of the medical conditions are reviewed and updated.  Lab work is reviewed and will be ordered as medically necessary. There are no new problems reported with today's visit.   Past Medical History:  Diagnosis Date  . Allergic rhinitis   . Allergy   . Anemia   . Ankle swelling   . Anxiety    takes Xanax daily   . Arthritis   . Asthma   . Blurred vision, bilateral   . Chronic kidney disease   . Depression    takes Celexa daily  . Diabetes mellitus without complication ([place])    type II; takes Actos daily  . GERD (gastroesophageal reflux disease)   . Gout   . Headache   . Heart murmur   . Heart palpitations   . Hemorrhoids   . History of blood transfusion    no abnormal reaction noted  . History of bronchitis   . Hyperlipidemia   . Hypertension    takes Catapres and Quinapril daily  . Hypothyroidism    takes Synthroid daily  . Moderate mitral insufficiency    a. 12/2014 Echo: EF 55-60%, mod MR, mildly to moderately dil LA, PASP 8mHg.  . Myocardial infarction ([place]    " Minor"  . Osteoarthritis   . Paroxysmal supraventricular tachycardia ([place]   . Recurrent ventral incisional hernia   . Ringing in ears   . Shortness of breath dyspnea    ALbuterol daily as needed  . Stevens-Johnson disease ([place]   . Urinary incontinence   . Wears glasses    Relevant past medical, surgical, family and social  history reviewed and updated as indicated. Interim medical history since our last visit reviewed. Allergies and medications reviewed and updated. DATA REVIEWED: CHART IN EPIC  Family History reviewed for pertinent findings.  Review of Systems  Constitutional: Negative.  Negative for activity change, fatigue and fever.  HENT: Negative.   Eyes: Negative.   Respiratory: Negative.  Negative for cough.   Cardiovascular: Negative.  Negative for chest pain.  Gastrointestinal: Negative.  Negative for abdominal pain.  Endocrine: Negative.   Genitourinary: Negative.  Negative for dysuria.  Musculoskeletal: Positive for arthralgias, back pain and myalgias.  Skin: Negative.   Neurological: Negative.     Allergies as of 05/01/2018      Reactions   Other Itching, Nausea Only, Other (See Comments)   Acidic foods cause stomach upset and itching   Aspirin Other (See Comments)   Can only take 81 - full strength will give acid reflux   Allopurinol Rash   Penicillins Rash   Has patient had a PCN reaction causing immediate rash, facial/tongue/throat swelling, SOB or lightheadedness with hypotension: unkn Has patient had a PCN reaction causing severe rash involving mucus membranes or skin necrosis: unkn Has patient had a PCN reaction that  required hospitalization: unkn Has patient had a PCN reaction occurring within the last 10 years: unkn If all of the above answers are "NO", then may proceed with Cephalosporin use.   Sulfa Antibiotics Rash      Medication List        Accurate as of 05/01/18 11:59 PM. Always use your most recent med list.          acetaminophen 500 MG tablet Commonly known as:  TYLENOL Take 1,000 mg by mouth every 8 (eight) hours as needed (pain).   albuterol 108 (90 Base) MCG/ACT inhaler Commonly known as:  PROVENTIL HFA;VENTOLIN HFA Inhale 1 puff into the lungs daily.   aspirin EC 81 MG tablet Take 81 mg by mouth daily.   atorvastatin 20 MG tablet Commonly known  as:  LIPITOR Take 1 tablet (20 mg total) by mouth daily.   citalopram 20 MG tablet Commonly known as:  CELEXA Take 1 tablet (20 mg total) by mouth daily.   cloNIDine 0.1 MG tablet Commonly known as:  CATAPRES Take 1 tablet (0.1 mg total) by mouth daily.   ergocalciferol 1.25 MG (50000 UT) capsule Commonly known as:  VITAMIN D2 Take 1 capsule (50,000 Units total) by mouth See admin instructions. Give 1 tablet by mouth on Tues. and Fri. once a day   fluticasone 50 MCG/ACT nasal spray Commonly known as:  FLONASE Place 1 spray into both nostrils 2 (two) times daily as needed for allergies or rhinitis.   furosemide 40 MG tablet Commonly known as:  LASIX Take 40 mg by mouth daily.   glucose blood test strip TEST TWICE A DAY   HUMALOG 100 UNIT/ML injection Generic drug:  insulin lispro INJECT 10UNITS SUBCUTANEOUSLY INTO SKIN DAILY   HYDROcodone-acetaminophen 5-325 MG tablet Commonly known as:  NORCO/VICODIN Take 1-2 tablets by mouth every 4 (four) hours as needed for moderate pain.   hydrOXYzine 10 MG tablet Commonly known as:  ATARAX/VISTARIL Take 5 mg by mouth 3 (three) times daily as needed for itching.   insulin glargine 100 UNIT/ML injection Commonly known as:  LANTUS Inject 0.25-0.3 mLs (25-30 Units total) into the skin daily.   Insulin Syringe-Needle U-100 31G X 5/16" 0.5 ML Misc USE TO INJECT LANTUS AND NOVOLOG DAILY   levothyroxine 100 MCG tablet Commonly known as:  SYNTHROID, LEVOTHROID Take 1 tablet (100 mcg total) by mouth daily before breakfast.   Magnesium 400 MG Tabs Take 400 mg by mouth daily.   metFORMIN 500 MG tablet Commonly known as:  GLUCOPHAGE Take 1 tablet (500 mg total) by mouth 2 (two) times daily with a meal.   metoprolol succinate 25 MG 24 hr tablet Commonly known as:  TOPROL-XL Take 1 tablet (25 mg total) by mouth daily.   montelukast 10 MG tablet Commonly known as:  SINGULAIR TAKE 1 TABLET BY MOUTH ONCE DAILY IN THE EVENING     omeprazole 40 MG capsule Commonly known as:  PRILOSEC Take 1 capsule (40 mg total) by mouth daily.   PHILLIPS MILK OF MAGNESIA 400 MG/5ML suspension Generic drug:  magnesium hydroxide Take 105 mLs by mouth See admin instructions. Every 4 days - [name]   potassium chloride SA 20 MEQ tablet Commonly known as:  K-DUR,KLOR-CON Take 40 mEq by mouth daily.   quinapril 5 MG tablet Commonly known as:  ACCUPRIL Take 2 tablets (10 mg total) by mouth daily.   umeclidinium-vilanterol 62.5-25 MCG/INH Aepb Commonly known as:  ANORO ELLIPTA Inhale 1 puff into the lungs daily.  Objective:    BP 112/71   Pulse 88   Ht _0  (1.626 m)   Wt 173 lb 6.4 oz (78.7 kg)   BMI 29.76 kg/m   Allergies  Allergen Reactions  . Other Itching, Nausea Only and Other (See Comments)    Acidic foods cause stomach upset and itching  . Aspirin Other (See Comments)    Can only take 81 - full strength will give acid reflux  . Allopurinol Rash  . Penicillins Rash    Has patient had a PCN reaction causing immediate rash, facial/tongue/throat swelling, SOB or lightheadedness with hypotension: unkn Has patient had a PCN reaction causing severe rash involving mucus membranes or skin necrosis: unkn Has patient had a PCN reaction that required hospitalization: unkn Has patient had a PCN reaction occurring within the last 10 years: unkn If all of the above answers are "NO", then may proceed with Cephalosporin use.   . Sulfa Antibiotics Rash    Wt Readings from Last 3 Encounters:  05/01/18 173 lb 6.4 oz (78.7 kg)  04/02/18 177 lb (80.3 kg)  02/27/18 182 lb 3.2 oz (82.6 kg)    Physical Exam  Constitutional: She is oriented to person, place, and time. She appears well-developed and well-nourished.  HENT:  Head: Normocephalic and atraumatic.  Eyes: Pupils are equal, round, and reactive to light. Conjunctivae and EOM are normal.  Cardiovascular: Normal rate, regular rhythm, normal heart sounds and  intact distal pulses.  Pulmonary/Chest: Effort normal and breath sounds normal.  Abdominal: Soft. Bowel sounds are normal.  Neurological: She is alert and oriented to person, place, and time. She has normal reflexes.  Skin: Skin is warm and dry. No rash noted.  Psychiatric: She has a normal mood and affect. Her behavior is normal. Judgment and thought content normal.    Results for orders placed or performed in visit on 05/01/18  CBC with Differential/Platelet  Result Value Ref Range   WBC 7.0 3.4 - 10.8 x10E3/uL   RBC 4.32 3.77 - 5.28 x10E6/uL   Hemoglobin 10.9 (L) 11.1 - 15.9 g/dL   Hematocrit 33.2 (L) 34.0 - 46.6 %   MCV 77 (L) 79 - 97 fL   MCH 25.2 (L) 26.6 - 33.0 pg   MCHC 32.8 31.5 - 35.7 g/dL   RDW 12.6 12.3 - 15.4 %   Platelets 267 150 - 450 x10E3/uL   Neutrophils 66 Not Estab. %   Lymphs 22 Not Estab. %   Monocytes 7 Not Estab. %   Eos 5 Not Estab. %   Basos 0 Not Estab. %   Neutrophils Absolute 4.6 1.4 - 7.0 x10E3/uL   Lymphocytes Absolute 1.5 0.7 - 3.1 x10E3/uL   Monocytes Absolute 0.5 0.1 - 0.9 x10E3/uL   EOS (ABSOLUTE) 0.3 0.0 - 0.4 x10E3/uL   Basophils Absolute 0.0 0.0 - 0.2 x10E3/uL   Immature Granulocytes 0 Not Estab. %   Immature Grans (Abs) 0.0 0.0 - 0.1 x10E3/uL  CMP14+EGFR  Result Value Ref Range   Glucose 109 (H) 65 - 99 mg/dL   BUN 23 8 - 27 mg/dL   Creatinine, Ser 1.07 (H) 0.57 - 1.00 mg/dL   GFR calc non Af Amer 54 (L) >59 mL/min/1.73   GFR calc Af Amer 63 >59 mL/min/1.73   BUN/Creatinine Ratio 21 12 - 28   Sodium 141 134 - 144 mmol/L   Potassium 5.2 3.5 - 5.2 mmol/L   Chloride 102 96 - 106 mmol/L   CO2 23 20 - 29 mmol/L  Calcium 10.5 (H) 8.7 - 10.3 mg/dL   Total Protein 7.2 6.0 - 8.5 g/dL   Albumin 4.4 3.6 - 4.8 g/dL   Globulin, Total 2.8 1.5 - 4.5 g/dL   Albumin/Globulin Ratio 1.6 1.2 - 2.2   Bilirubin Total 0.2 0.0 - 1.2 mg/dL   Alkaline Phosphatase 160 (H) 39 - 117 IU/L   AST 12 0 - 40 IU/L   ALT 10 0 - 32 IU/L  Microalbumin /  creatinine urine ratio  Result Value Ref Range   Creatinine, Urine 109.5 Not Estab. mg/dL   Microalbumin, Urine 9.0 Not Estab. ug/mL   Microalb/Creat Ratio 8.2 0.0 - 30.0 mg/g creat  Bayer DCA Hb A1c Waived  Result Value Ref Range   HB A1C (BAYER DCA - WAIVED) 6.6 <7.0 %      Assessment & Plan:   1. Hypothyroidism, unspecified type  2. Uncontrolled type 1 diabetes mellitus with hyperglycemia (HCC) - CBC with Differential/Platelet - CMP14+EGFR - Microalbumin / creatinine urine ratio - Bayer DCA Hb A1c Waived  3. Essential hypertension - CBC with Differential/Platelet - CMP14+EGFR - Microalbumin / creatinine urine ratio - Bayer DCA Hb A1c Waived   Continue all other maintenance medications as listed above.  Follow up plan: No follow-ups on file.  Educational handout given for Dustin [place] PA-C [place] [address]  [place], [place] [address] [phone]   05/03/2018, 5:32 PM

## 2018-05-13 ENCOUNTER — Ambulatory Visit: Payer: Medicare Other | Admitting: Cardiovascular Disease

## 2018-06-14 ENCOUNTER — Encounter: Payer: Self-pay | Admitting: Cardiovascular Disease

## 2018-06-14 ENCOUNTER — Encounter

## 2018-06-14 ENCOUNTER — Ambulatory Visit: Payer: Medicare Other | Admitting: Cardiovascular Disease

## 2018-06-14 VITALS — BP 137/73 | HR 75 | Ht 64.0 in | Wt 171.6 lb

## 2018-06-14 DIAGNOSIS — R002 Palpitations: Secondary | ICD-10-CM

## 2018-06-14 DIAGNOSIS — I10 Essential (primary) hypertension: Secondary | ICD-10-CM

## 2018-06-14 DIAGNOSIS — I340 Nonrheumatic mitral (valve) insufficiency: Secondary | ICD-10-CM

## 2018-06-14 DIAGNOSIS — I38 Endocarditis, valve unspecified: Secondary | ICD-10-CM | POA: Diagnosis not present

## 2018-06-14 DIAGNOSIS — K219 Gastro-esophageal reflux disease without esophagitis: Secondary | ICD-10-CM | POA: Diagnosis not present

## 2018-06-14 DIAGNOSIS — E785 Hyperlipidemia, unspecified: Secondary | ICD-10-CM

## 2018-06-14 MED ORDER — PANTOPRAZOLE SODIUM 40 MG PO TBEC: 40.0000 mg | DELAYED_RELEASE_TABLET | Freq: Every day | ORAL | 11 refills | Status: DC

## 2018-06-14 NOTE — Addendum Note (Signed)
Addended by: Laurine Blazer on: 06/14/2018 01:30 PM   Modules accepted: Orders

## 2018-06-14 NOTE — Progress Notes (Signed)
SUBJECTIVE: The patient presents for follow-up of valvular heart disease, hypertension, palpitations, and hyperlipidemia.  Echocardiogram on 01/26/2016 demonstrated normal left ventricular systolic function, LVEF 16-10%, grade 2 diastolic dysfunction with increased LV filling pressures, moderate mitral regurgitation, and mild to moderate tricuspid regurgitation.  She was reportedly hospitalized for diabetic ketoacidosis in late July/early August at [facility].  She said she is doing much better now and denies exertional chest pain and dyspnea.  She seldom has palpitations.  I personally reviewed the ECG performed on 04/02/2018 which demonstrated normal sinus rhythm with no significant ischemic abnormalities nor any arrhythmias.  She does have some chest discomfort when lying down in bed.  She has a hiatal hernia.    Review of Systems: As per "subjective", otherwise negative.  Allergies  Allergen Reactions  . Other Itching, Nausea Only and Other (See Comments)    Acidic foods cause stomach upset and itching  . Aspirin Other (See Comments)    Can only take 81 - full strength will give acid reflux  . Allopurinol Rash  . Penicillins Rash    Has patient had a PCN reaction causing immediate rash, facial/tongue/throat swelling, SOB or lightheadedness with hypotension: unkn Has patient had a PCN reaction causing severe rash involving mucus membranes or skin necrosis: unkn Has patient had a PCN reaction that required hospitalization: unkn Has patient had a PCN reaction occurring within the last 10 years: unkn If all of the above answers are "NO", then may proceed with Cephalosporin use.   . Sulfa Antibiotics Rash    Current Outpatient Medications  Medication Sig Dispense Refill  . acetaminophen (TYLENOL) 500 MG tablet Take 1,000 mg by mouth every 8 (eight) hours as needed (pain).     [name] albuterol (PROVENTIL HFA;VENTOLIN HFA) 108 (90 Base) MCG/ACT inhaler Inhale 1 puff into the  lungs daily.    [name] aspirin EC 81 MG tablet Take 81 mg by mouth daily.    [name] atorvastatin (LIPITOR) 20 MG tablet Take 1 tablet (20 mg total) by mouth daily. 30 tablet 5  . citalopram (CELEXA) 20 MG tablet Take 1 tablet (20 mg total) by mouth daily. 90 tablet 3  . cloNIDine (CATAPRES) 0.1 MG tablet Take 1 tablet (0.1 mg total) by mouth daily. 90 tablet 1  . ergocalciferol (VITAMIN D2) 1.25 MG (50000 UT) capsule Take 1 capsule (50,000 Units total) by mouth See admin instructions. Give 1 tablet by mouth on Tues. and Fri. once a day 12 capsule 3  . fluticasone (FLONASE) 50 MCG/ACT nasal spray Place 1 spray into both nostrils 2 (two) times daily as needed for allergies or rhinitis.    . furosemide (LASIX) 40 MG tablet Take 40 mg by mouth daily.    [name] glucose blood (ONE TOUCH ULTRA TEST) test strip TEST TWICE A DAY 200 each 3  . HUMALOG 100 UNIT/ML injection INJECT 10UNITS SUBCUTANEOUSLY INTO SKIN DAILY 10 mL 0  . HYDROcodone-acetaminophen (NORCO/VICODIN) 5-325 MG tablet Take 1-2 tablets by mouth every 4 (four) hours as needed for moderate pain. 24 tablet 0  . hydrOXYzine (ATARAX/VISTARIL) 10 MG tablet Take 5 mg by mouth 3 (three) times daily as needed for itching.     . insulin glargine (LANTUS) 100 UNIT/ML injection Inject 0.25-0.3 mLs (25-30 Units total) into the skin daily. 10 mL 11  . Insulin Syringe-Needle U-100 (BD INSULIN SYRINGE U/F) 31G X 5/16" 0.5 ML MISC USE TO INJECT LANTUS AND NOVOLOG DAILY 100 each 11  . levothyroxine (SYNTHROID, LEVOTHROID)  100 MCG tablet Take 1 tablet (100 mcg total) by mouth daily before breakfast. 90 tablet 0  . Magnesium 400 MG TABS Take 400 mg by mouth daily.    . magnesium hydroxide (PHILLIPS MILK OF MAGNESIA) 400 MG/5ML suspension Take 105 mLs by mouth See admin instructions. Every 4 days - [name]    . metFORMIN (GLUCOPHAGE) 500 MG tablet Take 1 tablet (500 mg total) by mouth 2 (two) times daily with a meal. 180 tablet 3  . metoprolol succinate (TOPROL XL) 25 MG 24  hr tablet Take 1 tablet (25 mg total) by mouth daily. 90 tablet 1  . montelukast (SINGULAIR) 10 MG tablet TAKE 1 TABLET BY MOUTH ONCE DAILY IN THE EVENING 90 tablet 3  . omeprazole (PRILOSEC) 40 MG capsule Take 1 capsule (40 mg total) by mouth daily. 90 capsule 3  . potassium chloride SA (K-DUR,KLOR-CON) 20 MEQ tablet Take 40 mEq by mouth daily.    . quinapril (ACCUPRIL) 5 MG tablet Take 2 tablets (10 mg total) by mouth daily. 90 tablet 3  . umeclidinium-vilanterol (ANORO ELLIPTA) 62.5-25 MCG/INH AEPB Inhale 1 puff into the lungs daily. 60 each 4   No current facility-administered medications for this visit.     Past Medical History:  Diagnosis Date  . Allergic rhinitis   . Allergy   . Anemia   . Ankle swelling   . Anxiety    takes Xanax daily   . Arthritis   . Asthma   . Blurred vision, bilateral   . Chronic kidney disease   . Depression    takes Celexa daily  . Diabetes mellitus without complication ([place])    type II; takes Actos daily  . GERD (gastroesophageal reflux disease)   . Gout   . Headache   . Heart murmur   . Heart palpitations   . Hemorrhoids   . History of blood transfusion    no abnormal reaction noted  . History of bronchitis   . Hyperlipidemia   . Hypertension    takes Catapres and Quinapril daily  . Hypothyroidism    takes Synthroid daily  . Moderate mitral insufficiency    a. 12/2014 Echo: EF 55-60%, mod MR, mildly to moderately dil LA, PASP 66mmHg.  . Myocardial infarction ([place])    " Minor"  . Osteoarthritis   . Paroxysmal supraventricular tachycardia ([place])   . Recurrent ventral incisional hernia   . Ringing in ears   . Shortness of breath dyspnea    ALbuterol daily as needed  . Stevens-Johnson disease ([place])   . Urinary incontinence   . Wears glasses     Past Surgical History:  Procedure Laterality Date  . ABDOMINAL HYSTERECTOMY    . BOWEL RESECTION    . COLON SURGERY    . COLONOSCOPY    . ESOPHAGOGASTRODUODENOSCOPY    . HERNIA REPAIR       hiatal hernia repair  . INCISIONAL HERNIA REPAIR N/A 04/02/2018   Procedure: OPEN VENTRAL HERNIA REPAIR WITH MESH, Explantation of hernia mesh;  Surgeon: Donnie Mesa, MD;  Location: [facility];  Service: General;  Laterality: N/A;  . INSERTION OF MESH N/A 04/02/2018   Procedure: INSERTION OF MESH;  Surgeon: Donnie Mesa, MD;  Location: [facility];  Service: General;  Laterality: N/A;  . TOTAL HIP ARTHROPLASTY Left 02/02/2015   Procedure: LEFT TOTAL HIP ARTHROPLASTY;  Surgeon: Garald Balding, MD;  Location: [facility];  Service: Orthopedics;  Laterality: Left;  . TOTAL HIP ARTHROPLASTY Right 02/01/2016  Procedure: TOTAL HIP ARTHROPLASTY;  Surgeon: Garald Balding, MD;  Location: [facility];  Service: Orthopedics;  Laterality: Right;    Social History   Socioeconomic History  . Marital status: Single    Spouse name: Not on file  . Number of children: Not on file  . Years of education: Not on file  . Highest education level: Not on file  Occupational History  . Not on file  Social Needs  . Financial resource strain: Not on file  . Food insecurity:    Worry: Not on file    Inability: Not on file  . Transportation needs:    Medical: Not on file    Non-medical: Not on file  Tobacco Use  . Smoking status: Never Smoker  . Smokeless tobacco: Never Used  Substance and Sexual Activity  . Alcohol use: No    Alcohol/week: 0.0 standard drinks  . Drug use: No  . Sexual activity: Not on file  Lifestyle  . Physical activity:    Days per week: Not on file    Minutes per session: Not on file  . Stress: Not on file  Relationships  . Social connections:    Talks on phone: Not on file    Gets together: Not on file    Attends religious service: Not on file    Active member of club or organization: Not on file    Attends meetings of clubs or organizations: Not on file    Relationship status: Not on file  . Intimate partner violence:    Fear of current or ex partner: Not on file    Emotionally  abused: Not on file    Physically abused: Not on file    Forced sexual activity: Not on file  Other Topics Concern  . Not on file  Social History Narrative  . Not on file     Vitals:   06/14/18 1309  BP: 137/73  Pulse: 75  SpO2: 99%  Weight: 171 lb 9.6 oz (77.8 kg)  Height: 5\' 4"  (1.626 m)    Wt Readings from Last 3 Encounters:  06/14/18 171 lb 9.6 oz (77.8 kg)  05/01/18 173 lb 6.4 oz (78.7 kg)  04/02/18 177 lb (80.3 kg)     PHYSICAL EXAM General: NAD HEENT: Normal. Neck: No JVD, no thyromegaly. Lungs: Clear to auscultation bilaterally with normal respiratory effort. CV: Regular rate and rhythm, normal S1/S2, no S3/S4, no murmur. No pretibial or periankle edema.  No carotid bruit.   Abdomen: Soft, nontender, no distention.  Neurologic: Alert and oriented.  Psych: Normal affect. Skin: Normal. Musculoskeletal: No gross deformities.    ECG: Reviewed above under Subjective   Labs: Lab Results  Component Value Date/Time   K 5.2 05/01/2018 11:02 AM   BUN 23 05/01/2018 11:02 AM   CREATININE 1.07 (H) 05/01/2018 11:02 AM   ALT 10 05/01/2018 11:02 AM   TSH 6.560 (H) 01/15/2018 03:15 AM   TSH 0.689 11/23/2017 10:51 AM   HGB 10.9 (L) 05/01/2018 11:02 AM     Lipids: Lab Results  Component Value Date/Time   LDLCALC 99 11/23/2017 10:51 AM   CHOL 159 11/23/2017 10:51 AM   TRIG 143 11/23/2017 10:51 AM   HDL 31 (L) 11/23/2017 10:51 AM       ASSESSMENT AND PLAN:  1.Valvular heart disease: Moderatemitral and mild to moderate tricuspid regurgitation noted on echocardiogram in August2017 as detailed above.    No murmurs on exam today.  Symptomatically stable.  2. Hypertension:  Controlled.  No changes to therapy.  3. Palpitations: Symptomatically stable on metoprolol succinate 25 mg every morning.  4. Hyperlipidemia: On Lipitor 20 mg. Lipids from 11/23/2017 reviewed above.  5.  GERD: She continues to experience symptoms and has a hiatal hernia.  She may need  to follow-up with GI for an upper endoscopy.  We talked about alternative proton pump inhibitors and she wishes to switch.  I will discontinue omeprazole and start Protonix 40 mg daily.    Disposition: Follow up 1 year.   Kate Sable, M.D., F.A.C.C.

## 2018-06-14 NOTE — Patient Instructions (Signed)
Medication Instructions:   Stop Prilosec.   Begin Protonix 40mg  daily.   Continue all other medications.    Labwork: none  Testing/Procedures: none  Follow-Up: Your physician wants you to follow up in:  1 year.  You will receive a reminder letter in the mail one-two months in advance.  If you don't receive a letter, please call our office to schedule the follow up appointment   Any Other Special Instructions Will Be Listed Below (If Applicable).  If you need a refill on your cardiac medications before your next appointment, please call your pharmacy.

## 2018-07-14 ENCOUNTER — Other Ambulatory Visit: Payer: Self-pay | Admitting: Physician Assistant

## 2018-07-29 ENCOUNTER — Other Ambulatory Visit: Payer: Self-pay | Admitting: Physician Assistant

## 2018-07-29 DIAGNOSIS — E039 Hypothyroidism, unspecified: Secondary | ICD-10-CM

## 2018-08-13 ENCOUNTER — Other Ambulatory Visit: Payer: Self-pay | Admitting: Physician Assistant

## 2018-08-27 ENCOUNTER — Other Ambulatory Visit: Payer: Self-pay

## 2018-08-27 ENCOUNTER — Other Ambulatory Visit: Payer: Self-pay | Admitting: *Deleted

## 2018-08-27 MED ORDER — FUROSEMIDE 40 MG PO TABS: 40.0000 mg | ORAL_TABLET | Freq: Every day | ORAL | 1 refills | Status: DC

## 2018-08-28 ENCOUNTER — Telehealth: Payer: Self-pay | Admitting: Physician Assistant

## 2018-08-29 ENCOUNTER — Other Ambulatory Visit: Payer: Self-pay | Admitting: Physician Assistant

## 2018-08-30 ENCOUNTER — Other Ambulatory Visit: Payer: Self-pay | Admitting: Physician Assistant

## 2018-09-12 ENCOUNTER — Telehealth: Payer: Self-pay | Admitting: Physician Assistant

## 2018-09-12 ENCOUNTER — Other Ambulatory Visit: Payer: Self-pay | Admitting: Physician Assistant

## 2018-09-12 MED ORDER — CITALOPRAM HYDROBROMIDE 20 MG PO TABS: 20.0000 mg | ORAL_TABLET | Freq: Every day | ORAL | 3 refills | Status: DC

## 2018-09-12 NOTE — Telephone Encounter (Signed)
Patient says 2 pills daily but med list says 1. Please review and advise

## 2018-09-12 NOTE — Telephone Encounter (Signed)
I sent a new script to he pharmacy for 2 daily of celexa 20 mg #180, 3 refills

## 2018-10-08 ENCOUNTER — Other Ambulatory Visit: Payer: Self-pay | Admitting: Physician Assistant

## 2018-10-23 ENCOUNTER — Other Ambulatory Visit: Payer: Self-pay | Admitting: Physician Assistant

## 2018-10-23 DIAGNOSIS — E039 Hypothyroidism, unspecified: Secondary | ICD-10-CM

## 2018-10-27 ENCOUNTER — Other Ambulatory Visit: Payer: Self-pay | Admitting: Physician Assistant

## 2018-10-29 ENCOUNTER — Other Ambulatory Visit: Payer: Self-pay

## 2018-10-30 ENCOUNTER — Encounter: Payer: Self-pay | Admitting: Physician Assistant

## 2018-10-30 ENCOUNTER — Ambulatory Visit (INDEPENDENT_AMBULATORY_CARE_PROVIDER_SITE_OTHER): Payer: Medicare Other | Admitting: Physician Assistant

## 2018-10-30 DIAGNOSIS — E1065 Type 1 diabetes mellitus with hyperglycemia: Secondary | ICD-10-CM

## 2018-10-30 DIAGNOSIS — Z1211 Encounter for screening for malignant neoplasm of colon: Secondary | ICD-10-CM | POA: Diagnosis not present

## 2018-10-30 DIAGNOSIS — F411 Generalized anxiety disorder: Secondary | ICD-10-CM

## 2018-10-30 DIAGNOSIS — I10 Essential (primary) hypertension: Secondary | ICD-10-CM

## 2018-10-30 DIAGNOSIS — E039 Hypothyroidism, unspecified: Secondary | ICD-10-CM | POA: Diagnosis not present

## 2018-10-30 MED ORDER — MONTELUKAST SODIUM 10 MG PO TABS: ORAL_TABLET | ORAL | 3 refills | Status: DC

## 2018-10-30 MED ORDER — METFORMIN HCL 500 MG PO TABS: 500.0000 mg | ORAL_TABLET | Freq: Two times a day (BID) | ORAL | 3 refills | Status: DC

## 2018-10-30 MED ORDER — ATORVASTATIN CALCIUM 20 MG PO TABS: 20.0000 mg | ORAL_TABLET | Freq: Every day | ORAL | 5 refills | Status: DC

## 2018-10-30 MED ORDER — FUROSEMIDE 40 MG PO TABS: 40.0000 mg | ORAL_TABLET | Freq: Every day | ORAL | 1 refills | Status: DC

## 2018-10-30 MED ORDER — LEVOTHYROXINE SODIUM 100 MCG PO TABS: 100.0000 ug | ORAL_TABLET | Freq: Every day | ORAL | 0 refills | Status: DC

## 2018-10-30 MED ORDER — METOPROLOL SUCCINATE ER 25 MG PO TB24: 25.0000 mg | ORAL_TABLET | Freq: Every day | ORAL | 1 refills | Status: DC

## 2018-10-30 MED ORDER — INSULIN GLARGINE 100 UNIT/ML ~~LOC~~ SOLN: SUBCUTANEOUS | 5 refills | Status: DC

## 2018-10-30 MED ORDER — UMECLIDINIUM-VILANTEROL 62.5-25 MCG/INH IN AEPB: 1.0000 | INHALATION_SPRAY | Freq: Every day | RESPIRATORY_TRACT | 4 refills | Status: DC

## 2018-10-30 MED ORDER — HYDROXYZINE HCL 25 MG PO TABS: 25.0000 mg | ORAL_TABLET | Freq: Three times a day (TID) | ORAL | 5 refills | Status: DC

## 2018-10-30 MED ORDER — QUINAPRIL HCL 5 MG PO TABS: 10.0000 mg | ORAL_TABLET | Freq: Every day | ORAL | 1 refills | Status: DC

## 2018-10-30 MED ORDER — CLONIDINE HCL 0.1 MG PO TABS: 0.1000 mg | ORAL_TABLET | Freq: Every day | ORAL | 3 refills | Status: DC

## 2018-10-30 NOTE — Progress Notes (Signed)
Telephone visit  Subjective: CC: Chronic recheck on her medical conditions PCP: Terald Sleeper, PA-C [name] is a 68 y.o. female calls for telephone consult today. Patient provides verbal consent for consult held via phone.  Patient is identified with 2 separate identifiers.  At this time the entire area is on COVID-19 social distancing and stay home orders are in place.  Patient is of higher risk and therefore we are performing this by a virtual method.  Location of patient: Home Location of provider: [facility] Others present for call: No  This patient is having a phone visit for follow-up on her chronic medical conditions.  She does have type 1 diabetes that is improving with her regimen.  She has had very good readings at home.  We will plan for her to come in for labs in the coming weeks.  Order has been placed.  She also has hypothyroidism, hypertension.  She states that she has had good readings most recently blood pressures are running in the 120s to 130s over 80s.  Her pulse is very good and her weight is stable  She is having a lot of trouble sleeping at night.  We do not want to do an anxiolytic however she does take hydroxyzine because of allergies and hives and so we are going to have her take hydroxyzine 25 mg at bedtime which is higher than her 10 mg dose that she normally would take during the day.  She will let us know if this does not help with her sleep.  She has tried melatonin and had no improvement  She does need a screening colonoscopy.  She states it has been 10 years since she had one performed and she would like to get one done at [facility].   ROS: Per HPI  Allergies  Allergen Reactions  . Other Itching, Nausea Only and Other (See Comments)    Acidic foods cause stomach upset and itching  . Aspirin Other (See Comments)    Can only take 81 - full strength will give acid reflux  . Allopurinol Rash  . Penicillins Rash    Has patient had a PCN  reaction causing immediate rash, facial/tongue/throat swelling, SOB or lightheadedness with hypotension: unkn Has patient had a PCN reaction causing severe rash involving mucus membranes or skin necrosis: unkn Has patient had a PCN reaction that required hospitalization: unkn Has patient had a PCN reaction occurring within the last 10 years: unkn If all of the above answers are "NO", then may proceed with Cephalosporin use.   . Sulfa Antibiotics Rash   Past Medical History:  Diagnosis Date  . Allergic rhinitis   . Allergy   . Anemia   . Ankle swelling   . Anxiety    takes Xanax daily   . Arthritis   . Asthma   . Blurred vision, bilateral   . Chronic kidney disease   . Depression    takes Celexa daily  . Diabetes mellitus without complication ([place])    type II; takes Actos daily  . GERD (gastroesophageal reflux disease)   . Gout   . Headache   . Heart murmur   . Heart palpitations   . Hemorrhoids   . History of blood transfusion    no abnormal reaction noted  . History of bronchitis   . Hyperlipidemia   . Hypertension    takes Catapres and Quinapril daily  . Hypothyroidism    takes Synthroid daily  .  Moderate mitral insufficiency    a. 12/2014 Echo: EF 55-60%, mod MR, mildly to moderately dil LA, PASP 47mHg.  . Myocardial infarction ([place]    " Minor"  . Osteoarthritis   . Paroxysmal supraventricular tachycardia ([place]   . Recurrent ventral incisional hernia   . Ringing in ears   . Shortness of breath dyspnea    ALbuterol daily as needed  . Stevens-Johnson disease ([place]   . Urinary incontinence   . Wears glasses     Current Outpatient Medications:  .  acetaminophen (TYLENOL) 500 MG tablet, Take 1,000 mg by mouth every 8 (eight) hours as needed (pain). , Disp: , Rfl:  .  albuterol (PROVENTIL HFA;VENTOLIN HFA) 108 (90 Base) MCG/ACT inhaler, Inhale 1 puff into the lungs daily., Disp: , Rfl:  .  aspirin EC 81 MG tablet, Take 81 mg by mouth daily., Disp: , Rfl:  .   atorvastatin (LIPITOR) 20 MG tablet, Take 1 tablet (20 mg total) by mouth daily., Disp: 30 tablet, Rfl: 5 .  citalopram (CELEXA) 20 MG tablet, Take 1 tablet (20 mg total) by mouth daily., Disp: 180 tablet, Rfl: 3 .  cloNIDine (CATAPRES) 0.1 MG tablet, Take 1 tablet (0.1 mg total) by mouth daily., Disp: 90 tablet, Rfl: 3 .  ergocalciferol (VITAMIN D2) 1.25 MG (50000 UT) capsule, Take 1 capsule (50,000 Units total) by mouth See admin instructions. Give 1 tablet by mouth on Tues. and Fri. once a day, Disp: 12 capsule, Rfl: 3 .  fluticasone (FLONASE) 50 MCG/ACT nasal spray, Place 1 spray into both nostrils 2 (two) times daily as needed for allergies or rhinitis., Disp: , Rfl:  .  furosemide (LASIX) 40 MG tablet, Take 1 tablet (40 mg total) by mouth daily., Disp: 90 tablet, Rfl: 1 .  glucose blood (ONE TOUCH ULTRA TEST) test strip, TEST TWICE A DAY, Disp: 100 each, Rfl: 12 .  HUMALOG 100 UNIT/ML injection, INJECT 10UNITS SUBCUTANEOUSLY INTO SKIN DAILY, Disp: 10 mL, Rfl: 0 .  HYDROcodone-acetaminophen (NORCO/VICODIN) 5-325 MG tablet, Take 1-2 tablets by mouth every 4 (four) hours as needed for moderate pain., Disp: 24 tablet, Rfl: 0 .  hydrOXYzine (ATARAX/VISTARIL) 25 MG tablet, Take 1 tablet (25 mg total) by mouth 3 (three) times daily. Take one at bedtime, Disp: 60 tablet, Rfl: 5 .  insulin glargine (LANTUS) 100 UNIT/ML injection, INJECT 25 UNITS TOTAL INTO THE SKIN AT BEDTIME., Disp: 10 mL, Rfl: 5 .  Insulin Syringe-Needle U-100 (BD INSULIN SYRINGE U/F) 31G X 5/16" 0.5 ML MISC, USE TO INJECT LANTUS AND NOVOLOG DAILY, Disp: 100 each, Rfl: 11 .  levothyroxine (SYNTHROID) 100 MCG tablet, Take 1 tablet (100 mcg total) by mouth daily before breakfast. (Needs to be seen before next refill), Disp: 30 tablet, Rfl: 0 .  Magnesium 400 MG TABS, Take 400 mg by mouth daily., Disp: , Rfl:  .  magnesium hydroxide (PHILLIPS MILK OF MAGNESIA) 400 MG/5ML suspension, Take 105 mLs by mouth See admin instructions. Every 4  days - [name], Disp: , Rfl:  .  metFORMIN (GLUCOPHAGE) 500 MG tablet, Take 1 tablet (500 mg total) by mouth 2 (two) times daily with a meal., Disp: 180 tablet, Rfl: 3 .  metoprolol succinate (TOPROL XL) 25 MG 24 hr tablet, Take 1 tablet (25 mg total) by mouth daily., Disp: 90 tablet, Rfl: 1 .  montelukast (SINGULAIR) 10 MG tablet, TAKE 1 TABLET BY MOUTH ONCE DAILY IN THE EVENING, Disp: 90 tablet, Rfl: 3 .  pantoprazole (PROTONIX) 40 MG tablet, Take  1 tablet (40 mg total) by mouth daily., Disp: 30 tablet, Rfl: 11 .  potassium chloride SA (K-DUR,KLOR-CON) 20 MEQ tablet, Take 40 mEq by mouth daily., Disp: , Rfl:  .  quinapril (ACCUPRIL) 5 MG tablet, Take 2 tablets (10 mg total) by mouth daily., Disp: 90 tablet, Rfl: 1 .  umeclidinium-vilanterol (ANORO ELLIPTA) 62.5-25 MCG/INH AEPB, Inhale 1 puff into the lungs daily., Disp: 60 each, Rfl: 4  Assessment/ Plan: 68 y.o. female   1. Uncontrolled type 1 diabetes mellitus with hyperglycemia (HCC) - insulin glargine (LANTUS) 100 UNIT/ML injection; INJECT 25 UNITS TOTAL INTO THE SKIN AT BEDTIME.  Dispense: 10 mL; Refill: 5 - metFORMIN (GLUCOPHAGE) 500 MG tablet; Take 1 tablet (500 mg total) by mouth 2 (two) times daily with a meal.  Dispense: 180 tablet; Refill: 3 - CBC with Differential/Platelet; Future - CMP14+EGFR; Future - Lipid panel; Future - TSH; Future - Bayer DCA Hb A1c Waived; Future - Microalbumin / creatinine urine ratio; Future  2. Hypothyroidism, unspecified type - levothyroxine (SYNTHROID) 100 MCG tablet; Take 1 tablet (100 mcg total) by mouth daily before breakfast. (Needs to be seen before next refill)  Dispense: 30 tablet; Refill: 0 - TSH; Future  3. Essential hypertension - cloNIDine (CATAPRES) 0.1 MG tablet; Take 1 tablet (0.1 mg total) by mouth daily.  Dispense: 90 tablet; Refill: 3 - furosemide (LASIX) 40 MG tablet; Take 1 tablet (40 mg total) by mouth daily.  Dispense: 90 tablet; Refill: 1 - metoprolol succinate (TOPROL XL)  25 MG 24 hr tablet; Take 1 tablet (25 mg total) by mouth daily.  Dispense: 90 tablet; Refill: 1 - CBC with Differential/Platelet; Future - CMP14+EGFR; Future - Lipid panel; Future - TSH; Future - Bayer DCA Hb A1c Waived; Future - Microalbumin / creatinine urine ratio; Future  4. Generalized anxiety disorder - hydrOXYzine (ATARAX/VISTARIL) 25 MG tablet; Take 1 tablet (25 mg total) by mouth 3 (three) times daily. Take one at bedtime  Dispense: 60 tablet; Refill: 5   5. Screening for colon cancer - Ambulatory referral to Gastroenterology   Start time: 9:58 AM End time: 10:14 AM  Meds ordered this encounter  Medications  . DISCONTD: atorvastatin (LIPITOR) 20 MG tablet    Sig: Take 1 tablet (20 mg total) by mouth daily.    Dispense:  30 tablet    Refill:  5    Order Specific Question:   Supervising Provider    Answer:   Janora Norlander [[id number]]  . cloNIDine (CATAPRES) 0.1 MG tablet    Sig: Take 1 tablet (0.1 mg total) by mouth daily.    Dispense:  90 tablet    Refill:  3    Order Specific Question:   Supervising Provider    Answer:   Janora Norlander [[id number]]  . furosemide (LASIX) 40 MG tablet    Sig: Take 1 tablet (40 mg total) by mouth daily.    Dispense:  90 tablet    Refill:  1    Order Specific Question:   Supervising Provider    Answer:   Janora Norlander [[id number]]  . insulin glargine (LANTUS) 100 UNIT/ML injection    Sig: INJECT 25 UNITS TOTAL INTO THE SKIN AT BEDTIME.    Dispense:  10 mL    Refill:  5    Order Specific Question:   Supervising Provider    Answer:   Janora Norlander [[id number]]  . levothyroxine (SYNTHROID) 100 MCG tablet    Sig: Take 1 tablet (100 mcg total)  by mouth daily before breakfast. (Needs to be seen before next refill)    Dispense:  30 tablet    Refill:  0    Order Specific Question:   Supervising Provider    Answer:   Janora Norlander [[id number]]  . metFORMIN (GLUCOPHAGE) 500 MG tablet    Sig: Take 1 tablet (500 mg total)  by mouth 2 (two) times daily with a meal.    Dispense:  180 tablet    Refill:  3    Order Specific Question:   Supervising Provider    Answer:   Janora Norlander [[id number]]  . metoprolol succinate (TOPROL XL) 25 MG 24 hr tablet    Sig: Take 1 tablet (25 mg total) by mouth daily.    Dispense:  90 tablet    Refill:  1    Order Specific Question:   Supervising Provider    Answer:   Janora Norlander [[id number]]  . montelukast (SINGULAIR) 10 MG tablet    Sig: TAKE 1 TABLET BY MOUTH ONCE DAILY IN THE EVENING    Dispense:  90 tablet    Refill:  3    Order Specific Question:   Supervising Provider    Answer:   Janora Norlander [[id number]]  . quinapril (ACCUPRIL) 5 MG tablet    Sig: Take 2 tablets (10 mg total) by mouth daily.    Dispense:  90 tablet    Refill:  1    Order Specific Question:   Supervising Provider    Answer:   Janora Norlander [[id number]]  . umeclidinium-vilanterol (ANORO ELLIPTA) 62.5-25 MCG/INH AEPB    Sig: Inhale 1 puff into the lungs daily.    Dispense:  60 each    Refill:  4    Order Specific Question:   Supervising Provider    Answer:   Janora Norlander [[id number]]  . hydrOXYzine (ATARAX/VISTARIL) 25 MG tablet    Sig: Take 1 tablet (25 mg total) by mouth 3 (three) times daily. Take one at bedtime    Dispense:  60 tablet    Refill:  5    Order Specific Question:   Supervising Provider    Answer:   Janora Norlander [[id number]]  . atorvastatin (LIPITOR) 20 MG tablet    Sig: Take 1 tablet (20 mg total) by mouth daily.    Dispense:  30 tablet    Refill:  5    Order Specific Question:   Supervising Provider    Answer:   Janora Norlander [[id number]]    Particia Nearing PA-C [place] [phone]

## 2018-10-31 ENCOUNTER — Other Ambulatory Visit: Payer: Self-pay | Admitting: Family Medicine

## 2018-10-31 ENCOUNTER — Telehealth: Payer: Self-pay | Admitting: Physician Assistant

## 2018-10-31 DIAGNOSIS — E1065 Type 1 diabetes mellitus with hyperglycemia: Secondary | ICD-10-CM

## 2018-10-31 NOTE — Telephone Encounter (Signed)
Expiration date discussed on bottle of insulin.

## 2018-11-13 ENCOUNTER — Other Ambulatory Visit: Payer: Self-pay | Admitting: Physician Assistant

## 2018-11-18 ENCOUNTER — Encounter: Payer: Self-pay | Admitting: Gastroenterology

## 2018-11-18 ENCOUNTER — Other Ambulatory Visit: Payer: Self-pay | Admitting: Internal Medicine

## 2018-11-19 ENCOUNTER — Ambulatory Visit (INDEPENDENT_AMBULATORY_CARE_PROVIDER_SITE_OTHER): Payer: Medicare Other | Admitting: *Deleted

## 2018-11-19 ENCOUNTER — Other Ambulatory Visit: Payer: Self-pay

## 2018-11-19 DIAGNOSIS — Z0000 Encounter for general adult medical examination without abnormal findings: Secondary | ICD-10-CM

## 2018-11-19 NOTE — Progress Notes (Signed)
MEDICARE ANNUAL WELLNESS VISIT  11/19/2018  Telephone Visit Disclaimer This Medicare AWV was conducted by telephone due to national recommendations for restrictions regarding the COVID-19 Pandemic (e.g. social distancing).  I verified, using two identifiers, that I am speaking with [name] or their authorized healthcare agent. I discussed the limitations, risks, security, and privacy concerns of performing an evaluation and management service by telephone and the potential availability of an in-person appointment in the future. The patient expressed understanding and agreed to proceed.   Subjective:  [name] is a 68 y.o. female patient of Terald Sleeper, PA-C who had a Medicare Annual Wellness Visit today via telephone. [name] is Retired and lives with their family. she has 0 children. she reports that she is socially active and does interact with friends/family regularly. she is not physically active and enjoys reading the bible and word puzzles.  Patient Care Team: Theodoro Clock as PCP - General (General Practice) Herminio Commons, MD as PCP - Cardiology (Cardiology) Garald Balding, MD as Consulting Physician (Orthopedic Surgery) Danie Binder, MD as Consulting Physician (Gastroenterology)  Advanced Directives 11/19/2018 04/02/2018 01/21/2018 01/18/2018 01/17/2018 01/14/2018 01/21/2016  Does Patient Have a Medical Advance Directive? No No Yes Yes Yes Yes Yes  Type of Advance Directive - - (No Data) (No Data) (No Data) (No Data) Living will  Does patient want to make changes to medical advance directive? - - No - Patient declined No - Patient declined No - Patient declined No - Patient declined No - Patient declined  Copy of [place] in Chart? - - - - - - No - copy requested  Would patient like information on creating a medical advance directive? No - Patient declined No - Patient declined - - - - [facility] Utilization Over the Past 12 Months:  # of hospitalizations or ER visits: 1 # of surgeries: 1  Review of Systems    Patient reports that her overall health is better compared to last year.  Patient Reported Readings (BP, Pulse, CBG, Weight, etc) none  Review of Systems: No complaints  All other systems negative.  Pain Assessment Pain : No/denies pain     Current Medications & Allergies (verified) Allergies as of 11/19/2018      Reactions   Other Itching, Nausea Only, Other (See Comments)   Acidic foods cause stomach upset and itching   Aspirin Other (See Comments)   Can only take 81 - full strength will give acid reflux   Allopurinol Rash   Penicillins Rash   Has patient had a PCN reaction causing immediate rash, facial/tongue/throat swelling, SOB or lightheadedness with hypotension: unkn Has patient had a PCN reaction causing severe rash involving mucus membranes or skin necrosis: unkn Has patient had a PCN reaction that required hospitalization: unkn Has patient had a PCN reaction occurring within the last 10 years: unkn If all of the above answers are "NO", then may proceed with Cephalosporin use.   Sulfa Antibiotics Rash      Medication List       Accurate as of November 19, 2018  2:11 PM. If you have any questions, ask your nurse or doctor.        STOP taking these medications   aspirin EC 81 MG tablet   HYDROcodone-acetaminophen 5-325 MG tablet Commonly known as:  NORCO/VICODIN     TAKE these medications   acetaminophen 500 MG tablet Commonly known as:  TYLENOL Take 1,000 mg by mouth every 8 (eight) hours as needed (pain).   albuterol 108 (90 Base) MCG/ACT inhaler Commonly known as:  VENTOLIN HFA Inhale 1 puff into the lungs daily.   atorvastatin 20 MG tablet Commonly known as:  LIPITOR Take 1 tablet (20 mg total) by mouth daily.   citalopram 20 MG tablet Commonly known as:  CELEXA Take 1 tablet (20 mg total) by mouth daily.   cloNIDine 0.1 MG tablet Commonly known as:  CATAPRES Take 1  tablet (0.1 mg total) by mouth daily.   ergocalciferol 1.25 MG (50000 UT) capsule Commonly known as:  VITAMIN D2 Take 1 capsule (50,000 Units total) by mouth See admin instructions. Give 1 tablet by mouth on Tues. and Fri. once a day   fluticasone 50 MCG/ACT nasal spray Commonly known as:  FLONASE Place 1 spray into both nostrils 2 (two) times daily as needed for allergies or rhinitis.   furosemide 40 MG tablet Commonly known as:  LASIX Take 1 tablet (40 mg total) by mouth daily.   glucose blood test strip Commonly known as:  ONE TOUCH ULTRA TEST TEST TWICE A DAY   HumaLOG 100 UNIT/ML injection Generic drug:  insulin lispro INJECT 10UNITS SUBCUTANEOUSLY INTO SKIN DAILY   hydrOXYzine 25 MG tablet Commonly known as:  ATARAX/VISTARIL Take 1 tablet (25 mg total) by mouth 3 (three) times daily. Take one at bedtime   insulin glargine 100 UNIT/ML injection Commonly known as:  Lantus INJECT 25 UNITS TOTAL INTO THE SKIN AT BEDTIME.   Insulin Syringe-Needle U-100 31G X 5/16" 0.5 ML Misc Commonly known as:  BD Insulin Syringe U/F USE TO INJECT LANTUS AND NOVOLOG DAILY   Klor-Con M20 20 MEQ tablet Generic drug:  potassium chloride SA TAKE 2 TABLETS BY MOUTH IN IN THE MORNING WITH FOOD & A FULL GLASS OF LIQUID. DO NOT CRUSH   levothyroxine 100 MCG tablet Commonly known as:  SYNTHROID Take 1 tablet (100 mcg total) by mouth daily before breakfast. (Needs to be seen before next refill)   Magnesium 400 MG Tabs Take 400 mg by mouth daily.   metFORMIN 500 MG tablet Commonly known as:  GLUCOPHAGE Take 1 tablet (500 mg total) by mouth 2 (two) times daily with a meal.   metoprolol succinate 25 MG 24 hr tablet Commonly known as:  Toprol XL Take 1 tablet (25 mg total) by mouth daily.   montelukast 10 MG tablet Commonly known as:  SINGULAIR TAKE 1 TABLET BY MOUTH ONCE DAILY IN THE EVENING   pantoprazole 40 MG tablet Commonly known as:  PROTONIX Take 1 tablet (40 mg total) by mouth  daily.   Phillips Milk of Magnesia 400 MG/5ML suspension Generic drug:  magnesium hydroxide Take 105 mLs by mouth See admin instructions. Every 4 days - [name]   quinapril 5 MG tablet Commonly known as:  ACCUPRIL Take 2 tablets (10 mg total) by mouth daily.   umeclidinium-vilanterol 62.5-25 MCG/INH Aepb Commonly known as:  Anoro Ellipta Inhale 1 puff into the lungs daily.       History (reviewed): Past Medical History:  Diagnosis Date  . Allergic rhinitis   . Allergy   . Anemia   . Ankle swelling   . Anxiety    takes Xanax daily   . Arthritis   . Asthma   . Blurred vision, bilateral   . Chronic kidney disease   . Depression    takes Celexa daily  . Diabetes mellitus without complication ([place])  type II; takes Actos daily  . GERD (gastroesophageal reflux disease)   . Gout   . Headache   . Heart murmur   . Heart palpitations   . Hemorrhoids   . History of blood transfusion    no abnormal reaction noted  . History of bronchitis   . Hyperlipidemia   . Hypertension    takes Catapres and Quinapril daily  . Hypothyroidism    takes Synthroid daily  . Moderate mitral insufficiency    a. 12/2014 Echo: EF 55-60%, mod MR, mildly to moderately dil LA, PASP 76mmHg.  . Myocardial infarction ([place])    " Minor"  . Osteoarthritis   . Paroxysmal supraventricular tachycardia ([place])   . Recurrent ventral incisional hernia   . Ringing in ears   . Shortness of breath dyspnea    ALbuterol daily as needed  . Stevens-Johnson disease ([place])   . Urinary incontinence   . Wears glasses    Past Surgical History:  Procedure Laterality Date  . ABDOMINAL HYSTERECTOMY    . BOWEL RESECTION    . COLON SURGERY    . COLONOSCOPY    . ESOPHAGOGASTRODUODENOSCOPY    . [place]  . HERNIA REPAIR     hiatal hernia repair  . [place]   pt is unsure of exact date but thinks it was early 12's  . INCISIONAL HERNIA REPAIR N/A 04/02/2018   Procedure: OPEN  VENTRAL HERNIA REPAIR WITH MESH, Explantation of hernia mesh;  Surgeon: Donnie Mesa, MD;  Location: [facility];  Service: General;  Laterality: N/A;  . INSERTION OF MESH N/A 04/02/2018   Procedure: INSERTION OF MESH;  Surgeon: Donnie Mesa, MD;  Location: [facility];  Service: General;  Laterality: N/A;  . TOTAL HIP ARTHROPLASTY Left 02/02/2015   Procedure: LEFT TOTAL HIP ARTHROPLASTY;  Surgeon: Garald Balding, MD;  Location: [facility];  Service: Orthopedics;  Laterality: Left;  . TOTAL HIP ARTHROPLASTY Right 02/01/2016   Procedure: TOTAL HIP ARTHROPLASTY;  Surgeon: Garald Balding, MD;  Location: [facility];  Service: Orthopedics;  Laterality: Right;   Family History  Problem Relation Age of Onset  . Heart attack Mother   . Kidney disease Mother   . Diabetes Mother   . Hypertension Mother   . Cancer Father   . COPD Father   . Hypertension Sister   . Hyperlipidemia Sister    Social History   Socioeconomic History  . Marital status: Single    Spouse name: Not on file  . Number of children: 0  . Years of education: 69  . Highest education level: High school graduate  Occupational History  . Occupation: retired  Scientific laboratory technician  . Financial resource strain: Not hard at all  . Food insecurity:    Worry: Never true    Inability: Never true  . Transportation needs:    Medical: No    Non-medical: No  Tobacco Use  . Smoking status: Never Smoker  . Smokeless tobacco: Never Used  Substance and Sexual Activity  . Alcohol use: No    Alcohol/week: 0.0 standard drinks  . Drug use: No  . Sexual activity: Not Currently    Birth control/protection: Surgical  Lifestyle  . Physical activity:    Days per week: 0 days    Minutes per session: 0 min  . Stress: To some extent  Relationships  . Social connections:    Talks on phone: More than three times a week  Gets together: Twice a week    Attends religious service: More than 4 times per year    Active member of club or organization: Yes     Attends meetings of clubs or organizations: More than 4 times per year    Relationship status: Never married  Other Topics Concern  . Not on file  Social History Narrative  . Not on file    Activities of Daily Living In your present state of health, do you have any difficulty performing the following activities: 11/19/2018 04/04/2018  Hearing? N -  Vision? N -  Difficulty concentrating or making decisions? N -  Walking or climbing stairs? N -  Dressing or bathing? N -  Doing errands, shopping? N N  Preparing Food and eating ? N -  Using the Toilet? N -  In the past six months, have you accidently leaked urine? Y -  Comment when she sleeps -  Do you have problems with loss of bowel control? N -  Managing your Medications? N -  Managing your Finances? N -  Housekeeping or managing your Housekeeping? N -  Some recent data might be hidden    Patient Literacy How often do you need to have someone help you when you read instructions, pamphlets, or other written materials from your doctor or pharmacy?: 1 - Never What is the last grade level you completed in school?: 12th grade  Exercise Current Exercise Habits: The patient does not participate in regular exercise at present, Exercise limited by: orthopedic condition(s)  Diet Patient reports consuming 4 meals a day and 2 snack(s) a day Patient reports that her primary diet is: Regular Patient reports that she does have regular access to food.   Depression Screen PHQ 2/9 Scores 11/19/2018 05/01/2018 02/27/2018 01/28/2018 11/23/2017 05/30/2017 02/27/2017  PHQ - 2 Score 0 0 2 2 2 3 6   PHQ- 9 Score - - 5 6 6 4 13      Fall Risk Fall Risk  11/19/2018 05/01/2018 01/28/2018 02/27/2017 08/16/2016  Falls in the past year? 0 0 No No No     Objective:  [name] seemed alert and oriented and she participated appropriately during our telephone visit.  Blood Pressure Weight BMI  BP Readings from Last 3 Encounters:  06/14/18 137/73  05/01/18  112/71  04/05/18 (!) 160/78   Wt Readings from Last 3 Encounters:  06/14/18 171 lb 9.6 oz (77.8 kg)  05/01/18 173 lb 6.4 oz (78.7 kg)  04/02/18 177 lb (80.3 kg)   BMI Readings from Last 1 Encounters:  06/14/18 29.46 kg/m    *Unable to obtain current vital signs, weight, and BMI due to telephone visit type  Hearing/Vision  . [name] did not seem to have difficulty with hearing/understanding during the telephone conversation . Reports that she has not had a formal eye exam by an eye care professional within the past year . Reports that she has not had a formal hearing evaluation within the past year *Unable to fully assess hearing and vision during telephone visit type  Cognitive Function: 6CIT Screen 11/19/2018  What Year? 0 points  What month? 0 points  What time? 0 points  Count back from 20 0 points  Months in reverse 0 points  Repeat phrase 0 points  Total Score 0    Normal Cognitive Function Screening: Yes (Normal:0-7, Significant for Dysfunction: >8)  Immunization & Health Maintenance Record Immunization History  Administered Date(s) Administered  . Influenza, High Dose Seasonal PF 04/05/2018  .  Influenza,inj,Quad PF,6+ Mos 04/13/2016  . Pneumococcal Conjugate-13 05/01/2018  . Pneumococcal Polysaccharide-23 02/04/2016    Health Maintenance  Topic Date Due  . Hepatitis C Screening  March 05, 1951  . FOOT EXAM  01/30/1962  . OPHTHALMOLOGY EXAM  01/30/1962  . TETANUS/TDAP  01/31/1971  . MAMMOGRAM  11/23/2013  . DEXA SCAN  01/30/2017  . HEMOGLOBIN A1C  10/30/2018  . INFLUENZA VACCINE  01/11/2019  . COLONOSCOPY  11/19/2019  . PNA vac Low Risk Adult (2 of 2 - PPSV23) 02/03/2021       Assessment  This is a routine wellness examination for [name].  Health Maintenance: Due or Overdue Health Maintenance Due  Topic Date Due  . Hepatitis C Screening  Aug 31, 1950  . FOOT EXAM  01/30/1962  . OPHTHALMOLOGY EXAM  01/30/1962  . TETANUS/TDAP  01/31/1971  .  MAMMOGRAM  11/23/2013  . DEXA SCAN  01/30/2017  . HEMOGLOBIN A1C  10/30/2018    [place] does not need a referral for Community Assistance: Care Management:   no Social Work:    no Prescription Assistance:  no Nutrition/Diabetes Education:  no   Plan:  Personalized Goals Goals Addressed            This Visit's Progress   . Patient Stated (pt-stated)       "getting back with God and worshiping him after this virus"      Personalized Health Maintenance & Screening Recommendations  Td vaccine Bone densitometry screening Shingles vaccine  Lung Cancer Screening Recommended: no (Low Dose CT Chest recommended if Age 24-80 years, 30 pack-year currently smoking OR have quit w/in past 15 years) Hepatitis C Screening recommended: yes HIV Screening recommended: no  Advanced Directives: Written information was not prepared per patient's request.  Referrals & Orders No orders of the defined types were placed in this encounter.   Follow-up Plan . Follow-up with Terald Sleeper, PA-C as planned . Schedule diabetic eye exam as soon as your eye doctor begins seeing patients again . Consider shingles and TDAP vaccines at your next visit with your PCP   I have personally reviewed and noted the following in the patient's chart:   . Medical and social history . Use of alcohol, tobacco or illicit drugs  . Current medications and supplements . Functional ability and status . Nutritional status . Physical activity . Advanced directives . List of other physicians . Hospitalizations, surgeries, and ER visits in previous 12 months . Vitals . Screenings to include cognitive, depression, and falls . Referrals and appointments  In addition, I have reviewed and discussed with [name] certain preventive protocols, quality metrics, and best practice recommendations. A written personalized care plan for preventive services as well as general preventive health recommendations is  available and can be mailed to the patient at her request.      Marylin Crosby  11/19/2018

## 2018-11-19 NOTE — Patient Instructions (Signed)
Preventive Care 42 Years and Older, Female Preventive care refers to lifestyle choices and visits with your health care provider that can promote health and wellness. What does preventive care include?  A yearly physical exam. This is also called an annual well check.  Dental exams once or twice a year.  Routine eye exams. Ask your health care provider how often you should have your eyes checked.  Personal lifestyle choices, including: ? Daily care of your teeth and gums. ? Regular physical activity. ? Eating a healthy diet. ? Avoiding tobacco and drug use. ? Limiting alcohol use. ? Practicing safe sex. ? Taking low-dose aspirin every day. ? Taking vitamin and mineral supplements as recommended by your health care provider. What happens during an annual well check? The services and screenings done by your health care provider during your annual well check will depend on your age, overall health, lifestyle risk factors, and family history of disease. Counseling Your health care provider may ask you questions about your:  Alcohol use.  Tobacco use.  Drug use.  Emotional well-being.  Home and relationship well-being.  Sexual activity.  Eating habits.  History of falls.  Memory and ability to understand (cognition).  Work and work Statistician.  Reproductive health.  Screening You may have the following tests or measurements:  Height, weight, and BMI.  Blood pressure.  Lipid and cholesterol levels. These may be checked every 5 years, or more frequently if you are over 30 years old.  Skin check.  Lung cancer screening. You may have this screening every year starting at age 27 if you have a 30-pack-year history of smoking and currently smoke or have quit within the past 15 years.  Colorectal cancer screening. All adults should have this screening starting at age 33 and continuing until age 46. You will have tests every 1-10 years, depending on your results and the  type of screening test. People at increased risk should start screening at an earlier age. Screening tests may include: ? Guaiac-based fecal occult blood testing. ? Fecal immunochemical test (FIT). ? Stool DNA test. ? Virtual colonoscopy. ? Sigmoidoscopy. During this test, a flexible tube with a tiny camera (sigmoidoscope) is used to examine your rectum and lower colon. The sigmoidoscope is inserted through your anus into your rectum and lower colon. ? Colonoscopy. During this test, a long, thin, flexible tube with a tiny camera (colonoscope) is used to examine your entire colon and rectum.  Hepatitis C blood test.  Hepatitis B blood test.  Sexually transmitted disease (STD) testing.  Diabetes screening. This is done by checking your blood sugar (glucose) after you have not eaten for a while (fasting). You may have this done every 1-3 years.  Bone density scan. This is done to screen for osteoporosis. You may have this done starting at age 37.  Mammogram. This may be done every 1-2 years. Talk to your health care provider about how often you should have regular mammograms. Talk with your health care provider about your test results, treatment options, and if necessary, the need for more tests. Vaccines Your health care provider may recommend certain vaccines, such as:  Influenza vaccine. This is recommended every year.  Tetanus, diphtheria, and acellular pertussis (Tdap, Td) vaccine. You may need a Td booster every 10 years.  Varicella vaccine. You may need this if you have not been vaccinated.  Zoster vaccine. You may need this after age 38.  Measles, mumps, and rubella (MMR) vaccine. You may need at least  one dose of MMR if you were born in 1957 or later. You may also need a second dose.  Pneumococcal 13-valent conjugate (PCV13) vaccine. One dose is recommended after age 24.  Pneumococcal polysaccharide (PPSV23) vaccine. One dose is recommended after age 24.  Meningococcal  vaccine. You may need this if you have certain conditions.  Hepatitis A vaccine. You may need this if you have certain conditions or if you travel or work in places where you may be exposed to hepatitis A.  Hepatitis B vaccine. You may need this if you have certain conditions or if you travel or work in places where you may be exposed to hepatitis B.  Haemophilus influenzae type b (Hib) vaccine. You may need this if you have certain conditions. Talk to your health care provider about which screenings and vaccines you need and how often you need them. This information is not intended to replace advice given to you by your health care provider. Make sure you discuss any questions you have with your health care provider. Document Released: 06/25/2015 Document Revised: 07/19/2017 Document Reviewed: 03/30/2015 Elsevier Interactive Patient Education  2019 [employer].

## 2018-11-21 ENCOUNTER — Other Ambulatory Visit: Payer: Self-pay | Admitting: Internal Medicine

## 2018-11-22 ENCOUNTER — Other Ambulatory Visit: Payer: Self-pay | Admitting: Physician Assistant

## 2018-11-22 DIAGNOSIS — E1065 Type 1 diabetes mellitus with hyperglycemia: Secondary | ICD-10-CM

## 2018-11-25 ENCOUNTER — Other Ambulatory Visit: Payer: Self-pay | Admitting: *Deleted

## 2018-11-25 MED ORDER — ALBUTEROL SULFATE HFA 108 (90 BASE) MCG/ACT IN AERS: 2.0000 | INHALATION_SPRAY | Freq: Four times a day (QID) | RESPIRATORY_TRACT | 11 refills | Status: DC | PRN

## 2018-12-24 ENCOUNTER — Other Ambulatory Visit: Payer: Self-pay | Admitting: Physician Assistant

## 2018-12-24 DIAGNOSIS — E039 Hypothyroidism, unspecified: Secondary | ICD-10-CM

## 2019-01-09 ENCOUNTER — Other Ambulatory Visit: Payer: Self-pay

## 2019-01-10 ENCOUNTER — Other Ambulatory Visit: Payer: Medicare Other

## 2019-01-10 ENCOUNTER — Ambulatory Visit (INDEPENDENT_AMBULATORY_CARE_PROVIDER_SITE_OTHER): Payer: Medicare Other | Admitting: *Deleted

## 2019-01-10 DIAGNOSIS — I10 Essential (primary) hypertension: Secondary | ICD-10-CM

## 2019-01-10 DIAGNOSIS — E1065 Type 1 diabetes mellitus with hyperglycemia: Secondary | ICD-10-CM | POA: Diagnosis not present

## 2019-01-10 DIAGNOSIS — Z23 Encounter for immunization: Secondary | ICD-10-CM

## 2019-01-10 DIAGNOSIS — E039 Hypothyroidism, unspecified: Secondary | ICD-10-CM

## 2019-01-10 LAB — BAYER DCA HB A1C WAIVED: HB A1C (BAYER DCA - WAIVED): 8.2 % — ABNORMAL HIGH (ref <7.0)

## 2019-01-11 LAB — CMP14+EGFR
ALT: 11 IU/L (ref 0–32)
AST: 16 IU/L (ref 0–40)
Albumin/Globulin Ratio: 1.4 (ref 1.2–2.2)
Albumin: 4.2 g/dL (ref 3.8–4.8)
Alkaline Phosphatase: 208 IU/L — ABNORMAL HIGH (ref 39–117)
BUN/Creatinine Ratio: 15 (ref 12–28)
BUN: 16 mg/dL (ref 8–27)
Bilirubin Total: 0.3 mg/dL (ref 0.0–1.2)
CO2: 21 mmol/L (ref 20–29)
Calcium: 10.2 mg/dL (ref 8.7–10.3)
Chloride: 100 mmol/L (ref 96–106)
Creatinine, Ser: 1.04 mg/dL — ABNORMAL HIGH (ref 0.57–1.00)
GFR calc Af Amer: 65 mL/min/{1.73_m2} (ref >59)
GFR calc non Af Amer: 56 mL/min/{1.73_m2} — ABNORMAL LOW (ref >59)
Globulin, Total: 3.1 g/dL (ref 1.5–4.5)
Glucose: 191 mg/dL — ABNORMAL HIGH (ref 65–99)
Potassium: 4.9 mmol/L (ref 3.5–5.2)
Sodium: 141 mmol/L (ref 134–144)
Total Protein: 7.3 g/dL (ref 6.0–8.5)

## 2019-01-11 LAB — MICROALBUMIN / CREATININE URINE RATIO
Creatinine, Urine: 336.4 mg/dL
Microalb/Creat Ratio: 11 mg/g creat (ref 0–29)
Microalbumin, Urine: 35.8 ug/mL

## 2019-01-11 LAB — CBC WITH DIFFERENTIAL/PLATELET
Basophils Absolute: 0 10*3/uL (ref 0.0–0.2)
Basos: 0 %
EOS (ABSOLUTE): 0.1 10*3/uL (ref 0.0–0.4)
Eos: 1 %
Hematocrit: 37 % (ref 34.0–46.6)
Hemoglobin: 11.7 g/dL (ref 11.1–15.9)
Immature Grans (Abs): 0 10*3/uL (ref 0.0–0.1)
Immature Granulocytes: 0 %
Lymphocytes Absolute: 2.4 10*3/uL (ref 0.7–3.1)
Lymphs: 29 %
MCH: 23.7 pg — ABNORMAL LOW (ref 26.6–33.0)
MCHC: 31.6 g/dL (ref 31.5–35.7)
MCV: 75 fL — ABNORMAL LOW (ref 79–97)
Monocytes Absolute: 0.6 10*3/uL (ref 0.1–0.9)
Monocytes: 7 %
Neutrophils Absolute: 5.2 10*3/uL (ref 1.4–7.0)
Neutrophils: 63 %
Platelets: 296 10*3/uL (ref 150–450)
RBC: 4.94 x10E6/uL (ref 3.77–5.28)
RDW: 13.1 % (ref 11.7–15.4)
WBC: 8.4 10*3/uL (ref 3.4–10.8)

## 2019-01-11 LAB — LIPID PANEL
Chol/HDL Ratio: 4.1 ratio (ref 0.0–4.4)
Cholesterol, Total: 156 mg/dL (ref 100–199)
HDL: 38 mg/dL — ABNORMAL LOW (ref >39)
LDL Calculated: 94 mg/dL (ref 0–99)
Triglycerides: 122 mg/dL (ref 0–149)
VLDL Cholesterol Cal: 24 mg/dL (ref 5–40)

## 2019-01-11 LAB — TSH: TSH: 0.015 u[IU]/mL — ABNORMAL LOW (ref 0.450–4.500)

## 2019-01-19 ENCOUNTER — Other Ambulatory Visit: Payer: Self-pay | Admitting: Physician Assistant

## 2019-01-19 DIAGNOSIS — E039 Hypothyroidism, unspecified: Secondary | ICD-10-CM

## 2019-01-20 NOTE — Telephone Encounter (Signed)
Per 01/10/19 labwork, ordered levothyroxine 88 mcg, cancelled refill request from [facility] for the 100 mcg

## 2019-01-24 ENCOUNTER — Telehealth: Payer: Self-pay | Admitting: Physician Assistant

## 2019-01-24 NOTE — Telephone Encounter (Signed)
Aware of results. 

## 2019-01-28 ENCOUNTER — Ambulatory Visit (INDEPENDENT_AMBULATORY_CARE_PROVIDER_SITE_OTHER): Payer: Medicare Other | Admitting: Nurse Practitioner

## 2019-01-28 ENCOUNTER — Other Ambulatory Visit: Payer: Self-pay

## 2019-01-28 ENCOUNTER — Encounter: Payer: Self-pay | Admitting: Nurse Practitioner

## 2019-01-28 DIAGNOSIS — Z1211 Encounter for screening for malignant neoplasm of colon: Secondary | ICD-10-CM | POA: Insufficient documentation

## 2019-01-28 DIAGNOSIS — Z0000 Encounter for general adult medical examination without abnormal findings: Secondary | ICD-10-CM | POA: Diagnosis not present

## 2019-01-28 NOTE — Assessment & Plan Note (Signed)
Previous colonoscopy completed at [facility] in 2011 which was essentially normal.  She started receiving mail from her insurance company indicating it is time for her next colonoscopy.  She states her mom did have colon polyps previously.  She has very mild GI complaints including mild/intermittent nausea, some constipation which she controls with over-the-counter regimen.  No overt or significant GI complaints.  At this point we will proceed with a colonoscopy.  Further recommendations to follow.  Return for follow-up based on recommendations made after her colonoscopy.  Proceed with colonoscopy on propofol/MAC with Dr. Oneida Alar in the near future. The risks, benefits, and alternatives have been discussed in detail with the patient. They state understanding and desire to proceed.   Patient is currently on Celexa, hydroxyzine for insomnia.  No other anticoagulants, anxiolytics, chronic pain medications, or antidepressants.  She is on diabetes medications with Humalog insulin daily, Lantus insulin daily, metformin twice daily. We will make appropriate adjustments.  She is currently on iron supplementation.  We will plan for the procedure on propofol/MAC given insomnia, antidepressant, insomnia medications in order to promote adequate sedation.

## 2019-01-28 NOTE — Progress Notes (Addendum)
REVIEWED-NO ADDITIONAL RECOMMENDATIONS. Primary Care Physician:  Terald Sleeper, PA-C Primary Gastroenterologist:  Dr. Oneida Alar  Chief Complaint  Patient presents with  . Consult    TCS. last had done over 10 yrs ago. Parents had polyps    HPI:   [name] is a 68 y.o. female who presents on referral from primary care to schedule colonoscopy.  Nurse/phone triage was deferred office visit due to medications possibly necessitating augmented sedation.  Reviewed information provided with referral including office visit dated 10/30/2018 which was a routine follow-up.  That visit with her primary care was completed virtually due to COVID-19/coronavirus pandemic.  Notes that she is due for colonoscopy as it has been over 10 years.  She did note insomnia/difficulty sleeping and her hydroxyzine dose was increased to 25 mg at bedtime.  Last colonoscopy completed at [facility] dated 11/18/2009 which found normal colonoscopy.  Recommended repeat exam in 10 years (2021).  Today she states she's doing well overall. Notes that her Mom had colon polyps. Has mild abdominal discomfort when she needs to have a bowel movement which resolves after a bowel movement. Since hysterectomy, needs OTC constipation medication to keep bowel regular. Rare nausea, no vomiting. Thinks this is related to metformin; brief and self-resolving. Denies fever, chills, unintentional weight loss. Denies URI or flu-like symptoms. Denies loss of sense of taste or smell. Denies chest pain, dyspnea, dizziness, lightheadedness, syncope, near syncope. Denies any other upper or lower GI symptoms.  Past Medical History:  Diagnosis Date  . Allergic rhinitis   . Allergy   . Anemia   . Ankle swelling   . Anxiety    takes Xanax daily   . Arthritis   . Asthma   . Blurred vision, bilateral   . Chronic kidney disease   . Depression    takes Celexa daily  . Diabetes mellitus without complication ([place])    type II; takes Actos daily   . GERD (gastroesophageal reflux disease)   . Gout   . Headache   . Heart murmur   . Heart palpitations   . Hemorrhoids   . History of blood transfusion    no abnormal reaction noted  . History of bronchitis   . Hyperlipidemia   . Hypertension    takes Catapres and Quinapril daily  . Hypothyroidism    takes Synthroid daily  . Moderate mitral insufficiency    a. 12/2014 Echo: EF 55-60%, mod MR, mildly to moderately dil LA, PASP 37mmHg.  . Myocardial infarction ([place])    " Minor"  . Osteoarthritis   . Paroxysmal supraventricular tachycardia ([place])   . Recurrent ventral incisional hernia   . Ringing in ears   . Shortness of breath dyspnea    ALbuterol daily as needed  . Stevens-Johnson disease ([place])   . Urinary incontinence   . Wears glasses     Past Surgical History:  Procedure Laterality Date  . ABDOMINAL HYSTERECTOMY    . BOWEL RESECTION    . COLON SURGERY    . COLONOSCOPY    . ESOPHAGOGASTRODUODENOSCOPY    . [place]  . HERNIA REPAIR     hiatal hernia repair  . [place]   pt is unsure of exact date but thinks it was early 79's  . INCISIONAL HERNIA REPAIR N/A 04/02/2018   Procedure: OPEN VENTRAL HERNIA REPAIR WITH MESH, Explantation of hernia mesh;  Surgeon: Donnie Mesa, MD;  Location: [place];  Service: General;  Laterality: N/A;  . INSERTION OF MESH N/A 04/02/2018   Procedure: INSERTION OF MESH;  Surgeon: Donnie Mesa, MD;  Location: [place];  Service: General;  Laterality: N/A;  . TOTAL HIP ARTHROPLASTY Left 02/02/2015   Procedure: LEFT TOTAL HIP ARTHROPLASTY;  Surgeon: Garald Balding, MD;  Location: [place];  Service: Orthopedics;  Laterality: Left;  . TOTAL HIP ARTHROPLASTY Right 02/01/2016   Procedure: TOTAL HIP ARTHROPLASTY;  Surgeon: Garald Balding, MD;  Location: [place];  Service: Orthopedics;  Laterality: Right;    Current Outpatient Medications  Medication Sig Dispense Refill  . acetaminophen (TYLENOL) 500 MG tablet Take  1,000 mg by mouth every 8 (eight) hours as needed (pain).     [name] albuterol (VENTOLIN HFA) 108 (90 Base) MCG/ACT inhaler Inhale 2 puffs into the lungs every 6 (six) hours as needed for wheezing or shortness of breath. 18 g 11  . atorvastatin (LIPITOR) 20 MG tablet Take 1 tablet (20 mg total) by mouth daily. 30 tablet 5  . citalopram (CELEXA) 20 MG tablet Take 1 tablet (20 mg total) by mouth daily. 180 tablet 3  . cloNIDine (CATAPRES) 0.1 MG tablet Take 1 tablet (0.1 mg total) by mouth daily. 90 tablet 3  . ergocalciferol (VITAMIN D2) 1.25 MG (50000 UT) capsule Take 1 capsule (50,000 Units total) by mouth See admin instructions. Give 1 tablet by mouth on Tues. and Fri. once a day 12 capsule 3  . fluticasone (FLONASE) 50 MCG/ACT nasal spray Place 1 spray into both nostrils 2 (two) times daily as needed for allergies or rhinitis.    . furosemide (LASIX) 40 MG tablet Take 1 tablet (40 mg total) by mouth daily. 90 tablet 1  . glucose blood (ONE TOUCH ULTRA TEST) test strip TEST TWICE A DAY 100 each 12  . HUMALOG 100 UNIT/ML injection INJECT 10 UNITS SUBCUTANEOUSLY INTO SKIN DAILY 10 mL 0  . hydrOXYzine (ATARAX/VISTARIL) 25 MG tablet Take 1 tablet (25 mg total) by mouth 3 (three) times daily. Take one at bedtime 60 tablet 5  . insulin glargine (LANTUS) 100 UNIT/ML injection INJECT 25 UNITS TOTAL INTO THE SKIN AT BEDTIME. 10 mL 5  . Insulin Syringe-Needle U-100 (BD INSULIN SYRINGE U/F) 31G X 5/16" 0.5 ML MISC USE TO INJECT LANTUS AND NOVOLOG DAILY 100 each 11  . KLOR-CON M20 20 MEQ tablet TAKE 2 TABLETS BY MOUTH IN IN THE MORNING WITH FOOD & A FULL GLASS OF LIQUID. DO NOT CRUSH 60 tablet 5  . levothyroxine (SYNTHROID) 88 MCG tablet Take 1 tablet (88 mcg total) by mouth daily. 90 tablet 0  . Magnesium 400 MG TABS Take 400 mg by mouth daily.    . magnesium hydroxide (PHILLIPS MILK OF MAGNESIA) 400 MG/5ML suspension Take 60 mLs by mouth See admin instructions. Every 3 days - [name]    . metFORMIN  (GLUCOPHAGE) 500 MG tablet Take 1 tablet (500 mg total) by mouth 2 (two) times daily with a meal. 180 tablet 3  . metoprolol succinate (TOPROL XL) 25 MG 24 hr tablet Take 1 tablet (25 mg total) by mouth daily. 90 tablet 1  . montelukast (SINGULAIR) 10 MG tablet TAKE 1 TABLET BY MOUTH ONCE DAILY IN THE EVENING 90 tablet 3  . pantoprazole (PROTONIX) 40 MG tablet Take 1 tablet (40 mg total) by mouth daily. 30 tablet 11  . quinapril (ACCUPRIL) 5 MG tablet Take 2 tablets (10 mg total) by mouth daily. 90 tablet 1  . umeclidinium-vilanterol (ANORO ELLIPTA) 62.5-25 MCG/INH AEPB  Inhale 1 puff into the lungs daily. 60 each 4   No current facility-administered medications for this visit.     Allergies as of 01/28/2019 - Review Complete 01/28/2019  Allergen Reaction Noted  . Other Itching, Nausea Only, and Other (See Comments) 12/25/2014  . Aspirin Other (See Comments) 03/29/2018  . Allopurinol Rash 12/25/2014  . Penicillins Rash 12/25/2014  . Sulfa antibiotics Rash 12/25/2014    Family History  Problem Relation Age of Onset  . Heart attack Mother   . Kidney disease Mother   . Diabetes Mother   . Hypertension Mother   . Cancer Father        Widespread; unknown primary  . COPD Father   . Hypertension Sister   . Hyperlipidemia Sister   . Colon cancer Maternal Aunt   . Colon cancer Paternal Aunt     Social History   Socioeconomic History  . Marital status: Single    Spouse name: Not on file  . Number of children: 0  . Years of education: 73  . Highest education level: High school graduate  Occupational History  . Occupation: retired  Scientific laboratory technician  . Financial resource strain: Not hard at all  . Food insecurity    Worry: Never true    Inability: Never true  . Transportation needs    Medical: No    Non-medical: No  Tobacco Use  . Smoking status: Never Smoker  . Smokeless tobacco: Never Used  Substance and Sexual Activity  . Alcohol use: No    Alcohol/week: 0.0 standard drinks   . Drug use: No  . Sexual activity: Not Currently    Birth control/protection: Surgical  Lifestyle  . Physical activity    Days per week: 0 days    Minutes per session: 0 min  . Stress: To some extent  Relationships  . Social connections    Talks on phone: More than three times a week    Gets together: Twice a week    Attends religious service: More than 4 times per year    Active member of club or organization: Yes    Attends meetings of clubs or organizations: More than 4 times per year    Relationship status: Never married  . Intimate partner violence    Fear of current or ex partner: No    Emotionally abused: No    Physically abused: No    Forced sexual activity: No  Other Topics Concern  . Not on file  Social History Narrative  . Not on file    Review of Systems: Complete ROS negative except as per HPI.    Physical Exam: BP 131/83   Pulse 88   Temp (!) 96.9 F (36.1 C) (Oral)   Ht 5\' 4"  (1.626 m)   Wt 178 lb 6.4 oz (80.9 kg)   BMI 30.62 kg/m  General:   Alert and oriented. Pleasant and cooperative. Well-nourished and well-developed.  Head:  Normocephalic and atraumatic. Eyes:  Without icterus, sclera clear and conjunctiva pink.  Ears:  Normal auditory acuity. Cardiovascular:  S1, S2 present without murmurs appreciated. Extremities without clubbing or edema. Respiratory:  Clear to auscultation bilaterally. No wheezes, rales, or rhonchi. No distress.  Gastrointestinal:  +BS, soft, non-tender and non-distended. No HSM noted. No guarding or rebound. No masses appreciated.  Rectal:  Deferred  Musculoskalatal:  Symmetrical without gross deformities. Skin:  Intact without significant lesions or rashes. Neurologic:  Alert and oriented x4;  grossly normal neurologically. Psych:  Alert  and cooperative. Normal mood and affect. Heme/Lymph/Immune: No excessive bruising noted.    01/28/2019 11:08 AM   Disclaimer: This note was dictated with voice recognition  software. Similar sounding words can inadvertently be transcribed and may not be corrected upon review.

## 2019-01-28 NOTE — Patient Instructions (Signed)
Your health issues we discussed today were:   Need for colonoscopy: 1. We will schedule your colonoscopy for you 2. We will make adjustments to your diabetes medications for the night before and day after your procedure 3. We will send in your bowel prep to your pharmacy 4. Further recommendations will be made based on results your colonoscopy  Overall I recommend:  1. Return for follow-up based on recommendations made after your colonoscopy or as needed for any GI symptoms 2. Continue your current medications 3. Call us if you have any questions or concerns.   Because of recent events of COVID-19 ("Coronavirus"), follow CDC recommendations:  1. Wash your hand frequently 2. Avoid touching your face 3. Stay away from people who are sick 4. If you have symptoms such as fever, cough, shortness of breath then call your healthcare provider for further guidance 5. If you are sick, STAY AT HOME unless otherwise directed by your healthcare provider. 6. Follow directions from state and national officials regarding staying safe   At [facility] we value your feedback. You may receive a survey about your visit today. Please share your experience as we strive to create trusting relationships with our patients to provide genuine, compassionate, quality care.  We appreciate your understanding and patience as we review any laboratory studies, imaging, and other diagnostic tests that are ordered as we care for you. Our office policy is 5 business days for review of these results, and any emergent or urgent results are addressed in a timely manner for your best interest. If you do not hear from our office in 1 week, please contact us.   We also encourage the use of MyChart, which contains your medical information for your review as well. If you are not enrolled in this feature, an access code is on this after visit summary for your convenience. Thank you for allowing [place] to be involved in  your care.  It was great to see you today!  I hope you have a great summer!!

## 2019-02-12 ENCOUNTER — Telehealth: Payer: Self-pay | Admitting: *Deleted

## 2019-02-12 ENCOUNTER — Other Ambulatory Visit: Payer: Self-pay | Admitting: *Deleted

## 2019-02-12 DIAGNOSIS — Z1211 Encounter for screening for malignant neoplasm of colon: Secondary | ICD-10-CM

## 2019-02-12 MED ORDER — NA SULFATE-K SULFATE-MG SULF 17.5-3.13-1.6 GM/177ML PO SOLN: 1.0000 | Freq: Once | ORAL | 0 refills | Status: AC

## 2019-02-12 NOTE — Telephone Encounter (Signed)
Called patient. She is scheduled for TCS with propofol with [facility] 11/17 at 7:30am. She is aware she will need pre-op/covid-19 testing as well. Advised will mail information with her prep instructions. Rx sent in. Orders entered.

## 2019-02-13 ENCOUNTER — Encounter: Payer: Self-pay | Admitting: *Deleted

## 2019-02-14 ENCOUNTER — Ambulatory Visit (INDEPENDENT_AMBULATORY_CARE_PROVIDER_SITE_OTHER): Payer: Medicare Other | Admitting: Family

## 2019-02-14 ENCOUNTER — Telehealth: Payer: Self-pay | Admitting: Physician Assistant

## 2019-02-14 ENCOUNTER — Encounter: Payer: Self-pay | Admitting: Family

## 2019-02-14 ENCOUNTER — Other Ambulatory Visit: Payer: Self-pay

## 2019-02-14 DIAGNOSIS — E1149 Type 2 diabetes mellitus with other diabetic neurological complication: Secondary | ICD-10-CM | POA: Diagnosis not present

## 2019-02-14 DIAGNOSIS — E1065 Type 1 diabetes mellitus with hyperglycemia: Secondary | ICD-10-CM | POA: Diagnosis not present

## 2019-02-14 MED ORDER — JARDIANCE 10 MG PO TABS: 10.0000 mg | ORAL_TABLET | Freq: Every day | ORAL | 1 refills | Status: DC

## 2019-02-14 MED ORDER — GABAPENTIN 100 MG PO CAPS: 100.0000 mg | ORAL_CAPSULE | Freq: Three times a day (TID) | ORAL | 3 refills | Status: DC

## 2019-02-14 NOTE — Progress Notes (Signed)
   Virtual Visit via telephone Note Due to COVID-19 pandemic this visit was conducted virtually. This visit type was conducted due to national recommendations for restrictions regarding the COVID-19 Pandemic (e.g. social distancing, sheltering in place) in an effort to limit this patient's exposure and mitigate transmission in our community. All issues noted in this document were discussed and addressed.  A physical exam was not performed with this format.  I connected with [name] on 02/14/19 at 1:30 pm by telephone and verified that I am speaking with the correct person using two identifiers. [name] is currently located at home  and sister is currently with her during visit. The provider, Evelina Dun, FNP is located in their office at time of visit.  I discussed the limitations, risks, security and privacy concerns of performing an evaluation and management service by telephone and the availability of in person appointments. I also discussed with the patient that there may be a patient responsible charge related to this service. The patient expressed understanding and agreed to proceed.   History and Present Illness:  PT calls the office today with bilateral feet and leg pain that started over the last year. She states the pain is worse in her right leg when she drives. She is a diabetic and last month her A1C was 8.2.  Diabetes She presents for her follow-up diabetic visit. She has type 2 diabetes mellitus. There are no hypoglycemic associated symptoms. Associated symptoms include foot paresthesias. Pertinent negatives for diabetes include no blurred vision. Symptoms are worsening. Diabetic complications include heart disease. Risk factors for coronary artery disease include diabetes mellitus, dyslipidemia, hypertension, sedentary lifestyle and post-menopausal. She is following a generally unhealthy diet. Her overall blood glucose range is 180-200 mg/dl. Eye exam is not current.      Review of Systems  Eyes: Negative for blurred vision.     Observations/Objective: No SOB or distress noted   Assessment and Plan: 1. Other diabetic neurological complication associated with type 2 diabetes mellitus ([place]) Will start gabapentin 100 mg TID for neuropathy pain Discussed we need to have good control of our blood sugars Will start Jardiance 10  Mg today Strict low carb diet and encourage exercising  Keep follow up with PCP - gabapentin (NEURONTIN) 100 MG capsule; Take 1 capsule (100 mg total) by mouth 3 (three) times daily.  Dispense: 90 capsule; Refill: 3  2. Uncontrolled type 1 diabetes mellitus with hyperglycemia ([facility]) - empagliflozin (JARDIANCE) 10 MG TABS tablet; Take 10 mg by mouth daily before breakfast.  Dispense: 90 tablet; Refill: 1     I discussed the assessment and treatment plan with the patient. The patient was provided an opportunity to ask questions and all were answered. The patient agreed with the plan and demonstrated an understanding of the instructions.   The patient was advised to call back or seek an in-person evaluation if the symptoms worsen or if the condition fails to improve as anticipated.  The above assessment and management plan was discussed with the patient. The patient verbalized understanding of and has agreed to the management plan. Patient is aware to call the clinic if symptoms persist or worsen. Patient is aware when to return to the clinic for a follow-up visit. Patient educated on when it is appropriate to go to the emergency department.   Time call ended:  1:42 pm  I provided 12 minutes of non-face-to-face time during this encounter.    Evelina Dun, FNP

## 2019-02-24 ENCOUNTER — Telehealth: Payer: Self-pay | Admitting: Gastroenterology

## 2019-02-24 NOTE — Telephone Encounter (Signed)
Called pt, med list updated.

## 2019-02-24 NOTE — Telephone Encounter (Signed)
Pt is scheduled with SF on 04/29/2019. She wanted to update her med list with the nurse. Please call [phone]

## 2019-03-01 ENCOUNTER — Other Ambulatory Visit: Payer: Self-pay | Admitting: Family Medicine

## 2019-03-07 ENCOUNTER — Other Ambulatory Visit: Payer: Self-pay | Admitting: Physician Assistant

## 2019-03-07 DIAGNOSIS — F411 Generalized anxiety disorder: Secondary | ICD-10-CM

## 2019-03-14 ENCOUNTER — Other Ambulatory Visit: Payer: Self-pay | Admitting: Physician Assistant

## 2019-03-14 DIAGNOSIS — E1065 Type 1 diabetes mellitus with hyperglycemia: Secondary | ICD-10-CM

## 2019-03-16 ENCOUNTER — Other Ambulatory Visit: Payer: Self-pay | Admitting: Physician Assistant

## 2019-03-18 ENCOUNTER — Telehealth: Payer: Self-pay | Admitting: Physician Assistant

## 2019-03-18 NOTE — Telephone Encounter (Signed)
Appointment scheduled 03/19/2019 at 8 am

## 2019-03-19 ENCOUNTER — Other Ambulatory Visit: Payer: Self-pay

## 2019-03-19 ENCOUNTER — Ambulatory Visit: Payer: Medicare Other

## 2019-03-19 DIAGNOSIS — Z23 Encounter for immunization: Secondary | ICD-10-CM

## 2019-03-19 NOTE — Progress Notes (Signed)
Shingrix vaccine given to left deltoid.  Patient tolerated well. 

## 2019-04-21 ENCOUNTER — Other Ambulatory Visit: Payer: Self-pay | Admitting: Physician Assistant

## 2019-04-21 DIAGNOSIS — E039 Hypothyroidism, unspecified: Secondary | ICD-10-CM

## 2019-04-23 NOTE — Patient Instructions (Signed)
SHADAWN SCHWERING  04/23/2019     @PREFPERIOPPHARMACY @   Your procedure is scheduled on  04/29/2019.  Report to [facility] at  [phone]  A.M.  Call this number if you have problems the morning of surgery:  [phone]   Remember:  Follow the diet and prep instructions given to you by Dr Oneida Alar office.                      Take these medicines the morning of surgery with A SIP OF WATER  Zyrtec, celexa, neurontin, hydroxyzine(if needed), levothyroxine, metoprolol, protonix. Use your inhaler before you come. Take 1/2 of your usual night time insulin the night before your procedure. DO NOT take any medications for diabetes the morning of your procedure.    Do not wear jewelry, make-up or nail polish.  Do not wear lotions, powders, or perfumes. Please wear deodorant and brush your teeth.  Do not shave 48 hours prior to surgery.  Men may shave face and neck.  Do not bring valuables to the hospital.  [facility] is not responsible for any belongings or valuables.  Contacts, dentures or bridgework may not be worn into surgery.  Leave your suitcase in the car.  After surgery it may be brought to your room.  For patients admitted to the hospital, discharge time will be determined by your treatment team.  Patients discharged the day of surgery will not be allowed to drive home.   Name and phone number of your driver:   family Special instructions:  none  Please read over the following fact sheets that you were given. Anesthesia Post-op Instructions and Care and Recovery After Surgery       Colonoscopy, Adult, Care After This sheet gives you information about how to care for yourself after your procedure. Your health care provider may also give you more specific instructions. If you have problems or questions, contact your health care provider. What can I expect after the procedure? After the procedure, it is common to have:  A small amount of blood in your stool for 24 hours after  the procedure.  Some gas.  Mild abdominal cramping or bloating. Follow these instructions at home: General instructions  For the first 24 hours after the procedure: ? Do not drive or use machinery. ? Do not sign important documents. ? Do not drink alcohol. ? Do your regular daily activities at a slower pace than normal. ? Eat soft, easy-to-digest foods.  Take over-the-counter or prescription medicines only as told by your health care provider. Relieving cramping and bloating   Try walking around when you have cramps or feel bloated.  Apply heat to your abdomen as told by your health care provider. Use a heat source that your health care provider recommends, such as a moist heat pack or a heating pad. ? Place a towel between your skin and the heat source. ? Leave the heat on for 20-30 minutes. ? Remove the heat if your skin turns bright red. This is especially important if you are unable to feel pain, heat, or cold. You may have a greater risk of getting burned. Eating and drinking   Drink enough fluid to keep your urine pale yellow.  Resume your normal diet as instructed by your health care provider. Avoid heavy or fried foods that are hard to digest.  Avoid drinking alcohol for as long as instructed by your health care provider. Contact a health  care provider if:  You have blood in your stool 2-3 days after the procedure. Get help right away if:  You have more than a small spotting of blood in your stool.  You pass large blood clots in your stool.  Your abdomen is swollen.  You have nausea or vomiting.  You have a fever.  You have increasing abdominal pain that is not relieved with medicine. Summary  After the procedure, it is common to have a small amount of blood in your stool. You may also have mild abdominal cramping and bloating.  For the first 24 hours after the procedure, do not drive or use machinery, sign important documents, or drink alcohol.  Contact  your health care provider if you have a lot of blood in your stool, nausea or vomiting, a fever, or increased abdominal pain. This information is not intended to replace advice given to you by your health care provider. Make sure you discuss any questions you have with your health care provider. Document Released: 01/11/2004 Document Revised: 03/21/2017 Document Reviewed: 08/10/2015 Elsevier Patient Education  2020 [place] After These instructions provide you with information about caring for yourself after your procedure. Your health care provider may also give you more specific instructions. Your treatment has been planned according to current medical practices, but problems sometimes occur. Call your health care provider if you have any problems or questions after your procedure. What can I expect after the procedure? After your procedure, you may:  Feel sleepy for several hours.  Feel clumsy and have poor balance for several hours.  Feel forgetful about what happened after the procedure.  Have poor judgment for several hours.  Feel nauseous or vomit.  Have a sore throat if you had a breathing tube during the procedure. Follow these instructions at home: For at least 24 hours after the procedure:      Have a responsible adult stay with you. It is important to have someone help care for you until you are awake and alert.  Rest as needed.  Do not: ? Participate in activities in which you could fall or become injured. ? Drive. ? Use heavy machinery. ? Drink alcohol. ? Take sleeping pills or medicines that cause drowsiness. ? Make important decisions or sign legal documents. ? Take care of children on your own. Eating and drinking  Follow the diet that is recommended by your health care provider.  If you vomit, drink water, juice, or soup when you can drink without vomiting.  Make sure you have little or no nausea before eating solid  foods. General instructions  Take over-the-counter and prescription medicines only as told by your health care provider.  If you have sleep apnea, surgery and certain medicines can increase your risk for breathing problems. Follow instructions from your health care provider about wearing your sleep device: ? Anytime you are sleeping, including during daytime naps. ? While taking prescription pain medicines, sleeping medicines, or medicines that make you drowsy.  If you smoke, do not smoke without supervision.  Keep all follow-up visits as told by your health care provider. This is important. Contact a health care provider if:  You keep feeling nauseous or you keep vomiting.  You feel light-headed.  You develop a rash.  You have a fever. Get help right away if:  You have trouble breathing. Summary  For several hours after your procedure, you may feel sleepy and have poor judgment.  Have a responsible adult  stay with you for at least 24 hours or until you are awake and alert. This information is not intended to replace advice given to you by your health care provider. Make sure you discuss any questions you have with your health care provider. Document Released: 09/19/2015 Document Revised: 08/27/2017 Document Reviewed: 09/19/2015 Elsevier Patient Education  2020 [employer].

## 2019-04-25 ENCOUNTER — Other Ambulatory Visit (HOSPITAL_COMMUNITY)
Admission: RE | Admit: 2019-04-25 | Discharge: 2019-04-25 | Disposition: A | Discharge status: Home / Self Care | Payer: Medicare Other | Source: Ambulatory Visit | Attending: Gastroenterology | Admitting: Gastroenterology

## 2019-04-25 ENCOUNTER — Other Ambulatory Visit: Payer: Self-pay

## 2019-04-25 ENCOUNTER — Encounter (HOSPITAL_COMMUNITY)
Admission: RE | Admit: 2019-04-25 | Discharge: 2019-04-25 | Disposition: A | Discharge status: Home / Self Care | Payer: Medicare Other | Source: Ambulatory Visit | Attending: Gastroenterology | Admitting: Gastroenterology

## 2019-04-25 ENCOUNTER — Encounter (HOSPITAL_COMMUNITY): Payer: Self-pay

## 2019-04-25 DIAGNOSIS — Z01812 Encounter for preprocedural laboratory examination: Secondary | ICD-10-CM | POA: Insufficient documentation

## 2019-04-25 DIAGNOSIS — Z20828 Contact with and (suspected) exposure to other viral communicable diseases: Secondary | ICD-10-CM | POA: Insufficient documentation

## 2019-04-25 LAB — BASIC METABOLIC PANEL
Anion gap: 11 (ref 5–15)
BUN: 23 mg/dL (ref 8–23)
CO2: 23 mmol/L (ref 22–32)
Calcium: 9.8 mg/dL (ref 8.9–10.3)
Chloride: 105 mmol/L (ref 98–111)
Creatinine, Ser: 1.44 mg/dL — ABNORMAL HIGH (ref 0.44–1.00)
GFR calc Af Amer: 43 mL/min — ABNORMAL LOW (ref >60)
GFR calc non Af Amer: 37 mL/min — ABNORMAL LOW (ref >60)
Glucose, Bld: 247 mg/dL — ABNORMAL HIGH (ref 70–99)
Potassium: 4.6 mmol/L (ref 3.5–5.1)
Sodium: 139 mmol/L (ref 135–145)

## 2019-04-25 LAB — SARS CORONAVIRUS 2 (TAT 6-24 HRS): SARS Coronavirus 2: NEGATIVE

## 2019-04-26 ENCOUNTER — Telehealth: Payer: Self-pay | Admitting: Gastroenterology

## 2019-04-26 NOTE — Telephone Encounter (Signed)
PLEASE CALL PT. HER BLOOD SUGAR WAS ELEVATED AT 247. SHE SHOULD STRICTLY ADHERE TO A DIABETIC DIET. IF SHE ARRIVES FOR HER PROCEDURE AND HER BLOOD SUGAR IS > 300, HER PROCEDURE MAY BE CANCELED.

## 2019-04-28 NOTE — OR Nursing (Signed)
Patient called per Dr. Oneida Alar request to report to her  Her glucose level.  To follow diabetic diet and take meds as prescribed. Concerns related to her glucose was  247 at time of PAT. Reinforced that she would like for her to be under 300 at time of procedure. . Verbalized understanding.

## 2019-04-28 NOTE — Anesthesia Preprocedure Evaluation (Addendum)
Anesthesia Evaluation  Patient identified by MRN, date of birth, ID band Patient awake    Reviewed: Allergy & Precautions, NPO status , Patient's Chart, lab work & pertinent test results, reviewed documented beta blocker date and time   History of Anesthesia Complications Negative for: history of anesthetic complications  Airway Mallampati: II  TM Distance: >3 FB Neck ROM: Full    Dental  (+) Missing, Dental Advisory Given   Pulmonary shortness of breath, asthma ,    Pulmonary exam normal breath sounds clear to auscultation       Cardiovascular Exercise Tolerance: Good hypertension, Pt. on medications and Pt. on home beta blockers + Past MI  Normal cardiovascular exam+ Valvular Problems/Murmurs  Rhythm:Regular Rate:Normal + Systolic murmurs 0000000 06:36:50 [facility]/SS ROUTINE RECORD Normal sinus rhythm Cannot rule out Anterior infarct , age undetermined Abnormal ECG Confirmed by Adrian Prows ([id number]) on 04/02/2018 7:19:42 PM 2016- Nuclear stress EF: 73%.  There was no ST segment deviation noted during stress.  The study is normal.  This is a low risk study.  The left ventricular ejection fraction is hyperdynamic (>65%   Neuro/Psych  Headaches, PSYCHIATRIC DISORDERS Anxiety Depression    GI/Hepatic Bowel prep,GERD  Medicated and Controlled,  Endo/Other  diabetes, Poorly Controlled, Type 2, Insulin Dependent, Oral Hypoglycemic AgentsHypothyroidism   Renal/GU Renal InsufficiencyRenal disease     Musculoskeletal  (+) Arthritis , Osteoarthritis,    Abdominal   Peds  Hematology  (+) anemia ,   Anesthesia Other Findings   Reproductive/Obstetrics                          Anesthesia Physical Anesthesia Plan  ASA: III  Anesthesia Plan: General   Post-op Pain Management:    Induction: Intravenous  PONV Risk Score and Plan: TIVA  Airway Management Planned: Nasal  Cannula, Natural Airway and Simple Face Mask  Additional Equipment:   Intra-op Plan:   Post-operative Plan:   Informed Consent: I have reviewed the patients History and Physical, chart, labs and discussed the procedure including the risks, benefits and alternatives for the proposed anesthesia with the patient or authorized representative who has indicated his/her understanding and acceptance.     Dental advisory given  Plan Discussed with: CRNA  Anesthesia Plan Comments:        Anesthesia Quick Evaluation

## 2019-04-29 ENCOUNTER — Ambulatory Visit (HOSPITAL_COMMUNITY): Payer: Medicare Other | Admitting: Anesthesiology

## 2019-04-29 ENCOUNTER — Ambulatory Visit (HOSPITAL_COMMUNITY)
Admission: RE | Admit: 2019-04-29 | Discharge: 2019-04-29 | Disposition: A | Discharge status: Home / Self Care | Payer: Medicare Other | Attending: Gastroenterology | Admitting: Gastroenterology

## 2019-04-29 ENCOUNTER — Encounter (HOSPITAL_COMMUNITY)
Admission: RE | Disposition: A | Discharge status: Home / Self Care | Payer: Self-pay | Source: Home / Self Care | Attending: Gastroenterology

## 2019-04-29 ENCOUNTER — Other Ambulatory Visit: Payer: Self-pay

## 2019-04-29 ENCOUNTER — Encounter (HOSPITAL_COMMUNITY): Payer: Self-pay

## 2019-04-29 DIAGNOSIS — K219 Gastro-esophageal reflux disease without esophagitis: Secondary | ICD-10-CM | POA: Diagnosis not present

## 2019-04-29 DIAGNOSIS — Z90710 Acquired absence of both cervix and uterus: Secondary | ICD-10-CM | POA: Insufficient documentation

## 2019-04-29 DIAGNOSIS — L511 Stevens-Johnson syndrome: Secondary | ICD-10-CM | POA: Diagnosis not present

## 2019-04-29 DIAGNOSIS — E119 Type 2 diabetes mellitus without complications: Secondary | ICD-10-CM | POA: Insufficient documentation

## 2019-04-29 DIAGNOSIS — M1990 Unspecified osteoarthritis, unspecified site: Secondary | ICD-10-CM | POA: Insufficient documentation

## 2019-04-29 DIAGNOSIS — Z886 Allergy status to analgesic agent status: Secondary | ICD-10-CM | POA: Insufficient documentation

## 2019-04-29 DIAGNOSIS — K621 Rectal polyp: Secondary | ICD-10-CM | POA: Insufficient documentation

## 2019-04-29 DIAGNOSIS — Z833 Family history of diabetes mellitus: Secondary | ICD-10-CM | POA: Insufficient documentation

## 2019-04-29 DIAGNOSIS — I471 Supraventricular tachycardia: Secondary | ICD-10-CM | POA: Diagnosis not present

## 2019-04-29 DIAGNOSIS — J45909 Unspecified asthma, uncomplicated: Secondary | ICD-10-CM | POA: Diagnosis not present

## 2019-04-29 DIAGNOSIS — Z1211 Encounter for screening for malignant neoplasm of colon: Secondary | ICD-10-CM | POA: Diagnosis not present

## 2019-04-29 DIAGNOSIS — R519 Headache, unspecified: Secondary | ICD-10-CM | POA: Diagnosis not present

## 2019-04-29 DIAGNOSIS — I252 Old myocardial infarction: Secondary | ICD-10-CM | POA: Diagnosis not present

## 2019-04-29 DIAGNOSIS — R32 Unspecified urinary incontinence: Secondary | ICD-10-CM | POA: Diagnosis not present

## 2019-04-29 DIAGNOSIS — F329 Major depressive disorder, single episode, unspecified: Secondary | ICD-10-CM | POA: Insufficient documentation

## 2019-04-29 DIAGNOSIS — K648 Other hemorrhoids: Secondary | ICD-10-CM | POA: Diagnosis not present

## 2019-04-29 DIAGNOSIS — K5730 Diverticulosis of large intestine without perforation or abscess without bleeding: Secondary | ICD-10-CM | POA: Insufficient documentation

## 2019-04-29 DIAGNOSIS — K635 Polyp of colon: Secondary | ICD-10-CM | POA: Diagnosis not present

## 2019-04-29 DIAGNOSIS — Z880 Allergy status to penicillin: Secondary | ICD-10-CM | POA: Insufficient documentation

## 2019-04-29 DIAGNOSIS — D128 Benign neoplasm of rectum: Secondary | ICD-10-CM | POA: Diagnosis not present

## 2019-04-29 DIAGNOSIS — R0600 Dyspnea, unspecified: Secondary | ICD-10-CM | POA: Diagnosis not present

## 2019-04-29 DIAGNOSIS — Z800 Family history of malignant neoplasm of digestive organs: Secondary | ICD-10-CM | POA: Insufficient documentation

## 2019-04-29 DIAGNOSIS — E039 Hypothyroidism, unspecified: Secondary | ICD-10-CM | POA: Diagnosis not present

## 2019-04-29 DIAGNOSIS — I340 Nonrheumatic mitral (valve) insufficiency: Secondary | ICD-10-CM | POA: Diagnosis not present

## 2019-04-29 DIAGNOSIS — K644 Residual hemorrhoidal skin tags: Secondary | ICD-10-CM | POA: Insufficient documentation

## 2019-04-29 DIAGNOSIS — D649 Anemia, unspecified: Secondary | ICD-10-CM | POA: Diagnosis not present

## 2019-04-29 DIAGNOSIS — I10 Essential (primary) hypertension: Secondary | ICD-10-CM | POA: Diagnosis not present

## 2019-04-29 DIAGNOSIS — F419 Anxiety disorder, unspecified: Secondary | ICD-10-CM | POA: Diagnosis not present

## 2019-04-29 DIAGNOSIS — Z7982 Long term (current) use of aspirin: Secondary | ICD-10-CM | POA: Insufficient documentation

## 2019-04-29 DIAGNOSIS — Z794 Long term (current) use of insulin: Secondary | ICD-10-CM | POA: Insufficient documentation

## 2019-04-29 DIAGNOSIS — Z8249 Family history of ischemic heart disease and other diseases of the circulatory system: Secondary | ICD-10-CM | POA: Insufficient documentation

## 2019-04-29 DIAGNOSIS — Z79899 Other long term (current) drug therapy: Secondary | ICD-10-CM | POA: Insufficient documentation

## 2019-04-29 DIAGNOSIS — Q438 Other specified congenital malformations of intestine: Secondary | ICD-10-CM | POA: Diagnosis not present

## 2019-04-29 DIAGNOSIS — Z825 Family history of asthma and other chronic lower respiratory diseases: Secondary | ICD-10-CM | POA: Insufficient documentation

## 2019-04-29 DIAGNOSIS — Z882 Allergy status to sulfonamides status: Secondary | ICD-10-CM | POA: Insufficient documentation

## 2019-04-29 DIAGNOSIS — Z888 Allergy status to other drugs, medicaments and biological substances status: Secondary | ICD-10-CM | POA: Insufficient documentation

## 2019-04-29 DIAGNOSIS — Z809 Family history of malignant neoplasm, unspecified: Secondary | ICD-10-CM | POA: Insufficient documentation

## 2019-04-29 DIAGNOSIS — Z96643 Presence of artificial hip joint, bilateral: Secondary | ICD-10-CM | POA: Insufficient documentation

## 2019-04-29 DIAGNOSIS — Z8371 Family history of colonic polyps: Secondary | ICD-10-CM | POA: Insufficient documentation

## 2019-04-29 HISTORY — PX: POLYPECTOMY: SHX5525

## 2019-04-29 HISTORY — PX: COLONOSCOPY WITH PROPOFOL: SHX5780

## 2019-04-29 LAB — GLUCOSE, CAPILLARY
Glucose-Capillary: 214 mg/dL — ABNORMAL HIGH (ref 70–99)
Glucose-Capillary: 166 mg/dL — ABNORMAL HIGH (ref 70–99)

## 2019-04-29 SURGERY — COLONOSCOPY WITH PROPOFOL
Anesthesia: General

## 2019-04-29 MED ORDER — PROPOFOL 10 MG/ML IV BOLUS
INTRAVENOUS | Status: AC
  Filled 2019-04-29: qty 80

## 2019-04-29 MED ORDER — CHLORHEXIDINE GLUCONATE CLOTH 2 % EX PADS: 6.0000 | MEDICATED_PAD | Freq: Once | CUTANEOUS | Status: DC

## 2019-04-29 MED ORDER — PROPOFOL 10 MG/ML IV BOLUS
INTRAVENOUS | Status: DC | PRN
  Administered 2019-04-29: 80 mg via INTRAVENOUS
  Administered 2019-04-29: 20 mg via INTRAVENOUS

## 2019-04-29 MED ORDER — LACTATED RINGERS IV SOLN
Freq: Once | INTRAVENOUS | Status: AC
  Administered 2019-04-29: 07:00:00 via INTRAVENOUS

## 2019-04-29 MED ORDER — LACTATED RINGERS IV SOLN
INTRAVENOUS | Status: DC | PRN
  Administered 2019-04-29: 09:00:00 via INTRAVENOUS

## 2019-04-29 MED ORDER — PROPOFOL 500 MG/50ML IV EMUL
INTRAVENOUS | Status: DC | PRN
  Administered 2019-04-29: 150 ug/kg/min via INTRAVENOUS

## 2019-04-29 MED ORDER — LIDOCAINE HCL (CARDIAC) PF 100 MG/5ML IV SOSY
PREFILLED_SYRINGE | INTRAVENOUS | Status: DC | PRN
  Administered 2019-04-29: 50 mg via INTRAVENOUS

## 2019-04-29 NOTE — H&P (Signed)
Primary Care Physician:  Terald Sleeper, PA-C Primary Gastroenterologist:  Dr. Oneida Alar  Pre-Procedure History & Physical: HPI:  [name] is a 68 y.o. female here for [place].  Past Medical History:  Diagnosis Date  . Allergic rhinitis   . Allergy   . Anemia   . Ankle swelling   . Anxiety    takes Xanax daily   . Arthritis   . Asthma   . Blurred vision, bilateral   . Chronic kidney disease   . Depression    takes Celexa daily  . Diabetes mellitus without complication ([place])    type II; takes Actos daily  . GERD (gastroesophageal reflux disease)   . Gout   . Headache   . Heart murmur   . Heart palpitations   . Hemorrhoids   . History of blood transfusion    no abnormal reaction noted  . History of bronchitis   . Hyperlipidemia   . Hypertension    takes Catapres and Quinapril daily  . Hypothyroidism    takes Synthroid daily  . Moderate mitral insufficiency    a. 12/2014 Echo: EF 55-60%, mod MR, mildly to moderately dil LA, PASP 58mmHg.  . Myocardial infarction ([place])    " Minor"  . Osteoarthritis   . Paroxysmal supraventricular tachycardia ([place])   . Recurrent ventral incisional hernia   . Ringing in ears   . Shortness of breath dyspnea    ALbuterol daily as needed  . Stevens-Johnson disease ([place])   . Urinary incontinence   . Wears glasses     Past Surgical History:  Procedure Laterality Date  . ABDOMINAL HYSTERECTOMY    . BOWEL RESECTION    . COLON SURGERY    . COLONOSCOPY    . ESOPHAGOGASTRODUODENOSCOPY    . [place]  . HERNIA REPAIR     hiatal hernia repair  . [place]   pt is unsure of exact date but thinks it was early 28's  . INCISIONAL HERNIA REPAIR N/A 04/02/2018   Procedure: OPEN VENTRAL HERNIA REPAIR WITH MESH, Explantation of hernia mesh;  Surgeon: Donnie Mesa, MD;  Location: [facility];  Service: General;  Laterality: N/A;  . INSERTION OF MESH N/A 04/02/2018   Procedure: INSERTION OF MESH;   Surgeon: Donnie Mesa, MD;  Location: [facility];  Service: General;  Laterality: N/A;  . TOTAL HIP ARTHROPLASTY Left 02/02/2015   Procedure: LEFT TOTAL HIP ARTHROPLASTY;  Surgeon: Garald Balding, MD;  Location: [facility];  Service: Orthopedics;  Laterality: Left;  . TOTAL HIP ARTHROPLASTY Right 02/01/2016   Procedure: TOTAL HIP ARTHROPLASTY;  Surgeon: Garald Balding, MD;  Location: [facility];  Service: Orthopedics;  Laterality: Right;    Prior to Admission medications   Medication Sig Start Date End Date Taking? Authorizing Provider  acetaminophen (TYLENOL) 500 MG tablet Take 1,000 mg by mouth every 8 (eight) hours as needed (pain).    Yes [provider]  albuterol (VENTOLIN HFA) 108 (90 Base) MCG/ACT inhaler Inhale 2 puffs into the lungs every 6 (six) hours as needed for wheezing or shortness of breath. 11/25/18  Yes Terald Sleeper, PA-C  atorvastatin (LIPITOR) 20 MG tablet Take 1 tablet (20 mg total) by mouth daily. 10/30/18  Yes Terald Sleeper, PA-C  cetirizine (ZYRTEC) 10 MG tablet Take 20 mg by mouth daily.   Yes [provider]  citalopram (CELEXA) 20 MG tablet Take 1 tablet (20 mg total) by mouth daily.  09/12/18  Yes Terald Sleeper, PA-C  cloNIDine (CATAPRES) 0.1 MG tablet Take 1 tablet (0.1 mg total) by mouth daily. Patient taking differently: Take 0.1 mg by mouth at bedtime.  10/30/18  Yes Terald Sleeper, PA-C  docusate sodium (COLACE) 100 MG capsule Take 100 mg by mouth 2 (two) times daily.   Yes [provider]  empagliflozin (JARDIANCE) 10 MG TABS tablet Take 10 mg by mouth daily before breakfast. 02/14/19  Yes Hawks, Christy A, FNP  fluticasone (FLONASE) 50 MCG/ACT nasal spray Place 1 spray into both nostrils 2 (two) times daily as needed for allergies or rhinitis.   Yes [provider]  furosemide (LASIX) 40 MG tablet Take 1 tablet (40 mg total) by mouth daily. 10/30/18  Yes Terald Sleeper, PA-C  gabapentin (NEURONTIN) 100 MG capsule Take 1 capsule (100 mg  total) by mouth 3 (three) times daily. 02/14/19  Yes Hawks, Christy A, FNP  glucose blood (ONE TOUCH ULTRA TEST) test strip TEST TWICE A DAY 08/30/18  Yes Ronnald Ramp, Angel S, PA-C  HUMALOG 100 UNIT/ML injection INJECT 10 UNITS SUBCUTANEOUSLY INTO SKIN DAILY Patient taking differently: Inject 4-10 Units into the skin 3 (three) times daily with meals. Sliding Scale 11/22/18  Yes Terald Sleeper, PA-C  hydrOXYzine (ATARAX/VISTARIL) 25 MG tablet TAKE 1 TABLET (25 MG TOTAL) BY MOUTH 3 (THREE) TIMES DAILY. TAKE ONE AT BEDTIME Patient taking differently: Take 25 mg by mouth 3 (three) times daily.  03/07/19  Yes Terald Sleeper, PA-C  insulin glargine (LANTUS) 100 UNIT/ML injection INJECT (25-30 UNITS TOTAL) INTO THE SKIN DAILY. Patient taking differently: Inject 25-30 Units into the skin at bedtime.  03/14/19  Yes Terald Sleeper, PA-C  Insulin Syringe-Needle U-100 (BD INSULIN SYRINGE U/F) 31G X 5/16" 0.5 ML MISC USE TO INJECT LANTUS AND NOVOLOG DAILY 02/01/18  Yes Terald Sleeper, PA-C  Iron-Vitamin C (VITRON-C) 65-125 MG TABS Take 1 tablet by mouth daily.   Yes [provider]  KLOR-CON M20 20 MEQ tablet TAKE 2 TABLETS BY MOUTH IN IN THE MORNING WITH FOOD & A FULL GLASS OF LIQUID. DO NOT CRUSH Patient taking differently: Take 40 mEq by mouth daily.  11/14/18  Yes Terald Sleeper, PA-C  levothyroxine (SYNTHROID) 88 MCG tablet TAKE 1 TABLET BY MOUTH EVERY DAY 04/21/19  Yes Hawks, Christy A, FNP  Magnesium 400 MG TABS Take 400 mg by mouth daily.   Yes [provider]  magnesium hydroxide (PHILLIPS MILK OF MAGNESIA) 400 MG/5ML suspension Take 60 mLs by mouth See admin instructions. Every 3 days - [name]   Yes [provider]  Menthol, Topical Analgesic, (BIOFREEZE COLORLESS) 4 % GEL Apply 1 application topically daily as needed (pain). Foot, Legs and Knees   Yes [provider]  metFORMIN (GLUCOPHAGE) 500 MG tablet Take 1 tablet (500 mg total) by mouth 2 (two) times daily with a meal.  10/30/18  Yes Terald Sleeper, PA-C  metoprolol succinate (TOPROL XL) 25 MG 24 hr tablet Take 1 tablet (25 mg total) by mouth daily. 10/30/18  Yes Terald Sleeper, PA-C  montelukast (SINGULAIR) 10 MG tablet TAKE 1 TABLET BY MOUTH ONCE DAILY IN THE EVENING Patient taking differently: Take 10 mg by mouth at bedtime.  10/30/18  Yes Terald Sleeper, PA-C  Multiple Vitamins-Minerals (MULTIVITAMIN WITH MINERALS) tablet Take 1 tablet by mouth daily. Centrum Silver   Yes [provider]  pantoprazole (PROTONIX) 40 MG tablet Take 1 tablet (40 mg total) by mouth  daily. 06/14/18  Yes Herminio Commons, MD  Propylene Glycol (SYSTANE BALANCE OP) Place 1 drop into both eyes daily as needed (Dry eyes).   Yes [provider]  quinapril (ACCUPRIL) 5 MG tablet TAKE 2 TABLETS BY MOUTH EVERY DAY Patient taking differently: Take 10 mg by mouth daily.  03/17/19  Yes Terald Sleeper, PA-C  umeclidinium-vilanterol (ANORO ELLIPTA) 62.5-25 MCG/INH AEPB Inhale 1 puff into the lungs daily. 10/30/18  Yes Terald Sleeper, PA-C  Vitamin D, Ergocalciferol, (DRISDOL) 1.25 MG (50000 UT) CAPS capsule TAKE 1 CAPSULE BY MOUTH ON [place] Patient taking differently: Take 50,000 Units by mouth 2 (two) times a week. Tuesday and Friday 03/03/19  Yes Terald Sleeper, PA-C    Allergies as of 02/12/2019 - Review Complete 01/28/2019  Allergen Reaction Noted  . Other Itching, Nausea Only, and Other (See Comments) 12/25/2014  . Aspirin Other (See Comments) 03/29/2018  . Allopurinol Rash 12/25/2014  . Penicillins Rash 12/25/2014  . Sulfa antibiotics Rash 12/25/2014    Family History  Problem Relation Age of Onset  . Heart attack Mother   . Kidney disease Mother   . Diabetes Mother   . Hypertension Mother   . Colon polyps Mother   . Cancer Father        Widespread; unknown primary  . COPD Father   . Hypertension Sister   . Hyperlipidemia Sister   . Colon cancer Maternal Aunt   . Colon cancer Paternal  Aunt     Social History   Socioeconomic History  . Marital status: Single    Spouse name: Not on file  . Number of children: 0  . Years of education: 28  . Highest education level: High school graduate  Occupational History  . Occupation: retired  Scientific laboratory technician  . Financial resource strain: Not hard at all  . Food insecurity    Worry: Never true    Inability: Never true  . Transportation needs    Medical: No    Non-medical: No  Tobacco Use  . Smoking status: Never Smoker  . Smokeless tobacco: Never Used  Substance and Sexual Activity  . Alcohol use: No    Alcohol/week: 0.0 standard drinks  . Drug use: No  . Sexual activity: Not Currently    Birth control/protection: Surgical  Lifestyle  . Physical activity    Days per week: 0 days    Minutes per session: 0 min  . Stress: To some extent  Relationships  . Social connections    Talks on phone: More than three times a week    Gets together: Twice a week    Attends religious service: More than 4 times per year    Active member of club or organization: Yes    Attends meetings of clubs or organizations: More than 4 times per year    Relationship status: Never married  . Intimate partner violence    Fear of current or ex partner: No    Emotionally abused: No    Physically abused: No    Forced sexual activity: No  Other Topics Concern  . Not on file  Social History Narrative  . Not on file    Review of Systems: See HPI, otherwise negative ROS   Physical Exam: BP (!) 152/88   Pulse 94   Resp 18   Ht 5\' 4"  (1.626 m)   Wt 83.9 kg   SpO2 98%   BMI 31.75 kg/m  General:  Alert,  pleasant and cooperative in NAD Head:  Normocephalic and atraumatic. Neck:  Supple; Lungs:  Clear throughout to auscultation.    Heart:  Regular rate and rhythm. Abdomen:  Soft, nontender and nondistended. Normal bowel sounds, without guarding, and without rebound.   Neurologic:  Alert and  oriented x4;  grossly normal  neurologically.  Impression/Plan:    SCREENING  Plan:  1. TCS TODAY DISCUSSED PROCEDURE, BENEFITS, & RISKS: < 1% chance of medication reaction, bleeding, perforation, ASPIRATION, or rupture of spleen/liver requiring surgery to fix it and missed polyps < 1 cm 10-20% of the time.

## 2019-04-29 NOTE — Op Note (Signed)
[facility] Patient Name: [name] Procedure Date: 04/29/2019 8:39 AM MRN: [id number] Date of Birth: July 09, 1950 Attending MD: Barney Drain MD, MD CSN: [id number] Age: 68 Admit Type: Outpatient Procedure:                Colonoscopy WITH COLD SNARE POLYPECTOMY Indications:              Screening for colorectal malignant neoplasm Providers:                Barney Drain MD, MD, Charlsie Quest. Theda Sers RN, RN,                            Nelma Rothman, Technician Referring MD:             Terald Sleeper Medicines:                Monitored Anesthesia Care Complications:            No immediate complications. Estimated Blood Loss:     Estimated blood loss was minimal. Procedure:                Pre-Anesthesia Assessment:                           - Prior to the procedure, a History and Physical                            was performed, and patient medications and                            allergies were reviewed. The patient's tolerance of                            previous anesthesia was also reviewed. The risks                            and benefits of the procedure and the sedation                            options and risks were discussed with the patient.                            All questions were answered, and informed consent                            was obtained. Prior Anticoagulants: The patient has                            taken no previous anticoagulant or antiplatelet                            agents. ASA Grade Assessment: II - A patient with                            mild systemic disease. After reviewing the risks  and benefits, the patient was deemed in                            satisfactory condition to undergo the procedure.                            After obtaining informed consent, the colonoscope                            was passed under direct vision. Throughout the                            procedure, the patient's blood pressure,  pulse, and                            oxygen saturations were monitored continuously. The                            [id number]-[id number] ([id number]) scope was introduced through                            the anus and advanced to the the cecum, identified                            by appendiceal orifice and ileocecal valve. The                            colonoscopy was somewhat difficult due to a                            tortuous colon. Successful completion of the                            procedure was aided by straightening and shortening                            the scope to obtain bowel loop reduction and                            COLOWRAP. The patient tolerated the procedure well.                            The quality of the bowel preparation was excellent.                            The ileocecal valve, appendiceal orifice, and                            rectum were photographed. Scope In: 9:03:40 AM Scope Out: 9:26:22 AM Scope Withdrawal Time: 0 hours 12 minutes 3 seconds  Total Procedure Duration: 0 hours 22 minutes 42 seconds  Findings:      A 4 mm polyp was found in the rectum. The polyp was sessile. The polyp  was removed with a cold snare. Resection and retrieval were complete.      Multiple small and large-mouthed diverticula were found in the       recto-sigmoid colon and sigmoid colon.      External and internal hemorrhoids were found.      The recto-sigmoid colon, sigmoid colon and descending colon were grossly       tortuous.      -FIXED ANGULATED/REDUNDANT RECTOSIGMOID COLON Impression:               - One 4 mm polyp in the rectum, removed with a cold                            snare. Resected and retrieved.                           - Diverticulosis in the recto-sigmoid colon and in                            the sigmoid colon.                           - External and internal hemorrhoids.                           - Tortuous colon. Moderate Sedation:      Per  Anesthesia Care Recommendation:           - Patient has a contact number available for                            emergencies. The signs and symptoms of potential                            delayed complications were discussed with the                            patient. Return to normal activities tomorrow.                            Written discharge instructions were provided to the                            patient.                           - High fiber diet.                           - Continue present medications.                           - Await pathology results.                           - Repeat colonoscopy in 5-10 years for surveillance  WITH MAC/ULTRASLIM COLONOSCOPE. Procedure Code(s):        --- Professional ---                           [phone], Colonoscopy, flexible; with removal of                            tumor(s), polyp(s), or other lesion(s) by snare                            technique Diagnosis Code(s):        --- Professional ---                           Z12.11, Encounter for screening for malignant                            neoplasm of colon                           K62.1, Rectal polyp                           K64.8, Other hemorrhoids                           K57.30, Diverticulosis of large intestine without                            perforation or abscess without bleeding                           Q43.8, Other specified congenital malformations of                            intestine CPT copyright 2019 [employer]. All rights reserved. The codes documented in this report are preliminary and upon coder review may  be revised to meet current compliance requirements. Barney Drain, MD Barney Drain MD, MD 04/29/2019 9:41:37 AM This report has been signed electronically. Number of Addenda: 0

## 2019-04-29 NOTE — Discharge Instructions (Signed)
You have small internal hemorrhoids and diverticulosis IN YOUR LEFT COLON. YOU HAD ONE SMALL RECTAL POLYP REMOVED.   EAT TO LIVE AND THINK OF FOOD AS MEDICINE. 75% OF YOUR PLATE SHOULD BE FRUITS/VEGGIES.  To have more energy, and to lose weight:      1. CONTINUE YOUR WEIGHT LOSS EFFORTS.     2. If you must eat bread, EAT EZEKIEL BREAD. IT IS IN THE FROZEN SECTION OF THE GROCERY STORE.    3. DRINK WATER WITH FRUIT OR CUCUMBER ADDED. YOUR URINE SHOULD BE LIGHT YELLOW. AVOID SODA, GATORADE, ENERGY DRINKS, OR DIET SODA.     4. AVOID HIGH FRUCTOSE CORN SYRUP AND CAFFEINE.     5. DO NOT chew SUGAR FREE GUM OR USE ARTIFICIAL SWEETENERS. IF NEEDED USE STEVIA AS A SWEETENER.    6. DO NOT EAT ENRICHED WHEAT FLOUR, PASTA, RICE, OR CEREAL.    7. ONLY EAT WILD CAUGHT SEAFOOD, GRASS FED BEEF OR CHICKEN, PORK FROM PASTURE RAISE PIGS, OR EGGS FROM PASTURE RAISED CHICKENS.    8. PRACTICE CHAIR YOGA FOR 15-30 MINS 3 OR 4 TIMES A WEEK AND PROGRESS TO HATHA YOGA OVER NEXT 6 MOS.     ADDITIONAL SUPPLEMENTS TO DECREASE CRAVING AND SUPPRESS YOUR APPETITE:    1. CINNAMON 500 MG EVERY AM PRIOR TO FIRST MEAL.   **STABILIZES BLOOD GLUCOSE/REDUCES CRAVINGS**    2. CHROMIUM 400-500 MG WITH MEALS TWICE DAILY.    **FAT BURNER**    3. GREEN TEA EXTRACT ONE DAILY.   **FAT BURNER/SUPPRESSES YOUR APPETITE**    4. ALPHA LIPOIC ACID TWICE DAILY.   **NATURAL ANTI-INFLAMMATORY SUPPLEMENT THAT IS AN ALTERNATIVE TO IBUPROFEN OR NAPROXEN**   Please CALL or SEND me A MY CHART MESSAGE IF YOU HAVE QUESTIONS OR CONCERNS.   USE PREPARATION H FOUR TIMES A DAY IF NEEDED TO RELIEVE RECTAL PAIN/PRESSURE/BLEEDING.  Next colonoscopy in 5-10 years.  Colonoscopy Care After Read the instructions outlined below and refer to this sheet in the next week. These discharge instructions provide you with general information on caring for yourself after you leave the hospital. While your treatment has been planned according to  the most current medical practices available, unavoidable complications occasionally occur. If you have any problems or questions after discharge, call DR. FIELDS, [phone].  ACTIVITY  You may resume your regular activity, but move at a slower pace for the next 24 hours.   Take frequent rest periods for the next 24 hours.   Walking will help get rid of the air and reduce the bloated feeling in your belly (abdomen).   No driving for 24 hours (because of the medicine (anesthesia) used during the test).   You may shower.   Do not sign any important legal documents or operate any machinery for 24 hours (because of the anesthesia used during the test).    NUTRITION  Drink plenty of fluids.   You may resume your normal diet as instructed by your doctor.   Begin with a light meal and progress to your normal diet. Heavy or fried foods are harder to digest and may make you feel sick to your stomach (nauseated).   Avoid alcoholic beverages for 24 hours or as instructed.    MEDICATIONS  You may resume your normal medications.   WHAT YOU CAN EXPECT TODAY  Some feelings of bloating in the abdomen.   Passage of more gas than usual.   Spotting of blood in your stool or on the toilet paper  .  IF YOU HAD POLYPS REMOVED DURING THE COLONOSCOPY:  Eat a soft diet IF YOU HAVE NAUSEA, BLOATING, ABDOMINAL PAIN, OR VOMITING.    FINDING OUT THE RESULTS OF YOUR TEST Not all test results are available during your visit. DR. Oneida Alar WILL CALL YOU WITHIN 14 DAYS OF YOUR PROCEDUE WITH YOUR RESULTS. Do not assume everything is normal if you have not heard from DR. FIELDS, CALL HER OFFICE AT [phone].  SEEK IMMEDIATE MEDICAL ATTENTION AND CALL THE OFFICE: [phone] IF:  You have more than a spotting of blood in your stool.   Your belly is swollen (abdominal distention).   You are nauseated or vomiting.   You have a temperature over 101F.   You have abdominal pain or discomfort  that is severe or gets worse throughout the day.  High-Fiber Diet A high-fiber diet changes your normal diet to include more whole grains, legumes, fruits, and vegetables. Changes in the diet involve replacing refined carbohydrates with unrefined foods. The calorie level of the diet is essentially unchanged. The Dietary Reference Intake (recommended amount) for adult males is 38 grams per day. For adult females, it is 25 grams per day. Pregnant and lactating women should consume 28 grams of fiber per day. Fiber is the intact part of a plant that is not broken down during digestion. Functional fiber is fiber that has been isolated from the plant to provide a beneficial effect in the body.  PURPOSE Increase stool bulk.  Ease and regulate bowel movements.  Lower cholesterol.  REDUCE RISK OF COLON CANCER  INDICATIONS THAT YOU NEED MORE FIBER Constipation and hemorrhoids.  Uncomplicated diverticulosis (intestine condition) and irritable bowel syndrome.  Weight management.  As a protective measure against hardening of the arteries (atherosclerosis), diabetes, and cancer.   GUIDELINES FOR INCREASING FIBER IN THE DIET Start adding fiber to the diet slowly. A gradual increase of about 5 more grams (2 servings of most fruits or vegetables) per day is best. Too rapid an increase in fiber may result in constipation, flatulence, and bloating.  Drink enough water and fluids to keep your urine clear or pale yellow. Water, juice, or caffeine-free drinks are recommended. Not drinking enough fluid may cause constipation.  Eat a variety of high-fiber foods rather than one type of fiber.  Try to increase your intake of fiber through using high-fiber foods rather than fiber pills or supplements that contain small amounts of fiber.  The goal is to change the types of food eaten. Do not supplement your present diet with high-fiber foods, but replace foods in your present diet.    Polyps, Colon  A polyp is extra  tissue that grows inside your body. Colon polyps grow in the large intestine. The large intestine, also called the colon, is part of your digestive system. It is a long, hollow tube at the end of your digestive tract where your body makes and stores stool. Most polyps are not dangerous. They are benign. This means they are not cancerous. But over time, some types of polyps can turn into cancer. Polyps that are smaller than a pea are usually not harmful. But larger polyps could someday become or may already be cancerous. To be safe, doctors remove all polyps and test them.   PREVENTION There is not one sure way to prevent polyps. You might be able to lower your risk of getting them if you:  Eat more fruits and vegetables and less fatty food.   Do not smoke.   Avoid alcohol.  Exercise every day.   Lose weight if you are overweight.   Eating more calcium and folate can also lower your risk of getting polyps. Some foods that are rich in calcium are milk, cheese, and broccoli. Some foods that are rich in folate are chickpeas, kidney beans, and spinach.    Diverticulosis Diverticulosis is a common condition that develops when small pouches (diverticula) form in the wall of the colon. The risk of diverticulosis increases with age. It happens more often in people who eat a low-fiber diet. Most individuals with diverticulosis have no symptoms. Those individuals with symptoms usually experience belly (abdominal) pain, constipation, or loose stools (diarrhea).  HOME CARE INSTRUCTIONS  Increase the amount of fiber in your diet as directed by your caregiver or dietician. This may reduce symptoms of diverticulosis.   Drink at least 6 to 8 glasses of water each day to prevent constipation.   Try not to strain when you have a bowel movement.   Avoiding nuts and seeds to prevent complications is NOT NECESSARY.   FOODS HAVING HIGH FIBER CONTENT INCLUDE:  Fruits. Apple, peach, pear, tangerine,  raisins, prunes.   Vegetables. Brussels sprouts, asparagus, broccoli, cabbage, carrot, cauliflower, romaine lettuce, spinach, summer squash, tomato, winter squash, zucchini.   Starchy Vegetables. Baked beans, kidney beans, lima beans, split peas, lentils, potatoes (with skin).     SEEK IMMEDIATE MEDICAL CARE IF:  You develop increasing pain or severe bloating.   You have an oral temperature above 101F.   You develop vomiting or bowel movements that are bloody or black.

## 2019-04-29 NOTE — Anesthesia Postprocedure Evaluation (Signed)
Anesthesia Post Note  Patient: [name]  Procedure(s) Performed: COLONOSCOPY WITH PROPOFOL (N/A )  Patient location during evaluation: PACU Anesthesia Type: General Level of consciousness: awake and alert Pain management: pain level controlled Vital Signs Assessment: post-procedure vital signs reviewed and stable Respiratory status: spontaneous breathing, nonlabored ventilation, respiratory function stable and patient connected to nasal cannula oxygen Cardiovascular status: blood pressure returned to baseline and stable Postop Assessment: no apparent nausea or vomiting Anesthetic complications: no     Last Vitals:  Vitals:   04/29/19 0715  BP: (!) 152/88  Pulse: 94  Resp: 18  SpO2: 98%    Last Pain:  Vitals:   04/29/19 0830  TempSrc:   PainSc: 0-No pain                 Talitha Givens

## 2019-04-29 NOTE — Transfer of Care (Signed)
Immediate Anesthesia Transfer of Care Note  Patient: [name]  Procedure(s) Performed: COLONOSCOPY WITH PROPOFOL (N/A )  Patient Location: PACU  Anesthesia Type:General  Level of Consciousness: awake, alert  and oriented  Airway & Oxygen Therapy: Patient Spontanous Breathing  Post-op Assessment: Report given to RN, Post -op Vital signs reviewed and stable and Patient moving all extremities X 4  Post vital signs: Reviewed and stable  Last Vitals:  Vitals Value Taken Time  BP 120/55 04/29/19 0932  Temp    Pulse 77 04/29/19 0935  Resp 11 04/29/19 0935  SpO2 98 % 04/29/19 0935  Vitals shown include unvalidated device data.  Last Pain:  Vitals:   04/29/19 0830  TempSrc:   PainSc: 0-No pain         Complications: No apparent anesthesia complications

## 2019-04-30 LAB — SURGICAL PATHOLOGY

## 2019-05-01 ENCOUNTER — Encounter (HOSPITAL_COMMUNITY): Payer: Self-pay | Admitting: Gastroenterology

## 2019-05-02 ENCOUNTER — Encounter: Payer: Self-pay | Admitting: Physician Assistant

## 2019-05-02 ENCOUNTER — Telehealth: Payer: Self-pay | Admitting: Gastroenterology

## 2019-05-02 ENCOUNTER — Ambulatory Visit (INDEPENDENT_AMBULATORY_CARE_PROVIDER_SITE_OTHER): Payer: Medicare Other | Admitting: Physician Assistant

## 2019-05-02 DIAGNOSIS — I10 Essential (primary) hypertension: Secondary | ICD-10-CM | POA: Diagnosis not present

## 2019-05-02 DIAGNOSIS — E1149 Type 2 diabetes mellitus with other diabetic neurological complication: Secondary | ICD-10-CM | POA: Diagnosis not present

## 2019-05-02 DIAGNOSIS — E1065 Type 1 diabetes mellitus with hyperglycemia: Secondary | ICD-10-CM

## 2019-05-02 DIAGNOSIS — E039 Hypothyroidism, unspecified: Secondary | ICD-10-CM | POA: Diagnosis not present

## 2019-05-02 LAB — BAYER DCA HB A1C WAIVED: HB A1C (BAYER DCA - WAIVED): 7.4 % — ABNORMAL HIGH (ref <7.0)

## 2019-05-02 MED ORDER — "INSULIN SYRINGE-NEEDLE U-100 31G X 5/16"" 0.5 ML MISC": 11 refills | Status: DC

## 2019-05-02 MED ORDER — ATORVASTATIN CALCIUM 20 MG PO TABS: 20.0000 mg | ORAL_TABLET | Freq: Every day | ORAL | 3 refills | Status: DC

## 2019-05-02 MED ORDER — INSULIN GLARGINE 100 UNIT/ML ~~LOC~~ SOLN: SUBCUTANEOUS | 11 refills | Status: DC

## 2019-05-02 MED ORDER — INSULIN LISPRO 100 UNIT/ML ~~LOC~~ SOLN: 4.0000 [IU] | Freq: Three times a day (TID) | SUBCUTANEOUS | 5 refills | Status: DC

## 2019-05-02 MED ORDER — GABAPENTIN 100 MG PO CAPS: 200.0000 mg | ORAL_CAPSULE | Freq: Three times a day (TID) | ORAL | 5 refills | Status: DC

## 2019-05-02 NOTE — Telephone Encounter (Signed)
PLEASE CALL PT. SHE HAD ONE SIMPLE ADENOMA REMOVED.   DRINK WATER TO KEEP YOUR URINE LIGHT YELLOW. FOLLOW A HIGH FIBER DIET.  NEXT COLONOSCOPY BETWEEN AGE 68 AND 75.

## 2019-05-03 LAB — CBC WITH DIFFERENTIAL/PLATELET
Basophils Absolute: 0 10*3/uL (ref 0.0–0.2)
Basos: 0 %
EOS (ABSOLUTE): 0.2 10*3/uL (ref 0.0–0.4)
Eos: 2 %
Hematocrit: 40.2 % (ref 34.0–46.6)
Hemoglobin: 12.9 g/dL (ref 11.1–15.9)
Immature Grans (Abs): 0 10*3/uL (ref 0.0–0.1)
Immature Granulocytes: 0 %
Lymphocytes Absolute: 2.1 10*3/uL (ref 0.7–3.1)
Lymphs: 25 %
MCH: 23.7 pg — ABNORMAL LOW (ref 26.6–33.0)
MCHC: 32.1 g/dL (ref 31.5–35.7)
MCV: 74 fL — ABNORMAL LOW (ref 79–97)
Monocytes Absolute: 0.6 10*3/uL (ref 0.1–0.9)
Monocytes: 6 %
Neutrophils Absolute: 5.7 10*3/uL (ref 1.4–7.0)
Neutrophils: 67 %
Platelets: 294 10*3/uL (ref 150–450)
RBC: 5.44 x10E6/uL — ABNORMAL HIGH (ref 3.77–5.28)
RDW: 13.1 % (ref 11.7–15.4)
WBC: 8.6 10*3/uL (ref 3.4–10.8)

## 2019-05-03 LAB — CMP14+EGFR
ALT: 13 IU/L (ref 0–32)
AST: 14 IU/L (ref 0–40)
Albumin/Globulin Ratio: 1.5 (ref 1.2–2.2)
Albumin: 4.4 g/dL (ref 3.8–4.8)
Alkaline Phosphatase: 237 IU/L — ABNORMAL HIGH (ref 39–117)
BUN/Creatinine Ratio: 16 (ref 12–28)
BUN: 20 mg/dL (ref 8–27)
Bilirubin Total: 0.4 mg/dL (ref 0.0–1.2)
CO2: 21 mmol/L (ref 20–29)
Calcium: 10.2 mg/dL (ref 8.7–10.3)
Chloride: 101 mmol/L (ref 96–106)
Creatinine, Ser: 1.26 mg/dL — ABNORMAL HIGH (ref 0.57–1.00)
GFR calc Af Amer: 51 mL/min/{1.73_m2} — ABNORMAL LOW (ref >59)
GFR calc non Af Amer: 44 mL/min/{1.73_m2} — ABNORMAL LOW (ref >59)
Globulin, Total: 3 g/dL (ref 1.5–4.5)
Glucose: 228 mg/dL — ABNORMAL HIGH (ref 65–99)
Potassium: 4.5 mmol/L (ref 3.5–5.2)
Sodium: 139 mmol/L (ref 134–144)
Total Protein: 7.4 g/dL (ref 6.0–8.5)

## 2019-05-03 LAB — LIPID PANEL
Chol/HDL Ratio: 4 ratio (ref 0.0–4.4)
Cholesterol, Total: 156 mg/dL (ref 100–199)
HDL: 39 mg/dL — ABNORMAL LOW (ref >39)
LDL Chol Calc (NIH): 89 mg/dL (ref 0–99)
Triglycerides: 158 mg/dL — ABNORMAL HIGH (ref 0–149)
VLDL Cholesterol Cal: 28 mg/dL (ref 5–40)

## 2019-05-03 LAB — TSH: TSH: 0.029 u[IU]/mL — ABNORMAL LOW (ref 0.450–4.500)

## 2019-05-05 ENCOUNTER — Other Ambulatory Visit: Payer: Self-pay | Admitting: Physician Assistant

## 2019-05-05 DIAGNOSIS — E1065 Type 1 diabetes mellitus with hyperglycemia: Secondary | ICD-10-CM

## 2019-05-05 DIAGNOSIS — E039 Hypothyroidism, unspecified: Secondary | ICD-10-CM

## 2019-05-05 MED ORDER — LEVOTHYROXINE SODIUM 75 MCG PO TABS: 88.0000 ug | ORAL_TABLET | Freq: Every day | ORAL | 1 refills | Status: DC

## 2019-05-05 NOTE — Telephone Encounter (Signed)
Reminder in epic °

## 2019-05-06 NOTE — Telephone Encounter (Signed)
Letter with results mailed to pt.

## 2019-05-11 NOTE — Progress Notes (Signed)
Telephone visit  Subjective: BZ:JIRCVEL medication conditions PCP: Terald Sleeper, PA-C [id number] [name] is a 68 y.o. female calls for telephone consult today. Patient provides verbal consent for consult held via phone.  Patient is identified with 2 separate identifiers.  At this time the entire area is on COVID-19 social distancing and stay home orders are in place.  Patient is of higher risk and therefore we are performing this by a virtual method.  Location of patient: home Location of provider: HOME Others present for call: no  The patient notes that she is having a recheck for her chronic medical conditions which do include hypertension, hypothyroidism, diabetes.  A lab order will be placed and she will come in soon to recheck all of her labs.  She does need refills on some of her medication.  She states that her fasting blood sugars have been anywhere from 114-150.  Also her last A1c in July was 8.2.  She has been trying to really watch her intake.  She states that overall she is doing well.  Her joints are doing well.  And she has no new complaints.   ROS: Per HPI  Allergies  Allergen Reactions  . Other Itching, Nausea Only and Other (See Comments)    Acidic foods cause stomach upset and itching  . Aspirin Other (See Comments)    Can only take 81 - full strength will give acid reflux  . Allopurinol Rash  . Penicillins Rash    Has patient had a PCN reaction causing immediate rash, facial/tongue/throat swelling, SOB or lightheadedness with hypotension: unkn Has patient had a PCN reaction causing severe rash involving mucus membranes or skin necrosis: unkn Has patient had a PCN reaction that required hospitalization: unkn Has patient had a PCN reaction occurring within the last 10 years: unkn If all of the above answers are "NO", then may proceed with Cephalosporin use.   . Sulfa Antibiotics Rash   Past Medical History:  Diagnosis Date  . Allergic rhinitis   . Allergy    . Anemia   . Ankle swelling   . Anxiety    takes Xanax daily   . Arthritis   . Asthma   . Blurred vision, bilateral   . Chronic kidney disease   . Depression    takes Celexa daily  . Diabetes mellitus without complication ([place])    type II; takes Actos daily  . GERD (gastroesophageal reflux disease)   . Gout   . Headache   . Heart murmur   . Heart palpitations   . Hemorrhoids   . History of blood transfusion    no abnormal reaction noted  . History of bronchitis   . Hyperlipidemia   . Hypertension    takes Catapres and Quinapril daily  . Hypothyroidism    takes Synthroid daily  . Moderate mitral insufficiency    a. 12/2014 Echo: EF 55-60%, mod MR, mildly to moderately dil LA, PASP 68mHg.  . Myocardial infarction ([place]    " Minor"  . Osteoarthritis   . Paroxysmal supraventricular tachycardia ([place]   . Recurrent ventral incisional hernia   . Ringing in ears   . Shortness of breath dyspnea    ALbuterol daily as needed  . Stevens-Johnson disease ([place]   . Urinary incontinence   . Wears glasses     Current Outpatient Medications:  .  acetaminophen (TYLENOL) 500 MG tablet, Take 1,000 mg by mouth every 8 (eight) hours as needed (pain). , Disp: ,  Rfl:  .  albuterol (VENTOLIN HFA) 108 (90 Base) MCG/ACT inhaler, Inhale 2 puffs into the lungs every 6 (six) hours as needed for wheezing or shortness of breath., Disp: 18 g, Rfl: 11 .  atorvastatin (LIPITOR) 20 MG tablet, Take 1 tablet (20 mg total) by mouth daily., Disp: 90 tablet, Rfl: 3 .  cetirizine (ZYRTEC) 10 MG tablet, Take 20 mg by mouth daily., Disp: , Rfl:  .  citalopram (CELEXA) 20 MG tablet, Take 1 tablet (20 mg total) by mouth daily., Disp: 180 tablet, Rfl: 3 .  cloNIDine (CATAPRES) 0.1 MG tablet, Take 1 tablet (0.1 mg total) by mouth daily. (Patient taking differently: Take 0.1 mg by mouth at bedtime. ), Disp: 90 tablet, Rfl: 3 .  docusate sodium (COLACE) 100 MG capsule, Take 100 mg by mouth 2 (two) times daily., Disp:  , Rfl:  .  empagliflozin (JARDIANCE) 10 MG TABS tablet, Take 10 mg by mouth daily before breakfast., Disp: 90 tablet, Rfl: 1 .  fluticasone (FLONASE) 50 MCG/ACT nasal spray, Place 1 spray into both nostrils 2 (two) times daily as needed for allergies or rhinitis., Disp: , Rfl:  .  furosemide (LASIX) 40 MG tablet, Take 1 tablet (40 mg total) by mouth daily., Disp: 90 tablet, Rfl: 1 .  gabapentin (NEURONTIN) 100 MG capsule, Take 2 capsules (200 mg total) by mouth 3 (three) times daily., Disp: 180 capsule, Rfl: 5 .  glucose blood (ONE TOUCH ULTRA TEST) test strip, TEST TWICE A DAY, Disp: 100 each, Rfl: 12 .  hydrOXYzine (ATARAX/VISTARIL) 25 MG tablet, TAKE 1 TABLET (25 MG TOTAL) BY MOUTH 3 (THREE) TIMES DAILY. TAKE ONE AT BEDTIME (Patient taking differently: Take 25 mg by mouth 3 (three) times daily. ), Disp: 270 tablet, Rfl: 2 .  insulin glargine (LANTUS) 100 UNIT/ML injection, INJECT 30-40 INTO THE SKIN DAILY., Disp: 10 mL, Rfl: 11 .  insulin lispro (HUMALOG) 100 UNIT/ML injection, Inject 0.04-0.1 mLs (4-10 Units total) into the skin 3 (three) times daily with meals. Sliding Scale, Disp: 10 mL, Rfl: 5 .  Insulin Syringe-Needle U-100 (BD INSULIN SYRINGE U/F) 31G X 5/16" 0.5 ML MISC, USE TO INJECT LANTUS AND NOVOLOG DAILY, Disp: 100 each, Rfl: 11 .  Iron-Vitamin C (VITRON-C) 65-125 MG TABS, Take 1 tablet by mouth daily., Disp: , Rfl:  .  KLOR-CON M20 20 MEQ tablet, TAKE 2 TABLETS BY MOUTH IN IN THE MORNING WITH FOOD & A FULL GLASS OF LIQUID. DO NOT CRUSH (Patient taking differently: Take 40 mEq by mouth daily. ), Disp: 60 tablet, Rfl: 5 .  levothyroxine (SYNTHROID) 75 MCG tablet, Take 1 tablet (75 mcg total) by mouth daily., Disp: 90 tablet, Rfl: 1 .  Magnesium 400 MG TABS, Take 400 mg by mouth daily., Disp: , Rfl:  .  magnesium hydroxide (PHILLIPS MILK OF MAGNESIA) 400 MG/5ML suspension, Take 60 mLs by mouth See admin instructions. Every 3 days - [name], Disp: , Rfl:  .  Menthol, Topical Analgesic,  (BIOFREEZE COLORLESS) 4 % GEL, Apply 1 application topically daily as needed (pain). Foot, Legs and Knees, Disp: , Rfl:  .  metFORMIN (GLUCOPHAGE) 500 MG tablet, Take 1 tablet (500 mg total) by mouth 2 (two) times daily with a meal., Disp: 180 tablet, Rfl: 3 .  metoprolol succinate (TOPROL XL) 25 MG 24 hr tablet, Take 1 tablet (25 mg total) by mouth daily., Disp: 90 tablet, Rfl: 1 .  montelukast (SINGULAIR) 10 MG tablet, TAKE 1 TABLET BY MOUTH ONCE DAILY IN THE EVENING (Patient  taking differently: Take 10 mg by mouth at bedtime. ), Disp: 90 tablet, Rfl: 3 .  Multiple Vitamins-Minerals (MULTIVITAMIN WITH MINERALS) tablet, Take 1 tablet by mouth daily. Centrum Silver, Disp: , Rfl:  .  pantoprazole (PROTONIX) 40 MG tablet, Take 1 tablet (40 mg total) by mouth daily., Disp: 30 tablet, Rfl: 11 .  Propylene Glycol (SYSTANE BALANCE OP), Place 1 drop into both eyes daily as needed (Dry eyes)., Disp: , Rfl:  .  quinapril (ACCUPRIL) 5 MG tablet, TAKE 2 TABLETS BY MOUTH EVERY DAY (Patient taking differently: Take 10 mg by mouth daily. ), Disp: 180 tablet, Rfl: 0 .  umeclidinium-vilanterol (ANORO ELLIPTA) 62.5-25 MCG/INH AEPB, Inhale 1 puff into the lungs daily., Disp: 60 each, Rfl: 4 .  Vitamin D, Ergocalciferol, (DRISDOL) 1.25 MG (50000 UT) CAPS capsule, TAKE 1 CAPSULE BY MOUTH ON TUESDAYS AND FRIDAYS EACH WEEK (Patient taking differently: Take 50,000 Units by mouth 2 (two) times a week. Tuesday and Friday), Disp: 12 capsule, Rfl: 3  Assessment/ Plan: 68 y.o. female   1. Uncontrolled type 1 diabetes mellitus with hyperglycemia (HCC) - atorvastatin (LIPITOR) 20 MG tablet; Take 1 tablet (20 mg total) by mouth daily.  Dispense: 90 tablet; Refill: 3 - insulin lispro (HUMALOG) 100 UNIT/ML injection; Inject 0.04-0.1 mLs (4-10 Units total) into the skin 3 (three) times daily with meals. Sliding Scale  Dispense: 10 mL; Refill: 5 - insulin glargine (LANTUS) 100 UNIT/ML injection; INJECT 30-40 INTO THE SKIN DAILY.   Dispense: 10 mL; Refill: 11 - Insulin Syringe-Needle U-100 (BD INSULIN SYRINGE U/F) 31G X 5/16" 0.5 ML MISC; USE TO INJECT LANTUS AND NOVOLOG DAILY  Dispense: 100 each; Refill: 11 - CBC with Differential/Platelet - CMP14+EGFR - Lipid Panel - TSH - hgba1c  2. Essential hypertension - CBC with Differential/Platelet - CMP14+EGFR  3. Hypothyroidism, unspecified type - TSH  4. Other diabetic neurological complication associated with type 2 diabetes mellitus (HCC) - gabapentin (NEURONTIN) 100 MG capsule; Take 2 capsules (200 mg total) by mouth 3 (three) times daily.  Dispense: 180 capsule; Refill: 5   No follow-ups on file.  Continue all other maintenance medications as listed above.  Start time: 10:08 AM End time: 10:29 AM  Meds ordered this encounter  Medications  . atorvastatin (LIPITOR) 20 MG tablet    Sig: Take 1 tablet (20 mg total) by mouth daily.    Dispense:  90 tablet    Refill:  3    HOLD UNTIL PATIENT CALLS    Order Specific Question:   Supervising Provider    Answer:   Janora Norlander [[id number]]  . insulin lispro (HUMALOG) 100 UNIT/ML injection    Sig: Inject 0.04-0.1 mLs (4-10 Units total) into the skin 3 (three) times daily with meals. Sliding Scale    Dispense:  10 mL    Refill:  5    Order Specific Question:   Supervising Provider    Answer:   Janora Norlander [[id number]]  . insulin glargine (LANTUS) 100 UNIT/ML injection    Sig: INJECT 30-40 INTO THE SKIN DAILY.    Dispense:  10 mL    Refill:  11    Order Specific Question:   Supervising Provider    Answer:   Janora Norlander [[id number]]  . Insulin Syringe-Needle U-100 (BD INSULIN SYRINGE U/F) 31G X 5/16" 0.5 ML MISC    Sig: USE TO INJECT LANTUS AND NOVOLOG DAILY    Dispense:  100 each    Refill:  11    Order  Specific Question:   Supervising Provider    Answer:   Janora Norlander [[id number]]  . gabapentin (NEURONTIN) 100 MG capsule    Sig: Take 2 capsules (200 mg total) by mouth 3 (three) times  daily.    Dispense:  180 capsule    Refill:  5    Order Specific Question:   Supervising Provider    Answer:   Janora Norlander [[id number]]    Angela Nearing PA-C [place] [phone]

## 2019-05-12 ENCOUNTER — Other Ambulatory Visit: Payer: Self-pay

## 2019-05-12 NOTE — Patient Outreach (Signed)
[facility]) Care Management  05/12/2019  [name] 12-28-1950 [id number]   Medication Adherence call to [name] patient did not want to engage she is past due on Atorvastatin 20 mg,patient explain her doctor knows how she is taking her medications and hung up the phone. [name] is showing past due under [place].  [place] Management Direct Dial [phone]  Fax [phone] [name]

## 2019-05-15 ENCOUNTER — Telehealth: Payer: Self-pay | Admitting: Physician Assistant

## 2019-05-15 ENCOUNTER — Other Ambulatory Visit: Payer: Self-pay | Admitting: Physician Assistant

## 2019-05-15 DIAGNOSIS — E1149 Type 2 diabetes mellitus with other diabetic neurological complication: Secondary | ICD-10-CM

## 2019-05-15 MED ORDER — GABAPENTIN 100 MG PO CAPS: 200.0000 mg | ORAL_CAPSULE | Freq: Three times a day (TID) | ORAL | 5 refills | Status: DC

## 2019-05-15 NOTE — Telephone Encounter (Signed)
There is no gabapentin 200mg  product.  So therefore the prescription I sent said for her to take 2 tablets 3 times a day, this is 2 of her 100 mg capsules, which will be 200 mg.  If she okay with doing this?

## 2019-05-15 NOTE — Telephone Encounter (Signed)
Patient aware.

## 2019-06-04 ENCOUNTER — Other Ambulatory Visit: Payer: Self-pay | Admitting: Physician Assistant

## 2019-06-04 ENCOUNTER — Telehealth: Payer: Self-pay | Admitting: Physician Assistant

## 2019-06-04 NOTE — Telephone Encounter (Signed)
2 tab at one time a day? Or 1 tab twice daily?

## 2019-06-04 NOTE — Telephone Encounter (Signed)
lmtcb

## 2019-06-09 ENCOUNTER — Other Ambulatory Visit: Payer: Self-pay | Admitting: Physician Assistant

## 2019-06-09 DIAGNOSIS — I10 Essential (primary) hypertension: Secondary | ICD-10-CM

## 2019-06-09 NOTE — Telephone Encounter (Signed)
Pt is requesting 20 mg One tab BID = is this ok to change. A Jones made notes, but did not technically approve.

## 2019-06-09 NOTE — Telephone Encounter (Signed)
Pt called back stating that she is requesting to take 1 tab twice a day for the Citalopram.

## 2019-06-09 NOTE — Telephone Encounter (Signed)
Patient states she would rather take Celexa 20mg  BID.

## 2019-06-09 NOTE — Telephone Encounter (Signed)
Yes it is fine to take celexa BID will send in new rx. Or can increae to 4o mg daily- which would she like to do

## 2019-06-10 ENCOUNTER — Other Ambulatory Visit: Payer: Self-pay | Admitting: Physician Assistant

## 2019-06-10 MED ORDER — CITALOPRAM HYDROBROMIDE 20 MG PO TABS: 20.0000 mg | ORAL_TABLET | Freq: Two times a day (BID) | ORAL | 5 refills | Status: DC

## 2019-06-10 NOTE — Telephone Encounter (Signed)
Patient aware and verbalized understanding. °

## 2019-06-10 NOTE — Addendum Note (Signed)
Addended by: Chevis Pretty on: 06/10/2019 02:05 PM   Modules accepted: Orders

## 2019-06-10 NOTE — Telephone Encounter (Signed)
celexa 20mg  changed from daily to BID- rx sent to pharmacy

## 2019-06-19 ENCOUNTER — Telehealth: Payer: Self-pay | Admitting: Cardiovascular Disease

## 2019-06-19 NOTE — Telephone Encounter (Signed)

## 2019-06-24 ENCOUNTER — Ambulatory Visit (INDEPENDENT_AMBULATORY_CARE_PROVIDER_SITE_OTHER): Payer: Medicare Other | Admitting: Family Medicine

## 2019-06-24 ENCOUNTER — Encounter: Payer: Self-pay | Admitting: Cardiovascular Disease

## 2019-06-24 ENCOUNTER — Other Ambulatory Visit: Payer: Self-pay

## 2019-06-24 VITALS — BP 138/72 | HR 85 | Ht 64.0 in | Wt 184.0 lb

## 2019-06-24 DIAGNOSIS — E782 Mixed hyperlipidemia: Secondary | ICD-10-CM

## 2019-06-24 DIAGNOSIS — I38 Endocarditis, valve unspecified: Secondary | ICD-10-CM

## 2019-06-24 DIAGNOSIS — K219 Gastro-esophageal reflux disease without esophagitis: Secondary | ICD-10-CM

## 2019-06-24 DIAGNOSIS — R002 Palpitations: Secondary | ICD-10-CM | POA: Diagnosis not present

## 2019-06-24 DIAGNOSIS — I10 Essential (primary) hypertension: Secondary | ICD-10-CM

## 2019-06-24 MED ORDER — OMEPRAZOLE MAGNESIUM 20 MG PO TBEC: 40.0000 mg | DELAYED_RELEASE_TABLET | Freq: Every day | ORAL | 2 refills | Status: DC

## 2019-06-24 NOTE — Patient Instructions (Addendum)
Medication Instructions:    Your physician has recommended you make the following change in your medication:   Start omeprazole magnesium 40 mg by mouth daily  Continue other medications the same  Labwork:  NONE  Testing/Procedures:  NONE  Follow-Up:  Your physician recommends that you schedule a follow-up appointment in: 1 year (office). You will receive a reminder letter in the mail in about 10 months reminding you to call and schedule your appointment. If you don't receive this letter, please contact our office.  Any Other Special Instructions Will Be Listed Below (If Applicable).  If you need a refill on your cardiac medications before your next appointment, please call your pharmacy.

## 2019-06-24 NOTE — Progress Notes (Signed)
Cardiology Office Note  Date: 06/24/2019   ID: ZAREAH KLEINSASSER, DOB 08-02-1950, MRN [id number]  PCP:  Terald Sleeper, PA-C  Cardiologist:  Kate Sable, MD Electrophysiologist:  None   Chief Complaint  Patient presents with  . Follow-up    History of Present Illness: DESHONE SHERBA is a 69 y.o. female last visit June 14, 2018 for follow-up for history of valvular heart disease, hypertension, palpitations, hyperlipidemia, last echocardiogram showed LVEF of 60 to 123456 grade 2 diastolic dysfunction with increased LV filling pressures, moderate mitral regurgitation and mild to moderate tricuspid regurgitation.  Had a hospitalization for DKA and July early August at [facility].  Recently had a colonoscopy with a polypectomy and has a history of diverticulosis.  Denies any recent palpitations, arrhythmias, fluttering sensations.  Recently saw PCP in November.  Lab work reviewed with patient from November.  Patient states she has been having problems adjusting her thyroid medication adequately at PCP office.  Her hemoglobin A1c has improved and is now down to 7.4% from a previous 8.2%.  Medications were reviewed with patient today.  Systolic blood pressure slightly elevated.  Patient states at home her blood pressures run anywhere from [id number] systolic normally.  Patient states she is attempting to eat better but her appetite has not been very good recently.  She is trying to avoid simple carbohydrates and concentrated sweets.  At last visit she did complain of some chest discomfort when lying down in bed.  She has a history of hiatal hernia and gastroesophageal reflux disease.  She states she has to sleep on at least 2 pillows to avoid significant reflux.  She is requesting changing from pantoprazole back to previous omeprazole with magnesium which she used to take.  Past Medical History:  Diagnosis Date  . Allergic rhinitis   . Allergy   . Anemia   . Ankle swelling   .  Anxiety    takes Xanax daily   . Arthritis   . Asthma   . Blurred vision, bilateral   . Chronic kidney disease   . Depression    takes Celexa daily  . Diabetes mellitus without complication ([place])    type II; takes Actos daily  . GERD (gastroesophageal reflux disease)   . Gout   . Headache   . Heart murmur   . Heart palpitations   . Hemorrhoids   . History of blood transfusion    no abnormal reaction noted  . History of bronchitis   . Hyperlipidemia   . Hypertension    takes Catapres and Quinapril daily  . Hypothyroidism    takes Synthroid daily  . Moderate mitral insufficiency    a. 12/2014 Echo: EF 55-60%, mod MR, mildly to moderately dil LA, PASP 75mmHg.  . Myocardial infarction ([place])    " Minor"  . Osteoarthritis   . Paroxysmal supraventricular tachycardia ([place])   . Recurrent ventral incisional hernia   . Ringing in ears   . Shortness of breath dyspnea    ALbuterol daily as needed  . Stevens-Johnson disease ([place])   . Urinary incontinence   . Wears glasses     Past Surgical History:  Procedure Laterality Date  . ABDOMINAL HYSTERECTOMY    . BOWEL RESECTION    . COLON SURGERY    . COLONOSCOPY    . COLONOSCOPY WITH PROPOFOL N/A 04/29/2019   Procedure: COLONOSCOPY WITH PROPOFOL;  Surgeon: Danie Binder, MD;  [facility];  Service: Endoscopy;  Laterality:  N/A;  7:30am  . ESOPHAGOGASTRODUODENOSCOPY    . [place]  . HERNIA REPAIR     hiatal hernia repair  . [place]   pt is unsure of exact date but thinks it was early 92's  . INCISIONAL HERNIA REPAIR N/A 04/02/2018   Procedure: OPEN VENTRAL HERNIA REPAIR WITH MESH, Explantation of hernia mesh;  Surgeon: Donnie Mesa, MD;  Location: [place];  Service: General;  Laterality: N/A;  . INSERTION OF MESH N/A 04/02/2018   Procedure: INSERTION OF MESH;  Surgeon: Donnie Mesa, MD;  Location: [place];  Service: General;  Laterality: N/A;  . POLYPECTOMY  04/29/2019   Procedure:  POLYPECTOMY;  Surgeon: Danie Binder, MD;  [facility];  Service: Endoscopy;;  . TOTAL HIP ARTHROPLASTY Left 02/02/2015   Procedure: LEFT TOTAL HIP ARTHROPLASTY;  Surgeon: Garald Balding, MD;  Location: [place];  Service: Orthopedics;  Laterality: Left;  . TOTAL HIP ARTHROPLASTY Right 02/01/2016   Procedure: TOTAL HIP ARTHROPLASTY;  Surgeon: Garald Balding, MD;  Location: [place];  Service: Orthopedics;  Laterality: Right;    Current Outpatient Medications  Medication Sig Dispense Refill  . acetaminophen (TYLENOL) 500 MG tablet Take 1,000 mg by mouth every 8 (eight) hours as needed (pain).     [name] albuterol (VENTOLIN HFA) 108 (90 Base) MCG/ACT inhaler Inhale 2 puffs into the lungs every 6 (six) hours as needed for wheezing or shortness of breath. 18 g 11  . atorvastatin (LIPITOR) 20 MG tablet Take 1 tablet (20 mg total) by mouth daily. 90 tablet 3  . cetirizine (ZYRTEC) 10 MG tablet Take 20 mg by mouth daily.    . citalopram (CELEXA) 20 MG tablet Take 1 tablet (20 mg total) by mouth 2 (two) times daily. 60 tablet 5  . cloNIDine (CATAPRES) 0.1 MG tablet Take 1 tablet (0.1 mg total) by mouth daily. (Patient taking differently: Take 0.1 mg by mouth at bedtime. ) 90 tablet 3  . docusate sodium (COLACE) 100 MG capsule Take 100 mg by mouth 2 (two) times daily.    . empagliflozin (JARDIANCE) 10 MG TABS tablet Take 10 mg by mouth daily before breakfast. 90 tablet 1  . fluticasone (FLONASE) 50 MCG/ACT nasal spray Place 1 spray into both nostrils 2 (two) times daily as needed for allergies or rhinitis.    . furosemide (LASIX) 40 MG tablet Take 1 tablet (40 mg total) by mouth daily. 90 tablet 1  . gabapentin (NEURONTIN) 100 MG capsule Take 2 capsules (200 mg total) by mouth 3 (three) times daily. 180 capsule 5  . glucose blood (ONE TOUCH ULTRA TEST) test strip TEST TWICE A DAY 100 each 12  . hydrOXYzine (ATARAX/VISTARIL) 25 MG tablet TAKE 1 TABLET (25 MG TOTAL) BY MOUTH 3 (THREE) TIMES DAILY.  TAKE ONE AT BEDTIME (Patient taking differently: Take 25 mg by mouth 3 (three) times daily. ) 270 tablet 2  . insulin glargine (LANTUS) 100 UNIT/ML injection INJECT 30-40 INTO THE SKIN DAILY. 10 mL 11  . insulin lispro (HUMALOG) 100 UNIT/ML injection Inject 0.04-0.1 mLs (4-10 Units total) into the skin 3 (three) times daily with meals. Sliding Scale 10 mL 5  . Insulin Syringe-Needle U-100 (BD INSULIN SYRINGE U/F) 31G X 5/16" 0.5 ML MISC USE TO INJECT LANTUS AND NOVOLOG DAILY 100 each 11  . Iron-Vitamin C (VITRON-C) 65-125 MG TABS Take 1 tablet by mouth daily.    [name] KLOR-CON M20 20 MEQ tablet TAKE 2 TABLETS  BY MOUTH IN IN THE MORNING WITH FOOD & A FULL GLASS OF LIQUID. DO NOT CRUSH (Patient taking differently: Take 40 mEq by mouth daily. ) 60 tablet 5  . levothyroxine (SYNTHROID) 75 MCG tablet Take 1 tablet (75 mcg total) by mouth daily. 90 tablet 1  . Magnesium 400 MG TABS Take 400 mg by mouth daily.    . magnesium hydroxide (PHILLIPS MILK OF MAGNESIA) 400 MG/5ML suspension Take 60 mLs by mouth See admin instructions. Every 3 days - [name]    . Menthol, Topical Analgesic, (BIOFREEZE COLORLESS) 4 % GEL Apply 1 application topically daily as needed (pain). Foot, Legs and Knees    . metFORMIN (GLUCOPHAGE) 500 MG tablet Take 1 tablet (500 mg total) by mouth 2 (two) times daily with a meal. 180 tablet 3  . metoprolol succinate (TOPROL-XL) 25 MG 24 hr tablet TAKE 1 TABLET BY MOUTH EVERY DAY 90 tablet 1  . montelukast (SINGULAIR) 10 MG tablet TAKE 1 TABLET BY MOUTH ONCE DAILY IN THE EVENING (Patient taking differently: Take 10 mg by mouth at bedtime. ) 90 tablet 3  . Multiple Vitamins-Minerals (MULTIVITAMIN WITH MINERALS) tablet Take 1 tablet by mouth daily. Centrum Silver    . pantoprazole (PROTONIX) 40 MG tablet Take 1 tablet (40 mg total) by mouth daily. 30 tablet 11  . Propylene Glycol (SYSTANE BALANCE OP) Place 1 drop into both eyes daily as needed (Dry eyes).    . quinapril (ACCUPRIL) 5 MG tablet  TAKE 2 TABLETS BY MOUTH EVERY DAY 180 tablet 1  . umeclidinium-vilanterol (ANORO ELLIPTA) 62.5-25 MCG/INH AEPB Inhale 1 puff into the lungs daily. 60 each 4  . Vitamin D, Ergocalciferol, (DRISDOL) 1.25 MG (50000 UT) CAPS capsule TAKE 1 CAPSULE BY MOUTH ON TUESDAYS AND FRIDAYS EACH WEEK (Patient taking differently: Take 50,000 Units by mouth 2 (two) times a week. Tuesday and Friday) 12 capsule 3  . omeprazole (PRILOSEC OTC) 20 MG tablet Take 2 tablets (40 mg total) by mouth daily. 180 tablet 2   No current facility-administered medications for this visit.   Allergies:  Other, Aspirin, Allopurinol, Penicillins, and Sulfa antibiotics   Social History: The patient  reports that she has never smoked. She has never used smokeless tobacco. She reports that she does not drink alcohol or use drugs.   Family History: The patient's family history includes COPD in her father; Cancer in her father; Colon cancer in her maternal aunt and paternal aunt; Colon polyps in her mother; Diabetes in her mother; Heart attack in her mother; Hyperlipidemia in her sister; Hypertension in her mother and sister; Kidney disease in her mother.   ROS:  Please see the history of present illness. Otherwise, complete review of systems is positive for none.  All other systems are reviewed and negative.   Physical Exam: VS:  BP 138/72   Pulse 85   Ht 5\' 4"  (1.626 m)   Wt 184 lb (83.5 kg)   SpO2 98%   BMI 31.58 kg/m , BMI Body mass index is 31.58 kg/m.  Wt Readings from Last 3 Encounters:  06/24/19 184 lb (83.5 kg)  04/29/19 185 lb (83.9 kg)  04/25/19 185 lb (83.9 kg)    General: Patient appears comfortable at rest. HEENT: Conjunctiva and lids normal, oropharynx clear with moist mucosa. Neck: Supple, no elevated JVP or carotid bruits, no thyromegaly. Lungs: Clear to auscultation, nonlabored breathing at rest. Cardiac: Regular rate and rhythm, no S3 or significant systolic murmur, no pericardial rub. Abdomen: Soft,  nontender,  no hepatomegaly, bowel sounds present, no guarding or rebound. Extremities: No pitting edema, distal pulses 2+. Skin: Warm and dry. Musculoskeletal: No kyphosis. Neuropsychiatric: Alert and oriented x3, affect grossly appropriate.  ECG:  An ECG dated June 24, 2019 was personally reviewed today and demonstrated:  Sinus rhythm rate of 79, nonspecific T wave abnormality  Recent Labwork: 05/02/2019: ALT 13; AST 14; BUN 20; Creatinine, Ser 1.26; Hemoglobin 12.9; Platelets 294; Potassium 4.5; Sodium 139; TSH 0.029     Component Value Date/Time   CHOL 156 05/02/2019 1126   TRIG 158 (H) 05/02/2019 1126   HDL 39 (L) 05/02/2019 1126   CHOLHDL 4.0 05/02/2019 1126   [place]gan 89 05/02/2019 1126    Other Studies Reviewed Today: Echocardiogram January 26, 2016 Study Conclusions  - Left ventricle: The cavity size was normal. Wall thickness was   normal. Systolic function was normal. The estimated ejection   fraction was in the range of 60% to 65%. Wall motion was normal;   there were no regional wall motion abnormalities. Features are   consistent with a pseudonormal left ventricular filling pattern,   with concomitant abnormal relaxation and increased filling   pressure (grade 2 diastolic dysfunction). - Mitral valve: Mild thickening and calcification. There was   moderate regurgitation. - Left atrium: The atrium was mildly dilated. - Right atrium: Central venous pressure (est): 3 mm Hg. - Tricuspid valve: There was mild-moderate regurgitation. - Pulmonary arteries: PA peak pressure: 38 mm Hg (S). - Pericardium, extracardiac: There was no pericardial effusion.  Impressions:  - Normal LV wall thickness with LVEF 60-65%. Grade 2 diastolic   dysfunction with increased LV filling pressure. Mild left atrial   enlargement. Mildly thickened and calcified mitral valve with   moderate mitral regurgitation. Mild to moderate tricuspid   regurgitation with PASP 38 mmHg. Assessment  and Plan:  1. Palpitations   2. Essential hypertension   3. Valvular heart disease   4. Mixed hyperlipidemia     1. Palpitations Patient denies any recent episodes of palpitations, arrhythmias, or fluttering sensations, skipped heartbeats.  Denies any syncopal or near syncopal episodes.  2. Essential hypertension Systolic blood pressure today 138.  Patient states blood pressures at home running in the 115-120 range.  Continue quinapril 10 mg daily, clonidine 0.1 mg, Lasix 40 mg daily.  3. Valvular heart disease Mild to moderate TR and moderate MR.  Echo August 2017.  Patient has no complaints of dyspnea/shortness of breath on exertion, or lower extremity edema.  4. Mixed hyperlipidemia Recent LDL 89.  Continue Lipitor 20 mg daily.  5. Gastroesophageal reflux disease without esophagitis Patient states her pantoprazole does not seem to be working to relieve her GERD symptoms.  She would like to revert back to omeprazole 40 mg with magnesium at previous dose.  Start omeprazole with magnesium 20 mg 2 pills daily.  Medication Adjustments/Labs and Tests Ordered: Current medicines are reviewed at length with the patient today.  Concerns regarding medicines are outlined above.    Patient Instructions  Medication Instructions:    Your physician has recommended you make the following change in your medication:   Start omeprazole magnesium 40 mg by mouth daily  Continue other medications the same  Labwork:  NONE  Testing/Procedures:  NONE  Follow-Up:  Your physician recommends that you schedule a follow-up appointment in: 1 year (office). You will receive a reminder letter in the mail in about 10 months reminding you to call and schedule your appointment. If you don't receive this  letter, please contact our office.  Any Other Special Instructions Will Be Listed Below (If Applicable).  If you need a refill on your cardiac medications before your next appointment, please call  your pharmacy.        Signed, Levell July, NP 06/24/2019 4:26 PM    [facility], [place], [place] [address] Phone: [phone]; Fax: [phone]

## 2019-07-03 ENCOUNTER — Ambulatory Visit: Payer: Medicare Other | Admitting: Cardiovascular Disease

## 2019-07-07 ENCOUNTER — Other Ambulatory Visit: Payer: Self-pay | Admitting: Nurse Practitioner

## 2019-08-04 ENCOUNTER — Telehealth: Payer: Self-pay | Admitting: *Deleted

## 2019-08-04 LAB — HM DIABETES EYE EXAM

## 2019-08-04 NOTE — Telephone Encounter (Signed)
Need clarification for PPI.  Patient started omeprazole but is unsure if she needed to stay on protonix. Patient is okay to take both. Please advise.

## 2019-08-05 NOTE — Telephone Encounter (Signed)
Hey. Sorry so late in responding. I looked at the note and figured it out. She told me her pantoprazole wasn't working anymore and wanted to go back on omeprazole. I am pretty sure I told her to stop the pantoprazole. Thanks

## 2019-08-05 NOTE — Telephone Encounter (Signed)
Patient informed to stop pantoprazole and continue omeprazole 40 mg daily. Verbalized understanding.

## 2019-08-05 NOTE — Telephone Encounter (Signed)
Patient called back asking about her call yesterday

## 2019-08-05 NOTE — Telephone Encounter (Signed)
Patient called back again asking about status of her call.  Would like to know what she needs to do

## 2019-08-06 ENCOUNTER — Ambulatory Visit (INDEPENDENT_AMBULATORY_CARE_PROVIDER_SITE_OTHER): Payer: Medicare Other

## 2019-08-06 ENCOUNTER — Ambulatory Visit (INDEPENDENT_AMBULATORY_CARE_PROVIDER_SITE_OTHER): Payer: Medicare Other | Admitting: Physician Assistant

## 2019-08-06 ENCOUNTER — Other Ambulatory Visit: Payer: Self-pay

## 2019-08-06 DIAGNOSIS — M8589 Other specified disorders of bone density and structure, multiple sites: Secondary | ICD-10-CM | POA: Diagnosis not present

## 2019-08-06 DIAGNOSIS — E1065 Type 1 diabetes mellitus with hyperglycemia: Secondary | ICD-10-CM

## 2019-08-06 DIAGNOSIS — I10 Essential (primary) hypertension: Secondary | ICD-10-CM

## 2019-08-06 DIAGNOSIS — M810 Age-related osteoporosis without current pathological fracture: Secondary | ICD-10-CM

## 2019-08-06 DIAGNOSIS — K2100 Gastro-esophageal reflux disease with esophagitis, without bleeding: Secondary | ICD-10-CM | POA: Diagnosis not present

## 2019-08-06 LAB — BAYER DCA HB A1C WAIVED: HB A1C (BAYER DCA - WAIVED): 6.9 % (ref <7.0)

## 2019-08-06 MED ORDER — BUSPIRONE HCL 5 MG PO TABS: 5.0000 mg | ORAL_TABLET | Freq: Two times a day (BID) | ORAL | 2 refills | Status: DC

## 2019-08-06 MED ORDER — FUROSEMIDE 40 MG PO TABS: 40.0000 mg | ORAL_TABLET | Freq: Every day | ORAL | 1 refills | Status: DC

## 2019-08-06 MED ORDER — ESOMEPRAZOLE MAGNESIUM 40 MG PO CPDR: 40.0000 mg | DELAYED_RELEASE_CAPSULE | Freq: Every day | ORAL | 5 refills | Status: DC

## 2019-08-06 MED ORDER — JARDIANCE 10 MG PO TABS: 10.0000 mg | ORAL_TABLET | Freq: Every day | ORAL | 1 refills | Status: DC

## 2019-08-06 NOTE — Progress Notes (Signed)
Telephone visit  Subjective: [name] on chronic conditions PCP: Terald Sleeper, PA-C QTM:[id number] L [name] is a 69 y.o. female calls for telephone consult today. Patient provides verbal consent for consult held via phone.  Patient is identified with 2 separate identifiers.  At this time the entire area is on COVID-19 social distancing and stay home orders are in place.  Patient is of higher risk and therefore we are performing this by a virtual method.  Location of patient: home Location of provider: HOME Others present for call: no  This patient has followed up in January with her cardiologist.  That note is reviewed.  She states that she is having difficulty with her reflux and does need a refill.  In the past she has failed Protonix and omeprazole.  We are going to have her take Nexium 40 mg 1 daily.  If that does not give her some relief the next step is to have referral back to gastroenterology.  Thyroid Patient is having a good response to her medication and does need to have refills on.  Osteoarthritis multiple joints she does need to continue with her medication and will have labs updated.  In the past she has had some kidney abnormalities and has been very good and stable lately we will have those labs also performed.  Refills will be sent to her pharmacy I will see her back in 3 months.  ROS: Per HPI  Allergies  Allergen Reactions  . Other Itching, Nausea Only and Other (See Comments)    Acidic foods cause stomach upset and itching  . Aspirin Other (See Comments)    Can only take 81 - full strength will give acid reflux  . Allopurinol Rash  . Penicillins Rash    Has patient had a PCN reaction causing immediate rash, facial/tongue/throat swelling, SOB or lightheadedness with hypotension: unkn Has patient had a PCN reaction causing severe rash involving mucus membranes or skin necrosis: unkn Has patient had a PCN reaction that required hospitalization: unkn Has  patient had a PCN reaction occurring within the last 10 years: unkn If all of the above answers are "NO", then may proceed with Cephalosporin use.   . Sulfa Antibiotics Rash   Past Medical History:  Diagnosis Date  . Allergic rhinitis   . Allergy   . Anemia   . Ankle swelling   . Anxiety    takes Xanax daily   . Arthritis   . Asthma   . Blurred vision, bilateral   . Chronic kidney disease   . Depression    takes Celexa daily  . Diabetes mellitus without complication ([place])    type II; takes Actos daily  . GERD (gastroesophageal reflux disease)   . Gout   . Headache   . Heart murmur   . Heart palpitations   . Hemorrhoids   . History of blood transfusion    no abnormal reaction noted  . History of bronchitis   . Hyperlipidemia   . Hypertension    takes Catapres and Quinapril daily  . Hypothyroidism    takes Synthroid daily  . Moderate mitral insufficiency    a. 12/2014 Echo: EF 55-60%, mod MR, mildly to moderately dil LA, PASP 65mHg.  . [place]ocardial infarction ([facility]    " Minor"  . Osteoarthritis   . Paroxysmal supraventricular tachycardia ([facility]   . Recurrent ventral incisional hernia   . Ringing in ears   . Shortness of breath dyspnea  ALbuterol daily as needed  . Stevens-Johnson disease ([place])   . Urinary incontinence   . Wears glasses     Current Outpatient Medications:  .  acetaminophen (TYLENOL) 500 MG tablet, Take 1,000 mg by mouth every 8 (eight) hours as needed (pain). , Disp: , Rfl:  .  albuterol (VENTOLIN HFA) 108 (90 Base) MCG/ACT inhaler, Inhale 2 puffs into the lungs every 6 (six) hours as needed for wheezing or shortness of breath., Disp: 18 g, Rfl: 11 .  atorvastatin (LIPITOR) 20 MG tablet, Take 1 tablet (20 mg total) by mouth daily., Disp: 90 tablet, Rfl: 3 .  busPIRone (BUSPAR) 5 MG tablet, Take 1-2 tablets (5-10 mg total) by mouth 2 (two) times daily., Disp: 120 tablet, Rfl: 2 .  cetirizine (ZYRTEC) 10 MG tablet, Take 20 mg by mouth daily., Disp: ,  Rfl:  .  citalopram (CELEXA) 20 MG tablet, TAKE 1 TABLET BY MOUTH TWICE A DAY, Disp: 180 tablet, Rfl: 1 .  cloNIDine (CATAPRES) 0.1 MG tablet, Take 1 tablet (0.1 mg total) by mouth daily. (Patient taking differently: Take 0.1 mg by mouth at bedtime. ), Disp: 90 tablet, Rfl: 3 .  docusate sodium (COLACE) 100 MG capsule, Take 100 mg by mouth 2 (two) times daily., Disp: , Rfl:  .  empagliflozin (JARDIANCE) 10 MG TABS tablet, Take 10 mg by mouth daily before breakfast., Disp: 90 tablet, Rfl: 1 .  esomeprazole (NEXIUM) 40 MG capsule, Take 1 capsule (40 mg total) by mouth daily., Disp: 30 capsule, Rfl: 5 .  fluticasone (FLONASE) 50 MCG/ACT nasal spray, Place 1 spray into both nostrils 2 (two) times daily as needed for allergies or rhinitis., Disp: , Rfl:  .  furosemide (LASIX) 40 MG tablet, Take 1 tablet (40 mg total) by mouth daily., Disp: 90 tablet, Rfl: 1 .  gabapentin (NEURONTIN) 100 MG capsule, Take 2 capsules (200 mg total) by mouth 3 (three) times daily., Disp: 180 capsule, Rfl: 5 .  glucose blood (ONE TOUCH ULTRA TEST) test strip, TEST TWICE A DAY, Disp: 100 each, Rfl: 12 .  hydrOXYzine (ATARAX/VISTARIL) 25 MG tablet, TAKE 1 TABLET (25 MG TOTAL) BY MOUTH 3 (THREE) TIMES DAILY. TAKE ONE AT BEDTIME (Patient taking differently: Take 25 mg by mouth 3 (three) times daily. ), Disp: 270 tablet, Rfl: 2 .  insulin glargine (LANTUS) 100 UNIT/ML injection, INJECT 30-40 INTO THE SKIN DAILY., Disp: 10 mL, Rfl: 11 .  insulin lispro (HUMALOG) 100 UNIT/ML injection, Inject 0.04-0.1 mLs (4-10 Units total) into the skin 3 (three) times daily with meals. Sliding Scale, Disp: 10 mL, Rfl: 5 .  Insulin Syringe-Needle U-100 (BD INSULIN SYRINGE U/F) 31G X 5/16" 0.5 ML MISC, USE TO INJECT LANTUS AND NOVOLOG DAILY, Disp: 100 each, Rfl: 11 .  Iron-Vitamin C (VITRON-C) 65-125 MG TABS, Take 1 tablet by mouth daily., Disp: , Rfl:  .  KLOR-CON M20 20 MEQ tablet, TAKE 2 TABLETS BY MOUTH IN IN THE MORNING WITH FOOD & A FULL GLASS  OF LIQUID. DO NOT CRUSH (Patient taking differently: Take 40 mEq by mouth daily. ), Disp: 60 tablet, Rfl: 5 .  levothyroxine (SYNTHROID) 50 MCG tablet, Take 1 tablet (50 mcg total) by mouth daily., Disp: 90 tablet, Rfl: 1 .  Magnesium 400 MG TABS, Take 400 mg by mouth daily., Disp: , Rfl:  .  magnesium hydroxide (PHILLIPS MILK OF MAGNESIA) 400 MG/5ML suspension, Take 60 mLs by mouth See admin instructions. Every 3 days - [name], Disp: , Rfl:  .  Menthol,  Topical Analgesic, (BIOFREEZE COLORLESS) 4 % GEL, Apply 1 application topically daily as needed (pain). Foot, Legs and Knees, Disp: , Rfl:  .  metFORMIN (GLUCOPHAGE) 500 MG tablet, Take 1 tablet (500 mg total) by mouth 2 (two) times daily with a meal., Disp: 180 tablet, Rfl: 3 .  metoprolol succinate (TOPROL-XL) 25 MG 24 hr tablet, TAKE 1 TABLET BY MOUTH EVERY DAY, Disp: 90 tablet, Rfl: 1 .  montelukast (SINGULAIR) 10 MG tablet, TAKE 1 TABLET BY MOUTH ONCE DAILY IN THE EVENING (Patient taking differently: Take 10 mg by mouth at bedtime. ), Disp: 90 tablet, Rfl: 3 .  Multiple Vitamins-Minerals (MULTIVITAMIN WITH MINERALS) tablet, Take 1 tablet by mouth daily. Centrum Silver, Disp: , Rfl:  .  Propylene Glycol (SYSTANE BALANCE OP), Place 1 drop into both eyes daily as needed (Dry eyes)., Disp: , Rfl:  .  quinapril (ACCUPRIL) 5 MG tablet, TAKE 2 TABLETS BY MOUTH EVERY DAY, Disp: 180 tablet, Rfl: 1 .  umeclidinium-vilanterol (ANORO ELLIPTA) 62.5-25 MCG/INH AEPB, Inhale 1 puff into the lungs daily., Disp: 60 each, Rfl: 4 .  Vitamin D, Ergocalciferol, (DRISDOL) 1.25 MG (50000 UT) CAPS capsule, TAKE 1 CAPSULE BY MOUTH ON TUESDAYS AND FRIDAYS EACH WEEK (Patient taking differently: Take 50,000 Units by mouth 2 (two) times a week. Tuesday and Friday), Disp: 12 capsule, Rfl: 3  Assessment/ Plan: 69 y.o. female   1. Uncontrolled type 1 diabetes mellitus with hyperglycemia (HCC) - empagliflozin (JARDIANCE) 10 MG TABS tablet; Take 10 mg by mouth daily before  breakfast.  Dispense: 90 tablet; Refill: 1 - CBC with Differential/Platelet - CMP14+EGFR - Lipid panel - TSH - Bayer DCA Hb A1c Waived - Microalbumin / creatinine urine ratio  2. Essential hypertension - furosemide (LASIX) 40 MG tablet; Take 1 tablet (40 mg total) by mouth daily.  Dispense: 90 tablet; Refill: 1 - CBC with Differential/Platelet - CMP14+EGFR - Lipid panel - TSH - Bayer DCA Hb A1c Waived - Microalbumin / creatinine urine ratio  3. Gastroesophageal reflux disease with esophagitis without hemorrhage - esomeprazole (NEXIUM) 40 MG capsule; Take 1 capsule (40 mg total) by mouth daily.  Dispense: 30 capsule; Refill: 5  4. Age-related osteoporosis without current pathological fracture - DG [facility] DEXA   Return in about 3 months (around 11/03/2019).  Continue all other maintenance medications as listed above.  Start time: 9:55 AM  End time: 10:09 AM  Meds ordered this encounter  Medications  . empagliflozin (JARDIANCE) 10 MG TABS tablet    Sig: Take 10 mg by mouth daily before breakfast.    Dispense:  90 tablet    Refill:  1    Order Specific Question:   Supervising Provider    Answer:   Janora Norlander [[id number]]  . furosemide (LASIX) 40 MG tablet    Sig: Take 1 tablet (40 mg total) by mouth daily.    Dispense:  90 tablet    Refill:  1    Order Specific Question:   Supervising Provider    Answer:   Janora Norlander [[id number]]  . esomeprazole (NEXIUM) 40 MG capsule    Sig: Take 1 capsule (40 mg total) by mouth daily.    Dispense:  30 capsule    Refill:  5    Order Specific Question:   Supervising Provider    Answer:   Janora Norlander [[id number]]  . busPIRone (BUSPAR) 5 MG tablet    Sig: Take 1-2 tablets (5-10 mg total) by mouth 2 (two) times daily.  Dispense:  120 tablet    Refill:  2    Order Specific Question:   Supervising Provider    Answer:   Janora Norlander [[id number]]    Particia Nearing PA-C [place] [phone]

## 2019-08-07 ENCOUNTER — Other Ambulatory Visit: Payer: Self-pay | Admitting: Physician Assistant

## 2019-08-07 ENCOUNTER — Encounter: Payer: Self-pay | Admitting: Physician Assistant

## 2019-08-07 DIAGNOSIS — E039 Hypothyroidism, unspecified: Secondary | ICD-10-CM

## 2019-08-07 LAB — LIPID PANEL
Chol/HDL Ratio: 3.8 ratio (ref 0.0–4.4)
Cholesterol, Total: 136 mg/dL (ref 100–199)
HDL: 36 mg/dL — ABNORMAL LOW (ref >39)
LDL Chol Calc (NIH): 81 mg/dL (ref 0–99)
Triglycerides: 99 mg/dL (ref 0–149)
VLDL Cholesterol Cal: 19 mg/dL (ref 5–40)

## 2019-08-07 LAB — CBC WITH DIFFERENTIAL/PLATELET
Basophils Absolute: 0 10*3/uL (ref 0.0–0.2)
Basos: 0 %
EOS (ABSOLUTE): 0.1 10*3/uL (ref 0.0–0.4)
Eos: 2 %
Hematocrit: 41.4 % (ref 34.0–46.6)
Hemoglobin: 13.1 g/dL (ref 11.1–15.9)
Immature Grans (Abs): 0 10*3/uL (ref 0.0–0.1)
Immature Granulocytes: 0 %
Lymphocytes Absolute: 1.7 10*3/uL (ref 0.7–3.1)
Lymphs: 23 %
MCH: 23.9 pg — ABNORMAL LOW (ref 26.6–33.0)
MCHC: 31.6 g/dL (ref 31.5–35.7)
MCV: 76 fL — ABNORMAL LOW (ref 79–97)
Monocytes Absolute: 0.5 10*3/uL (ref 0.1–0.9)
Monocytes: 7 %
Neutrophils Absolute: 5.2 10*3/uL (ref 1.4–7.0)
Neutrophils: 68 %
Platelets: 283 10*3/uL (ref 150–450)
RBC: 5.47 x10E6/uL — ABNORMAL HIGH (ref 3.77–5.28)
RDW: 14.6 % (ref 11.7–15.4)
WBC: 7.5 10*3/uL (ref 3.4–10.8)

## 2019-08-07 LAB — TSH: TSH: 0.355 u[IU]/mL — ABNORMAL LOW (ref 0.450–4.500)

## 2019-08-07 LAB — CMP14+EGFR
ALT: 12 IU/L (ref 0–32)
AST: 19 IU/L (ref 0–40)
Albumin/Globulin Ratio: 1.5 (ref 1.2–2.2)
Albumin: 4.3 g/dL (ref 3.8–4.8)
Alkaline Phosphatase: 209 IU/L — ABNORMAL HIGH (ref 39–117)
BUN/Creatinine Ratio: 18 (ref 12–28)
BUN: 25 mg/dL (ref 8–27)
Bilirubin Total: 0.5 mg/dL (ref 0.0–1.2)
CO2: 22 mmol/L (ref 20–29)
Calcium: 9.8 mg/dL (ref 8.7–10.3)
Chloride: 100 mmol/L (ref 96–106)
Creatinine, Ser: 1.39 mg/dL — ABNORMAL HIGH (ref 0.57–1.00)
GFR calc Af Amer: 45 mL/min/{1.73_m2} — ABNORMAL LOW (ref >59)
GFR calc non Af Amer: 39 mL/min/{1.73_m2} — ABNORMAL LOW (ref >59)
Globulin, Total: 2.8 g/dL (ref 1.5–4.5)
Glucose: 128 mg/dL — ABNORMAL HIGH (ref 65–99)
Potassium: 4.8 mmol/L (ref 3.5–5.2)
Sodium: 139 mmol/L (ref 134–144)
Total Protein: 7.1 g/dL (ref 6.0–8.5)

## 2019-08-07 LAB — MICROALBUMIN / CREATININE URINE RATIO
Creatinine, Urine: 34.6 mg/dL
Microalb/Creat Ratio: 12 mg/g creat (ref 0–29)
Microalbumin, Urine: 4.2 ug/mL

## 2019-08-07 MED ORDER — LEVOTHYROXINE SODIUM 50 MCG PO TABS: 50.0000 ug | ORAL_TABLET | Freq: Every day | ORAL | 1 refills | Status: DC

## 2019-08-11 ENCOUNTER — Other Ambulatory Visit: Payer: Self-pay | Admitting: Physician Assistant

## 2019-08-11 MED ORDER — ALENDRONATE SODIUM 70 MG PO TABS: 70.0000 mg | ORAL_TABLET | ORAL | 11 refills | Status: DC

## 2019-08-13 ENCOUNTER — Telehealth: Payer: Self-pay | Admitting: Physician Assistant

## 2019-08-13 NOTE — Chronic Care Management (AMB) (Signed)
  Chronic Care Management   Outreach Note  08/13/2019 Name: [name] MRN: [id number] DOB: [birth year]  [name] is a 70 y.o. year old female who is a primary care patient of Terald Sleeper, PA-C. I reached out to [name] by phone today in response to a referral sent by Ms. Adriana Reams [place]'s health plan.     A telephone outreach was attempted today, the patient will call and verify with [facility] that CCM is not a scam, and will call back to schedule. The patient was referred to the case management team for assistance with care management and care coordination.   Follow Up Plan: The care management team will reach out to the patient again over the next 7 days. If patient returns call to provider office, please advise to call [facility] at [phone].  Noreene Larsson, [place] [place], [place], [place] [address] Direct Dial: [phone] [email or web address] Website: [email or web address]

## 2019-08-18 NOTE — Chronic Care Management (AMB) (Signed)
Chronic Care Management   Note  08/18/2019 Name: ROSALYND MCWRIGHT MRN: [id number] DOB: 04/21/1951  [name] is a 69 y.o. year old female who is a primary care patient of Theodoro Clock. I reached out to [name] by phone today in response to a referral sent by Ms. Adriana Reams [place]'s health plan.     Ms. Yonan was given information about Chronic Care Management services today including:  1. CCM service includes personalized support from designated clinical staff supervised by her physician, including individualized plan of care and coordination with other care providers 2. 24/7 contact phone numbers for assistance for urgent and routine care needs. 3. Service will only be billed when office clinical staff spend 20 minutes or more in a month to coordinate care. 4. Only one practitioner may furnish and bill the service in a calendar month. 5. The patient may stop CCM services at any time (effective at the end of the month) by phone call to the office staff. 6. The patient will be responsible for cost sharing (co-pay) of up to 20% of the service fee (after annual deductible is met).  Patient did not agree to enrollment in care management services and does not wish to consider at this time.  Follow up plan: The patient has been provided with contact information for the care management team and has been advised to call with any health related questions or concerns.   Noreene Larsson, [place], [place], [place] [address] Direct Dial: [phone] [email or web address] Website: [email or web address]

## 2019-08-19 DIAGNOSIS — Z029 Encounter for administrative examinations, unspecified: Secondary | ICD-10-CM

## 2019-08-25 ENCOUNTER — Telehealth: Payer: Self-pay | Admitting: *Deleted

## 2019-08-25 NOTE — Telephone Encounter (Signed)
Was told by insurance company that Dr. Raliegh Ip was no longer in net work with her insurance. If this is the case, patient would like to be switched to Dr. Harl Bowie. Advised that message would be sent to our office manager for assistance.

## 2019-08-26 NOTE — Telephone Encounter (Signed)
Patient informed and verbalized understanding

## 2019-08-26 NOTE — Telephone Encounter (Signed)
Elmon Else, RN  Please let the patient know that we are working on it. There is no need to switch Drs. At this time. We will resolve the issue asap.  Thanks,  [employer]

## 2019-08-29 ENCOUNTER — Other Ambulatory Visit: Payer: Self-pay | Admitting: Physician Assistant

## 2019-09-07 ENCOUNTER — Other Ambulatory Visit: Payer: Self-pay | Admitting: Physician Assistant

## 2019-09-11 ENCOUNTER — Other Ambulatory Visit: Payer: Self-pay | Admitting: Family

## 2019-09-30 ENCOUNTER — Telehealth: Payer: Self-pay | Admitting: Family

## 2019-09-30 NOTE — Telephone Encounter (Signed)
Updated chart.

## 2019-10-07 ENCOUNTER — Other Ambulatory Visit: Payer: Self-pay | Admitting: *Deleted

## 2019-10-09 ENCOUNTER — Other Ambulatory Visit: Payer: Self-pay | Admitting: *Deleted

## 2019-10-10 ENCOUNTER — Ambulatory Visit: Payer: Medicare Other | Admitting: Family

## 2019-10-11 ENCOUNTER — Other Ambulatory Visit: Payer: Self-pay | Admitting: Family Medicine

## 2019-10-16 ENCOUNTER — Telehealth: Payer: Self-pay | Admitting: Family

## 2019-10-27 ENCOUNTER — Other Ambulatory Visit: Payer: Self-pay

## 2019-10-27 ENCOUNTER — Ambulatory Visit (INDEPENDENT_AMBULATORY_CARE_PROVIDER_SITE_OTHER): Payer: Medicare Other | Admitting: Family

## 2019-10-27 ENCOUNTER — Encounter: Payer: Self-pay | Admitting: Family

## 2019-10-27 VITALS — BP 144/79 | HR 100 | Temp 96.9°F | Ht 64.0 in | Wt 184.2 lb

## 2019-10-27 DIAGNOSIS — R748 Abnormal levels of other serum enzymes: Secondary | ICD-10-CM | POA: Diagnosis not present

## 2019-10-27 DIAGNOSIS — I10 Essential (primary) hypertension: Secondary | ICD-10-CM

## 2019-10-27 DIAGNOSIS — F411 Generalized anxiety disorder: Secondary | ICD-10-CM

## 2019-10-27 DIAGNOSIS — J4520 Mild intermittent asthma, uncomplicated: Secondary | ICD-10-CM | POA: Diagnosis not present

## 2019-10-27 DIAGNOSIS — Z96649 Presence of unspecified artificial hip joint: Secondary | ICD-10-CM

## 2019-10-27 DIAGNOSIS — E039 Hypothyroidism, unspecified: Secondary | ICD-10-CM

## 2019-10-27 DIAGNOSIS — E559 Vitamin D deficiency, unspecified: Secondary | ICD-10-CM

## 2019-10-27 DIAGNOSIS — E1165 Type 2 diabetes mellitus with hyperglycemia: Secondary | ICD-10-CM

## 2019-10-27 DIAGNOSIS — I340 Nonrheumatic mitral (valve) insufficiency: Secondary | ICD-10-CM

## 2019-10-27 DIAGNOSIS — Z6831 Body mass index (BMI) 31.0-31.9, adult: Secondary | ICD-10-CM

## 2019-10-27 DIAGNOSIS — Z1159 Encounter for screening for other viral diseases: Secondary | ICD-10-CM

## 2019-10-27 DIAGNOSIS — N189 Chronic kidney disease, unspecified: Secondary | ICD-10-CM

## 2019-10-27 DIAGNOSIS — Z23 Encounter for immunization: Secondary | ICD-10-CM

## 2019-10-27 DIAGNOSIS — Z794 Long term (current) use of insulin: Secondary | ICD-10-CM | POA: Diagnosis not present

## 2019-10-27 DIAGNOSIS — E669 Obesity, unspecified: Secondary | ICD-10-CM

## 2019-10-27 DIAGNOSIS — M159 Polyosteoarthritis, unspecified: Secondary | ICD-10-CM

## 2019-10-27 DIAGNOSIS — D649 Anemia, unspecified: Secondary | ICD-10-CM

## 2019-10-27 LAB — BAYER DCA HB A1C WAIVED: HB A1C (BAYER DCA - WAIVED): 7.6 % — ABNORMAL HIGH (ref <7.0)

## 2019-10-27 NOTE — Patient Instructions (Signed)

## 2019-10-27 NOTE — Progress Notes (Signed)
Subjective:    Patient ID: [name], female    DOB: 08/29/1950, 69 y.o.   MRN: [id number]  Chief Complaint  Patient presents with  . Medical Management of Chronic Issues   Pt presents to the office today to establish care. She is followed Cardiologists annually for mitral insufficiency.  Diabetes She presents for her follow-up diabetic visit. She has type 2 diabetes mellitus. Her disease course has been stable. There are no hypoglycemic associated symptoms. Associated symptoms include fatigue and foot paresthesias. Pertinent negatives for diabetes include no blurred vision. There are no hypoglycemic complications. Symptoms are stable. Diabetic complications include heart disease. Pertinent negatives for diabetic complications include no nephropathy or peripheral neuropathy. Risk factors for coronary artery disease include dyslipidemia, diabetes mellitus, hypertension, sedentary lifestyle and post-menopausal. She is following a generally unhealthy diet. Her overall blood glucose range is 130-140 mg/dl. An ACE inhibitor/angiotensin II receptor blocker is being taken. Eye exam is current.  Asthma She complains of cough and wheezing. There is no sputum production. This is a chronic problem. The current episode started more than 1 year ago. The problem occurs intermittently. The problem has been waxing and waning. Associated symptoms include malaise/fatigue. Her symptoms are aggravated by pollen. Her symptoms are alleviated by rest. She reports moderate improvement on treatment. Her past medical history is significant for asthma.  Arthritis Presents for follow-up visit. She complains of pain and stiffness. The symptoms have been stable. Affected locations include the right knee, left knee, left hip and right hip. Her pain is at a severity of 10/10. Associated symptoms include fatigue.  Thyroid Problem Presents for follow-up visit. Symptoms include dry skin and fatigue. Patient reports no  constipation, depressed mood or diaphoresis. The symptoms have been stable. Her past medical history is significant for hyperlipidemia.  Hyperlipidemia This is a chronic problem. The current episode started more than 1 year ago. The problem is controlled. Recent lipid tests were reviewed and are normal. Exacerbating diseases include obesity. Risk factors for coronary artery disease include diabetes mellitus, dyslipidemia, hypertension and a sedentary lifestyle.  Anemia Presents for follow-up visit. Symptoms include malaise/fatigue.      Review of Systems  Constitutional: Positive for fatigue and malaise/fatigue. Negative for diaphoresis.  Eyes: Negative for blurred vision.  Respiratory: Positive for cough and wheezing. Negative for sputum production.   Gastrointestinal: Negative for constipation.  Musculoskeletal: Positive for arthritis and stiffness.  All other systems reviewed and are negative.      Objective:   Physical Exam Vitals reviewed.  Constitutional:      General: She is not in acute distress.    Appearance: She is well-developed.  HENT:     Head: Normocephalic and atraumatic.     Right Ear: Tympanic membrane normal.     Left Ear: Tympanic membrane normal.  Eyes:     Pupils: Pupils are equal, round, and reactive to light.  Neck:     Thyroid: No thyromegaly.  Cardiovascular:     Rate and Rhythm: Normal rate and regular rhythm.     Heart sounds: Normal heart sounds. No murmur.  Pulmonary:     Effort: Pulmonary effort is normal. No respiratory distress.     Breath sounds: Normal breath sounds. No wheezing.  Abdominal:     General: Bowel sounds are normal. There is no distension.     Palpations: Abdomen is soft.     Tenderness: There is no abdominal tenderness.  Musculoskeletal:        General: No  tenderness. Normal range of motion.     Cervical back: Normal range of motion and neck supple.  Skin:    General: Skin is warm and dry.  Neurological:     Mental  Status: She is alert and oriented to person, place, and time.     Cranial Nerves: No cranial nerve deficit.     Deep Tendon Reflexes: Reflexes are normal and symmetric.  Psychiatric:        Behavior: Behavior normal.        Thought Content: Thought content normal.        Judgment: Judgment normal.       BP (!) 144/79   Pulse 100   Temp (!) 96.9 F (36.1 C) (Temporal)   Ht _0  (1.626 m)   Wt 184 lb 3.2 oz (83.6 kg)   SpO2 96%   BMI 31.62 kg/m      Assessment & Plan:  XARIAH SILVERNAIL comes in today with chief complaint of Medical Management of Chronic Issues   Diagnosis and orders addressed:  1. Essential hypertension - CMP14+EGFR  2. Moderate mitral insufficiency - CMP14+EGFR  3. Mild intermittent asthma, unspecified whether complicated  - DTO67+TIWP  4. Type 2 diabetes mellitus with hyperglycemia, with long-term current use of insulin (HCC) - Bayer DCA Hb A1c Waived - CMP14+EGFR  5. Hypothyroidism, unspecified type - CMP14+EGFR - TSH  6. Generalized OA - CMP14+EGFR  7. Chronic kidney disease, unspecified CKD stage - CMP14+EGFR  8. Anemia, unspecified type If iron is normal, will stop iron as she is complaining of constipation  - CMP14+EGFR - Anemia Profile B  9. Generalized anxiety disorder - CMP14+EGFR  10. Class 1 obesity with serious comorbidity and body mass index (BMI) of 31.0 to 31.9 in adult, unspecified obesity type - CMP14+EGFR  11. Need for Tdap vaccination - Tdap vaccine greater than or equal to 7yo IM - CMP14+EGFR  12. Obesity (BMI 30-39.9)  13. Status post total replacement of hip, unspecified laterality  14. Vitamin D deficiency  15. Need for hepatitis C screening test - Hepatitis C antibody   Labs pending Health Maintenance reviewed-TDAP given today Diet and exercise encouraged  Follow up plan: 4 months   Evelina Dun, FNP

## 2019-10-28 ENCOUNTER — Other Ambulatory Visit: Payer: Self-pay | Admitting: Family

## 2019-10-28 LAB — CMP14+EGFR
ALT: 11 IU/L (ref 0–32)
AST: 16 IU/L (ref 0–40)
Albumin/Globulin Ratio: 1.6 (ref 1.2–2.2)
Albumin: 4.8 g/dL (ref 3.8–4.8)
Alkaline Phosphatase: 198 IU/L — ABNORMAL HIGH (ref 48–121)
BUN/Creatinine Ratio: 16 (ref 12–28)
BUN: 23 mg/dL (ref 8–27)
Bilirubin Total: 0.3 mg/dL (ref 0.0–1.2)
CO2: 23 mmol/L (ref 20–29)
Calcium: 10.1 mg/dL (ref 8.7–10.3)
Chloride: 103 mmol/L (ref 96–106)
Creatinine, Ser: 1.46 mg/dL — ABNORMAL HIGH (ref 0.57–1.00)
GFR calc Af Amer: 43 mL/min/{1.73_m2} — ABNORMAL LOW (ref >59)
GFR calc non Af Amer: 37 mL/min/{1.73_m2} — ABNORMAL LOW (ref >59)
Globulin, Total: 3 g/dL (ref 1.5–4.5)
Glucose: 64 mg/dL — ABNORMAL LOW (ref 65–99)
Potassium: 5 mmol/L (ref 3.5–5.2)
Sodium: 142 mmol/L (ref 134–144)
Total Protein: 7.8 g/dL (ref 6.0–8.5)

## 2019-10-28 LAB — ANEMIA PROFILE B
Basophils Absolute: 0 10*3/uL (ref 0.0–0.2)
Basos: 1 %
EOS (ABSOLUTE): 0.1 10*3/uL (ref 0.0–0.4)
Eos: 1 %
Ferritin: 77 ng/mL (ref 15–150)
Folate: 8.6 ng/mL (ref >3.0)
Hematocrit: 42.6 % (ref 34.0–46.6)
Hemoglobin: 14.1 g/dL (ref 11.1–15.9)
Immature Grans (Abs): 0 10*3/uL (ref 0.0–0.1)
Immature Granulocytes: 0 %
Iron Saturation: 14 % — ABNORMAL LOW (ref 15–55)
Iron: 45 ug/dL (ref 27–139)
Lymphocytes Absolute: 1.6 10*3/uL (ref 0.7–3.1)
Lymphs: 21 %
MCH: 25 pg — ABNORMAL LOW (ref 26.6–33.0)
MCHC: 33.1 g/dL (ref 31.5–35.7)
MCV: 76 fL — ABNORMAL LOW (ref 79–97)
Monocytes Absolute: 0.5 10*3/uL (ref 0.1–0.9)
Monocytes: 7 %
Neutrophils Absolute: 5.6 10*3/uL (ref 1.4–7.0)
Neutrophils: 70 %
Platelets: 275 10*3/uL (ref 150–450)
RBC: 5.63 x10E6/uL — ABNORMAL HIGH (ref 3.77–5.28)
RDW: 14.2 % (ref 11.7–15.4)
Retic Ct Pct: 1.4 % (ref 0.6–2.6)
Total Iron Binding Capacity: 316 ug/dL (ref 250–450)
UIBC: 271 ug/dL (ref 118–369)
Vitamin B-12: 1360 pg/mL — ABNORMAL HIGH (ref 232–1245)
WBC: 8 10*3/uL (ref 3.4–10.8)

## 2019-10-28 LAB — TSH: TSH: 4.72 u[IU]/mL — ABNORMAL HIGH (ref 0.450–4.500)

## 2019-10-28 LAB — HEPATITIS C ANTIBODY: Hep C Virus Ab: <0.1 s/co ratio (ref 0.0–0.9)

## 2019-10-28 MED ORDER — LEVOTHYROXINE SODIUM 75 MCG PO TABS
75.0000 ug | ORAL_TABLET | Freq: Every day | ORAL | 1 refills | Status: DC
Start: 2019-10-28 — End: 2020-04-15

## 2019-10-29 ENCOUNTER — Other Ambulatory Visit: Payer: Self-pay | Admitting: *Deleted

## 2019-10-29 DIAGNOSIS — R748 Abnormal levels of other serum enzymes: Secondary | ICD-10-CM

## 2019-10-29 LAB — SPECIMEN STATUS REPORT

## 2019-10-29 LAB — GAMMA GT: GGT: 23 IU/L (ref 0–60)

## 2019-11-02 ENCOUNTER — Other Ambulatory Visit: Payer: Self-pay | Admitting: Family

## 2019-11-04 ENCOUNTER — Ambulatory Visit: Payer: Medicare Other | Admitting: Physician Assistant

## 2019-11-04 ENCOUNTER — Ambulatory Visit: Payer: Medicare Other | Admitting: Family

## 2019-11-06 ENCOUNTER — Telehealth: Payer: Self-pay | Admitting: Family

## 2019-11-06 MED ORDER — VITAMIN D (ERGOCALCIFEROL) 1.25 MG (50000 UNIT) PO CAPS: ORAL_CAPSULE | ORAL | 3 refills | Status: DC

## 2019-11-06 NOTE — Telephone Encounter (Signed)
Prescription sent to pharmacy.

## 2019-11-06 NOTE — Telephone Encounter (Signed)
Left detailed message.   

## 2019-11-20 ENCOUNTER — Ambulatory Visit (INDEPENDENT_AMBULATORY_CARE_PROVIDER_SITE_OTHER): Payer: Medicare Other | Admitting: *Deleted

## 2019-11-20 DIAGNOSIS — Z0000 Encounter for general adult medical examination without abnormal findings: Secondary | ICD-10-CM | POA: Diagnosis not present

## 2019-11-20 NOTE — Progress Notes (Addendum)
MEDICARE ANNUAL WELLNESS VISIT  11/20/2019  Telephone Visit Disclaimer This Medicare AWV was conducted by telephone due to national recommendations for restrictions regarding the COVID-19 Pandemic (e.g. social distancing).  I verified, using two identifiers, that I am speaking with [name] or their authorized healthcare agent. I discussed the limitations, risks, security, and privacy concerns of performing an evaluation and management service by telephone and the potential availability of an in-person appointment in the future. The patient expressed understanding and agreed to proceed.   Subjective:  [name] is a 69 y.o. female patient of Hawks, Theador Hawthorne, FNP who had a Medicare Annual Wellness Visit today via telephone. [name] is retired and lives with her sister. She is single and does not have any children. She reports that she is socially active and does interact with friends/family regularly. She is not physically active and enjoys reading her bible, listening to music, and watching animal programs.   Patient Care Team: Sharion Balloon, FNP as PCP - General (Family Medicine) Herminio Commons, MD as PCP - Cardiology (Cardiology) Garald Balding, MD as Consulting Physician (Orthopedic Surgery) Danie Binder, MD (Inactive) as Consulting Physician (Gastroenterology)  Advanced Directives 11/20/2019 04/29/2019 04/25/2019 11/19/2018 04/02/2018 01/21/2018 01/18/2018  Does Patient Have a Medical Advance Directive? No No No No No Yes Yes  Type of Advance Directive - - - - - (No Data) (No Data)  Does patient want to make changes to medical advance directive? - - - - - No - Patient declined No - Patient declined  Copy of [place] in Chart? - - - - - - -  Would patient like information on creating a medical advance directive? No - Patient declined No - Patient declined Yes (MAU/Ambulatory/Procedural Areas - Information given) No - Patient declined No - Patient  declined - -    Hospital Utilization Over the Past 12 Months: # of hospitalizations or ER visits: 0 # of surgeries: 0  Review of Systems    Patient reports that her overall health is better compared to last year.  History obtained from the patient.  Patient Reported Readings (BP, Pulse, CBG, Weight, etc) none  Pain Assessment Pain : 0-10 Pain Score: 8  Pain Type: Chronic pain Pain Location: Other (Comment) (shoulders hips arms legs) Pain Descriptors / Indicators: Aching, Dull, Sharp, Throbbing Pain Onset: More than a month ago Pain Frequency: Intermittent Pain Relieving Factors: tylenol Effect of Pain on Daily Activities: no  Pain Relieving Factors: tylenol  Current Medications & Allergies (verified) Allergies as of 11/20/2019       Reactions   Other Itching, Nausea Only, Other (See Comments)   Acidic foods cause stomach upset and itching   Aspirin Other (See Comments)   Can only take 81 - full strength will give acid reflux   Allopurinol Rash   Penicillins Rash   Has patient had a PCN reaction causing immediate rash, facial/tongue/throat swelling, SOB or lightheadedness with hypotension: unkn Has patient had a PCN reaction causing severe rash involving mucus membranes or skin necrosis: unkn Has patient had a PCN reaction that required hospitalization: unkn Has patient had a PCN reaction occurring within the last 10 years: unkn If all of the above answers are "NO", then may proceed with Cephalosporin use.   Sulfa Antibiotics Rash        Medication List        Accurate as of November 20, 2019 10:25 AM. If you have any  questions, ask your nurse or doctor.          STOP taking these medications    omeprazole 20 MG capsule Commonly known as: PRILOSEC       TAKE these medications    acetaminophen 500 MG tablet Commonly known as: TYLENOL Take 1,000 mg by mouth every 8 (eight) hours as needed (pain).   albuterol 108 (90 Base) MCG/ACT inhaler Commonly known  as: VENTOLIN HFA Inhale 2 puffs into the lungs every 6 (six) hours as needed for wheezing or shortness of breath.   alendronate 70 MG tablet Commonly known as: FOSAMAX Take 1 tablet (70 mg total) by mouth every 7 (seven) days. Take with a full glass of water on an empty stomach.   atorvastatin 20 MG tablet Commonly known as: LIPITOR Take 1 tablet (20 mg total) by mouth daily.   Biofreeze Colorless 4 % Gel Generic drug: Menthol (Topical Analgesic) Apply 1 application topically daily as needed (pain). Foot, Legs and Knees   busPIRone 5 MG tablet Commonly known as: BUSPAR TAKE 1-2 TABLETS (5-10 MG TOTAL) BY MOUTH 2 (TWO) TIMES DAILY.   cetirizine 10 MG tablet Commonly known as: ZYRTEC Take 20 mg by mouth daily.   citalopram 20 MG tablet Commonly known as: CELEXA TAKE 1 TABLET BY MOUTH TWICE A DAY   cloNIDine 0.1 MG tablet Commonly known as: CATAPRES Take 1 tablet (0.1 mg total) by mouth daily. What changed: when to take this   docusate sodium 100 MG capsule Commonly known as: COLACE Take 100 mg by mouth 2 (two) times daily.   esomeprazole 40 MG capsule Commonly known as: NexIUM Take 1 capsule (40 mg total) by mouth daily.   fluticasone 50 MCG/ACT nasal spray Commonly known as: FLONASE Place 1 spray into both nostrils 2 (two) times daily as needed for allergies or rhinitis.   gabapentin 100 MG capsule Commonly known as: NEURONTIN Take 2 capsules (200 mg total) by mouth 3 (three) times daily.   glucose blood test strip Commonly known as: ONE TOUCH ULTRA TEST TEST TWICE A DAY   hydrOXYzine 25 MG tablet Commonly known as: ATARAX/VISTARIL TAKE 1 TABLET (25 MG TOTAL) BY MOUTH 3 (THREE) TIMES DAILY. TAKE ONE AT BEDTIME What changed: additional instructions   insulin glargine 100 UNIT/ML injection Commonly known as: Lantus INJECT 30-40 INTO THE SKIN DAILY.   insulin lispro 100 UNIT/ML injection Commonly known as: HumaLOG Inject 0.04-0.1 mLs (4-10 Units total) into  the skin 3 (three) times daily with meals. Sliding Scale   Insulin Syringe-Needle U-100 31G X 5/16" 0.5 ML Misc Commonly known as: BD Insulin Syringe U/F USE TO INJECT LANTUS AND NOVOLOG DAILY   Jardiance 10 MG Tabs tablet Generic drug: empagliflozin Take 10 mg by mouth daily before breakfast.   levothyroxine 75 MCG tablet Commonly known as: Synthroid Take 1 tablet (75 mcg total) by mouth daily.   Magnesium 400 MG Tabs Take 400 mg by mouth daily.   metFORMIN 500 MG tablet Commonly known as: GLUCOPHAGE Take 1 tablet (500 mg total) by mouth 2 (two) times daily with a meal.   metoprolol succinate 25 MG 24 hr tablet Commonly known as: TOPROL-XL TAKE 1 TABLET BY MOUTH EVERY DAY   montelukast 10 MG tablet Commonly known as: SINGULAIR TAKE 1 TABLET BY MOUTH ONCE DAILY IN THE EVENING What changed:  how much to take how to take this when to take this additional instructions   multivitamin with minerals tablet Take 1 tablet by mouth daily.  Centrum Silver   Hardin Negus Milk of Magnesia 400 MG/5ML suspension Generic drug: magnesium hydroxide Take 60 mLs by mouth See admin instructions. Every 3 days - [name]   quinapril 5 MG tablet Commonly known as: ACCUPRIL TAKE 2 TABLETS BY MOUTH EVERY DAY   SYSTANE BALANCE OP Place 1 drop into both eyes daily as needed (Dry eyes).   umeclidinium-vilanterol 62.5-25 MCG/INH Aepb Commonly known as: Anoro Ellipta Inhale 1 puff into the lungs daily.   Vitamin D (Ergocalciferol) 1.25 MG (50000 UNIT) Caps capsule Commonly known as: DRISDOL TAKE 1 CAPSULE BY MOUTH  EACH WEEK   Vitron-C 65-125 MG Tabs Generic drug: Iron-Vitamin C Take 1 tablet by mouth daily.        History (reviewed): Past Medical History:  Diagnosis Date   Allergic rhinitis    Allergy    Anemia    Ankle swelling    Anxiety    takes Xanax daily    Arthritis    Asthma    Blurred vision, bilateral    Chronic kidney disease    Depression    takes Celexa daily    Diabetes mellitus without complication ([place])    type II; takes Actos daily   GERD (gastroesophageal reflux disease)    Gout    Headache    Heart murmur    Heart palpitations    Hemorrhoids    History of blood transfusion    no abnormal reaction noted   History of bronchitis    Hyperlipidemia    Hypertension    takes Catapres and Quinapril daily   Hypothyroidism    takes Synthroid daily   Moderate mitral insufficiency    a. 12/2014 Echo: EF 55-60%, mod MR, mildly to moderately dil LA, PASP 32mmHg.   Myocardial infarction [facility])    " Minor"   Osteoarthritis    Paroxysmal supraventricular tachycardia (HCC)    Recurrent ventral incisional hernia    Ringing in ears    Shortness of breath dyspnea    ALbuterol daily as needed   Stevens-Johnson disease ([place])    Urinary incontinence    Wears glasses    Past Surgical History:  Procedure Laterality Date   ABDOMINAL HYSTERECTOMY     BOWEL RESECTION     COLON SURGERY     COLONOSCOPY     COLONOSCOPY WITH PROPOFOL N/A 04/29/2019   Procedure: COLONOSCOPY WITH PROPOFOL;  Surgeon: Danie Binder, MD;  [facility];  Service: Endoscopy;  Laterality: N/A;  7:30am   ESOPHAGOGASTRODUODENOSCOPY     HEMORRHOID SURGERY  1998   HERNIA REPAIR     hiatal hernia repair   HIATAL HERNIA REPAIR  1980   pt is unsure of exact date but thinks it was early 44's   [place] N/A 04/02/2018   Procedure: OPEN VENTRAL HERNIA REPAIR WITH MESH, Explantation of hernia mesh;  Surgeon: Donnie Mesa, MD;  Location: [place];  Service: General;  Laterality: N/A;   INSERTION OF MESH N/A 04/02/2018   Procedure: INSERTION OF MESH;  Surgeon: Donnie Mesa, MD;  Location: [place];  Service: General;  Laterality: N/A;   POLYPECTOMY  04/29/2019   Procedure: POLYPECTOMY;  Surgeon: Danie Binder, MD;  [facility];  Service: Endoscopy;;   TOTAL HIP ARTHROPLASTY Left 02/02/2015   Procedure: LEFT TOTAL HIP ARTHROPLASTY;  Surgeon: Garald Balding, MD;  Location: [place];  Service: Orthopedics;  Laterality: Left;   TOTAL HIP ARTHROPLASTY Right 02/01/2016   Procedure: TOTAL HIP ARTHROPLASTY;  Surgeon: Garald Balding, MD;  Location: [place];  Service: Orthopedics;  Laterality: Right;   Family History  Problem Relation Age of Onset   Heart attack Mother    Kidney disease Mother    Diabetes Mother    Hypertension Mother    Colon polyps Mother    Cancer Father        Widespread; unknown primary   COPD Father    Hypertension Sister    Hyperlipidemia Sister    Colon cancer Maternal Aunt    Colon cancer Paternal Aunt    Social History   Socioeconomic History   Marital status: Single    Spouse name: Not on file   Number of children: 0   Years of education: 12   Highest education level: High school graduate  Occupational History   Occupation: retired  Tobacco Use   Smoking status: Never Smoker   Smokeless tobacco: Never Used  Scientific laboratory technician Use: Never used  Substance and Sexual Activity   Alcohol use: No    Alcohol/week: 0.0 standard drinks   Drug use: No   Sexual activity: Not Currently    Birth control/protection: Surgical  Other Topics Concern   Not on file  Social History Narrative   Not on file   Social Determinants of Health   Financial Resource Strain:    Difficulty of Paying Living Expenses:   Food Insecurity:    Worried About Charity fundraiser in the Last Year:    Arboriculturist in the Last Year:   Transportation Needs:    Film/video editor (Medical):    Lack of Transportation (Non-Medical):   Physical Activity:    Days of Exercise per Week:    Minutes of Exercise per Session:   Stress:    Feeling of Stress :   Social Connections:    Frequency of Communication with Friends and Family:    Frequency of Social Gatherings with Friends and Family:    Attends Religious Services:    Active Member of Clubs or Organizations:    Attends Archivist Meetings:    Marital  Status:     Activities of Daily Living In your present state of health, do you have any difficulty performing the following activities: 11/20/2019 04/25/2019  Hearing? N N  Vision? N N  Difficulty concentrating or making decisions? Y N  Comment concentration and memory problems once diagnosed with type 2 diabetes -  Walking or climbing stairs? Y Y  Comment hx of bilateral hip surgery -  Dressing or bathing? N N  Doing errands, shopping? Y N  Comment prefers not being alone Facilities manager and eating ? N -  Using the Toilet? N -  In the past six months, have you accidently leaked urine? Y -  Comment wears incontenence underwear or pads -  Do you have problems with loss of bowel control? N -  Managing your Medications? N -  Managing your Finances? N -  Housekeeping or managing your Housekeeping? Y -  Comment hip pain and arthritis -  Some recent data might be hidden    Patient Education/ Literacy How often do you need to have someone help you when you read instructions, pamphlets, or other written materials from your doctor or pharmacy?: 1 - Never  Exercise Current Exercise Habits: The patient does not participate in regular exercise at present, Exercise limited by: orthopedic condition(s) (hx of bilateral hip surgery)  Diet Patient reports  consuming 4 small meals per day. Patient reports that her primary diet is: Diabetic Patient reports that she does have regular access to food.   Depression Screen PHQ 2/9 Scores 11/20/2019 10/27/2019 11/19/2018 05/01/2018 02/27/2018 01/28/2018 11/23/2017  PHQ - 2 Score 2 0 0 0 2 2 2   PHQ- 9 Score 10 - - - 5 6 6      Fall Risk Fall Risk  11/20/2019 10/27/2019 11/19/2018 05/01/2018 01/28/2018  Falls in the past year? 0 0 0 0 No     Objective:  [name] seemed alert and oriented and she participated appropriately during our telephone visit.  Blood Pressure Weight BMI  BP Readings from Last 3 Encounters:  10/27/19 (!) 144/79  06/24/19  138/72  04/29/19 (!) 164/86   Wt Readings from Last 3 Encounters:  10/27/19 184 lb 3.2 oz (83.6 kg)  06/24/19 184 lb (83.5 kg)  04/29/19 185 lb (83.9 kg)   BMI Readings from Last 1 Encounters:  10/27/19 31.62 kg/m    *Unable to obtain current vital signs, weight, and BMI due to telephone visit type  Hearing/Vision  [name] did not seem to have difficulty with hearing/understanding during the telephone conversation Reports that she has had a formal eye exam by an eye care professional within the past year Reports that she has not had a formal hearing evaluation within the past year *Unable to fully assess hearing and vision during telephone visit type  Cognitive Function: 6CIT Screen 11/20/2019 11/19/2018  What Year? 0 points 0 points  What month? 0 points 0 points  What time? 0 points 0 points  Count back from 20 0 points 0 points  Months in reverse 0 points 0 points  Repeat phrase 2 points 0 points  Total Score 2 0   (Normal:0-7, Significant for Dysfunction: >8)  Normal Cognitive Function Screening: Yes   Immunization & Health Maintenance Record Immunization History  Administered Date(s) Administered   Influenza, High Dose Seasonal PF 04/05/2018, 02/21/2019   Influenza,inj,Quad PF,6+ Mos 04/13/2016   Janssen (J&J) SARS-COV-2 Vaccination 09/18/2019   Pneumococcal Conjugate-13 05/01/2018   Pneumococcal Polysaccharide-23 02/04/2016   Tdap 10/27/2019   Zoster Recombinat (Shingrix) 01/10/2019, 03/19/2019    Health Maintenance  Topic Date Due   MAMMOGRAM  10/26/2020 (Originally 11/23/2013)   INFLUENZA VACCINE  01/11/2020   HEMOGLOBIN A1C  04/28/2020   OPHTHALMOLOGY EXAM  09/10/2020   FOOT EXAM  10/26/2020   PNA vac Low Risk Adult (2 of 2 - PPSV23) 02/03/2021   COLONOSCOPY  04/28/2029   TETANUS/TDAP  10/26/2029   DEXA SCAN  Completed   COVID-19 Vaccine  Completed   Hepatitis C Screening  Completed       Assessment  This is a routine wellness examination for [name].  Health Maintenance: Due or Overdue There are no preventive care reminders to display for this patient.  [name] does not need a referral for Community Assistance: Care Management:   no Social Work:    no Prescription Assistance:  no Nutrition/Diabetes Education:  no   Plan:  Personalized Goals Goals Addressed             This Visit's Progress    Patient Stated       11/20/2019 AWV Goal: Exercise for General Health  Patient will verbalize understanding of the benefits of increased physical activity: Exercising regularly is important. It will improve your overall fitness, flexibility, and endurance. Regular exercise also will improve your overall health. It can help you  control your weight, reduce stress, and improve your bone density. Over the next year, patient will increase physical activity as tolerated with a goal of at least 150 minutes of moderate physical activity per week.  You can tell that you are exercising at a moderate intensity if your heart starts beating faster and you start breathing faster but can still hold a conversation. Moderate-intensity exercise ideas include: Walking 1 mile (1.6 km) in about 15 minutes Biking Hiking Golfing Dancing Water aerobics Patient will verbalize understanding of everyday activities that increase physical activity by providing examples like the following: Yard work, such as: Sales promotion account executive Gardening Washing windows or floors Patient will be able to explain general safety guidelines for exercising:  Before you start a new exercise program, talk with your health care provider. Do not exercise so much that you hurt yourself, feel dizzy, or get very short of breath. Wear comfortable clothes and wear shoes with Bright support. Drink plenty of water while you exercise to prevent dehydration or heat stroke. Work out until your breathing  and your heartbeat get faster.        Personalized Health Maintenance & Screening Recommendations    Lung Cancer Screening Recommended: no (Low Dose CT Chest recommended if Age 42-80 years, 30 pack-year currently smoking OR have quit w/in past 15 years) Hepatitis C Screening recommended: no HIV Screening recommended: no  Advanced Directives: Written information was not prepared per patient's request.  Referrals & Orders No orders of the defined types were placed in this encounter.   Follow-up Plan Follow-up with Sharion Balloon, FNP as planned   I have personally reviewed and noted the following in the patient's chart:   Medical and social history Use of alcohol, tobacco or illicit drugs  Current medications and supplements Functional ability and status Nutritional status Physical activity Advanced directives List of other physicians Hospitalizations, surgeries, and ER visits in previous 12 months Vitals Screenings to include cognitive, depression, and falls Referrals and appointments  In addition, I have reviewed and discussed with [name] certain preventive protocols, quality metrics, and best practice recommendations. A written personalized care plan for preventive services as well as general preventive health recommendations is available and can be mailed to the patient at her request.      Baldomero Lamy, LPN  4/43/1540   I have reviewed and agree with the above AWV documentation.   Evelina Dun, FNP

## 2019-11-21 ENCOUNTER — Other Ambulatory Visit: Payer: Self-pay | Admitting: Internal Medicine

## 2019-11-24 ENCOUNTER — Other Ambulatory Visit: Payer: Self-pay | Admitting: *Deleted

## 2019-11-24 DIAGNOSIS — I10 Essential (primary) hypertension: Secondary | ICD-10-CM

## 2019-11-24 MED ORDER — CLONIDINE HCL 0.1 MG PO TABS: 0.1000 mg | ORAL_TABLET | Freq: Every day | ORAL | 1 refills | Status: DC

## 2019-11-28 ENCOUNTER — Telehealth: Payer: Self-pay | Admitting: Family

## 2019-11-28 NOTE — Telephone Encounter (Signed)
Records release sent 

## 2019-12-02 ENCOUNTER — Other Ambulatory Visit: Payer: Self-pay | Admitting: *Deleted

## 2019-12-02 DIAGNOSIS — I10 Essential (primary) hypertension: Secondary | ICD-10-CM

## 2019-12-02 MED ORDER — METOPROLOL SUCCINATE ER 25 MG PO TB24: 25.0000 mg | ORAL_TABLET | Freq: Every day | ORAL | 0 refills | Status: DC

## 2019-12-03 ENCOUNTER — Other Ambulatory Visit: Payer: Self-pay | Admitting: Family

## 2019-12-04 ENCOUNTER — Other Ambulatory Visit: Payer: Self-pay | Admitting: Family Medicine

## 2019-12-08 ENCOUNTER — Other Ambulatory Visit: Payer: Self-pay | Admitting: Family

## 2019-12-23 ENCOUNTER — Other Ambulatory Visit: Payer: Self-pay | Admitting: *Deleted

## 2019-12-23 MED ORDER — ALBUTEROL SULFATE HFA 108 (90 BASE) MCG/ACT IN AERS: 2.0000 | INHALATION_SPRAY | Freq: Four times a day (QID) | RESPIRATORY_TRACT | 0 refills | Status: DC | PRN

## 2019-12-25 ENCOUNTER — Other Ambulatory Visit: Payer: Self-pay | Admitting: Family

## 2020-01-05 ENCOUNTER — Other Ambulatory Visit: Payer: Self-pay | Admitting: *Deleted

## 2020-01-05 DIAGNOSIS — F411 Generalized anxiety disorder: Secondary | ICD-10-CM

## 2020-01-05 MED ORDER — CITALOPRAM HYDROBROMIDE 20 MG PO TABS: 20.0000 mg | ORAL_TABLET | Freq: Two times a day (BID) | ORAL | 0 refills | Status: DC

## 2020-01-06 MED ORDER — HYDROXYZINE HCL 25 MG PO TABS: 25.0000 mg | ORAL_TABLET | Freq: Three times a day (TID) | ORAL | 1 refills | Status: DC

## 2020-01-08 ENCOUNTER — Other Ambulatory Visit: Payer: Self-pay | Admitting: Family

## 2020-01-08 DIAGNOSIS — E1065 Type 1 diabetes mellitus with hyperglycemia: Secondary | ICD-10-CM

## 2020-01-26 ENCOUNTER — Telehealth: Payer: Self-pay | Admitting: *Deleted

## 2020-01-26 DIAGNOSIS — E1065 Type 1 diabetes mellitus with hyperglycemia: Secondary | ICD-10-CM

## 2020-01-26 MED ORDER — INSULIN LISPRO 100 UNIT/ML ~~LOC~~ SOLN: 4.0000 [IU] | Freq: Three times a day (TID) | SUBCUTANEOUS | 0 refills | Status: DC

## 2020-01-26 NOTE — Telephone Encounter (Signed)
Fax from [facility] pharmacy RF request for Furosemide 40 mg tab 1 QD #90 Not on current med list Last OV 5/17/21Next OV 03/02/20

## 2020-01-26 NOTE — Telephone Encounter (Signed)
Not on current med list.

## 2020-01-29 ENCOUNTER — Other Ambulatory Visit: Payer: Self-pay | Admitting: Family

## 2020-01-29 DIAGNOSIS — I10 Essential (primary) hypertension: Secondary | ICD-10-CM

## 2020-01-30 NOTE — Telephone Encounter (Signed)
Not on current meds list. Please review and advise

## 2020-02-01 ENCOUNTER — Other Ambulatory Visit: Payer: Self-pay | Admitting: Family

## 2020-02-09 ENCOUNTER — Telehealth: Payer: Self-pay | Admitting: Family

## 2020-02-09 ENCOUNTER — Other Ambulatory Visit: Payer: Self-pay

## 2020-02-09 DIAGNOSIS — E1065 Type 1 diabetes mellitus with hyperglycemia: Secondary | ICD-10-CM

## 2020-02-09 MED ORDER — EMPAGLIFLOZIN 10 MG PO TABS: 10.0000 mg | ORAL_TABLET | Freq: Every day | ORAL | 0 refills | Status: DC

## 2020-02-09 MED ORDER — GLUCOSE BLOOD VI STRP: ORAL_STRIP | 12 refills | Status: DC

## 2020-02-09 NOTE — Telephone Encounter (Signed)
Patient aware that this office does not have any samples of inhaler spacer at this time.

## 2020-02-20 ENCOUNTER — Other Ambulatory Visit: Payer: Self-pay | Admitting: Family

## 2020-02-20 DIAGNOSIS — E1065 Type 1 diabetes mellitus with hyperglycemia: Secondary | ICD-10-CM

## 2020-02-24 ENCOUNTER — Other Ambulatory Visit: Payer: Self-pay | Admitting: Family

## 2020-02-24 DIAGNOSIS — I10 Essential (primary) hypertension: Secondary | ICD-10-CM

## 2020-03-02 ENCOUNTER — Ambulatory Visit (INDEPENDENT_AMBULATORY_CARE_PROVIDER_SITE_OTHER): Payer: Medicare Other | Admitting: Family

## 2020-03-02 ENCOUNTER — Encounter: Payer: Self-pay | Admitting: Family

## 2020-03-02 ENCOUNTER — Other Ambulatory Visit: Payer: Self-pay

## 2020-03-02 ENCOUNTER — Telehealth: Payer: Self-pay | Admitting: Family Medicine

## 2020-03-02 VITALS — BP 123/78 | HR 106 | Temp 97.1°F | Ht 64.0 in | Wt 187.8 lb

## 2020-03-02 DIAGNOSIS — E1065 Type 1 diabetes mellitus with hyperglycemia: Secondary | ICD-10-CM | POA: Diagnosis not present

## 2020-03-02 DIAGNOSIS — I10 Essential (primary) hypertension: Secondary | ICD-10-CM | POA: Diagnosis not present

## 2020-03-02 DIAGNOSIS — E039 Hypothyroidism, unspecified: Secondary | ICD-10-CM

## 2020-03-02 DIAGNOSIS — E1122 Type 2 diabetes mellitus with diabetic chronic kidney disease: Secondary | ICD-10-CM | POA: Diagnosis not present

## 2020-03-02 DIAGNOSIS — J4520 Mild intermittent asthma, uncomplicated: Secondary | ICD-10-CM

## 2020-03-02 DIAGNOSIS — N189 Chronic kidney disease, unspecified: Secondary | ICD-10-CM

## 2020-03-02 DIAGNOSIS — I340 Nonrheumatic mitral (valve) insufficiency: Secondary | ICD-10-CM | POA: Diagnosis not present

## 2020-03-02 DIAGNOSIS — E669 Obesity, unspecified: Secondary | ICD-10-CM

## 2020-03-02 DIAGNOSIS — F411 Generalized anxiety disorder: Secondary | ICD-10-CM

## 2020-03-02 DIAGNOSIS — E1165 Type 2 diabetes mellitus with hyperglycemia: Secondary | ICD-10-CM

## 2020-03-02 DIAGNOSIS — Z794 Long term (current) use of insulin: Secondary | ICD-10-CM | POA: Diagnosis not present

## 2020-03-02 DIAGNOSIS — E559 Vitamin D deficiency, unspecified: Secondary | ICD-10-CM

## 2020-03-02 DIAGNOSIS — M810 Age-related osteoporosis without current pathological fracture: Secondary | ICD-10-CM

## 2020-03-02 LAB — BAYER DCA HB A1C WAIVED: HB A1C (BAYER DCA - WAIVED): 6.9 % (ref <7.0)

## 2020-03-02 MED ORDER — EMPAGLIFLOZIN 25 MG PO TABS: 25.0000 mg | ORAL_TABLET | Freq: Every day | ORAL | 2 refills | Status: DC

## 2020-03-02 MED ORDER — METOPROLOL SUCCINATE ER 25 MG PO TB24: 25.0000 mg | ORAL_TABLET | Freq: Every day | ORAL | 3 refills | Status: DC

## 2020-03-02 NOTE — Progress Notes (Signed)
Subjective:    Patient ID: [name], female    DOB: 02-Apr-1951, 69 y.o.   MRN: [id number]  Chief Complaint  Patient presents with  . Medical Management of Chronic Issues    80mh  . Hypertension  . Diabetes   Pt presents to the office today for chronic follow up. She is followed Cardiologists annually for mitral insufficiency.  She reports she had blurred vision and she stopped her Celexa, Fosamax, and Gabapentin. She states her vision improved after stopping these. She did see her ophthalmologist and was told she needed better control of her blood sugars.  Hypertension This is a chronic problem. The current episode started more than 1 year ago. The problem has been resolved since onset. The problem is controlled. Associated symptoms include blurred vision, malaise/fatigue and shortness of breath. Pertinent negatives include no peripheral edema. Risk factors for coronary artery disease include diabetes mellitus. The current treatment provides moderate improvement. There is no history of CVA or heart failure. Identifiable causes of hypertension include a thyroid problem.  Diabetes She presents for her follow-up diabetic visit. She has type 2 diabetes mellitus. Her disease course has been stable. There are no hypoglycemic associated symptoms. Associated symptoms include blurred vision, fatigue, foot paresthesias ("some times") and visual change. There are no hypoglycemic complications. Symptoms are stable. Diabetic complications include nephropathy and peripheral neuropathy. Pertinent negatives for diabetic complications include no CVA. Risk factors for coronary artery disease include diabetes mellitus, dyslipidemia, hypertension, post-menopausal and sedentary lifestyle. She is following a generally unhealthy diet. Her overall blood glucose range is 110-130 mg/dl. An ACE inhibitor/angiotensin II receptor blocker is being taken. Eye exam is current.  Asthma She complains of shortness of breath  and wheezing. There is no cough. This is a chronic problem. The current episode started more than 1 year ago. The problem occurs intermittently. The problem has been waxing and waning. Associated symptoms include malaise/fatigue. She reports moderate improvement on treatment. Her past medical history is significant for asthma.  Thyroid Problem Presents for follow-up visit. Symptoms include constipation, depressed mood, fatigue and visual change. The symptoms have been stable. There is no history of heart failure.  Arthritis Presents for follow-up visit. Affected locations include the right knee, left knee, left MCP and right MCP. Her pain is at a severity of 8/10. Associated symptoms include fatigue.      Review of Systems  Constitutional: Positive for fatigue and malaise/fatigue.  Eyes: Positive for blurred vision.  Respiratory: Positive for shortness of breath and wheezing. Negative for cough.   Gastrointestinal: Positive for constipation.  Musculoskeletal: Positive for arthritis.  All other systems reviewed and are negative.      Objective:   Physical Exam Vitals reviewed.  Constitutional:      General: She is not in acute distress.    Appearance: She is well-developed. She is obese.  HENT:     Head: Normocephalic and atraumatic.     Right Ear: Tympanic membrane normal.     Left Ear: Tympanic membrane normal.  Eyes:     Pupils: Pupils are equal, round, and reactive to light.  Neck:     Thyroid: No thyromegaly.  Cardiovascular:     Rate and Rhythm: Normal rate and regular rhythm.     Heart sounds: Normal heart sounds. No murmur heard.   Pulmonary:     Effort: Pulmonary effort is normal. No respiratory distress.     Breath sounds: Normal breath sounds. No wheezing.  Abdominal:  General: Bowel sounds are normal. There is no distension.     Palpations: Abdomen is soft.     Tenderness: There is no abdominal tenderness.  Musculoskeletal:        General: No tenderness.  Normal range of motion.     Cervical back: Normal range of motion and neck supple.  Skin:    General: Skin is warm and dry.  Neurological:     Mental Status: She is alert and oriented to person, place, and time.     Cranial Nerves: No cranial nerve deficit.     Deep Tendon Reflexes: Reflexes are normal and symmetric.  Psychiatric:        Behavior: Behavior normal.        Thought Content: Thought content normal.        Judgment: Judgment normal.       BP 123/78   Pulse (!) 106   Temp (!) 97.1 F (36.2 C) (Temporal)   Ht _0  (1.626 m)   Wt 187 lb 12.8 oz (85.2 kg)   SpO2 98%   BMI 32.24 kg/m      Assessment & Plan:  [name] comes in today with chief complaint of Medical Management of Chronic Issues (22mh), Hypertension, and Diabetes   Diagnosis and orders addressed:  1. Type 2 diabetes mellitus with chronic kidney disease, with long-term current use of insulin, unspecified CKD stage (HCC) Will increase Jaridance to 25 mg from 10 mg  -Will decrease insulin if glucose decreases - Bayer DCA Hb A1c Waived - empagliflozin (JARDIANCE) 25 MG TABS tablet; Take 1 tablet (25 mg total) by mouth daily before breakfast.  Dispense: 90 tablet; Refill: 2 - CMP14+EGFR - CBC with Differential/Platelet  2. Essential hypertension - CMP14+EGFR - CBC with Differential/Platelet  3. Moderate mitral insufficiency - CMP14+EGFR - CBC with Differential/Platelet  4. Mild intermittent asthma, unspecified whether complicated - CBSJ62+EZMO- CBC with Differential/Platelet  5. Hypothyroidism, unspecified type - CMP14+EGFR - CBC with Differential/Platelet - TSH  6. Type 2 diabetes mellitus with hyperglycemia, with long-term current use of insulin (HCC) - CMP14+EGFR - CBC with Differential/Platelet  7. Age-related osteoporosis without current pathological fracture - CMP14+EGFR - CBC with Differential/Platelet  8. Chronic kidney disease, unspecified CKD stage - CMP14+EGFR - CBC  with Differential/Platelet  9. Obesity (BMI 30-39.9) - CMP14+EGFR - CBC with Differential/Platelet  10. Vitamin D deficiency - CMP14+EGFR - CBC with Differential/Platelet  11. Generalized anxiety disorder - CMP14+EGFR - CBC with Differential/Platelet   Labs pending Health Maintenance reviewed Diet and exercise encouraged  Follow up plan: 3 months    CEvelina Dun FNP

## 2020-03-02 NOTE — Patient Instructions (Signed)

## 2020-03-02 NOTE — Telephone Encounter (Signed)
Please send a refill for the pt's metoprolol succinate (TOPROL-XL) 25 MG 24 hr tablet [[id number]]  W/ Andy's name please per phone call from the patient-- it has her PCP name on it currently

## 2020-03-03 LAB — CBC WITH DIFFERENTIAL/PLATELET
Basophils Absolute: 0 10*3/uL (ref 0.0–0.2)
Basos: 0 %
EOS (ABSOLUTE): 0.2 10*3/uL (ref 0.0–0.4)
Eos: 2 %
Hematocrit: 43.5 % (ref 34.0–46.6)
Hemoglobin: 13.9 g/dL (ref 11.1–15.9)
Immature Grans (Abs): 0 10*3/uL (ref 0.0–0.1)
Immature Granulocytes: 0 %
Lymphocytes Absolute: 3.1 10*3/uL (ref 0.7–3.1)
Lymphs: 37 %
MCH: 24.4 pg — ABNORMAL LOW (ref 26.6–33.0)
MCHC: 32 g/dL (ref 31.5–35.7)
MCV: 76 fL — ABNORMAL LOW (ref 79–97)
Monocytes Absolute: 0.6 10*3/uL (ref 0.1–0.9)
Monocytes: 7 %
Neutrophils Absolute: 4.6 10*3/uL (ref 1.4–7.0)
Neutrophils: 54 %
Platelets: 295 10*3/uL (ref 150–450)
RBC: 5.7 x10E6/uL — ABNORMAL HIGH (ref 3.77–5.28)
RDW: 13.2 % (ref 11.7–15.4)
WBC: 8.4 10*3/uL (ref 3.4–10.8)

## 2020-03-03 LAB — CMP14+EGFR
ALT: 12 IU/L (ref 0–32)
AST: 15 IU/L (ref 0–40)
Albumin/Globulin Ratio: 1.5 (ref 1.2–2.2)
Albumin: 4.5 g/dL (ref 3.8–4.8)
Alkaline Phosphatase: 169 IU/L — ABNORMAL HIGH (ref 44–121)
BUN/Creatinine Ratio: 18 (ref 12–28)
BUN: 24 mg/dL (ref 8–27)
Bilirubin Total: 0.3 mg/dL (ref 0.0–1.2)
CO2: 21 mmol/L (ref 20–29)
Calcium: 10.9 mg/dL — ABNORMAL HIGH (ref 8.7–10.3)
Chloride: 100 mmol/L (ref 96–106)
Creatinine, Ser: 1.34 mg/dL — ABNORMAL HIGH (ref 0.57–1.00)
GFR calc Af Amer: 47 mL/min/{1.73_m2} — ABNORMAL LOW (ref >59)
GFR calc non Af Amer: 41 mL/min/{1.73_m2} — ABNORMAL LOW (ref >59)
Globulin, Total: 3 g/dL (ref 1.5–4.5)
Glucose: 59 mg/dL — ABNORMAL LOW (ref 65–99)
Potassium: 4.4 mmol/L (ref 3.5–5.2)
Sodium: 141 mmol/L (ref 134–144)
Total Protein: 7.5 g/dL (ref 6.0–8.5)

## 2020-03-03 LAB — TSH: TSH: 1.56 u[IU]/mL (ref 0.450–4.500)

## 2020-03-11 ENCOUNTER — Other Ambulatory Visit: Payer: Self-pay | Admitting: Family

## 2020-03-11 DIAGNOSIS — E1065 Type 1 diabetes mellitus with hyperglycemia: Secondary | ICD-10-CM

## 2020-03-25 ENCOUNTER — Other Ambulatory Visit: Payer: Self-pay | Admitting: *Deleted

## 2020-03-25 DIAGNOSIS — E1065 Type 1 diabetes mellitus with hyperglycemia: Secondary | ICD-10-CM

## 2020-03-25 MED ORDER — ATORVASTATIN CALCIUM 20 MG PO TABS: 20.0000 mg | ORAL_TABLET | Freq: Every day | ORAL | 0 refills | Status: DC

## 2020-03-27 ENCOUNTER — Other Ambulatory Visit: Payer: Self-pay | Admitting: Family

## 2020-03-27 DIAGNOSIS — K2100 Gastro-esophageal reflux disease with esophagitis, without bleeding: Secondary | ICD-10-CM

## 2020-04-06 ENCOUNTER — Other Ambulatory Visit: Payer: Self-pay | Admitting: Family

## 2020-04-06 ENCOUNTER — Telehealth: Payer: Self-pay

## 2020-04-06 DIAGNOSIS — E1065 Type 1 diabetes mellitus with hyperglycemia: Secondary | ICD-10-CM

## 2020-04-08 ENCOUNTER — Ambulatory Visit: Payer: Medicare Other | Attending: Internal Medicine

## 2020-04-08 DIAGNOSIS — Z23 Encounter for immunization: Secondary | ICD-10-CM

## 2020-04-08 MED ORDER — BREO ELLIPTA 100-25 MCG/INH IN AEPB: 1.0000 | INHALATION_SPRAY | Freq: Every day | RESPIRATORY_TRACT | 2 refills | Status: DC

## 2020-04-08 NOTE — Telephone Encounter (Signed)
Anoro changed to [employer]. Please schedule her appointment if she needs steroids.

## 2020-04-08 NOTE — Progress Notes (Signed)
   Covid-19 Vaccination Clinic  Name:  DALINDA HEIDT    MRN: [id number] DOB: Apr 12, 1951  04/08/2020  Ms. Margerum was observed post Covid-19 immunization for 15 minutes without incident. She was provided with Vaccine Information Sheet and instruction to access the V-Safe system.   Ms. Dice was instructed to call 911 with any severe reactions post vaccine: [name] Difficulty breathing  . Swelling of face and throat  . A fast heartbeat  . A bad rash all over body  . Dizziness and weakness

## 2020-04-08 NOTE — Telephone Encounter (Signed)
RETURNED CALL, NO ANSWER, LEFT MESSAGE TO CALL BACK 

## 2020-04-09 NOTE — Telephone Encounter (Signed)
Patient aware and verbalizes understanding. 

## 2020-04-11 NOTE — Progress Notes (Signed)
Cardiology Office Note  Date: 04/12/2020   ID: [name], DOB 29-Oct-1950, MRN [id number]  PCP:  Sharion Balloon, FNP  Cardiologist:  Kate Sable, MD (Inactive) Electrophysiologist:  None   No chief complaint on file.   History of Present Illness: [name] is a 69 y.o. female last visit June 14, 2018 for follow-up for history of valvular heart disease, hypertension, palpitations, hyperlipidemia, last echocardiogram showed LVEF of 60 to 61% grade 2 diastolic dysfunction with increased LV filling pressures, moderate mitral regurgitation and mild to moderate tricuspid regurgitation.  Had a hospitalization for DKA late July early August at [facility].  Recently had a colonoscopy with a polypectomy and has a history of diverticulosis.  Denied any recent palpitations, arrhythmias, fluttering sensations.  Recently saw PCP in November.  Lab work reviewed with patient from November.  Patient stated she had been having problems adjusting her thyroid medication adequately at PCP office.  Her hemoglobin A1c has improved and is now down to 7.4% from a previous 8.2%.  Medications were reviewed with patient   Systolic blood pressure slightly elevated.  Patient stated at home her blood pressures run anywhere from 443-154M systolic normally.  Patient stated she was attempting to eat better but her appetite has not been very good recently.  She was trying to avoid simple carbohydrates and concentrated sweets.  She did complain of some chest discomfort when lying down in bed.  She has a history of hiatal hernia and gastroesophageal reflux disease.  She stated she was having to sleep on at least 2 pillows to avoid significant reflux.  She was requesting changing from pantoprazole back to previous omeprazole with magnesium which she used to take.  Last seen by me 06/24/2019.  At that time she was having some chest discomfort.  She states today she has been having some increasing  shortness of breath on exertion with some palpitations usually experienced during the nighttime along with some chest discomfort without radiation to neck, arm, back, jaw.  No associated nausea, vomiting, or diaphoresis.  Also states she has been feeling some significant fatigue and feels like she has gained some weight.  Her weight back in May was 184.  Today's weight is 191.  She denies any lower extremity edema.  States she is taking her fluid pills as directed.  She has been having trouble getting her thyroid medication adjusted with fluctuations in her thyroid levels.  Past Medical History:  Diagnosis Date  . Allergic rhinitis   . Allergy   . Anemia   . Ankle swelling   . Anxiety    takes Xanax daily   . Arthritis   . Asthma   . Blurred vision, bilateral   . Chronic kidney disease   . Depression    takes Celexa daily  . Diabetes mellitus without complication ([place])    type II; takes Actos daily  . GERD (gastroesophageal reflux disease)   . Gout   . Headache   . Heart murmur   . Heart palpitations   . Hemorrhoids   . History of blood transfusion    no abnormal reaction noted  . History of bronchitis   . Hyperlipidemia   . Hypertension    takes Catapres and Quinapril daily  . Hypothyroidism    takes Synthroid daily  . Moderate mitral insufficiency    a. 12/2014 Echo: EF 55-60%, mod MR, mildly to moderately dil LA, PASP 26mmHg.  . Myocardial infarction ([place])    "  Minor"  . Osteoarthritis   . Paroxysmal supraventricular tachycardia ([place])   . Recurrent ventral incisional hernia   . Ringing in ears   . Shortness of breath dyspnea    ALbuterol daily as needed  . Stevens-Johnson disease ([place])   . Urinary incontinence   . Wears glasses     Past Surgical History:  Procedure Laterality Date  . ABDOMINAL HYSTERECTOMY    . BOWEL RESECTION    . COLON SURGERY    . COLONOSCOPY    . COLONOSCOPY WITH PROPOFOL N/A 04/29/2019   Procedure: COLONOSCOPY WITH PROPOFOL;  Surgeon:  Danie Binder, MD;  [facility];  Service: Endoscopy;  Laterality: N/A;  7:30am  . ESOPHAGOGASTRODUODENOSCOPY    . [place]  . HERNIA REPAIR     hiatal hernia repair  . [place]   pt is unsure of exact date but thinks it was early 59's  . INCISIONAL HERNIA REPAIR N/A 04/02/2018   Procedure: OPEN VENTRAL HERNIA REPAIR WITH MESH, Explantation of hernia mesh;  Surgeon: Donnie Mesa, MD;  Location: [place];  Service: General;  Laterality: N/A;  . INSERTION OF MESH N/A 04/02/2018   Procedure: INSERTION OF MESH;  Surgeon: Donnie Mesa, MD;  Location: [place];  Service: General;  Laterality: N/A;  . POLYPECTOMY  04/29/2019   Procedure: POLYPECTOMY;  Surgeon: Danie Binder, MD;  [facility];  Service: Endoscopy;;  . TOTAL HIP ARTHROPLASTY Left 02/02/2015   Procedure: LEFT TOTAL HIP ARTHROPLASTY;  Surgeon: Garald Balding, MD;  Location: [place];  Service: Orthopedics;  Laterality: Left;  . TOTAL HIP ARTHROPLASTY Right 02/01/2016   Procedure: TOTAL HIP ARTHROPLASTY;  Surgeon: Garald Balding, MD;  Location: [place];  Service: Orthopedics;  Laterality: Right;    Current Outpatient Medications  Medication Sig Dispense Refill  . acetaminophen (TYLENOL) 500 MG tablet Take 1,000 mg by mouth every 8 (eight) hours as needed (pain).     [name] atorvastatin (LIPITOR) 20 MG tablet Take 1 tablet (20 mg total) by mouth daily. 90 tablet 0  . busPIRone (BUSPAR) 5 MG tablet TAKE 1-2 TABLETS (5-10 MG TOTAL) BY MOUTH 2 (TWO) TIMES DAILY. 360 tablet 0  . cetirizine (ZYRTEC) 10 MG tablet Take 20 mg by mouth daily.    . cloNIDine (CATAPRES) 0.1 MG tablet Take 1 tablet (0.1 mg total) by mouth daily. 90 tablet 1  . docusate sodium (COLACE) 100 MG capsule Take 100 mg by mouth 2 (two) times daily.    . empagliflozin (JARDIANCE) 25 MG TABS tablet Take 1 tablet (25 mg total) by mouth daily before breakfast. 90 tablet 2  . esomeprazole (NEXIUM) 40 MG capsule TAKE 1  CAPSULE BY MOUTH EVERY DAY 90 capsule 0  . fluticasone (FLONASE) 50 MCG/ACT nasal spray Place 1 spray into both nostrils 2 (two) times daily as needed for allergies or rhinitis.    . fluticasone furoate-vilanterol (BREO ELLIPTA) 100-25 MCG/INH AEPB Inhale 1 puff into the lungs daily. 60 each 2  . furosemide (LASIX) 40 MG tablet Take 20 mg by mouth daily.    [name] glucose blood (ONE TOUCH ULTRA TEST) test strip TEST TWICE A DAY 100 each 12  . HUMALOG 100 UNIT/ML injection INJECT 0.04-0.1 MLS (4-10 UNITS TOTAL) INTO THE SKIN 3 (THREE) TIMES DAILY WITH MEALS. SLIDING SCALE 10 mL 0  . hydrOXYzine (ATARAX/VISTARIL) 25 MG tablet Take 1 tablet (25 mg total) by mouth 3 (three) times daily. 270 tablet 1  .  insulin glargine (LANTUS) 100 UNIT/ML injection INJECT 30-40 INTO THE SKIN DAILY. 10 mL 11  . Insulin Syringe-Needle U-100 (BD INSULIN SYRINGE U/F) 31G X 5/16" 0.5 ML MISC USE TO INJECT LANTUS AND NOVOLOG DAILY 100 each 11  . Iron-Vitamin C (VITRON-C) 65-125 MG TABS Take 1 tablet by mouth daily.    [name] levothyroxine (SYNTHROID) 75 MCG tablet Take 1 tablet (75 mcg total) by mouth daily. 90 tablet 1  . Magnesium 400 MG TABS Take 400 mg by mouth daily.    . magnesium hydroxide (PHILLIPS MILK OF MAGNESIA) 400 MG/5ML suspension Take 60 mLs by mouth See admin instructions. Every 3 days - [name]    . Menthol, Topical Analgesic, (BIOFREEZE COLORLESS) 4 % GEL Apply 1 application topically daily as needed (pain). Foot, Legs and Knees    . metFORMIN (GLUCOPHAGE) 500 MG tablet TAKE 1 TABLET (500 MG TOTAL) BY MOUTH 2 (TWO) TIMES DAILY WITH A MEAL. 180 tablet 0  . metoprolol succinate (TOPROL-XL) 25 MG 24 hr tablet Take 1 tablet (25 mg total) by mouth daily. 90 tablet 3  . montelukast (SINGULAIR) 10 MG tablet TAKE 1 TABLET BY MOUTH EVERY EVENING 90 tablet 1  . Multiple Vitamins-Minerals (MULTIVITAMIN WITH MINERALS) tablet Take 1 tablet by mouth daily. Centrum Silver    . PROAIR HFA 108 (90 Base) MCG/ACT inhaler TAKE 2  PUFFS BY MOUTH EVERY 6 HOURS AS NEEDED FOR WHEEZE OR SHORTNESS OF BREATH 8.5 g 0  . Propylene Glycol (SYSTANE BALANCE OP) Place 1 drop into both eyes daily as needed (Dry eyes).    . quinapril (ACCUPRIL) 5 MG tablet TAKE 2 TABLETS BY MOUTH EVERY DAY 180 tablet 1  . Vitamin D, Ergocalciferol, (DRISDOL) 1.25 MG (50000 UNIT) CAPS capsule TAKE 1 CAPSULE BY MOUTH  EACH WEEK 12 capsule 3   No current facility-administered medications for this visit.   Allergies:  Other, Aspirin, Allopurinol, Penicillins, and Sulfa antibiotics   Social History: The patient  reports that she has never smoked. She has never used smokeless tobacco. She reports that she does not drink alcohol and does not use drugs.   Family History: The patient's family history includes COPD in her father; Cancer in her father; Colon cancer in her maternal aunt and paternal aunt; Colon polyps in her mother; Diabetes in her mother; Heart attack in her mother; Hyperlipidemia in her sister; Hypertension in her mother and sister; Kidney disease in her mother.   ROS:  Please see the history of present illness. Otherwise, complete review of systems is positive for none.  All other systems are reviewed and negative.   Physical Exam: VS:  BP 114/68   Pulse 93   Ht 5\' 4"  (1.626 m)   Wt 191 lb 6.4 oz (86.8 kg)   SpO2 97%   BMI 32.85 kg/m , BMI Body mass index is 32.85 kg/m.  Wt Readings from Last 3 Encounters:  04/12/20 191 lb 6.4 oz (86.8 kg)  03/02/20 187 lb 12.8 oz (85.2 kg)  10/27/19 184 lb 3.2 oz (83.6 kg)    General: Patient appears comfortable at rest. Neck: Supple, no elevated JVP or carotid bruits, no thyromegaly. Lungs: Clear to auscultation, nonlabored breathing at rest. Cardiac: Regular rate and rhythm, no S3 or significant systolic murmur, no pericardial rub. Extremities: No pitting edema, distal pulses 2+. Skin: Warm and dry. Musculoskeletal: No kyphosis. Neuropsychiatric: Alert and oriented x3, affect grossly  appropriate.  ECG:  An ECG dated June 24, 2019 was personally reviewed today and demonstrated:  Sinus rhythm rate of 79, nonspecific T wave abnormality  Recent Labwork: 03/02/2020: ALT 12; AST 15; BUN 24; Creatinine, Ser 1.34; Hemoglobin 13.9; Platelets 295; Potassium 4.4; Sodium 141; TSH 1.560     Component Value Date/Time   CHOL 136 08/06/2019 1144   TRIG 99 08/06/2019 1144   HDL 36 (L) 08/06/2019 1144   CHOLHDL 3.8 08/06/2019 1144   [place] 81 08/06/2019 1144    Other Studies Reviewed Today:  Echocardiogram January 26, 2016 Study Conclusions  - Left ventricle: The cavity size was normal. Wall thickness was   normal. Systolic function was normal. The estimated ejection   fraction was in the range of 60% to 65%. Wall motion was normal;   there were no regional wall motion abnormalities. Features are   consistent with a pseudonormal left ventricular filling pattern,   with concomitant abnormal relaxation and increased filling   pressure (grade 2 diastolic dysfunction). - Mitral valve: Mild thickening and calcification. There was   moderate regurgitation. - Left atrium: The atrium was mildly dilated. - Right atrium: Central venous pressure (est): 3 mm Hg. - Tricuspid valve: There was mild-moderate regurgitation. - Pulmonary arteries: PA peak pressure: 38 mm Hg (S). - Pericardium, extracardiac: There was no pericardial effusion.  Impressions:  - Normal LV wall thickness with LVEF 60-65%. Grade 2 diastolic   dysfunction with increased LV filling pressure. Mild left atrial   enlargement. Mildly thickened and calcified mitral valve with   moderate mitral regurgitation. Mild to moderate tricuspid   regurgitation with PASP 38 mmHg.   Assessment and Plan:   1.  Chest discomfort/DOE Complaining of increased dyspnea on exertion over the last few months.  Also complaining of some intermittent chest discomfort without radiation to neck, arm, back or jaw.  Denies any associated  nausea or vomiting but does complain of some diaphoresis.  Please get a Lexiscan Myoview stress   2. Palpitations Patient states she has been having some increased episodes of palpitations, skipped beats.  Denies any syncopal or near syncopal episodes.  3. Essential hypertension Blood pressure well controlled on current medications.  Continue quinapril 10 mg daily, clonidine 0.1 mg, Lasix 40 mg daily.  4. Valvular heart disease Mild to moderate TR and moderate MR.  Echo August 2017.  Patient's been complaining of some increasing shortness of breath recently.  Please repeat echocardiogram to reevaluate LV function, diastolic function and valvular function.  5. Mixed hyperlipidemia Recent LDL 89.  Continue Lipitor 20 mg daily.  6. Gastroesophageal reflux disease without esophagitis She is continuing omeprazole with magnesium 20 mg 2 pills daily.  Medication Adjustments/Labs and Tests Ordered: Current medicines are reviewed at length with the patient today.  Concerns regarding medicines are outlined above.    Patient Instructions  Medication Instructions:  Continue all current medications.  Labwork: none  Testing/Procedures:  Your physician has requested that you have an echocardiogram. Echocardiography is a painless test that uses sound waves to create images of your heart. It provides your doctor with information about the size and shape of your heart and how well your heart's chambers and valves are working. This procedure takes approximately one hour. There are no restrictions for this procedure.  Your physician has requested that you have a lexiscan myoview. For further information please visit HugeFiesta.[email or web address]. Please follow instruction sheet, as given.  Office will contact with results via phone or letter.    Follow-Up: 6-8 weeks   Any Other Special Instructions Will Be Listed Below (If Applicable).  If  you need a refill on your cardiac medications before your next  appointment, please call your pharmacy.       Follow-up 6 to 8 weeks Dr. Harl Bowie or APP   Signed, Levell July, NP 04/12/2020 2:21 PM    [facility], [place], [place] [address] Phone: [phone]; Fax: [phone]

## 2020-04-12 ENCOUNTER — Encounter: Payer: Self-pay | Admitting: Family Medicine

## 2020-04-12 ENCOUNTER — Encounter: Payer: Self-pay | Admitting: *Deleted

## 2020-04-12 ENCOUNTER — Ambulatory Visit: Payer: Medicare Other | Admitting: Family Medicine

## 2020-04-12 VITALS — BP 114/68 | HR 93 | Ht 64.0 in | Wt 191.4 lb

## 2020-04-12 DIAGNOSIS — R0602 Shortness of breath: Secondary | ICD-10-CM | POA: Diagnosis not present

## 2020-04-12 DIAGNOSIS — R079 Chest pain, unspecified: Secondary | ICD-10-CM

## 2020-04-12 DIAGNOSIS — R002 Palpitations: Secondary | ICD-10-CM

## 2020-04-12 DIAGNOSIS — K219 Gastro-esophageal reflux disease without esophagitis: Secondary | ICD-10-CM

## 2020-04-12 DIAGNOSIS — I10 Essential (primary) hypertension: Secondary | ICD-10-CM | POA: Diagnosis not present

## 2020-04-12 DIAGNOSIS — I340 Nonrheumatic mitral (valve) insufficiency: Secondary | ICD-10-CM

## 2020-04-12 DIAGNOSIS — E782 Mixed hyperlipidemia: Secondary | ICD-10-CM

## 2020-04-12 NOTE — Patient Instructions (Addendum)
Medication Instructions:  Continue all current medications.  Labwork: none  Testing/Procedures: Your physician has requested that you have an echocardiogram. Echocardiography is a painless test that uses sound waves to create images of your heart. It provides your doctor with information about the size and shape of your heart and how well your heart's chambers and valves are working. This procedure takes approximately one hour. There are no restrictions for this procedure. Your physician has requested that you have a lexiscan myoview. For further information please visit www.cardiosmart.org. Please follow instruction sheet, as given. Office will contact with results via phone or letter.    Follow-Up: 6-8 weeks   Any Other Special Instructions Will Be Listed Below (If Applicable).  If you need a refill on your cardiac medications before your next appointment, please call your pharmacy.  

## 2020-04-15 ENCOUNTER — Other Ambulatory Visit: Payer: Self-pay

## 2020-04-15 MED ORDER — LEVOTHYROXINE SODIUM 75 MCG PO TABS: 75.0000 ug | ORAL_TABLET | Freq: Every day | ORAL | 3 refills | Status: DC

## 2020-04-19 ENCOUNTER — Telehealth: Payer: Self-pay | Admitting: Family Medicine

## 2020-04-19 NOTE — Telephone Encounter (Signed)
Patient informed to hold lasix in addition to all diabetic medications on the morning of her test. Verbalized understanding.

## 2020-04-19 NOTE — Telephone Encounter (Signed)
Patient called in regards to her upcoming stress test. Needs to know about taking her BP medication.

## 2020-04-21 ENCOUNTER — Encounter (HOSPITAL_COMMUNITY)
Admission: RE | Admit: 2020-04-21 | Discharge: 2020-04-21 | Disposition: A | Discharge status: Home / Self Care | Payer: Medicare Other | Source: Ambulatory Visit | Attending: Family Medicine | Admitting: Family Medicine

## 2020-04-21 ENCOUNTER — Other Ambulatory Visit: Payer: Self-pay

## 2020-04-21 ENCOUNTER — Encounter (HOSPITAL_BASED_OUTPATIENT_CLINIC_OR_DEPARTMENT_OTHER)
Admission: RE | Admit: 2020-04-21 | Discharge: 2020-04-21 | Disposition: A | Discharge status: Home / Self Care | Payer: Medicare Other | Source: Ambulatory Visit | Attending: Family Medicine | Admitting: Family Medicine

## 2020-04-21 ENCOUNTER — Encounter (HOSPITAL_COMMUNITY): Payer: Self-pay

## 2020-04-21 DIAGNOSIS — R079 Chest pain, unspecified: Secondary | ICD-10-CM | POA: Insufficient documentation

## 2020-04-21 LAB — NM MYOCAR MULTI W/SPECT W/WALL MOTION / EF
LV dias vol: 45 mL (ref 46–106)
LV sys vol: 15 mL
Peak HR: 111 {beats}/min
RATE: 0.49
Rest HR: 78 {beats}/min
SDS: 0
SRS: 1
SSS: 1
TID: 1.01

## 2020-04-21 MED ORDER — TECHNETIUM TC 99M TETROFOSMIN IV KIT
10.0000 | PACK | Freq: Once | INTRAVENOUS | Status: AC | PRN
  Administered 2020-04-21: 10 via INTRAVENOUS

## 2020-04-21 MED ORDER — SODIUM CHLORIDE FLUSH 0.9 % IV SOLN
INTRAVENOUS | Status: AC
  Administered 2020-04-21: 10 mL via INTRAVENOUS
  Filled 2020-04-21: qty 10

## 2020-04-21 MED ORDER — TECHNETIUM TC 99M TETROFOSMIN IV KIT
30.0000 | PACK | Freq: Once | INTRAVENOUS | Status: AC | PRN
  Administered 2020-04-21: 32 via INTRAVENOUS

## 2020-04-21 MED ORDER — REGADENOSON 0.4 MG/5ML IV SOLN
INTRAVENOUS | Status: AC
  Administered 2020-04-21: 0.4 mg via INTRAVENOUS
  Filled 2020-04-21: qty 5

## 2020-04-22 ENCOUNTER — Telehealth: Payer: Self-pay | Admitting: *Deleted

## 2020-04-22 NOTE — Telephone Encounter (Signed)
Laurine Blazer, LPN  59/96/[id number] 0:22 PM EST Back to Top    Notified, copy to pcp.

## 2020-04-22 NOTE — Telephone Encounter (Signed)
-----   Message from Verta Ellen., NP sent at 04/21/2020 10:17 PM EST ----- Please let the patient know the stress did not show evidence decrease blood flow in the coronary arteries. It was considered a low risk test. Pumping function looked good

## 2020-04-27 ENCOUNTER — Ambulatory Visit (INDEPENDENT_AMBULATORY_CARE_PROVIDER_SITE_OTHER): Payer: Medicare Other

## 2020-04-27 DIAGNOSIS — R0602 Shortness of breath: Secondary | ICD-10-CM | POA: Diagnosis not present

## 2020-04-27 LAB — ECHOCARDIOGRAM COMPLETE
Area-P 1/2: 3.46 cm2
Calc EF: 57.5 %
MV M vel: 2.55 m/s
MV Peak grad: 26.1 mmHg
S' Lateral: 2.74 cm
Single Plane A2C EF: 58.7 %
Single Plane A4C EF: 58.2 %

## 2020-04-29 ENCOUNTER — Telehealth: Payer: Self-pay | Admitting: *Deleted

## 2020-04-29 NOTE — Telephone Encounter (Signed)
Laurine Blazer, LPN  22/97/9892 1:19 PM EST Back to Top    Notified, copy to pcp.

## 2020-04-29 NOTE — Telephone Encounter (Signed)
-----   Message from Verta Ellen., NP sent at 04/28/2020  9:50 PM EST ----- Echo showed good pumping function of the heart. Mildly leaking mitral valve. This has actually improved since last echo in 2017. No evidence of structural problems of the heart to explain shortness of breath.

## 2020-04-30 ENCOUNTER — Other Ambulatory Visit: Payer: Self-pay | Admitting: Family

## 2020-04-30 DIAGNOSIS — E1065 Type 1 diabetes mellitus with hyperglycemia: Secondary | ICD-10-CM

## 2020-05-01 ENCOUNTER — Other Ambulatory Visit: Payer: Self-pay | Admitting: Family

## 2020-05-04 ENCOUNTER — Telehealth: Payer: Self-pay

## 2020-05-04 NOTE — Telephone Encounter (Signed)
Pt called stating that she is currently taking Breo and Albuterol that Christy prescribed for her to take but wants to know if Alyse Low can increase the mg on the Breo and ask if she thinks she should increase the Albuterol too because she still gets short of breath.  Please advise and call pt @ [phone]

## 2020-05-05 NOTE — Telephone Encounter (Signed)
Covering pcp please advise. 

## 2020-05-05 NOTE — Telephone Encounter (Signed)
Defer to PCP

## 2020-05-06 ENCOUNTER — Other Ambulatory Visit: Payer: Self-pay | Admitting: Family

## 2020-05-06 DIAGNOSIS — I10 Essential (primary) hypertension: Secondary | ICD-10-CM

## 2020-05-10 MED ORDER — BREO ELLIPTA 200-25 MCG/INH IN AEPB: 1.0000 | INHALATION_SPRAY | Freq: Every day | RESPIRATORY_TRACT | 0 refills | Status: DC

## 2020-05-10 NOTE — Telephone Encounter (Signed)
lmtcb

## 2020-05-10 NOTE — Telephone Encounter (Signed)
Breo increased to 200-25 mcg from 100-25 mcg. Please schedule her a follow up visit. We may need to change her inhaler completely.

## 2020-05-12 NOTE — Telephone Encounter (Signed)
Multiple attempts made to contact patient.  This encounter will now be closed  

## 2020-05-13 ENCOUNTER — Telehealth: Payer: Self-pay | Admitting: Family

## 2020-05-13 NOTE — Telephone Encounter (Signed)
Pt wanted to let us know she had gotten the flu shot Sep 7 @ [facility] and [facility]

## 2020-05-13 NOTE — Telephone Encounter (Signed)
Both COVID Booster and flu vaccine has been documented in patient's chart

## 2020-05-19 ENCOUNTER — Other Ambulatory Visit: Payer: Self-pay | Admitting: Family

## 2020-05-19 DIAGNOSIS — E1065 Type 1 diabetes mellitus with hyperglycemia: Secondary | ICD-10-CM

## 2020-05-23 NOTE — Progress Notes (Signed)
Cardiology Office Note  Date: 05/24/2020   ID: [name], DOB Nov 24, 1950, MRN [id number]  PCP:  Sharion Balloon, FNP  Cardiologist:  No primary care provider on file. Electrophysiologist:  None   Chief Complaint  Patient presents with  . Follow-up    Recent stress test and echocardiogram, hypertension, DOE, HLD,    History of Present Illness: [name] is a 69 y.o. female last visit June 14, 2018 for follow-up for history of valvular heart disease, hypertension, palpitations, hyperlipidemia, last echocardiogram showed LVEF of 60 to 79% grade 2 diastolic dysfunction with increased LV filling pressures, moderate mitral regurgitation and mild to moderate tricuspid regurgitation.  Had a hospitalization for DKA late July early August 2021 at [facility].  Recently had a colonoscopy with a polypectomy and has a history of diverticulosis.  Denied any recent palpitations, arrhythmias, fluttering sensations.  Recently saw PCP in November.  Lab work reviewed with patient from November.  Patient stated she had been having problems adjusting her thyroid medication adequately at PCP office.  Her hemoglobin A1c has improved and is now down to 7.4% from a previous 8.2%.  Medications were reviewed with patient   Systolic blood pressure slightly elevated.  Patient stated at home her blood pressures run anywhere from 480-165V systolic normally.  Patient stated she was attempting to eat better but her appetite has not been very good recently.  She was trying to avoid simple carbohydrates and concentrated sweets.  She did complain of some chest discomfort when lying down in bed.  She has a history of hiatal hernia and gastroesophageal reflux disease.  She stated she was having to sleep on at least 2 pillows to avoid significant reflux.  She was requesting changing from pantoprazole back to previous omeprazole with magnesium which she used to take.  Last seen by me 06/24/2019.  At that  time she was having some chest discomfort.  At last visit she stated she had been having some increasing shortness of breath on exertion with some palpitations usually experienced during the nighttime along with some chest discomfort without radiation to neck, arm, back, jaw.  No associated nausea, vomiting, or diaphoresis.  Also stated she had been feeling some significant fatigue and felt like she had gained some weight.  Her weight back in May was 184.  Weight at last visit was 191.  She denied any lower extremity edema.  She was taking her fluid pills as directed.  She had been having trouble getting her thyroid medication adjusted with fluctuations in her thyroid levels.   She is here for follow-up status post recent echocardiogram and stress test due to previous complaints of chest discomfort and shortness of breath at last visit with me on 04/19/2020.  States he still continues to have some mild dyspnea on exertion.  States she was born in a household where there was significant smoking although she did not smoke.  Also long history of working in a [employer] exposed to dust.  She has had bilateral hip replacement limiting her ability to exercise.  States she plans to purchase a treadmill to begin some type of mild exercise program.  Past Medical History:  Diagnosis Date  . Allergic rhinitis   . Allergy   . Anemia   . Ankle swelling   . Anxiety    takes Xanax daily   . Arthritis   . Asthma   . Blurred vision, bilateral   . Chronic kidney disease   .  Depression    takes Celexa daily  . Diabetes mellitus without complication ([place])    type II; takes Actos daily  . GERD (gastroesophageal reflux disease)   . Gout   . Headache   . Heart murmur   . Heart palpitations   . Hemorrhoids   . History of blood transfusion    no abnormal reaction noted  . History of bronchitis   . Hyperlipidemia   . Hypertension    takes Catapres and Quinapril daily  . Hypothyroidism    takes Synthroid  daily  . Moderate mitral insufficiency    a. 12/2014 Echo: EF 55-60%, mod MR, mildly to moderately dil LA, PASP 80mmHg.  . Myocardial infarction ([place])    " Minor"  . Osteoarthritis   . Paroxysmal supraventricular tachycardia ([place])   . Recurrent ventral incisional hernia   . Ringing in ears   . Shortness of breath dyspnea    ALbuterol daily as needed  . Stevens-Johnson disease ([place])   . Urinary incontinence   . Wears glasses     Past Surgical History:  Procedure Laterality Date  . ABDOMINAL HYSTERECTOMY    . BOWEL RESECTION    . COLON SURGERY    . COLONOSCOPY    . COLONOSCOPY WITH PROPOFOL N/A 04/29/2019   Procedure: COLONOSCOPY WITH PROPOFOL;  Surgeon: Danie Binder, MD;  [facility];  Service: Endoscopy;  Laterality: N/A;  7:30am  . ESOPHAGOGASTRODUODENOSCOPY    . [place]  . HERNIA REPAIR     hiatal hernia repair  . [place]   pt is unsure of exact date but thinks it was early 82's  . INCISIONAL HERNIA REPAIR N/A 04/02/2018   Procedure: OPEN VENTRAL HERNIA REPAIR WITH MESH, Explantation of hernia mesh;  Surgeon: Donnie Mesa, MD;  Location: [place];  Service: General;  Laterality: N/A;  . INSERTION OF MESH N/A 04/02/2018   Procedure: INSERTION OF MESH;  Surgeon: Donnie Mesa, MD;  Location: [place];  Service: General;  Laterality: N/A;  . POLYPECTOMY  04/29/2019   Procedure: POLYPECTOMY;  Surgeon: Danie Binder, MD;  [facility];  Service: Endoscopy;;  . TOTAL HIP ARTHROPLASTY Left 02/02/2015   Procedure: LEFT TOTAL HIP ARTHROPLASTY;  Surgeon: Garald Balding, MD;  Location: [place];  Service: Orthopedics;  Laterality: Left;  . TOTAL HIP ARTHROPLASTY Right 02/01/2016   Procedure: TOTAL HIP ARTHROPLASTY;  Surgeon: Garald Balding, MD;  Location: [place];  Service: Orthopedics;  Laterality: Right;    Current Outpatient Medications  Medication Sig Dispense Refill  . acetaminophen (TYLENOL) 500 MG tablet Take 1,000  mg by mouth every 8 (eight) hours as needed (pain).     [name] albuterol (VENTOLIN HFA) 108 (90 Base) MCG/ACT inhaler TAKE 2 PUFFS BY MOUTH EVERY 6 HOURS AS NEEDED FOR WHEEZE OR SHORTNESS OF BREATH 8.5 each 0  . atorvastatin (LIPITOR) 20 MG tablet Take 1 tablet (20 mg total) by mouth daily. 90 tablet 0  . busPIRone (BUSPAR) 5 MG tablet TAKE 1-2 TABLETS (5-10 MG TOTAL) BY MOUTH 2 (TWO) TIMES DAILY. 360 tablet 0  . cetirizine (ZYRTEC) 10 MG tablet Take 20 mg by mouth daily.    . cloNIDine (CATAPRES) 0.1 MG tablet TAKE 1 TABLET (0.1 MG TOTAL) BY MOUTH DAILY. 90 tablet 0  . docusate sodium (COLACE) 100 MG capsule Take 100 mg by mouth 2 (two) times daily.    . empagliflozin (JARDIANCE) 25 MG TABS tablet Take 1  tablet (25 mg total) by mouth daily before breakfast. 90 tablet 2  . esomeprazole (NEXIUM) 40 MG capsule TAKE 1 CAPSULE BY MOUTH EVERY DAY 90 capsule 0  . fluticasone (FLONASE) 50 MCG/ACT nasal spray Place 1 spray into both nostrils 2 (two) times daily as needed for allergies or rhinitis.    . fluticasone furoate-vilanterol (BREO ELLIPTA) 200-25 MCG/INH AEPB Inhale 1 puff into the lungs daily. 3 each 0  . furosemide (LASIX) 40 MG tablet Take 20 mg by mouth daily.    [name] glucose blood (ONE TOUCH ULTRA TEST) test strip TEST TWICE A DAY 100 each 12  . HUMALOG 100 UNIT/ML injection INJECT 0.04-0.1 MLS (4-10 UNITS TOTAL) INTO THE SKIN 3 (THREE) TIMES DAILY WITH MEALS. SLIDING SCALE 10 mL 0  . hydrOXYzine (ATARAX/VISTARIL) 25 MG tablet Take 1 tablet (25 mg total) by mouth 3 (three) times daily. 270 tablet 1  . insulin glargine (LANTUS) 100 UNIT/ML injection INJECT 30-40 INTO THE SKIN DAILY. 10 mL 11  . Insulin Syringe-Needle U-100 (BD INSULIN SYRINGE U/F) 31G X 5/16" 0.5 ML MISC USE TO INJECT LANTUS AND NOVOLOG DAILY 100 each 11  . Iron-Vitamin C (VITRON-C) 65-125 MG TABS Take 1 tablet by mouth daily.    [name] levothyroxine (SYNTHROID) 75 MCG tablet Take 1 tablet (75 mcg total) by mouth daily. 90 tablet 3  .  Magnesium 400 MG TABS Take 400 mg by mouth daily.    . magnesium hydroxide (MILK OF MAGNESIA) 400 MG/5ML suspension Take 60 mLs by mouth See admin instructions. Every 3 days - [name]    . Menthol, Topical Analgesic, (BIOFREEZE COLORLESS) 4 % GEL Apply 1 application topically daily as needed (pain). Foot, Legs and Knees    . metFORMIN (GLUCOPHAGE) 500 MG tablet TAKE 1 TABLET (500 MG TOTAL) BY MOUTH 2 (TWO) TIMES DAILY WITH A MEAL. 180 tablet 0  . metoprolol succinate (TOPROL-XL) 25 MG 24 hr tablet Take 1 tablet (25 mg total) by mouth daily. 90 tablet 3  . montelukast (SINGULAIR) 10 MG tablet TAKE 1 TABLET BY MOUTH EVERY EVENING 90 tablet 1  . Multiple Vitamins-Minerals (MULTIVITAMIN WITH MINERALS) tablet Take 1 tablet by mouth daily. Centrum Silver    . Propylene Glycol (SYSTANE BALANCE OP) Place 1 drop into both eyes daily as needed (Dry eyes).    . quinapril (ACCUPRIL) 5 MG tablet TAKE 2 TABLETS BY MOUTH EVERY DAY 180 tablet 1  . Vitamin D, Ergocalciferol, (DRISDOL) 1.25 MG (50000 UNIT) CAPS capsule TAKE 1 CAPSULE BY MOUTH  EACH WEEK 12 capsule 3   No current facility-administered medications for this visit.   Allergies:  Other, Aspirin, Allopurinol, Penicillins, and Sulfa antibiotics   Social History: The patient  reports that she has never smoked. She has never used smokeless tobacco. She reports that she does not drink alcohol and does not use drugs.   Family History: The patient's family history includes COPD in her father; Cancer in her father; Colon cancer in her maternal aunt and paternal aunt; Colon polyps in her mother; Diabetes in her mother; Heart attack in her mother; Hyperlipidemia in her sister; Hypertension in her mother and sister; Kidney disease in her mother.   ROS:  Please see the history of present illness. Otherwise, complete review of systems is positive for none.  All other systems are reviewed and negative.   Physical Exam: VS:  BP 126/60   Pulse 85   Resp 16   Ht  5\' 4"  (1.626 m)   Wt 189  lb 9.6 oz (86 kg)   SpO2 99%   BMI 32.54 kg/m , BMI Body mass index is 32.54 kg/m.  Wt Readings from Last 3 Encounters:  05/24/20 189 lb 9.6 oz (86 kg)  04/12/20 191 lb 6.4 oz (86.8 kg)  03/02/20 187 lb 12.8 oz (85.2 kg)    General: Patient appears comfortable at rest. Neck: Supple, no elevated JVP or carotid bruits, no thyromegaly. Lungs: Clear to auscultation, nonlabored breathing at rest. Cardiac: Regular rate and rhythm, no S3 or significant systolic murmur, no pericardial rub. Extremities: No pitting edema, distal pulses 2+. Skin: Warm and dry. Musculoskeletal: No kyphosis. Neuropsychiatric: Alert and oriented x3, affect grossly appropriate.  ECG:  An ECG dated June 24, 2019 was personally reviewed today and demonstrated:  Sinus rhythm rate of 79, nonspecific T wave abnormality  Recent Labwork: 03/02/2020: ALT 12; AST 15; BUN 24; Creatinine, Ser 1.34; Hemoglobin 13.9; Platelets 295; Potassium 4.4; Sodium 141; TSH 1.560     Component Value Date/Time   CHOL 136 08/06/2019 1144   TRIG 99 08/06/2019 1144   HDL 36 (L) 08/06/2019 1144   CHOLHDL 3.8 08/06/2019 1144   [address] 81 08/06/2019 1144    Other Studies Reviewed Today:  NST 04/21/2020  Narrative & Impression   There was no ST segment deviation noted during stress.  The study is normal. There are no perfusion defects  This is a low risk study.  The left ventricular ejection fraction is hyperdynamic (>65%).        Echocardiogram 04/27/2020 1. Left ventricular ejection fraction, by estimation, is 60 to 65%. The left ventricle has normal function. Left ventricular endocardial border not optimally defined to evaluate regional wall motion. Left ventricular diastolic parameters are indeterminate. 2. Right ventricular systolic function is normal. The right ventricular size is normal. There is normal pulmonary artery systolic pressure. The estimated right ventricular systolic pressure  is 29.7 mmHg. 3. The mitral valve is abnormal. Mild mitral valve regurgitation. 4. The aortic valve is tricuspid. Aortic valve regurgitation is not visualized. Mild aortic valve sclerosis is present, with no evidence of aortic valve stenosis. 5. The inferior vena cava is normal in size with greater than 50% respiratory variability, suggesting right atrial pressure of 3 mmHg. Comparison(s): Echocardiogram done 01/26/16 showed an EF of 60-65%.   Echocardiogram January 26, 2016 Study Conclusions  - Left ventricle: The cavity size was normal. Wall thickness was   normal. Systolic function was normal. The estimated ejection   fraction was in the range of 60% to 65%. Wall motion was normal;   there were no regional wall motion abnormalities. Features are   consistent with a pseudonormal left ventricular filling pattern,   with concomitant abnormal relaxation and increased filling   pressure (grade 2 diastolic dysfunction). - Mitral valve: Mild thickening and calcification. There was   moderate regurgitation. - Left atrium: The atrium was mildly dilated. - Right atrium: Central venous pressure (est): 3 mm Hg. - Tricuspid valve: There was mild-moderate regurgitation. - Pulmonary arteries: PA peak pressure: 38 mm Hg (S). - Pericardium, extracardiac: There was no pericardial effusion.  Impressions:  - Normal LV wall thickness with LVEF 60-65%. Grade 2 diastolic   dysfunction with increased LV filling pressure. Mild left atrial   enlargement. Mildly thickened and calcified mitral valve with   moderate mitral regurgitation. Mild to moderate tricuspid   regurgitation with PASP 38 mmHg.   Assessment and Plan:   1.  Chest discomfort/DOE Last visit she complained of increased dyspnea  on exertion over the last prior few months.  Also complained of some intermittent chest discomfort without radiation to neck, arm, back or jaw.  Recent stress test was low risk and showed no evidence of  ischemia.  Echocardiogram demonstrated EF of 60 to 65% and mild mitral regurgitation.  Patient's had a hip surgery x2 1 surgery on the left in 2016 and surgery on the right in 2017.  States she has not been very active on a daily basis and has gained weight.  DOE is likely a combination of deconditioning, sedentary lifestyle.    2. Palpitations At last visit she complained of  some increased episodes of palpitations, skipped beats.  She denies any issues today on follow-up.  3. Essential hypertension Blood pressure well controlled on current medications. Continue quinapril 10 mg daily, clonidine 0.1 mg, Lasix 40 mg daily.  4. Valvular heart disease Mild to moderate TR and moderate MR on Echo August 2017.  Recent echocardiogram demonstrated EF 60 to 65% and mild mitral regurgitation on 04/27/2020.  5. Mixed hyperlipidemia Recent LDL 89.  Continue Lipitor 20 mg daily.  6. Gastroesophageal reflux disease without esophagitis She is continuing omeprazole with magnesium 20 mg 2 pills daily.  Medication Adjustments/Labs and Tests Ordered: Current medicines are reviewed at length with the patient today.  Concerns regarding medicines are outlined above.    Follow-up 1 year with Dr. Harl Bowie or APP   Signed, Levell July, NP 05/24/2020 9:44 AM    [facility] [place], [place], [place] [address] Phone: [phone]; Fax: [phone]

## 2020-05-24 ENCOUNTER — Ambulatory Visit: Payer: Medicare Other | Admitting: Family Medicine

## 2020-05-24 ENCOUNTER — Other Ambulatory Visit: Payer: Self-pay

## 2020-05-24 ENCOUNTER — Encounter: Payer: Self-pay | Admitting: Family Medicine

## 2020-05-24 VITALS — BP 126/60 | HR 85 | Resp 16 | Ht 64.0 in | Wt 189.6 lb

## 2020-05-24 DIAGNOSIS — R0600 Dyspnea, unspecified: Secondary | ICD-10-CM

## 2020-05-24 DIAGNOSIS — I38 Endocarditis, valve unspecified: Secondary | ICD-10-CM

## 2020-05-24 DIAGNOSIS — E782 Mixed hyperlipidemia: Secondary | ICD-10-CM | POA: Diagnosis not present

## 2020-05-24 DIAGNOSIS — I10 Essential (primary) hypertension: Secondary | ICD-10-CM

## 2020-05-24 DIAGNOSIS — R002 Palpitations: Secondary | ICD-10-CM

## 2020-05-24 DIAGNOSIS — K219 Gastro-esophageal reflux disease without esophagitis: Secondary | ICD-10-CM

## 2020-05-24 DIAGNOSIS — R0609 Other forms of dyspnea: Secondary | ICD-10-CM

## 2020-05-24 NOTE — Patient Instructions (Signed)
Your physician wants you to follow-up in: 1 YEAR WITH MD You will receive a reminder letter in the mail two months in advance. If you don't receive a letter, please call our office to schedule the follow-up appointment.  Your physician recommends that you continue on your current medications as directed. Please refer to the Current Medication list given to you today.  Thank you for choosing [facility]!!   

## 2020-05-26 ENCOUNTER — Other Ambulatory Visit: Payer: Self-pay | Admitting: Family

## 2020-05-26 DIAGNOSIS — E1065 Type 1 diabetes mellitus with hyperglycemia: Secondary | ICD-10-CM

## 2020-05-27 ENCOUNTER — Telehealth: Payer: Self-pay

## 2020-05-27 NOTE — Telephone Encounter (Signed)
Appointment note changed- patient aware

## 2020-05-27 NOTE — Telephone Encounter (Signed)
This is fine 

## 2020-05-28 ENCOUNTER — Other Ambulatory Visit: Payer: Self-pay | Admitting: *Deleted

## 2020-05-28 ENCOUNTER — Other Ambulatory Visit: Payer: Self-pay | Admitting: Family

## 2020-05-28 DIAGNOSIS — E1065 Type 1 diabetes mellitus with hyperglycemia: Secondary | ICD-10-CM

## 2020-05-28 MED ORDER — INSULIN GLARGINE 100 UNIT/ML ~~LOC~~ SOLN: SUBCUTANEOUS | 5 refills | Status: DC

## 2020-05-31 ENCOUNTER — Telehealth: Payer: Self-pay | Admitting: Family Medicine

## 2020-05-31 NOTE — Telephone Encounter (Signed)
Patient called regarding a statement that she received from [facility] in regards to a recent echo. She would like to speak with someone.

## 2020-06-03 ENCOUNTER — Ambulatory Visit (INDEPENDENT_AMBULATORY_CARE_PROVIDER_SITE_OTHER): Payer: Medicare Other | Admitting: Family

## 2020-06-03 ENCOUNTER — Encounter: Payer: Self-pay | Admitting: Family

## 2020-06-03 VITALS — BP 130/73

## 2020-06-03 DIAGNOSIS — E039 Hypothyroidism, unspecified: Secondary | ICD-10-CM | POA: Diagnosis not present

## 2020-06-03 DIAGNOSIS — E1149 Type 2 diabetes mellitus with other diabetic neurological complication: Secondary | ICD-10-CM

## 2020-06-03 DIAGNOSIS — E1165 Type 2 diabetes mellitus with hyperglycemia: Secondary | ICD-10-CM

## 2020-06-03 DIAGNOSIS — J4520 Mild intermittent asthma, uncomplicated: Secondary | ICD-10-CM | POA: Diagnosis not present

## 2020-06-03 DIAGNOSIS — I340 Nonrheumatic mitral (valve) insufficiency: Secondary | ICD-10-CM

## 2020-06-03 DIAGNOSIS — E669 Obesity, unspecified: Secondary | ICD-10-CM

## 2020-06-03 DIAGNOSIS — D649 Anemia, unspecified: Secondary | ICD-10-CM

## 2020-06-03 DIAGNOSIS — M1611 Unilateral primary osteoarthritis, right hip: Secondary | ICD-10-CM

## 2020-06-03 DIAGNOSIS — I10 Essential (primary) hypertension: Secondary | ICD-10-CM

## 2020-06-03 DIAGNOSIS — Z794 Long term (current) use of insulin: Secondary | ICD-10-CM

## 2020-06-03 DIAGNOSIS — Z96649 Presence of unspecified artificial hip joint: Secondary | ICD-10-CM

## 2020-06-03 DIAGNOSIS — F411 Generalized anxiety disorder: Secondary | ICD-10-CM

## 2020-06-03 MED ORDER — GABAPENTIN 100 MG PO CAPS: 100.0000 mg | ORAL_CAPSULE | Freq: Three times a day (TID) | ORAL | 3 refills | Status: DC

## 2020-06-03 NOTE — Progress Notes (Signed)
Virtual Visit via telephone Note Due to COVID-19 pandemic this visit was conducted virtually. This visit type was conducted due to national recommendations for restrictions regarding the COVID-19 Pandemic (e.g. social distancing, sheltering in place) in an effort to limit this patient's exposure and mitigate transmission in our community. All issues noted in this document were discussed and addressed.  A physical exam was not performed with this format.  I connected with [name] on 06/03/20 at 10:27 AM  by telephone and verified that I am speaking with the correct person using two identifiers. [name] is currently located at home and no one is currently with her during visit. The provider, Evelina Dun, FNP is located in their office at time of visit.  I discussed the limitations, risks, security and privacy concerns of performing an evaluation and management service by telephone and the availability of in person appointments. I also discussed with the patient that there may be a patient responsible charge related to this service. The patient expressed understanding and agreed to proceed.   History and Present Illness:  Pt calls the office today for chronic follow up. She is followed Cardiologists annually for mitral insufficiency.  She reports she had blurred vision and she stopped her Celexa, Fosamax, and Gabapentin. She states her vision improved after stopping these. She did see her ophthalmologist and was told she needed better control of her blood sugars.  Diabetes She presents for her follow-up diabetic visit. She has type 2 diabetes mellitus. Her disease course has been stable. Hypoglycemia symptoms include nervousness/anxiousness. Associated symptoms include blurred vision and fatigue. Pertinent negatives for diabetes include no foot paresthesias. Symptoms are stable. Diabetic complications include peripheral neuropathy. Pertinent negatives for diabetic complications include  no CVA. Risk factors for coronary artery disease include dyslipidemia, diabetes mellitus, hypertension, sedentary lifestyle and post-menopausal. She is following a generally unhealthy diet. Her overall blood glucose range is 110-130 mg/dl. An ACE inhibitor/angiotensin II receptor blocker is being taken.  Hypertension This is a chronic problem. The current episode started more than 1 year ago. The problem has been resolved since onset. Associated symptoms include anxiety and blurred vision. Pertinent negatives include no peripheral edema or shortness of breath. Risk factors for coronary artery disease include dyslipidemia, obesity and sedentary lifestyle. The current treatment provides moderate improvement. There is no history of CVA or heart failure. Identifiable causes of hypertension include a thyroid problem.  Asthma She complains of wheezing. There is no cough or shortness of breath. This is a chronic problem. The current episode started more than 1 year ago. The problem occurs intermittently. Associated symptoms include nasal congestion. Pertinent negatives include no rhinorrhea or sneezing. She reports moderate improvement on treatment. Her symptoms are not alleviated by rest. Her past medical history is significant for asthma.  Thyroid Problem Presents for follow-up visit. Symptoms include anxiety and fatigue. Patient reports no depressed mood or diarrhea. The symptoms have been stable. There is no history of heart failure.  Arthritis Presents for follow-up visit. She complains of pain and stiffness. The symptoms have been stable. Affected locations include the left hip, right hip, left knee and right knee. Her pain is at a severity of 10/10. Associated symptoms include fatigue. Pertinent negatives include no diarrhea.  Anxiety Presents for follow-up visit. Symptoms include excessive worry, irritability and nervous/anxious behavior. Patient reports no depressed mood or shortness of breath. Symptoms  occur most days. The severity of symptoms is moderate. The quality of sleep is Bright.  Her past medical history is significant for asthma.      Review of Systems  Constitutional: Positive for fatigue and irritability.  HENT: Negative for rhinorrhea and sneezing.   Eyes: Positive for blurred vision.  Respiratory: Positive for wheezing. Negative for cough and shortness of breath.   Gastrointestinal: Negative for diarrhea.  Musculoskeletal: Positive for arthritis and stiffness.  Psychiatric/Behavioral: The patient is nervous/anxious.   All other systems reviewed and are negative.    Observations/Objective: No SOB or distress noted   Assessment and Plan: [name] comes in today with chief complaint of No chief complaint on file.   Diagnosis and orders addressed:  1. Moderate mitral insufficiency - CMP14+EGFR; Future - CBC with Differential/Platelet; Future  2. Primary hypertension - CMP14+EGFR; Future - CBC with Differential/Platelet; Future  3. Mild intermittent asthma, unspecified whether complicated - QAS34+HDQQ; Future - CBC with Differential/Platelet; Future  4. Hypothyroidism, unspecified type - CMP14+EGFR; Future - CBC with Differential/Platelet; Future - TSH; Future  5. Type 2 diabetes mellitus with hyperglycemia, with long-term current use of insulin (HCC) - Bayer DCA Hb A1c Waived; Future - CMP14+EGFR; Future - CBC with Differential/Platelet; Future - Microalbumin / creatinine urine ratio; Future  6. Primary osteoarthritis of right hip - CMP14+EGFR; Future - CBC with Differential/Platelet; Future  7. Generalized anxiety disorder - CMP14+EGFR; Future - CBC with Differential/Platelet; Future  8. Anemia, unspecified type - CMP14+EGFR; Future - CBC with Differential/Platelet; Future  9. Obesity (BMI 30-39.9) - CMP14+EGFR; Future - CBC with Differential/Platelet; Future  10. Status post total replacement of hip, unspecified laterality -  CMP14+EGFR; Future - CBC with Differential/Platelet; Future  11. Other diabetic neurological complication associated with type 2 diabetes mellitus ([place]) Will start gabapentin 100 mg TID prn  - gabapentin (NEURONTIN) 100 MG capsule; Take 1 capsule (100 mg total) by mouth 3 (three) times daily.  Dispense: 90 capsule; Refill: 3   Labs pending Health Maintenance reviewed Diet and exercise encouraged  Follow up plan: 3 months, she does not want referral to Pulmonologist  until she pays her Cardiologists.       I discussed the assessment and treatment plan with the patient. The patient was provided an opportunity to ask questions and all were answered. The patient agreed with the plan and demonstrated an understanding of the instructions.   The patient was advised to call back or seek an in-person evaluation if the symptoms worsen or if the condition fails to improve as anticipated.  The above assessment and management plan was discussed with the patient. The patient verbalized understanding of and has agreed to the management plan. Patient is aware to call the clinic if symptoms persist or worsen. Patient is aware when to return to the clinic for a follow-up visit. Patient educated on when it is appropriate to go to the emergency department.   Time call ended:  10:48 AM  I provided 21 minutes of non-face-to-face time during this encounter.    Evelina Dun, FNP

## 2020-06-08 ENCOUNTER — Other Ambulatory Visit: Payer: Medicare Other

## 2020-06-08 ENCOUNTER — Other Ambulatory Visit: Payer: Self-pay

## 2020-06-08 DIAGNOSIS — F411 Generalized anxiety disorder: Secondary | ICD-10-CM

## 2020-06-08 DIAGNOSIS — E039 Hypothyroidism, unspecified: Secondary | ICD-10-CM

## 2020-06-08 DIAGNOSIS — I10 Essential (primary) hypertension: Secondary | ICD-10-CM

## 2020-06-08 DIAGNOSIS — Z96649 Presence of unspecified artificial hip joint: Secondary | ICD-10-CM

## 2020-06-08 DIAGNOSIS — I340 Nonrheumatic mitral (valve) insufficiency: Secondary | ICD-10-CM

## 2020-06-08 DIAGNOSIS — E1165 Type 2 diabetes mellitus with hyperglycemia: Secondary | ICD-10-CM | POA: Diagnosis not present

## 2020-06-08 DIAGNOSIS — M1611 Unilateral primary osteoarthritis, right hip: Secondary | ICD-10-CM

## 2020-06-08 DIAGNOSIS — J4520 Mild intermittent asthma, uncomplicated: Secondary | ICD-10-CM | POA: Diagnosis not present

## 2020-06-08 DIAGNOSIS — D649 Anemia, unspecified: Secondary | ICD-10-CM

## 2020-06-08 DIAGNOSIS — E669 Obesity, unspecified: Secondary | ICD-10-CM

## 2020-06-08 DIAGNOSIS — Z794 Long term (current) use of insulin: Secondary | ICD-10-CM | POA: Diagnosis not present

## 2020-06-08 LAB — BAYER DCA HB A1C WAIVED: HB A1C (BAYER DCA - WAIVED): 7.4 % — ABNORMAL HIGH (ref <7.0)

## 2020-06-09 LAB — CMP14+EGFR
ALT: 12 IU/L (ref 0–32)
AST: 15 IU/L (ref 0–40)
Albumin/Globulin Ratio: 1.6 (ref 1.2–2.2)
Albumin: 4.5 g/dL (ref 3.8–4.8)
Alkaline Phosphatase: 190 IU/L — ABNORMAL HIGH (ref 44–121)
BUN/Creatinine Ratio: 16 (ref 12–28)
BUN: 19 mg/dL (ref 8–27)
Bilirubin Total: 0.2 mg/dL (ref 0.0–1.2)
CO2: 20 mmol/L (ref 20–29)
Calcium: 10.4 mg/dL — ABNORMAL HIGH (ref 8.7–10.3)
Chloride: 104 mmol/L (ref 96–106)
Creatinine, Ser: 1.2 mg/dL — ABNORMAL HIGH (ref 0.57–1.00)
GFR calc Af Amer: 54 mL/min/{1.73_m2} — ABNORMAL LOW (ref >59)
GFR calc non Af Amer: 47 mL/min/{1.73_m2} — ABNORMAL LOW (ref >59)
Globulin, Total: 2.8 g/dL (ref 1.5–4.5)
Glucose: 49 mg/dL — ABNORMAL LOW (ref 65–99)
Potassium: 4 mmol/L (ref 3.5–5.2)
Sodium: 143 mmol/L (ref 134–144)
Total Protein: 7.3 g/dL (ref 6.0–8.5)

## 2020-06-09 LAB — CBC WITH DIFFERENTIAL/PLATELET
Basophils Absolute: 0 10*3/uL (ref 0.0–0.2)
Basos: 0 %
EOS (ABSOLUTE): 0.1 10*3/uL (ref 0.0–0.4)
Eos: 1 %
Hematocrit: 41.8 % (ref 34.0–46.6)
Hemoglobin: 13.5 g/dL (ref 11.1–15.9)
Immature Grans (Abs): 0 10*3/uL (ref 0.0–0.1)
Immature Granulocytes: 0 %
Lymphocytes Absolute: 3.7 10*3/uL — ABNORMAL HIGH (ref 0.7–3.1)
Lymphs: 36 %
MCH: 25 pg — ABNORMAL LOW (ref 26.6–33.0)
MCHC: 32.3 g/dL (ref 31.5–35.7)
MCV: 77 fL — ABNORMAL LOW (ref 79–97)
Monocytes Absolute: 0.7 10*3/uL (ref 0.1–0.9)
Monocytes: 7 %
Neutrophils Absolute: 5.6 10*3/uL (ref 1.4–7.0)
Neutrophils: 56 %
Platelets: 293 10*3/uL (ref 150–450)
RBC: 5.41 x10E6/uL — ABNORMAL HIGH (ref 3.77–5.28)
RDW: 14.1 % (ref 11.7–15.4)
WBC: 10.2 10*3/uL (ref 3.4–10.8)

## 2020-06-09 LAB — TSH: TSH: 1.46 u[IU]/mL (ref 0.450–4.500)

## 2020-06-17 ENCOUNTER — Other Ambulatory Visit: Payer: Self-pay | Admitting: Family

## 2020-06-18 ENCOUNTER — Other Ambulatory Visit: Payer: Self-pay | Admitting: Family

## 2020-06-18 DIAGNOSIS — E1065 Type 1 diabetes mellitus with hyperglycemia: Secondary | ICD-10-CM

## 2020-06-30 ENCOUNTER — Other Ambulatory Visit: Payer: Self-pay | Admitting: Family Medicine

## 2020-06-30 ENCOUNTER — Other Ambulatory Visit: Payer: Self-pay | Admitting: Family

## 2020-06-30 DIAGNOSIS — K2100 Gastro-esophageal reflux disease with esophagitis, without bleeding: Secondary | ICD-10-CM

## 2020-06-30 DIAGNOSIS — E1065 Type 1 diabetes mellitus with hyperglycemia: Secondary | ICD-10-CM

## 2020-07-05 ENCOUNTER — Other Ambulatory Visit: Payer: Self-pay | Admitting: Family

## 2020-07-05 DIAGNOSIS — I10 Essential (primary) hypertension: Secondary | ICD-10-CM

## 2020-07-05 NOTE — Telephone Encounter (Signed)
Last OV 06/03/20, refills sent

## 2020-07-13 ENCOUNTER — Other Ambulatory Visit: Payer: Self-pay | Admitting: Family

## 2020-07-17 ENCOUNTER — Other Ambulatory Visit: Payer: Self-pay | Admitting: Family

## 2020-07-17 DIAGNOSIS — E1065 Type 1 diabetes mellitus with hyperglycemia: Secondary | ICD-10-CM

## 2020-08-01 ENCOUNTER — Other Ambulatory Visit: Payer: Self-pay | Admitting: Family

## 2020-08-03 DIAGNOSIS — E119 Type 2 diabetes mellitus without complications: Secondary | ICD-10-CM | POA: Diagnosis not present

## 2020-08-03 LAB — HM DIABETES EYE EXAM

## 2020-08-08 ENCOUNTER — Other Ambulatory Visit: Payer: Self-pay | Admitting: Family

## 2020-08-08 DIAGNOSIS — E1065 Type 1 diabetes mellitus with hyperglycemia: Secondary | ICD-10-CM

## 2020-09-02 ENCOUNTER — Encounter: Payer: Self-pay | Admitting: *Deleted

## 2020-09-07 ENCOUNTER — Ambulatory Visit: Payer: Medicare Other | Admitting: Family

## 2020-09-07 ENCOUNTER — Ambulatory Visit (INDEPENDENT_AMBULATORY_CARE_PROVIDER_SITE_OTHER): Payer: Medicare Other | Admitting: Nurse Practitioner

## 2020-09-07 ENCOUNTER — Telehealth: Payer: Self-pay

## 2020-09-07 ENCOUNTER — Other Ambulatory Visit: Payer: Self-pay | Admitting: Nurse Practitioner

## 2020-09-07 DIAGNOSIS — R300 Dysuria: Secondary | ICD-10-CM | POA: Insufficient documentation

## 2020-09-07 DIAGNOSIS — R399 Unspecified symptoms and signs involving the genitourinary system: Secondary | ICD-10-CM | POA: Diagnosis not present

## 2020-09-07 LAB — MICROSCOPIC EXAMINATION
Bacteria, UA: NONE SEEN
Epithelial Cells (non renal): NONE SEEN /hpf (ref 0–10)
RBC, Urine: NONE SEEN /hpf (ref 0–2)
WBC, UA: NONE SEEN /hpf (ref 0–5)

## 2020-09-07 LAB — URINALYSIS, COMPLETE
Bilirubin, UA: NEGATIVE
Glucose, UA: NEGATIVE
Ketones, UA: NEGATIVE
Leukocytes,UA: NEGATIVE
Nitrite, UA: NEGATIVE
Protein,UA: NEGATIVE
RBC, UA: NEGATIVE
Specific Gravity, UA: 1.01 (ref 1.005–1.030)
Urobilinogen, Ur: 0.2 mg/dL (ref 0.2–1.0)
pH, UA: 5 (ref 5.0–7.5)

## 2020-09-07 MED ORDER — CIPROFLOXACIN HCL 250 MG PO TABS: 250.0000 mg | ORAL_TABLET | Freq: Two times a day (BID) | ORAL | 0 refills | Status: DC

## 2020-09-07 NOTE — Telephone Encounter (Signed)
Pt asked to switch pcp to [facility]

## 2020-09-07 NOTE — Telephone Encounter (Signed)
I am only accepting immediate family members/ children right now due to scheduling issues with current patients.  Hopefully, this will change in next few months but for now I'd recommend staying with current PCP until availability opens.

## 2020-09-07 NOTE — Telephone Encounter (Signed)
Patient aware and would like to switch to [name]- please advise.

## 2020-09-07 NOTE — Progress Notes (Signed)
   Virtual Visit  Note Due to COVID-19 pandemic this visit was conducted virtually. This visit type was conducted due to national recommendations for restrictions regarding the COVID-19 Pandemic (e.g. social distancing, sheltering in place) in an effort to limit this patient's exposure and mitigate transmission in our community. All issues noted in this document were discussed and addressed.  A physical exam was not performed with this format.  I connected with [name] on 09/08/20 at  2 PM by phone and verified that I am speaking with the correct person using two identifiers. [name] is currently located at home during visit. The provider, Ivy Lynn, NP is located in their office at time of visit.  I discussed the limitations, risks, security and privacy concerns of performing an evaluation and management service by phone and the availability of in person appointments. I also discussed with the patient that there may be a patient responsible charge related to this service. The patient expressed understanding and agreed to proceed.   History and Present Illness:  Urinary Tract Infection  This is a new problem. The current episode started yesterday. The problem occurs intermittently. The problem has been gradually improving. The pain is at a severity of 0/10. There has been no fever. Associated symptoms include urgency. Pertinent negatives include no chills, discharge, flank pain, hematuria or nausea. She has tried nothing for the symptoms. The treatment provided no relief. There is no history of recurrent UTIs.      Review of Systems  Constitutional: Negative for chills and fever.  HENT: Negative.   Cardiovascular: Negative.   Gastrointestinal: Negative for nausea.  Genitourinary: Positive for urgency. Negative for flank pain and hematuria.  Musculoskeletal: Negative.   All other systems reviewed and are negative.    Observations/Objective: Televisit-patient did not  sound to be in distress  Assessment and Plan:  UTI symptoms Patient is reporting no urinary symptoms in the last 24 hours.  Completed urinalysis results are normal.  Advised patient to increase hydration and follow-up with worsening unresolved symptoms.  Education provided to patient, patient verbalized understanding.  Follow Up Instructions: Follow-up with worsening symptoms    I discussed the assessment and treatment plan with the patient. The patient was provided an opportunity to ask questions and all were answered. The patient agreed with the plan and demonstrated an understanding of the instructions.   The patient was advised to call back or seek an in-person evaluation if the symptoms worsen or if the condition fails to improve as anticipated.  The above assessment and management plan was discussed with the patient. The patient verbalized understanding of and has agreed to the management plan. Patient is aware to call the clinic if symptoms persist or worsen. Patient is aware when to return to the clinic for a follow-up visit. Patient educated on when it is appropriate to go to the emergency department.   Time call ended: 2:8 pm  I provided 8 minutes of telephone time during this encounter.    Ivy Lynn, NP

## 2020-09-08 ENCOUNTER — Encounter: Payer: Self-pay | Admitting: Nurse Practitioner

## 2020-09-08 NOTE — Assessment & Plan Note (Signed)
Patient is reporting no urinary symptoms in the last 24 hours.  Completed urinalysis results are normal.  Advised patient to increase hydration and follow-up with worsening unresolved symptoms.  Education provided to patient, patient verbalized understanding.

## 2020-09-08 NOTE — Telephone Encounter (Signed)
Ok with me 

## 2020-09-09 ENCOUNTER — Telehealth: Payer: Self-pay

## 2020-09-09 LAB — URINE CULTURE: Organism ID, Bacteria: NO GROWTH

## 2020-09-09 MED ORDER — FLUCONAZOLE 150 MG PO TABS: 150.0000 mg | ORAL_TABLET | ORAL | 0 refills | Status: DC | PRN

## 2020-09-09 NOTE — Telephone Encounter (Signed)
This is fine with me. I have already approved this.

## 2020-09-09 NOTE — Telephone Encounter (Signed)
Je, please advise.  Are you okay with accepting her as a new patient.  Alyse Low has approved.

## 2020-09-09 NOTE — Telephone Encounter (Signed)
Patient aware.

## 2020-09-09 NOTE — Telephone Encounter (Signed)
Its fine by me

## 2020-09-09 NOTE — Telephone Encounter (Signed)
I recommend completing antibiotic. I have sent in diflucan Prescription sent to pharmacy.

## 2020-09-10 NOTE — Telephone Encounter (Signed)
Patient aware, she has a 3 month follow up appointment scheduled with Alyse Low next week and will keep that for now.

## 2020-09-11 ENCOUNTER — Other Ambulatory Visit: Payer: Self-pay | Admitting: Family

## 2020-09-11 DIAGNOSIS — E1065 Type 1 diabetes mellitus with hyperglycemia: Secondary | ICD-10-CM

## 2020-09-15 ENCOUNTER — Ambulatory Visit (INDEPENDENT_AMBULATORY_CARE_PROVIDER_SITE_OTHER): Payer: Medicare Other | Admitting: Family

## 2020-09-15 ENCOUNTER — Other Ambulatory Visit: Payer: Self-pay

## 2020-09-15 ENCOUNTER — Encounter: Payer: Self-pay | Admitting: Family

## 2020-09-15 VITALS — BP 127/78 | HR 82 | Temp 97.7°F | Ht 64.0 in | Wt 193.4 lb

## 2020-09-15 DIAGNOSIS — M159 Polyosteoarthritis, unspecified: Secondary | ICD-10-CM

## 2020-09-15 DIAGNOSIS — E039 Hypothyroidism, unspecified: Secondary | ICD-10-CM

## 2020-09-15 DIAGNOSIS — I10 Essential (primary) hypertension: Secondary | ICD-10-CM | POA: Diagnosis not present

## 2020-09-15 DIAGNOSIS — Z794 Long term (current) use of insulin: Secondary | ICD-10-CM | POA: Diagnosis not present

## 2020-09-15 DIAGNOSIS — N189 Chronic kidney disease, unspecified: Secondary | ICD-10-CM

## 2020-09-15 DIAGNOSIS — E1165 Type 2 diabetes mellitus with hyperglycemia: Secondary | ICD-10-CM

## 2020-09-15 DIAGNOSIS — E559 Vitamin D deficiency, unspecified: Secondary | ICD-10-CM | POA: Diagnosis not present

## 2020-09-15 DIAGNOSIS — J4520 Mild intermittent asthma, uncomplicated: Secondary | ICD-10-CM | POA: Diagnosis not present

## 2020-09-15 DIAGNOSIS — E669 Obesity, unspecified: Secondary | ICD-10-CM

## 2020-09-15 DIAGNOSIS — F411 Generalized anxiety disorder: Secondary | ICD-10-CM

## 2020-09-15 LAB — CBC WITH DIFFERENTIAL/PLATELET
Basophils Absolute: 0 10*3/uL (ref 0.0–0.2)
Basos: 0 %
EOS (ABSOLUTE): 0.2 10*3/uL (ref 0.0–0.4)
Eos: 2 %
Hematocrit: 40.8 % (ref 34.0–46.6)
Hemoglobin: 13.2 g/dL (ref 11.1–15.9)
Immature Grans (Abs): 0 10*3/uL (ref 0.0–0.1)
Immature Granulocytes: 0 %
Lymphocytes Absolute: 2.3 10*3/uL (ref 0.7–3.1)
Lymphs: 28 %
MCH: 24.5 pg — ABNORMAL LOW (ref 26.6–33.0)
MCHC: 32.4 g/dL (ref 31.5–35.7)
MCV: 76 fL — ABNORMAL LOW (ref 79–97)
Monocytes Absolute: 0.6 10*3/uL (ref 0.1–0.9)
Monocytes: 7 %
Neutrophils Absolute: 5 10*3/uL (ref 1.4–7.0)
Neutrophils: 63 %
Platelets: 246 10*3/uL (ref 150–450)
RBC: 5.38 x10E6/uL — ABNORMAL HIGH (ref 3.77–5.28)
RDW: 13.4 % (ref 11.7–15.4)
WBC: 8.1 10*3/uL (ref 3.4–10.8)

## 2020-09-15 LAB — CMP14+EGFR
ALT: 11 IU/L (ref 0–32)
AST: 13 IU/L (ref 0–40)
Albumin/Globulin Ratio: 1.5 (ref 1.2–2.2)
Albumin: 4.4 g/dL (ref 3.8–4.8)
Alkaline Phosphatase: 173 IU/L — ABNORMAL HIGH (ref 44–121)
BUN/Creatinine Ratio: 18 (ref 12–28)
BUN: 24 mg/dL (ref 8–27)
Bilirubin Total: 0.4 mg/dL (ref 0.0–1.2)
CO2: 20 mmol/L (ref 20–29)
Calcium: 10.5 mg/dL — ABNORMAL HIGH (ref 8.7–10.3)
Chloride: 103 mmol/L (ref 96–106)
Creatinine, Ser: 1.36 mg/dL — ABNORMAL HIGH (ref 0.57–1.00)
Globulin, Total: 2.9 g/dL (ref 1.5–4.5)
Glucose: 142 mg/dL — ABNORMAL HIGH (ref 65–99)
Potassium: 5 mmol/L (ref 3.5–5.2)
Sodium: 139 mmol/L (ref 134–144)
Total Protein: 7.3 g/dL (ref 6.0–8.5)
eGFR: 42 mL/min/{1.73_m2} — ABNORMAL LOW (ref >59)

## 2020-09-15 LAB — TSH: TSH: 5.34 u[IU]/mL — ABNORMAL HIGH (ref 0.450–4.500)

## 2020-09-15 LAB — BAYER DCA HB A1C WAIVED: HB A1C (BAYER DCA - WAIVED): 8.4 % — ABNORMAL HIGH (ref <7.0)

## 2020-09-15 MED ORDER — FLUCONAZOLE 150 MG PO TABS: 150.0000 mg | ORAL_TABLET | ORAL | 0 refills | Status: DC | PRN

## 2020-09-15 NOTE — Patient Instructions (Signed)
Vaginal Yeast Infection, Adult  Vaginal yeast infection is a condition that causes vaginal discharge as well as soreness, swelling, and redness (inflammation) of the vagina. This is a common condition. Some women get this infection frequently. What are the causes? This condition is caused by a change in the normal balance of the yeast (candida) and bacteria that live in the vagina. This change causes an overgrowth of yeast, which causes the inflammation. What increases the risk? The condition is more likely to develop in women who:  Take antibiotic medicines.  Have diabetes.  Take birth control pills.  Are pregnant.  Douche often.  Have a weak body defense system (immune system).  Have been taking steroid medicines for a long time.  Frequently wear tight clothing. What are the signs or symptoms? Symptoms of this condition include:  White, thick, creamy vaginal discharge.  Swelling, itching, redness, and irritation of the vagina. The lips of the vagina (vulva) may be affected as well.  Pain or a burning feeling while urinating.  Pain during sex. How is this diagnosed? This condition is diagnosed based on:  Your medical history.  A physical exam.  A pelvic exam. Your health care provider will examine a sample of your vaginal discharge under a microscope. Your health care provider may send this sample for testing to confirm the diagnosis. How is this treated? This condition is treated with medicine. Medicines may be over-the-counter or prescription. You may be told to use one or more of the following:  Medicine that is taken by mouth (orally).  Medicine that is applied as a cream (topically).  Medicine that is inserted directly into the vagina (suppository). Follow these instructions at home: Lifestyle  Do not have sex until your health care provider approves. Tell your sex partner that you have a yeast infection. That person should go to his or her health care  provider and ask if they should also be treated.  Do not wear tight clothes, such as pantyhose or tight pants.  Wear breathable cotton underwear. General instructions  Take or apply over-the-counter and prescription medicines only as told by your health care provider.  Eat more yogurt. This may help to keep your yeast infection from returning.  Do not use tampons until your health care provider approves.  Try taking a sitz bath to help with discomfort. This is a warm water bath that is taken while you are sitting down. The water should only come up to your hips and should cover your buttocks. Do this 3-4 times per day or as told by your health care provider.  Do not douche.  If you have diabetes, keep your blood sugar levels under control.  Keep all follow-up visits as told by your health care provider. This is important.   Contact a health care provider if:  You have a fever.  Your symptoms go away and then return.  Your symptoms do not get better with treatment.  Your symptoms get worse.  You have new symptoms.  You develop blisters in or around your vagina.  You have blood coming from your vagina and it is not your menstrual period.  You develop pain in your abdomen. Summary  Vaginal yeast infection is a condition that causes discharge as well as soreness, swelling, and redness (inflammation) of the vagina.  This condition is treated with medicine. Medicines may be over-the-counter or prescription.  Take or apply over-the-counter and prescription medicines only as told by your health care provider.  Do not   douche. Do not have sex or use tampons until your health care provider approves.  Contact a health care provider if your symptoms do not get better with treatment or your symptoms go away and then return. This information is not intended to replace advice given to you by your health care provider. Make sure you discuss any questions you have with your health care  provider. Document Revised: 12/27/2018 Document Reviewed: 10/15/2017 Elsevier Patient Education  2021 Elsevier Inc.  

## 2020-09-15 NOTE — Progress Notes (Signed)
Subjective:    Patient ID: [name], female    DOB: November 11, 1950, 70 y.o.   MRN: [id number]  Chief Complaint  Patient presents with  . Diabetes  . Hypertension  . Dysuria    Was seen 03/29 with Je its about 8o % better but still has odor and itching with a little burning. Has one more to go on last Diflucan she will take tom   Pt calls the office todayfor chronic follow up. She is followed Cardiologists annually for mitral insufficiency. Diabetes She presents for her follow-up diabetic visit. She has type 2 diabetes mellitus. Her disease course has been stable. There are no hypoglycemic associated symptoms. Associated symptoms include blurred vision. Pertinent negatives for diabetes include no foot paresthesias. Symptoms are stable. Pertinent negatives for diabetic complications include no CVA, nephropathy, peripheral neuropathy or PVD. Risk factors for coronary artery disease include dyslipidemia, diabetes mellitus, hypertension and sedentary lifestyle. She is following a generally unhealthy diet. Her overall blood glucose range is 180-200 mg/dl.  Hypertension This is a chronic problem. The current episode started more than 1 year ago. The problem has been waxing and waning since onset. The problem is uncontrolled. Associated symptoms include blurred vision, malaise/fatigue and peripheral edema. Pertinent negatives include no shortness of breath. Risk factors for coronary artery disease include dyslipidemia and obesity. The current treatment provides moderate improvement. There is no history of CVA, heart failure or PVD. Identifiable causes of hypertension include a thyroid problem.  Asthma There is no cough, shortness of breath or wheezing. This is a chronic problem. The current episode started more than 1 year ago. Associated symptoms include malaise/fatigue. Her past medical history is significant for asthma.  Vaginal Itching The patient's primary symptoms include genital itching. The  patient's pertinent negatives include no vaginal discharge. This is a recurrent problem. The current episode started more than 1 month ago. The problem occurs intermittently.  Thyroid Problem Presents for follow-up visit. Symptoms include dry skin. Patient reports no depressed mood or heat intolerance. The symptoms have been stable. There is no history of heart failure.  Arthritis Presents for follow-up visit. She complains of pain and stiffness. The symptoms have been stable. Affected locations include the left knee, right knee, left MCP and right MCP. Her pain is at a severity of 8/10.      Review of Systems  Constitutional: Positive for malaise/fatigue.  Eyes: Positive for blurred vision.  Respiratory: Negative for cough, shortness of breath and wheezing.   Endocrine: Negative for heat intolerance.  Genitourinary: Negative for vaginal discharge.  Musculoskeletal: Positive for arthritis and stiffness.  All other systems reviewed and are negative.      Objective:   Physical Exam Vitals reviewed.  Constitutional:      General: She is not in acute distress.    Appearance: She is well-developed.  HENT:     Head: Normocephalic and atraumatic.     Right Ear: Tympanic membrane normal.     Left Ear: Tympanic membrane normal.  Eyes:     Pupils: Pupils are equal, round, and reactive to light.  Neck:     Thyroid: No thyromegaly.  Cardiovascular:     Rate and Rhythm: Normal rate and regular rhythm.     Heart sounds: Normal heart sounds. No murmur heard.   Pulmonary:     Effort: Pulmonary effort is normal. No respiratory distress.     Breath sounds: Normal breath sounds. No wheezing.  Abdominal:     General: Bowel sounds  are normal. There is no distension.     Palpations: Abdomen is soft.     Tenderness: There is no abdominal tenderness.  Musculoskeletal:        General: Normal range of motion.     Cervical back: Normal range of motion and neck supple.  Skin:    General: Skin  is warm and dry.  Neurological:     Mental Status: She is alert and oriented to person, place, and time.     Cranial Nerves: No cranial nerve deficit.     Deep Tendon Reflexes: Reflexes are normal and symmetric.  Psychiatric:        Behavior: Behavior normal.        Thought Content: Thought content normal.        Judgment: Judgment normal.          BP 127/78   Pulse 82   Temp 97.7 F (36.5 C) (Temporal)   Ht 5' 4" (1.626 m)   Wt 193 lb 6.4 oz (87.7 kg)   BMI 33.20 kg/m   Assessment & Plan:  [name] comes in today with chief complaint of Diabetes, Hypertension, and Dysuria (Was seen 03/29 with Je its about 8o % better but still has odor and itching with a little burning. Has one more to go on last Diflucan she will take tom)   Diagnosis and orders addressed:  1. Primary hypertension - CMP14+EGFR - CBC with Differential/Platelet  2. Mild intermittent asthma, unspecified whether complicated - IOM35+DHRC - CBC with Differential/Platelet  3. Type 2 diabetes mellitus with hyperglycemia, with long-term current use of insulin (HCC) - Bayer DCA Hb A1c Waived - CMP14+EGFR - CBC with Differential/Platelet  4. Hypothyroidism, unspecified type - CMP14+EGFR - CBC with Differential/Platelet - TSH  5. Chronic kidney disease, unspecified CKD stage - CMP14+EGFR - CBC with Differential/Platelet  6. Obesity (BMI 30-39.9) - CMP14+EGFR - CBC with Differential/Platelet  7. Generalized anxiety disorder - CMP14+EGFR - CBC with Differential/Platelet  8. Vitamin D deficiency - CMP14+EGFR - CBC with Differential/Platelet  9. Generalized OA - CMP14+EGFR - CBC with Differential/Platelet   Labs pending Health Maintenance reviewed Diet and exercise encouraged  Follow up plan: 3 months    Evelina Dun, FNP

## 2020-09-16 ENCOUNTER — Other Ambulatory Visit: Payer: Self-pay | Admitting: Family

## 2020-09-16 ENCOUNTER — Other Ambulatory Visit: Payer: Self-pay | Admitting: Family Medicine

## 2020-09-16 MED ORDER — LEVOTHYROXINE SODIUM 88 MCG PO TABS
88.0000 ug | ORAL_TABLET | Freq: Every day | ORAL | 2 refills | Status: DC
Start: 2020-09-16 — End: 2021-06-22

## 2020-09-16 MED ORDER — METFORMIN HCL ER 750 MG PO TB24
750.0000 mg | ORAL_TABLET | Freq: Every day | ORAL | 1 refills | Status: DC
Start: 2020-09-16 — End: 2021-03-14

## 2020-09-17 ENCOUNTER — Other Ambulatory Visit: Payer: Self-pay

## 2020-09-17 ENCOUNTER — Other Ambulatory Visit: Payer: Medicare Other

## 2020-09-17 DIAGNOSIS — E1165 Type 2 diabetes mellitus with hyperglycemia: Secondary | ICD-10-CM | POA: Diagnosis not present

## 2020-09-17 DIAGNOSIS — Z794 Long term (current) use of insulin: Secondary | ICD-10-CM

## 2020-09-18 LAB — MICROALBUMIN / CREATININE URINE RATIO
Creatinine, Urine: 85 mg/dL
Microalb/Creat Ratio: 5 mg/g creat (ref 0–29)
Microalbumin, Urine: 4.2 ug/mL

## 2020-09-18 LAB — PARATHYROID HORMONE, INTACT (NO CA): PTH: 47 pg/mL (ref 15–65)

## 2020-09-24 ENCOUNTER — Other Ambulatory Visit: Payer: Self-pay | Admitting: Family

## 2020-10-06 ENCOUNTER — Other Ambulatory Visit: Payer: Self-pay | Admitting: Family

## 2020-10-06 DIAGNOSIS — E1065 Type 1 diabetes mellitus with hyperglycemia: Secondary | ICD-10-CM

## 2020-10-11 ENCOUNTER — Other Ambulatory Visit: Payer: Self-pay | Admitting: Family

## 2020-10-11 DIAGNOSIS — K2100 Gastro-esophageal reflux disease with esophagitis, without bleeding: Secondary | ICD-10-CM

## 2020-10-13 ENCOUNTER — Other Ambulatory Visit: Payer: Self-pay | Admitting: Family

## 2020-10-13 DIAGNOSIS — I10 Essential (primary) hypertension: Secondary | ICD-10-CM

## 2020-10-19 ENCOUNTER — Telehealth: Payer: Self-pay | Admitting: Family

## 2020-10-19 NOTE — Telephone Encounter (Signed)
Why does she want to stop this? Her last A1C was elevated. Please give her an appointment with Almyra Free for diabetic education.

## 2020-10-19 NOTE — Telephone Encounter (Signed)
Pt would like to stop taking jardiance but wanted to ask Alyse Low before she stopped.

## 2020-10-19 NOTE — Telephone Encounter (Signed)
Patient is concerned about the side effects of Jardiance.  She said she is having the stomach pains and problems with her urine.  I scheduled her an appointment to see Almyra Free on Thursday.

## 2020-10-21 ENCOUNTER — Other Ambulatory Visit: Payer: Self-pay

## 2020-10-21 ENCOUNTER — Ambulatory Visit (INDEPENDENT_AMBULATORY_CARE_PROVIDER_SITE_OTHER): Payer: Medicare Other | Admitting: Pharmacist

## 2020-10-21 DIAGNOSIS — Z794 Long term (current) use of insulin: Secondary | ICD-10-CM

## 2020-10-21 DIAGNOSIS — E1165 Type 2 diabetes mellitus with hyperglycemia: Secondary | ICD-10-CM

## 2020-10-21 MED ORDER — RYBELSUS 3 MG PO TABS: 3.0000 mg | ORAL_TABLET | Freq: Every day | ORAL | 0 refills | Status: AC

## 2020-10-21 MED ORDER — FLUCONAZOLE 150 MG PO TABS: 150.0000 mg | ORAL_TABLET | ORAL | 0 refills | Status: DC | PRN

## 2020-10-21 NOTE — Progress Notes (Signed)
    10/21/2020 Name: [name] MRN: [id number] DOB: 09-Sep-1950   S:  89 yoF Presents for diabetes evaluation, education, and management Patient was referred and last seen by Primary Care Provider on 09/15/20.  Insurance coverage/medication affordability: [employer] medicare  Patient reports adherence with medications. . Current diabetes medications include: jardiance, metformin, humalog, lantus . Current hypertension medications include: metoprolol, clonidine, quinapril Goal 130/80 . Current hyperlipidemia medications include: atorvastatin   Patient denies hypoglycemic events.   Patient reported dietary habits: Eats 3 meals/day Discussed meal planning options and Plate method for healthy eating . Avoid sugary drinks and desserts . Incorporate balanced protein, non starchy veggies, 1 serving of carbohydrate with each meal . Increase water intake . Increase physical activity as able   Patient-reported exercise habits: n/a   Patient reports nocturia (nighttime urination).  Patient reports neuropathy (nerve pain). Gabapentin helping  Patient reports visual changes.  Patient reports self foot exams.    O:  Lab Results  Component Value Date   HGBA1C 8.4 (H) 09/15/2020   Lipid Panel     Component Value Date/Time   CHOL 136 08/06/2019 1144   TRIG 99 08/06/2019 1144   HDL 36 (L) 08/06/2019 1144   CHOLHDL 3.8 08/06/2019 1144   LDLCALC 81 08/06/2019 1144   Home fasting blood sugars: 126  2 hour post-meal/random blood sugars: 200s.    Clinical Atherosclerotic Cardiovascular Disease (ASCVD): No   The 10-year ASCVD risk score Mikey Bussing DC Jr., et al., 2013) is: 17.3%   Values used to calculate the score:     Age: 70 years     Sex: Female     Is Non-Hispanic African American: Yes     Diabetic: Yes     Tobacco smoker: No     Systolic Blood Pressure: 102 mmHg     Is BP treated: Yes     HDL Cholesterol: 36 mg/dL     Total Cholesterol: 136 mg/dL    A/P:  Diabetes T2DM  currently uncontrolled. Patient is able to verbalize appropriate hypoglycemia management plan. Patient is adherent with medication. Control is suboptimal due to current infection/yeast/UTI.  -Continue Lantus 40units nightly  -Continue Humalog 10-20 units with meals  -Continue metformin GFR 42--watch renal function--we will d/c this at next visit   -Add Rybelsus 3mg  daily   Denies personal and family history of thyroid/MTC   -Send off for libre 2 CGM  -Discontinue jardiance --patient having recurrent yeast infections from this medications  -Extensively discussed pathophysiology of diabetes, recommended lifestyle interventions, dietary effects on blood sugar control  -Counseled on s/sx of and management of hypoglycemia  -Next A1C anticipated 3 months.  Written patient instructions provided.  Total time in face to face counseling 30 minutes.   Follow up Pharmacist/PCP Clinic Visit ON 11/16/20.   Regina Eck, PharmD, BCPS Clinical Pharmacist, [facility]  [facility] Phone [phone]

## 2020-11-08 ENCOUNTER — Other Ambulatory Visit: Payer: Self-pay | Admitting: Family

## 2020-11-08 DIAGNOSIS — E1065 Type 1 diabetes mellitus with hyperglycemia: Secondary | ICD-10-CM

## 2020-11-15 ENCOUNTER — Other Ambulatory Visit: Payer: Self-pay | Admitting: Family

## 2020-11-15 DIAGNOSIS — E1122 Type 2 diabetes mellitus with diabetic chronic kidney disease: Secondary | ICD-10-CM

## 2020-11-15 DIAGNOSIS — Z794 Long term (current) use of insulin: Secondary | ICD-10-CM

## 2020-11-16 ENCOUNTER — Ambulatory Visit (INDEPENDENT_AMBULATORY_CARE_PROVIDER_SITE_OTHER): Payer: Medicare Other | Admitting: Family

## 2020-11-16 ENCOUNTER — Other Ambulatory Visit: Payer: Self-pay

## 2020-11-16 ENCOUNTER — Encounter: Payer: Self-pay | Admitting: Family

## 2020-11-16 VITALS — BP 120/75 | HR 96 | Temp 97.3°F | Ht 64.0 in | Wt 193.2 lb

## 2020-11-16 DIAGNOSIS — E669 Obesity, unspecified: Secondary | ICD-10-CM

## 2020-11-16 DIAGNOSIS — Z794 Long term (current) use of insulin: Secondary | ICD-10-CM

## 2020-11-16 DIAGNOSIS — E039 Hypothyroidism, unspecified: Secondary | ICD-10-CM | POA: Diagnosis not present

## 2020-11-16 DIAGNOSIS — M1611 Unilateral primary osteoarthritis, right hip: Secondary | ICD-10-CM

## 2020-11-16 DIAGNOSIS — I10 Essential (primary) hypertension: Secondary | ICD-10-CM

## 2020-11-16 DIAGNOSIS — N189 Chronic kidney disease, unspecified: Secondary | ICD-10-CM | POA: Diagnosis not present

## 2020-11-16 DIAGNOSIS — J4520 Mild intermittent asthma, uncomplicated: Secondary | ICD-10-CM

## 2020-11-16 DIAGNOSIS — R399 Unspecified symptoms and signs involving the genitourinary system: Secondary | ICD-10-CM

## 2020-11-16 DIAGNOSIS — E1165 Type 2 diabetes mellitus with hyperglycemia: Secondary | ICD-10-CM | POA: Diagnosis not present

## 2020-11-16 DIAGNOSIS — I340 Nonrheumatic mitral (valve) insufficiency: Secondary | ICD-10-CM

## 2020-11-16 DIAGNOSIS — F411 Generalized anxiety disorder: Secondary | ICD-10-CM

## 2020-11-16 LAB — URINALYSIS, COMPLETE
Bilirubin, UA: NEGATIVE
Glucose, UA: NEGATIVE
Leukocytes,UA: NEGATIVE
Nitrite, UA: NEGATIVE
RBC, UA: NEGATIVE
Specific Gravity, UA: 1.025 (ref 1.005–1.030)
Urobilinogen, Ur: 0.2 mg/dL (ref 0.2–1.0)
pH, UA: 6 (ref 5.0–7.5)

## 2020-11-16 LAB — MICROSCOPIC EXAMINATION
Bacteria, UA: NONE SEEN
Epithelial Cells (non renal): NONE SEEN /hpf (ref 0–10)
RBC, Urine: NONE SEEN /hpf (ref 0–2)

## 2020-11-16 LAB — BAYER DCA HB A1C WAIVED: HB A1C (BAYER DCA - WAIVED): 7.1 % — ABNORMAL HIGH (ref <7.0)

## 2020-11-16 NOTE — Progress Notes (Signed)
Subjective:    Patient ID: [name], female    DOB: [birth year], 70 y.o.   MRN: [id number]  Chief Complaint  Patient presents with  . Diabetes  . Hypothyroidism   Ptcallsthe office todayfor chronic follow up. She is followed Cardiologists annually for mitral insufficiency. Diabetes She presents for her follow-up diabetic visit. She has type 2 diabetes mellitus. Her disease course has been stable. Hypoglycemia symptoms include nervousness/anxiousness. Associated symptoms include fatigue. Pertinent negatives for diabetes include no blurred vision and no foot paresthesias. Symptoms are stable. Diabetic complications include heart disease. Risk factors for coronary artery disease include dyslipidemia, diabetes mellitus, hypertension and sedentary lifestyle. She is following a generally unhealthy diet. Her overall blood glucose range is 110-130 mg/dl. Eye exam is current.  Hypertension This is a chronic problem. The current episode started more than 1 year ago. The problem has been resolved since onset. Associated symptoms include shortness of breath. Pertinent negatives include no blurred vision, malaise/fatigue or peripheral edema. Risk factors for coronary artery disease include dyslipidemia, obesity and sedentary lifestyle. The current treatment provides moderate improvement. Identifiable causes of hypertension include a thyroid problem.  Hyperlipidemia This is a chronic problem. The current episode started more than 1 year ago. Associated symptoms include shortness of breath. Current antihyperlipidemic treatment includes statins. The current treatment provides moderate improvement of lipids. Risk factors for coronary artery disease include dyslipidemia, diabetes mellitus, hypertension and a sedentary lifestyle.  Asthma She complains of cough and shortness of breath. This is a chronic problem. The current episode started more than 1 year ago. The problem occurs intermittently. Pertinent  negatives include no malaise/fatigue. Her past medical history is significant for asthma.  Urinary Tract Infection  This is a chronic problem. The current episode started more than 1 year ago. The problem occurs intermittently. The problem has been waxing and waning. The quality of the pain is described as burning. The pain is at a severity of 3/10. Associated symptoms include hesitancy, urgency and vomiting. Pertinent negatives include no hematuria. She has tried increased fluids for the symptoms. The treatment provided mild relief.  Arthritis Presents for follow-up visit. Affected locations include the left knee, right knee, right MCP, left MCP, left shoulder and right shoulder. Her pain is at a severity of 6/10. Associated symptoms include fatigue.  Thyroid Problem Presents for follow-up visit. Symptoms include anxiety, constipation, dry skin and fatigue. The symptoms have been stable. Her past medical history is significant for hyperlipidemia.      Review of Systems  Constitutional: Positive for fatigue. Negative for malaise/fatigue.  Eyes: Negative for blurred vision.  Respiratory: Positive for cough and shortness of breath.   Gastrointestinal: Positive for constipation and vomiting.  Genitourinary: Positive for hesitancy and urgency. Negative for hematuria.  Musculoskeletal: Positive for arthritis.  Psychiatric/Behavioral: The patient is nervous/anxious.   All other systems reviewed and are negative.      Objective:   Physical Exam Vitals reviewed.  Constitutional:      General: She is not in acute distress.    Appearance: She is well-developed.  HENT:     Head: Normocephalic and atraumatic.     Right Ear: Tympanic membrane normal.     Left Ear: Tympanic membrane normal.  Eyes:     Pupils: Pupils are equal, round, and reactive to light.  Neck:     Thyroid: No thyromegaly.  Cardiovascular:     Rate and Rhythm: Normal rate and regular rhythm.     Heart sounds: Normal heart  sounds. No murmur heard.   Pulmonary:     Effort: Pulmonary effort is normal. No respiratory distress.     Breath sounds: Normal breath sounds. No wheezing.  Abdominal:     General: Bowel sounds are normal. There is no distension.     Palpations: Abdomen is soft.     Tenderness: There is no abdominal tenderness.  Musculoskeletal:        General: No tenderness. Normal range of motion.     Cervical back: Normal range of motion and neck supple.  Skin:    General: Skin is warm and dry.  Neurological:     Mental Status: She is alert and oriented to person, place, and time.     Cranial Nerves: No cranial nerve deficit.     Motor: Weakness present.     Deep Tendon Reflexes: Reflexes are normal and symmetric.  Psychiatric:        Behavior: Behavior normal.        Thought Content: Thought content normal.        Judgment: Judgment normal.       BP 120/75   Pulse 96   Temp (!) 97.3 F (36.3 C) (Temporal)   Ht _0  (1.626 m)   Wt 193 lb 3.2 oz (87.6 kg)   BMI 33.16 kg/m      Assessment & Plan:  [name] comes in today with chief complaint of Diabetes and Hypothyroidism   Diagnosis and orders addressed:  1. Primary hypertension - CMP14+EGFR - CBC with Differential/Platelet  2. Mild intermittent asthma, unspecified whether complicated - DIX18+ZBMZ - CBC with Differential/Platelet  3. Type 2 diabetes mellitus with hyperglycemia, with long-term current use of insulin (HCC) - Bayer DCA Hb A1c Waived - CMP14+EGFR - CBC with Differential/Platelet  4. Hypothyroidism, unspecified type - CMP14+EGFR - CBC with Differential/Platelet - TSH  5. Primary osteoarthritis of right hip - CMP14+EGFR - CBC with Differential/Platelet  6. Moderate mitral insufficiency - CMP14+EGFR - CBC with Differential/Platelet  7. Chronic kidney disease, unspecified CKD stage - CMP14+EGFR - CBC with Differential/Platelet  8. Obesity (BMI 30-39.9) - CMP14+EGFR - CBC with  Differential/Platelet  9. Generalized anxiety disorder - CMP14+EGFR - CBC with Differential/Platelet  10. UTI symptoms - Urinalysis, Complete - Urine Culture   Labs pending Health Maintenance reviewed Diet and exercise encouraged  Follow up plan: 3 months    Evelina Dun, FNP

## 2020-11-16 NOTE — Patient Instructions (Signed)
Diabetes Mellitus and Nutrition, Adult When you have diabetes, or diabetes mellitus, it is very important to have healthy eating habits because your blood sugar (glucose) levels are greatly affected by what you eat and drink. Eating healthy foods in the right amounts, at about the same times every day, can help you:  Control your blood glucose.  Lower your risk of heart disease.  Improve your blood pressure.  Reach or maintain a healthy weight. What can affect my meal plan? Every person with diabetes is different, and each person has different needs for a meal plan. Your health care provider may recommend that you work with a dietitian to make a meal plan that is best for you. Your meal plan may vary depending on factors such as:  The calories you need.  The medicines you take.  Your weight.  Your blood glucose, blood pressure, and cholesterol levels.  Your activity level.  Other health conditions you have, such as heart or kidney disease. How do carbohydrates affect me? Carbohydrates, also called carbs, affect your blood glucose level more than any other type of food. Eating carbs naturally raises the amount of glucose in your blood. Carb counting is a method for keeping track of how many carbs you eat. Counting carbs is important to keep your blood glucose at a healthy level, especially if you use insulin or take certain oral diabetes medicines. It is important to know how many carbs you can safely have in each meal. This is different for every person. Your dietitian can help you calculate how many carbs you should have at each meal and for each snack. How does alcohol affect me? Alcohol can cause a sudden decrease in blood glucose (hypoglycemia), especially if you use insulin or take certain oral diabetes medicines. Hypoglycemia can be a life-threatening condition. Symptoms of hypoglycemia, such as sleepiness, dizziness, and confusion, are similar to symptoms of having too much  alcohol.  Do not drink alcohol if: ? Your health care provider tells you not to drink. ? You are pregnant, may be pregnant, or are planning to become pregnant.  If you drink alcohol: ? Do not drink on an empty stomach. ? Limit how much you use to:  0-1 drink a day for women.  0-2 drinks a day for men. ? Be aware of how much alcohol is in your drink. In the [place] one drink equals one 12 oz bottle of beer (355 mL), one 5 oz glass of wine (148 mL), or one 1 oz glass of hard liquor (44 mL). ? Keep yourself hydrated with water, diet soda, or unsweetened iced tea.  Keep in mind that regular soda, juice, and other mixers may contain a lot of sugar and must be counted as carbs. What are tips for following this plan? Reading food labels  Start by checking the serving size on the "Nutrition Facts" label of packaged foods and drinks. The amount of calories, carbs, fats, and other nutrients listed on the label is based on one serving of the item. Many items contain more than one serving per package.  Check the total grams (g) of carbs in one serving. You can calculate the number of servings of carbs in one serving by dividing the total carbs by 15. For example, if a food has 30 g of total carbs per serving, it would be equal to 2 servings of carbs.  Check the number of grams (g) of saturated fats and trans fats in one serving. Choose foods that have   a low amount or none of these fats.  Check the number of milligrams (mg) of salt (sodium) in one serving. Most people should limit total sodium intake to less than 2,300 mg per day.  Always check the nutrition information of foods labeled as "low-fat" or "nonfat." These foods may be higher in added sugar or refined carbs and should be avoided.  Talk to your dietitian to identify your daily goals for nutrients listed on the label. Shopping  Avoid buying canned, pre-made, or processed foods. These foods tend to be high in fat, sodium, and added  sugar.  Shop around the outside edge of the grocery store. This is where you will most often find fresh fruits and vegetables, bulk grains, fresh meats, and fresh dairy. Cooking  Use low-heat cooking methods, such as baking, instead of high-heat cooking methods like deep frying.  Cook using healthy oils, such as olive, canola, or sunflower oil.  Avoid cooking with butter, cream, or high-fat meats. Meal planning  Eat meals and snacks regularly, preferably at the same times every day. Avoid going long periods of time without eating.  Eat foods that are high in fiber, such as fresh fruits, vegetables, beans, and whole grains. Talk with your dietitian about how many servings of carbs you can eat at each meal.  Eat 4-6 oz (112-168 g) of lean protein each day, such as lean meat, chicken, fish, eggs, or tofu. One ounce (oz) of lean protein is equal to: ? 1 oz (28 g) of meat, chicken, or fish. ? 1 egg. ?  cup (62 g) of tofu.  Eat some foods each day that contain healthy fats, such as avocado, nuts, seeds, and fish.   What foods should I eat? Fruits Berries. Apples. Oranges. Peaches. Apricots. Plums. Grapes. Mango. Papaya. Pomegranate. Kiwi. Cherries. Vegetables Lettuce. Spinach. Leafy greens, including kale, chard, collard greens, and mustard greens. Beets. Cauliflower. Cabbage. Broccoli. Carrots. Green beans. Tomatoes. Peppers. Onions. Cucumbers. Brussels sprouts. Grains Whole grains, such as whole-wheat or whole-grain bread, crackers, tortillas, cereal, and pasta. Unsweetened oatmeal. Quinoa. Brown or wild rice. Meats and other proteins Seafood. Poultry without skin. Lean cuts of poultry and beef. Tofu. Nuts. Seeds. Dairy Low-fat or fat-free dairy products such as milk, yogurt, and cheese. The items listed above may not be a complete list of foods and beverages you can eat. Contact a dietitian for more information. What foods should I avoid? Fruits Fruits canned with  syrup. Vegetables Canned vegetables. Frozen vegetables with butter or cream sauce. Grains Refined white flour and flour products such as bread, pasta, snack foods, and cereals. Avoid all processed foods. Meats and other proteins Fatty cuts of meat. Poultry with skin. Breaded or fried meats. Processed meat. Avoid saturated fats. Dairy Full-fat yogurt, cheese, or milk. Beverages Sweetened drinks, such as soda or iced tea. The items listed above may not be a complete list of foods and beverages you should avoid. Contact a dietitian for more information. Questions to ask a health care provider  Do I need to meet with a diabetes educator?  Do I need to meet with a dietitian?  What number can I call if I have questions?  When are the best times to check my blood glucose? Where to find more information:  American Diabetes Association: diabetes.org  Academy of Nutrition and Dietetics: www.eatright.org  National Institute of Diabetes and Digestive and Kidney Diseases: www.niddk.nih.gov  Association of Diabetes Care and Education Specialists: www.diabeteseducator.org Summary  It is important to have healthy eating   habits because your blood sugar (glucose) levels are greatly affected by what you eat and drink.  A healthy meal plan will help you control your blood glucose and maintain a healthy lifestyle.  Your health care provider may recommend that you work with a dietitian to make a meal plan that is best for you.  Keep in mind that carbohydrates (carbs) and alcohol have immediate effects on your blood glucose levels. It is important to count carbs and to use alcohol carefully. This information is not intended to replace advice given to you by your health care provider. Make sure you discuss any questions you have with your health care provider. Document Revised: 05/06/2019 Document Reviewed: 05/06/2019 [employer] Patient Education  2021 [employer]  

## 2020-11-17 LAB — CBC WITH DIFFERENTIAL/PLATELET
Basophils Absolute: 0 10*3/uL (ref 0.0–0.2)
Basos: 0 %
EOS (ABSOLUTE): 0.2 10*3/uL (ref 0.0–0.4)
Eos: 3 %
Hematocrit: 39.1 % (ref 34.0–46.6)
Hemoglobin: 12.7 g/dL (ref 11.1–15.9)
Immature Grans (Abs): 0 10*3/uL (ref 0.0–0.1)
Immature Granulocytes: 0 %
Lymphocytes Absolute: 2.4 10*3/uL (ref 0.7–3.1)
Lymphs: 30 %
MCH: 24.5 pg — ABNORMAL LOW (ref 26.6–33.0)
MCHC: 32.5 g/dL (ref 31.5–35.7)
MCV: 75 fL — ABNORMAL LOW (ref 79–97)
Monocytes Absolute: 0.4 10*3/uL (ref 0.1–0.9)
Monocytes: 6 %
Neutrophils Absolute: 4.8 10*3/uL (ref 1.4–7.0)
Neutrophils: 61 %
Platelets: 262 10*3/uL (ref 150–450)
RBC: 5.19 x10E6/uL (ref 3.77–5.28)
RDW: 13.3 % (ref 11.7–15.4)
WBC: 7.9 10*3/uL (ref 3.4–10.8)

## 2020-11-17 LAB — TSH: TSH: 0.482 u[IU]/mL (ref 0.450–4.500)

## 2020-11-17 LAB — CMP14+EGFR
ALT: 13 IU/L (ref 0–32)
AST: 16 IU/L (ref 0–40)
Albumin/Globulin Ratio: 1.6 (ref 1.2–2.2)
Albumin: 4.3 g/dL (ref 3.8–4.8)
Alkaline Phosphatase: 212 IU/L — ABNORMAL HIGH (ref 44–121)
BUN/Creatinine Ratio: 15 (ref 12–28)
BUN: 18 mg/dL (ref 8–27)
Bilirubin Total: 0.3 mg/dL (ref 0.0–1.2)
CO2: 25 mmol/L (ref 20–29)
Calcium: 10.7 mg/dL — ABNORMAL HIGH (ref 8.7–10.3)
Chloride: 104 mmol/L (ref 96–106)
Creatinine, Ser: 1.24 mg/dL — ABNORMAL HIGH (ref 0.57–1.00)
Globulin, Total: 2.7 g/dL (ref 1.5–4.5)
Glucose: 120 mg/dL — ABNORMAL HIGH (ref 65–99)
Potassium: 4.5 mmol/L (ref 3.5–5.2)
Sodium: 144 mmol/L (ref 134–144)
Total Protein: 7 g/dL (ref 6.0–8.5)
eGFR: 47 mL/min/{1.73_m2} — ABNORMAL LOW (ref >59)

## 2020-11-18 ENCOUNTER — Other Ambulatory Visit: Payer: Self-pay | Admitting: Family

## 2020-11-19 LAB — URINE CULTURE

## 2020-11-21 LAB — GAMMA GT: GGT: 23 IU/L (ref 0–60)

## 2020-11-21 LAB — SPECIMEN STATUS REPORT

## 2020-11-22 ENCOUNTER — Ambulatory Visit (INDEPENDENT_AMBULATORY_CARE_PROVIDER_SITE_OTHER): Payer: Medicare Other

## 2020-11-22 VITALS — Ht 64.0 in | Wt 192.0 lb

## 2020-11-22 DIAGNOSIS — Z0000 Encounter for general adult medical examination without abnormal findings: Secondary | ICD-10-CM

## 2020-11-22 NOTE — Patient Instructions (Signed)
[name] , Thank you for taking time to come for your Medicare Wellness Visit. I appreciate your ongoing commitment to your health goals. Please review the following plan we discussed and let me know if I can assist you in the future.   Screening recommendations/referrals: Colonoscopy: Done 04/29/2019 - Repeat in 10 years Mammogram: last done 2013 - patient refuses repeats Bone Density: Done 08/06/2019 - Repeat every 2 years Recommended yearly ophthalmology/optometry visit for glaucoma screening and checkup Recommended yearly dental visit for hygiene and checkup  Vaccinations: Influenza vaccine: Done 02/17/2020 - Repeat annually Pneumococcal vaccine: Done 02/04/2016 & 05/01/2018 Tdap vaccine: Done 10/27/2019 - Repeat in 10 years Shingles vaccine: Done 01/10/2019 & 03/19/2019   Covid-19: Done 09/18/2019 & 04/08/2020 [name])  Advanced directives: Advance directive discussed with you today. Even though you declined this today, please call our office should you change your mind, and we can give you the proper paperwork for you to fill out.  Conditions/risks identified: Aim for 30 minutes of exercise or brisk walking each day, drink 6-8 glasses of water and eat lots of fruits and vegetables.  Next appointment: Follow up in one year for your annual wellness visit    Preventive Care 65 Years and Older, Female Preventive care refers to lifestyle choices and visits with your health care provider that can promote health and wellness. What does preventive care include? A yearly physical exam. This is also called an annual well check. Dental exams once or twice a year. Routine eye exams. Ask your health care provider how often you should have your eyes checked. Personal lifestyle choices, including: Daily care of your teeth and gums. Regular physical activity. Eating a healthy diet. Avoiding tobacco and drug use. Limiting alcohol use. Practicing safe sex. Taking low-dose aspirin every day. Taking  vitamin and mineral supplements as recommended by your health care provider. What happens during an annual well check? The services and screenings done by your health care provider during your annual well check will depend on your age, overall health, lifestyle risk factors, and family history of disease. Counseling  Your health care provider may ask you questions about your: Alcohol use. Tobacco use. Drug use. Emotional well-being. Home and relationship well-being. Sexual activity. Eating habits. History of falls. Memory and ability to understand (cognition). Work and work Statistician. Reproductive health. Screening  You may have the following tests or measurements: Height, weight, and BMI. Blood pressure. Lipid and cholesterol levels. These may be checked every 5 years, or more frequently if you are over 77 years old. Skin check. Lung cancer screening. You may have this screening every year starting at age 60 if you have a 30-pack-year history of smoking and currently smoke or have quit within the past 15 years. Fecal occult blood test (FOBT) of the stool. You may have this test every year starting at age 57. Flexible sigmoidoscopy or colonoscopy. You may have a sigmoidoscopy every 5 years or a colonoscopy every 10 years starting at age 61. Hepatitis C blood test. Hepatitis B blood test. Sexually transmitted disease (STD) testing. Diabetes screening. This is done by checking your blood sugar (glucose) after you have not eaten for a while (fasting). You may have this done every 1-3 years. Bone density scan. This is done to screen for osteoporosis. You may have this done starting at age 84. Mammogram. This may be done every 1-2 years. Talk to your health care provider about how often you should have regular mammograms. Talk with your health care provider  about your test results, treatment options, and if necessary, the need for more tests. Vaccines  Your health care provider may  recommend certain vaccines, such as: Influenza vaccine. This is recommended every year. Tetanus, diphtheria, and acellular pertussis (Tdap, Td) vaccine. You may need a Td booster every 10 years. Zoster vaccine. You may need this after age 74. Pneumococcal 13-valent conjugate (PCV13) vaccine. One dose is recommended after age 54. Pneumococcal polysaccharide (PPSV23) vaccine. One dose is recommended after age 59. Talk to your health care provider about which screenings and vaccines you need and how often you need them. This information is not intended to replace advice given to you by your health care provider. Make sure you discuss any questions you have with your health care provider. Document Released: 06/25/2015 Document Revised: 02/16/2016 Document Reviewed: 03/30/2015 Elsevier Interactive Patient Education  2017 [employer] Prevention in the Home Falls can cause injuries. They can happen to people of all ages. There are many things you can do to make your home safe and to help prevent falls. What can I do on the outside of my home? Regularly fix the edges of walkways and driveways and fix any cracks. Remove anything that might make you trip as you walk through a door, such as a raised step or threshold. Trim any bushes or trees on the path to your home. Use bright outdoor lighting. Clear any walking paths of anything that might make someone trip, such as rocks or tools. Regularly check to see if handrails are loose or broken. Make sure that both sides of any steps have handrails. Any raised decks and porches should have guardrails on the edges. Have any leaves, snow, or ice cleared regularly. Use sand or salt on walking paths during winter. Clean up any spills in your garage right away. This includes oil or grease spills. What can I do in the bathroom? Use night lights. Install grab bars by the toilet and in the tub and shower. Do not use towel bars as grab bars. Use non-skid  mats or decals in the tub or shower. If you need to sit down in the shower, use a plastic, non-slip stool. Keep the floor dry. Clean up any water that spills on the floor as soon as it happens. Remove soap buildup in the tub or shower regularly. Attach bath mats securely with double-sided non-slip rug tape. Do not have throw rugs and other things on the floor that can make you trip. What can I do in the bedroom? Use night lights. Make sure that you have a light by your bed that is easy to reach. Do not use any sheets or blankets that are too big for your bed. They should not hang down onto the floor. Have a firm chair that has side arms. You can use this for support while you get dressed. Do not have throw rugs and other things on the floor that can make you trip. What can I do in the kitchen? Clean up any spills right away. Avoid walking on wet floors. Keep items that you use a lot in easy-to-reach places. If you need to reach something above you, use a strong step stool that has a grab bar. Keep electrical cords out of the way. Do not use floor polish or wax that makes floors slippery. If you must use wax, use non-skid floor wax. Do not have throw rugs and other things on the floor that can make you trip. What can I do  with my stairs? Do not leave any items on the stairs. Make sure that there are handrails on both sides of the stairs and use them. Fix handrails that are broken or loose. Make sure that handrails are as long as the stairways. Check any carpeting to make sure that it is firmly attached to the stairs. Fix any carpet that is loose or worn. Avoid having throw rugs at the top or bottom of the stairs. If you do have throw rugs, attach them to the floor with carpet tape. Make sure that you have a light switch at the top of the stairs and the bottom of the stairs. If you do not have them, ask someone to add them for you. What else can I do to help prevent falls? Wear shoes  that: Do not have high heels. Have rubber bottoms. Are comfortable and fit you well. Are closed at the toe. Do not wear sandals. If you use a stepladder: Make sure that it is fully opened. Do not climb a closed stepladder. Make sure that both sides of the stepladder are locked into place. Ask someone to hold it for you, if possible. Clearly mark and make sure that you can see: Any grab bars or handrails. First and last steps. Where the edge of each step is. Use tools that help you move around (mobility aids) if they are needed. These include: Canes. Walkers. Scooters. Crutches. Turn on the lights when you go into a dark area. Replace any light bulbs as soon as they burn out. Set up your furniture so you have a clear path. Avoid moving your furniture around. If any of your floors are uneven, fix them. If there are any pets around you, be aware of where they are. Review your medicines with your doctor. Some medicines can make you feel dizzy. This can increase your chance of falling. Ask your doctor what other things that you can do to help prevent falls. This information is not intended to replace advice given to you by your health care provider. Make sure you discuss any questions you have with your health care provider. Document Released: 03/25/2009 Document Revised: 11/04/2015 Document Reviewed: 07/03/2014 Elsevier Interactive Patient Education  2017 [employer].  Diabetes Mellitus and Nutrition, Adult When you have diabetes, or diabetes mellitus, it is very important to have healthy eating habits because your blood sugar (glucose) levels are greatly affected by what you eat and drink. Eating healthy foods in the right amounts, at about the same times every day, can help you: Control your blood glucose. Lower your risk of heart disease. Improve your blood pressure. Reach or maintain a healthy weight. What can affect my meal plan? Every person with diabetes is different, and each  person has different needs for a meal plan. Your health care provider may recommend that you work with a dietitian to make a meal plan that is best for you. Your meal plan may vary depending on factors such as: The calories you need. The medicines you take. Your weight. Your blood glucose, blood pressure, and cholesterol levels. Your activity level. Other health conditions you have, such as heart or kidney disease. How do carbohydrates affect me? Carbohydrates, also called carbs, affect your blood glucose level more than any other type of food. Eating carbs naturally raises the amount of glucose in your blood. Carb counting is a method for keeping track of how many carbs you eat. Counting carbs is important to keep your blood glucose at a healthy  level,especially if you use insulin or take certain oral diabetes medicines. It is important to know how many carbs you can safely have in each meal. This is different for every person. Your dietitian can help you calculate how manycarbs you should have at each meal and for each snack. How does alcohol affect me? Alcohol can cause a sudden decrease in blood glucose (hypoglycemia), especially if you use insulin or take certain oral diabetes medicines. Hypoglycemia can be a life-threatening condition. Symptoms of hypoglycemia, such as sleepiness, dizziness, and confusion, are similar to symptoms of having too much alcohol. Do not drink alcohol if: Your health care provider tells you not to drink. You are pregnant, may be pregnant, or are planning to become pregnant. If you drink alcohol: Do not drink on an empty stomach. Limit how much you use to: 0-1 drink a day for women. 0-2 drinks a day for men. Be aware of how much alcohol is in your drink. In the [place] one drink equals one 12 oz bottle of beer (355 mL), one 5 oz glass of wine (148 mL), or one 1 oz glass of hard liquor (44 mL). Keep yourself hydrated with water, diet soda, or unsweetened iced  tea. Keep in mind that regular soda, juice, and other mixers may contain a lot of sugar and must be counted as carbs. What are tips for following this plan?  Reading food labels Start by checking the serving size on the "Nutrition Facts" label of packaged foods and drinks. The amount of calories, carbs, fats, and other nutrients listed on the label is based on one serving of the item. Many items contain more than one serving per package. Check the total grams (g) of carbs in one serving. You can calculate the number of servings of carbs in one serving by dividing the total carbs by 15. For example, if a food has 30 g of total carbs per serving, it would be equal to 2 servings of carbs. Check the number of grams (g) of saturated fats and trans fats in one serving. Choose foods that have a low amount or none of these fats. Check the number of milligrams (mg) of salt (sodium) in one serving. Most people should limit total sodium intake to less than 2,300 mg per day. Always check the nutrition information of foods labeled as "low-fat" or "nonfat." These foods may be higher in added sugar or refined carbs and should be avoided. Talk to your dietitian to identify your daily goals for nutrients listed on the label. Shopping Avoid buying canned, pre-made, or processed foods. These foods tend to be high in fat, sodium, and added sugar. Shop around the outside edge of the grocery store. This is where you will most often find fresh fruits and vegetables, bulk grains, fresh meats, and fresh dairy. Cooking Use low-heat cooking methods, such as baking, instead of high-heat cooking methods like deep frying. Cook using healthy oils, such as olive, canola, or sunflower oil. Avoid cooking with butter, cream, or high-fat meats. Meal planning Eat meals and snacks regularly, preferably at the same times every day. Avoid going long periods of time without eating. Eat foods that are high in fiber, such as fresh fruits,  vegetables, beans, and whole grains. Talk with your dietitian about how many servings of carbs you can eat at each meal. Eat 4-6 oz (112-168 g) of lean protein each day, such as lean meat, chicken, fish, eggs, or tofu. One ounce (oz) of lean protein is equal to:  1 oz (28 g) of meat, chicken, or fish. 1 egg.  cup (62 g) of tofu. Eat some foods each day that contain healthy fats, such as avocado, nuts, seeds, and fish. What foods should I eat? Fruits Berries. Apples. Oranges. Peaches. Apricots. Plums. Grapes. Mango. Papaya.Pomegranate. Kiwi. Cherries. Vegetables Lettuce. Spinach. Leafy greens, including kale, chard, collard greens, and mustard greens. Beets. Cauliflower. Cabbage. Broccoli. Carrots. Green beans.Tomatoes. Peppers. Onions. Cucumbers. Brussels sprouts. Grains Whole grains, such as whole-wheat or whole-grain bread, crackers, tortillas,cereal, and pasta. Unsweetened oatmeal. Quinoa. Brown or wild rice. Meats and other proteins Seafood. Poultry without skin. Lean cuts of poultry and beef. Tofu. Nuts. Seeds. Dairy Low-fat or fat-free dairy products such as milk, yogurt, and cheese. The items listed above may not be a complete list of foods and beverages you can eat. Contact a dietitian for more information. What foods should I avoid? Fruits Fruits canned with syrup. Vegetables Canned vegetables. Frozen vegetables with butter or cream sauce. Grains Refined white flour and flour products such as bread, pasta, snack foods, andcereals. Avoid all processed foods. Meats and other proteins Fatty cuts of meat. Poultry with skin. Breaded or fried meats. Processed meat.Avoid saturated fats. Dairy Full-fat yogurt, cheese, or milk. Beverages Sweetened drinks, such as soda or iced tea. The items listed above may not be a complete list of foods and beverages you should avoid. Contact a dietitian for more information. Questions to ask a health care provider Do I need to meet with a diabetes  educator? Do I need to meet with a dietitian? What number can I call if I have questions? When are the best times to check my blood glucose? Where to find more information: American Diabetes Association: diabetes.org Academy of Nutrition and Dietetics: www.eatright.[employer] of Diabetes and Digestive and Kidney Diseases: [employer] Association of Diabetes Care and Education Specialists: www.diabeteseducator.org Summary It is important to have healthy eating habits because your blood sugar (glucose) levels are greatly affected by what you eat and drink. A healthy meal plan will help you control your blood glucose and maintain a healthy lifestyle. Your health care provider may recommend that you work with a dietitian to make a meal plan that is best for you. Keep in mind that carbohydrates (carbs) and alcohol have immediate effects on your blood glucose levels. It is important to count carbs and to use alcohol carefully. This information is not intended to replace advice given to you by your health care provider. Make sure you discuss any questions you have with your healthcare provider. Document Revised: 05/06/2019 Document Reviewed: 05/06/2019 Elsevier Patient Education  2021 [employer].

## 2020-11-22 NOTE — Progress Notes (Signed)
Subjective:   [name] is a 70 y.o. female who presents for Medicare Annual (Subsequent) preventive examination.  Virtual Visit via Telephone Note  I connected with  [name] on 11/22/20 at  1:15 PM EDT by telephone and verified that I am speaking with the correct person using two identifiers.  Location: Patient: Home Provider: [facility] Persons participating in the virtual visit: patient/Nurse Health Advisor   I discussed the limitations, risks, security and privacy concerns of performing an evaluation and management service by telephone and the availability of in person appointments. The patient expressed understanding and agreed to proceed.  Interactive audio and video telecommunications were attempted between this nurse and patient, however failed, due to patient having technical difficulties OR patient did not have access to video capability.  We continued and completed visit with audio only.  Some vital signs may be absent or patient reported.   [name] E Cande Mastropietro, LPN   Review of Systems     Cardiac Risk Factors include: advanced age (>94men, >66 women);diabetes mellitus;dyslipidemia;hypertension;obesity (BMI >30kg/m2);sedentary lifestyle     Objective:    Today's Vitals   11/22/20 1323  Weight: 192 lb (87.1 kg)  Height: 5\' 4"  (1.626 m)   Body mass index is 32.96 kg/m.  Advanced Directives 11/22/2020 11/20/2019 04/29/2019 04/25/2019 11/19/2018 04/02/2018 01/21/2018  Does Patient Have a Medical Advance Directive? No No No No No No Yes  Type of Advance Directive - - - - - - (No Data)  Does patient want to make changes to medical advance directive? - - - - - - No - Patient declined  Copy of [place] in Chart? - - - - - - -  Would patient like information on creating a medical advance directive? No - Patient declined No - Patient declined No - Patient declined Yes (MAU/Ambulatory/Procedural Areas - Information given) No - Patient declined No - Patient  declined -    Current Medications (verified) Outpatient Encounter Medications as of 11/22/2020  Medication Sig   Semaglutide (RYBELSUS) 3 MG TABS Take by mouth.   acetaminophen (TYLENOL) 500 MG tablet Take 1,000 mg by mouth every 8 (eight) hours as needed (pain).    albuterol (VENTOLIN HFA) 108 (90 Base) MCG/ACT inhaler TAKE 2 PUFFS BY MOUTH EVERY 6 HOURS AS NEEDED FOR WHEEZE OR SHORTNESS OF BREATH   atorvastatin (LIPITOR) 20 MG tablet TAKE 1 TABLET BY MOUTH EVERY DAY   BD INSULIN SYRINGE U/F 31G X 5/16" 0.5 ML MISC USE TO INJECT LANTUS AND NOVOLOG DAILY   BREO ELLIPTA 200-25 MCG/INH AEPB INHALE 1 PUFF INTO THE LUNGS DAILY   busPIRone (BUSPAR) 5 MG tablet TAKE 1-2 TABLETS (5-10 MG TOTAL) BY MOUTH 2 (TWO) TIMES DAILY.   cetirizine (ZYRTEC) 10 MG tablet Take 20 mg by mouth daily.   cloNIDine (CATAPRES) 0.1 MG tablet TAKE 1 TABLET (0.1 MG TOTAL) BY MOUTH DAILY.   Continuous Blood Gluc Sensor (FREESTYLE LIBRE 2 SENSOR) MISC by Does not apply route.   docusate sodium (COLACE) 100 MG capsule Take 100 mg by mouth 2 (two) times daily.   esomeprazole (NEXIUM) 40 MG capsule TAKE 1 CAPSULE BY MOUTH EVERY DAY   fluconazole (DIFLUCAN) 150 MG tablet Take 1 tablet (150 mg total) by mouth every three (3) days as needed.   fluticasone (FLONASE) 50 MCG/ACT nasal spray Place 1 spray into both nostrils 2 (two) times daily as needed for allergies or rhinitis.   furosemide (LASIX) 40 MG tablet Take 20 mg by  mouth daily.   gabapentin (NEURONTIN) 100 MG capsule Take 1 capsule (100 mg total) by mouth 3 (three) times daily.   glucose blood (ONE TOUCH ULTRA TEST) test strip TEST TWICE A DAY   HUMALOG 100 UNIT/ML injection INJECT 0.04-0.1 MLS (4-10 UNITS TOTAL) INTO THE SKIN 3 (THREE) TIMES DAILY WITH MEALS. SLIDING SCALE   hydrOXYzine (ATARAX/VISTARIL) 25 MG tablet Take 1 tablet (25 mg total) by mouth 3 (three) times daily.   insulin glargine (LANTUS) 100 UNIT/ML injection INJECT 30-40 INTO THE SKIN DAILY.    Iron-Vitamin C (VITRON-C) 65-125 MG TABS Take 1 tablet by mouth daily.   levothyroxine (SYNTHROID) 88 MCG tablet Take 1 tablet (88 mcg total) by mouth daily.   Magnesium 400 MG TABS Take 400 mg by mouth daily.   magnesium hydroxide (MILK OF MAGNESIA) 400 MG/5ML suspension Take 60 mLs by mouth See admin instructions. Every 3 days - [name]   Menthol, Topical Analgesic, (BIOFREEZE COLORLESS) 4 % GEL Apply 1 application topically daily as needed (pain). Foot, Legs and Knees   metFORMIN (GLUCOPHAGE XR) 750 MG 24 hr tablet Take 1 tablet (750 mg total) by mouth daily with breakfast.   metoprolol succinate (TOPROL-XL) 25 MG 24 hr tablet Take 1 tablet (25 mg total) by mouth daily.   montelukast (SINGULAIR) 10 MG tablet TAKE 1 TABLET BY MOUTH EVERY EVENING   Multiple Vitamins-Minerals (MULTIVITAMIN WITH MINERALS) tablet Take 1 tablet by mouth daily. Centrum Silver   Propylene Glycol (SYSTANE BALANCE OP) Place 1 drop into both eyes daily as needed (Dry eyes).   quinapril (ACCUPRIL) 5 MG tablet TAKE 2 TABLETS BY MOUTH EVERY DAY   Vitamin D, Ergocalciferol, (DRISDOL) 1.25 MG (50000 UNIT) CAPS capsule TAKE 1 CAPSULE BY MOUTH  EACH WEEK   No facility-administered encounter medications on file as of 11/22/2020.    Allergies (verified) Other, Aspirin, Jardiance [empagliflozin], Allopurinol, Penicillins, and Sulfa antibiotics   History: Past Medical History:  Diagnosis Date   Allergic rhinitis    Allergy    Anemia    Ankle swelling    Anxiety    takes Xanax daily    Arthritis    Asthma    Blurred vision, bilateral    Chronic kidney disease    Depression    takes Celexa daily   Diabetes mellitus without complication ([name])    type II; takes Actos daily   GERD (gastroesophageal reflux disease)    Gout    Headache    Heart murmur    Heart palpitations    Hemorrhoids    History of blood transfusion    no abnormal reaction noted   History of bronchitis    Hyperlipidemia    Hypertension     takes Catapres and Quinapril daily   Hypothyroidism    takes Synthroid daily   Moderate mitral insufficiency    a. 12/2014 Echo: EF 55-60%, mod MR, mildly to moderately dil LA, PASP 54mmHg.   Myocardial infarction [facility])    " Minor"   Osteoarthritis    Paroxysmal supraventricular tachycardia ([place])    Recurrent ventral incisional hernia    Ringing in ears    Shortness of breath dyspnea    ALbuterol daily as needed   Stevens-Johnson disease ([place])    Urinary incontinence    Wears glasses    Past Surgical History:  Procedure Laterality Date   ABDOMINAL HYSTERECTOMY     BOWEL RESECTION     COLON SURGERY     COLONOSCOPY     COLONOSCOPY  WITH PROPOFOL N/A 04/29/2019   Procedure: COLONOSCOPY WITH PROPOFOL;  Surgeon: Danie Binder, MD;  [facility];  Service: Endoscopy;  Laterality: N/A;  7:30am   ESOPHAGOGASTRODUODENOSCOPY     HEMORRHOID SURGERY  1998   HERNIA REPAIR     hiatal hernia repair   HIATAL HERNIA REPAIR  1980   pt is unsure of exact date but thinks it was early 28's   [place] N/A 04/02/2018   Procedure: OPEN VENTRAL HERNIA REPAIR WITH MESH, Explantation of hernia mesh;  Surgeon: Donnie Mesa, MD;  Location: [place];  Service: General;  Laterality: N/A;   INSERTION OF MESH N/A 04/02/2018   Procedure: INSERTION OF MESH;  Surgeon: Donnie Mesa, MD;  Location: [place];  Service: General;  Laterality: N/A;   POLYPECTOMY  04/29/2019   Procedure: POLYPECTOMY;  Surgeon: Danie Binder, MD;  [facility];  Service: Endoscopy;;   TOTAL HIP ARTHROPLASTY Left 02/02/2015   Procedure: LEFT TOTAL HIP ARTHROPLASTY;  Surgeon: Garald Balding, MD;  Location: [place];  Service: Orthopedics;  Laterality: Left;   TOTAL HIP ARTHROPLASTY Right 02/01/2016   Procedure: TOTAL HIP ARTHROPLASTY;  Surgeon: Garald Balding, MD;  Location: [place];  Service: Orthopedics;  Laterality: Right;   Family History  Problem Relation Age of Onset   Heart attack Mother     Kidney disease Mother    Diabetes Mother    Hypertension Mother    Colon polyps Mother    Cancer Father        Widespread; unknown primary   COPD Father    Hypertension Sister    Hyperlipidemia Sister    Colon cancer Maternal Aunt    Colon cancer Paternal Aunt    Social History   Socioeconomic History   Marital status: Single    Spouse name: Not on file   Number of children: 0   Years of education: 12   Highest education level: High school graduate  Occupational History   Occupation: retired  Tobacco Use   Smoking status: Never   Smokeless tobacco: Never  Scientific laboratory technician Use: Never used  Substance and Sexual Activity   Alcohol use: No    Alcohol/week: 0.0 standard drinks   Drug use: No   Sexual activity: Not Currently    Birth control/protection: Surgical  Other Topics Concern   Not on file  Social History Narrative   Lives in single level home with her sister.    Social Determinants of Health   Financial Resource Strain: Low Risk    Difficulty of Paying Living Expenses: Not hard at all  Food Insecurity: No Food Insecurity   Worried About Charity fundraiser in the Last Year: Never true   [place] in the Last Year: Never true  Transportation Needs: No Transportation Needs   Lack of Transportation (Medical): No   Lack of Transportation (Non-Medical): No  Physical Activity: Inactive   Days of Exercise per Week: 0 days   Minutes of Exercise per Session: 0 min  Stress: No Stress Concern Present   Feeling of Stress : Not at all  Social Connections: Moderately Integrated   Frequency of Communication with Friends and Family: More than three times a week   Frequency of Social Gatherings with Friends and Family: More than three times a week   Attends Religious Services: More than 4 times per year   Active Member of Clubs or Organizations: Yes   Attends  Club or Organization Meetings: 1 to 4 times per year   Marital Status: Never married    Tobacco  Counseling Counseling given: Not Answered   Clinical Intake:  Pre-visit preparation completed: Yes  Pain : No/denies pain     BMI - recorded: 32.96 Nutritional Status: BMI > 30  Obese Nutritional Risks: None Diabetes: Yes CBG done?: No Did pt. bring in CBG monitor from home?: No  How often do you need to have someone help you when you read instructions, pamphlets, or other written materials from your doctor or pharmacy?: 1 - Never  Nutrition Risk Assessment:  Has the patient had any N/V/D within the last 2 months?  No  Does the patient have any non-healing wounds?  No  Has the patient had any unintentional weight loss or weight gain?  No   Diabetes:  Is the patient diabetic?  Yes  If diabetic, was a CBG obtained today?  No  Did the patient bring in their glucometer from home?  No  How often do you monitor your CBG's? Once or twice per week.   Financial Strains and Diabetes Management:  Are you having any financial strains with the device, your supplies or your medication? No .  Does the patient want to be seen by Chronic Care Management for management of their diabetes?  No  Would the patient like to be referred to a Nutritionist or for Diabetic Management?  No   Diabetic Exams:  Diabetic Eye Exam: Completed 08/03/2020.   Diabetic Foot Exam: Completed 10/27/2019. Pt has been advised about the importance in completing this exam. Pt is scheduled for diabetic foot exam on 02/2021.    Interpreter Needed?: No  Information entered by :: Debbie Yearick, LPN   Activities of Daily Living In your present state of health, do you have any difficulty performing the following activities: 11/22/2020  Hearing? N  Vision? N  Difficulty concentrating or making decisions? N  Walking or climbing stairs? N  Dressing or bathing? N  Doing errands, shopping? N  Preparing Food and eating ? N  Using the Toilet? N  In the past six months, have you accidently leaked urine? N  Do you have  problems with loss of bowel control? N  Managing your Medications? N  Managing your Finances? N  Housekeeping or managing your Housekeeping? N  Some recent data might be hidden    Patient Care Team: Sharion Balloon, FNP as PCP - General (Family Medicine) Garald Balding, MD as Consulting Physician (Orthopedic Surgery) Danie Binder, MD (Inactive) as Consulting Physician (Gastroenterology) [facility] (Ophthalmology)  Indicate any recent Medical Services you may have received from other than [facility] providers in the past year (date may be approximate).     Assessment:   This is a routine wellness examination for [name].  Hearing/Vision screen Hearing Screening - Comments:: Denies hearing difficulties  Vision Screening - Comments:: Wears glasses - up to date with annual eye exam - last saw Dr Ernestine Mcmurray @ [facility], but may see someone in [place] next year.  Dietary issues and exercise activities discussed: Current Exercise Habits: The patient does not participate in regular exercise at present, Exercise limited by: respiratory conditions(s)   Goals Addressed               This Visit's Progress     Patient Stated (pt-stated)   On track     "getting back with God and worshiping him after this virus"  Patient Stated   Not on track     11/20/2019 AWV Goal: Exercise for General Health  Patient will verbalize understanding of the benefits of increased physical activity: Exercising regularly is important. It will improve your overall fitness, flexibility, and endurance. Regular exercise also will improve your overall health. It can help you control your weight, reduce stress, and improve your bone density. Over the next year, patient will increase physical activity as tolerated with a goal of at least 150 minutes of moderate physical activity per week.  You can tell that you are exercising at a moderate intensity if your heart starts beating faster and you  start breathing faster but can still hold a conversation. Moderate-intensity exercise ideas include: Walking 1 mile (1.6 km) in about 15 minutes Biking Hiking Golfing Dancing Water aerobics Patient will verbalize understanding of everyday activities that increase physical activity by providing examples like the following: Yard work, such as: Sales promotion account executive Gardening Washing windows or floors Patient will be able to explain general safety guidelines for exercising:  Before you start a new exercise program, talk with your health care provider. Do not exercise so much that you hurt yourself, feel dizzy, or get very short of breath. Wear comfortable clothes and wear shoes with good support. Drink plenty of water while you exercise to prevent dehydration or heat stroke. Work out until your breathing and your heartbeat get faster.         Depression Screen PHQ 2/9 Scores 11/22/2020 11/16/2020 11/16/2020 09/15/2020 03/02/2020 11/20/2019 10/27/2019  PHQ - 2 Score 0 0 0 0 0 2 0  PHQ- 9 Score - - - - - 10 -    Fall Risk Fall Risk  11/22/2020 11/16/2020 09/15/2020 03/02/2020 11/20/2019  Falls in the past year? 0 0 0 0 0  Number falls in past yr: 0 - - - -  Injury with Fall? 0 - - - -  Risk for fall due to : Impaired vision - - - -  Follow up Falls prevention discussed - - - -    FALL RISK PREVENTION PERTAINING TO THE HOME:  Any stairs in or around the home? Yes  If so, are there any without handrails? No  Home free of loose throw rugs in walkways, pet beds, electrical cords, etc? Yes  Adequate lighting in your home to reduce risk of falls? Yes   ASSISTIVE DEVICES UTILIZED TO PREVENT FALLS:  Life alert? No  Use of a cane, walker or w/c? No  Grab bars in the bathroom? Yes  Shower chair or bench in shower? No  Elevated toilet seat or a handicapped toilet? No   TIMED UP AND GO:  Was the test performed? No .  Telephonic visit.  Cognitive Function:     6CIT Screen 11/22/2020 11/20/2019 11/19/2018  What Year? 0 points 0 points 0 points  What month? 0 points 0 points 0 points  What time? 0 points 0 points 0 points  Count back from 20 0 points 0 points 0 points  Months in reverse 0 points 0 points 0 points  Repeat phrase 0 points 2 points 0 points  Total Score 0 2 0    Immunizations Immunization History  Administered Date(s) Administered   Fluad Quad(high Dose 65+) 02/17/2020   Influenza, High Dose Seasonal PF 04/05/2018, 02/21/2019   Influenza,inj,Quad PF,6+ Mos 04/13/2016   Janssen (J&J) SARS-COV-2 Vaccination 09/18/2019, 04/08/2020   Pneumococcal Conjugate-13  05/01/2018   Pneumococcal Polysaccharide-23 02/04/2016   Tdap 10/27/2019   Zoster Recombinat (Shingrix) 01/10/2019, 03/19/2019    TDAP status: Up to date  Flu Vaccine status: Up to date  Pneumococcal vaccine status: Up to date  Covid-19 vaccine status: Completed vaccines  Qualifies for Shingles Vaccine? Yes   Zostavax completed Yes   Shingrix Completed?: Yes  Screening Tests Health Maintenance  Topic Date Due   COVID-19 Vaccine (3 - Booster for Janssen series) 08/09/2020   FOOT EXAM  10/26/2020   MAMMOGRAM  11/16/2021 (Originally 11/23/2013)   INFLUENZA VACCINE  01/10/2021   PNA vac Low Risk Adult (2 of 2 - PPSV23) 02/03/2021   HEMOGLOBIN A1C  05/18/2021   OPHTHALMOLOGY EXAM  08/03/2021   DEXA SCAN  08/05/2021   COLONOSCOPY (Pts 45-53yrs Insurance coverage will need to be confirmed)  04/28/2029   TETANUS/TDAP  10/26/2029   Hepatitis C Screening  Completed   Zoster Vaccines- Shingrix  Completed   HPV VACCINES  Aged Out    Health Maintenance  Health Maintenance Due  Topic Date Due   COVID-19 Vaccine (3 - Booster for Janssen series) 08/09/2020   FOOT EXAM  10/26/2020    Colorectal cancer screening: Type of screening: Colonoscopy. Completed 04/29/2019. Repeat every 10 years  Mammogram status: No longer  required due to PATIENT DECLINED.  Bone Density status: Completed 08/06/2019. Results reflect: Bone density results: OSTEOPOROSIS. Repeat every 2 years.  Lung Cancer Screening: (Low Dose CT Chest recommended if Age 51-80 years, 30 pack-year currently smoking OR have quit w/in 15years.) does not qualify.   Additional Screening:  Hepatitis C Screening: does qualify; Completed 10/27/2019  Vision Screening: Recommended annual ophthalmology exams for early detection of glaucoma and other disorders of the eye. Is the patient up to date with their annual eye exam?  Yes  Who is the provider or what is the name of the office in which the patient attends annual eye exams? [facility] If pt is not established with a provider, would they like to be referred to a provider to establish care? No .   Dental Screening: Recommended annual dental exams for proper oral hygiene  Community Resource Referral / Chronic Care Management: CRR required this visit?  No   CCM required this visit?  No      Plan:     I have personally reviewed and noted the following in the patient's chart:   Medical and social history Use of alcohol, tobacco or illicit drugs  Current medications and supplements including opioid prescriptions.  Functional ability and status Nutritional status Physical activity Advanced directives List of other physicians Hospitalizations, surgeries, and ER visits in previous 12 months Vitals Screenings to include cognitive, depression, and falls Referrals and appointments  In addition, I have reviewed and discussed with patient certain preventive protocols, quality metrics, and best practice recommendations. A written personalized care plan for preventive services as well as general preventive health recommendations were provided to patient.     Sandrea Hammond, LPN   09/18/8117   Nurse Notes: None

## 2020-11-23 ENCOUNTER — Telehealth: Payer: Self-pay | Admitting: Pharmacist

## 2020-11-23 NOTE — Telephone Encounter (Signed)
Please let patient know her Libre 2 CGM RX was sent to [facility]  They should be calling her if approved in the next few business days (phone #[phone])  Thank you!!

## 2020-11-23 NOTE — Progress Notes (Signed)
Pt aware.

## 2020-11-23 NOTE — Telephone Encounter (Signed)
Pt called and aware - she also stated that if this company will not cover - [employer] mentioned sending it to [facility].

## 2020-11-24 DIAGNOSIS — E1165 Type 2 diabetes mellitus with hyperglycemia: Secondary | ICD-10-CM | POA: Diagnosis not present

## 2020-12-03 ENCOUNTER — Other Ambulatory Visit: Payer: Self-pay | Admitting: Family

## 2020-12-03 DIAGNOSIS — E1065 Type 1 diabetes mellitus with hyperglycemia: Secondary | ICD-10-CM

## 2020-12-05 ENCOUNTER — Other Ambulatory Visit: Payer: Self-pay | Admitting: Family

## 2020-12-05 DIAGNOSIS — F411 Generalized anxiety disorder: Secondary | ICD-10-CM

## 2020-12-13 ENCOUNTER — Other Ambulatory Visit: Payer: Self-pay | Admitting: Family

## 2020-12-13 DIAGNOSIS — E1149 Type 2 diabetes mellitus with other diabetic neurological complication: Secondary | ICD-10-CM

## 2020-12-14 ENCOUNTER — Ambulatory Visit: Payer: Medicare Other | Admitting: Family

## 2020-12-25 DIAGNOSIS — E1165 Type 2 diabetes mellitus with hyperglycemia: Secondary | ICD-10-CM | POA: Diagnosis not present

## 2020-12-29 ENCOUNTER — Other Ambulatory Visit: Payer: Self-pay | Admitting: Family

## 2020-12-29 DIAGNOSIS — E1065 Type 1 diabetes mellitus with hyperglycemia: Secondary | ICD-10-CM

## 2021-01-03 ENCOUNTER — Other Ambulatory Visit: Payer: Self-pay | Admitting: Family

## 2021-01-12 ENCOUNTER — Other Ambulatory Visit: Payer: Self-pay | Admitting: Family

## 2021-01-12 DIAGNOSIS — I10 Essential (primary) hypertension: Secondary | ICD-10-CM

## 2021-01-21 ENCOUNTER — Other Ambulatory Visit: Payer: Self-pay | Admitting: Family

## 2021-01-21 DIAGNOSIS — E1065 Type 1 diabetes mellitus with hyperglycemia: Secondary | ICD-10-CM

## 2021-01-25 DIAGNOSIS — E1165 Type 2 diabetes mellitus with hyperglycemia: Secondary | ICD-10-CM | POA: Diagnosis not present

## 2021-01-30 ENCOUNTER — Other Ambulatory Visit: Payer: Self-pay | Admitting: Family

## 2021-01-30 DIAGNOSIS — E1065 Type 1 diabetes mellitus with hyperglycemia: Secondary | ICD-10-CM

## 2021-02-06 ENCOUNTER — Other Ambulatory Visit: Payer: Self-pay | Admitting: Family Medicine

## 2021-02-06 DIAGNOSIS — I10 Essential (primary) hypertension: Secondary | ICD-10-CM

## 2021-02-08 ENCOUNTER — Other Ambulatory Visit: Payer: Self-pay | Admitting: Family

## 2021-02-08 DIAGNOSIS — E1065 Type 1 diabetes mellitus with hyperglycemia: Secondary | ICD-10-CM

## 2021-02-17 ENCOUNTER — Ambulatory Visit (INDEPENDENT_AMBULATORY_CARE_PROVIDER_SITE_OTHER): Payer: Medicare Other | Admitting: Family

## 2021-02-17 ENCOUNTER — Other Ambulatory Visit: Payer: Self-pay

## 2021-02-17 ENCOUNTER — Encounter: Payer: Self-pay | Admitting: Family

## 2021-02-17 VITALS — BP 144/81 | HR 90 | Temp 98.0°F | Wt 190.0 lb

## 2021-02-17 DIAGNOSIS — M1611 Unilateral primary osteoarthritis, right hip: Secondary | ICD-10-CM | POA: Diagnosis not present

## 2021-02-17 DIAGNOSIS — E1165 Type 2 diabetes mellitus with hyperglycemia: Secondary | ICD-10-CM

## 2021-02-17 DIAGNOSIS — I10 Essential (primary) hypertension: Secondary | ICD-10-CM | POA: Diagnosis not present

## 2021-02-17 DIAGNOSIS — J4520 Mild intermittent asthma, uncomplicated: Secondary | ICD-10-CM

## 2021-02-17 DIAGNOSIS — F411 Generalized anxiety disorder: Secondary | ICD-10-CM

## 2021-02-17 DIAGNOSIS — D649 Anemia, unspecified: Secondary | ICD-10-CM | POA: Diagnosis not present

## 2021-02-17 DIAGNOSIS — Z794 Long term (current) use of insulin: Secondary | ICD-10-CM | POA: Diagnosis not present

## 2021-02-17 DIAGNOSIS — E039 Hypothyroidism, unspecified: Secondary | ICD-10-CM

## 2021-02-17 DIAGNOSIS — N189 Chronic kidney disease, unspecified: Secondary | ICD-10-CM

## 2021-02-17 DIAGNOSIS — I340 Nonrheumatic mitral (valve) insufficiency: Secondary | ICD-10-CM | POA: Diagnosis not present

## 2021-02-17 DIAGNOSIS — E559 Vitamin D deficiency, unspecified: Secondary | ICD-10-CM | POA: Diagnosis not present

## 2021-02-17 DIAGNOSIS — R6889 Other general symptoms and signs: Secondary | ICD-10-CM | POA: Diagnosis not present

## 2021-02-17 LAB — BAYER DCA HB A1C WAIVED: HB A1C (BAYER DCA - WAIVED): 6 % — ABNORMAL HIGH (ref 4.8–5.6)

## 2021-02-17 NOTE — Patient Instructions (Signed)
Diabetes Mellitus and Nutrition, Adult When you have diabetes, or diabetes mellitus, it is very important to have healthy eating habits because your blood sugar (glucose) levels are greatly affected by what you eat and drink. Eating healthy foods in the right amounts, at about the same times every day, can help you:  Control your blood glucose.  Lower your risk of heart disease.  Improve your blood pressure.  Reach or maintain a healthy weight. What can affect my meal plan? Every person with diabetes is different, and each person has different needs for a meal plan. Your health care provider may recommend that you work with a dietitian to make a meal plan that is best for you. Your meal plan may vary depending on factors such as:  The calories you need.  The medicines you take.  Your weight.  Your blood glucose, blood pressure, and cholesterol levels.  Your activity level.  Other health conditions you have, such as heart or kidney disease. How do carbohydrates affect me? Carbohydrates, also called carbs, affect your blood glucose level more than any other type of food. Eating carbs naturally raises the amount of glucose in your blood. Carb counting is a method for keeping track of how many carbs you eat. Counting carbs is important to keep your blood glucose at a healthy level, especially if you use insulin or take certain oral diabetes medicines. It is important to know how many carbs you can safely have in each meal. This is different for every person. Your dietitian can help you calculate how many carbs you should have at each meal and for each snack. How does alcohol affect me? Alcohol can cause a sudden decrease in blood glucose (hypoglycemia), especially if you use insulin or take certain oral diabetes medicines. Hypoglycemia can be a life-threatening condition. Symptoms of hypoglycemia, such as sleepiness, dizziness, and confusion, are similar to symptoms of having too much  alcohol.  Do not drink alcohol if: ? Your health care provider tells you not to drink. ? You are pregnant, may be pregnant, or are planning to become pregnant.  If you drink alcohol: ? Do not drink on an empty stomach. ? Limit how much you use to:  0-1 drink a day for women.  0-2 drinks a day for men. ? Be aware of how much alcohol is in your drink. In the [place] one drink equals one 12 oz bottle of beer (355 mL), one 5 oz glass of wine (148 mL), or one 1 oz glass of hard liquor (44 mL). ? Keep yourself hydrated with water, diet soda, or unsweetened iced tea.  Keep in mind that regular soda, juice, and other mixers may contain a lot of sugar and must be counted as carbs. What are tips for following this plan? Reading food labels  Start by checking the serving size on the "Nutrition Facts" label of packaged foods and drinks. The amount of calories, carbs, fats, and other nutrients listed on the label is based on one serving of the item. Many items contain more than one serving per package.  Check the total grams (g) of carbs in one serving. You can calculate the number of servings of carbs in one serving by dividing the total carbs by 15. For example, if a food has 30 g of total carbs per serving, it would be equal to 2 servings of carbs.  Check the number of grams (g) of saturated fats and trans fats in one serving. Choose foods that have   a low amount or none of these fats.  Check the number of milligrams (mg) of salt (sodium) in one serving. Most people should limit total sodium intake to less than 2,300 mg per day.  Always check the nutrition information of foods labeled as "low-fat" or "nonfat." These foods may be higher in added sugar or refined carbs and should be avoided.  Talk to your dietitian to identify your daily goals for nutrients listed on the label. Shopping  Avoid buying canned, pre-made, or processed foods. These foods tend to be high in fat, sodium, and added  sugar.  Shop around the outside edge of the grocery store. This is where you will most often find fresh fruits and vegetables, bulk grains, fresh meats, and fresh dairy. Cooking  Use low-heat cooking methods, such as baking, instead of high-heat cooking methods like deep frying.  Cook using healthy oils, such as olive, canola, or sunflower oil.  Avoid cooking with butter, cream, or high-fat meats. Meal planning  Eat meals and snacks regularly, preferably at the same times every day. Avoid going long periods of time without eating.  Eat foods that are high in fiber, such as fresh fruits, vegetables, beans, and whole grains. Talk with your dietitian about how many servings of carbs you can eat at each meal.  Eat 4-6 oz (112-168 g) of lean protein each day, such as lean meat, chicken, fish, eggs, or tofu. One ounce (oz) of lean protein is equal to: ? 1 oz (28 g) of meat, chicken, or fish. ? 1 egg. ?  cup (62 g) of tofu.  Eat some foods each day that contain healthy fats, such as avocado, nuts, seeds, and fish.   What foods should I eat? Fruits Berries. Apples. Oranges. Peaches. Apricots. Plums. Grapes. Mango. Papaya. Pomegranate. Kiwi. Cherries. Vegetables Lettuce. Spinach. Leafy greens, including kale, chard, collard greens, and mustard greens. Beets. Cauliflower. Cabbage. Broccoli. Carrots. Green beans. Tomatoes. Peppers. Onions. Cucumbers. Brussels sprouts. Grains Whole grains, such as whole-wheat or whole-grain bread, crackers, tortillas, cereal, and pasta. Unsweetened oatmeal. Quinoa. Brown or wild rice. Meats and other proteins Seafood. Poultry without skin. Lean cuts of poultry and beef. Tofu. Nuts. Seeds. Dairy Low-fat or fat-free dairy products such as milk, yogurt, and cheese. The items listed above may not be a complete list of foods and beverages you can eat. Contact a dietitian for more information. What foods should I avoid? Fruits Fruits canned with  syrup. Vegetables Canned vegetables. Frozen vegetables with butter or cream sauce. Grains Refined white flour and flour products such as bread, pasta, snack foods, and cereals. Avoid all processed foods. Meats and other proteins Fatty cuts of meat. Poultry with skin. Breaded or fried meats. Processed meat. Avoid saturated fats. Dairy Full-fat yogurt, cheese, or milk. Beverages Sweetened drinks, such as soda or iced tea. The items listed above may not be a complete list of foods and beverages you should avoid. Contact a dietitian for more information. Questions to ask a health care provider  Do I need to meet with a diabetes educator?  Do I need to meet with a dietitian?  What number can I call if I have questions?  When are the best times to check my blood glucose? Where to find more information:  American Diabetes Association: diabetes.org  Academy of Nutrition and Dietetics: www.eatright.org  National Institute of Diabetes and Digestive and Kidney Diseases: www.niddk.nih.gov  Association of Diabetes Care and Education Specialists: www.diabeteseducator.org Summary  It is important to have healthy eating   habits because your blood sugar (glucose) levels are greatly affected by what you eat and drink.  A healthy meal plan will help you control your blood glucose and maintain a healthy lifestyle.  Your health care provider may recommend that you work with a dietitian to make a meal plan that is best for you.  Keep in mind that carbohydrates (carbs) and alcohol have immediate effects on your blood glucose levels. It is important to count carbs and to use alcohol carefully. This information is not intended to replace advice given to you by your health care provider. Make sure you discuss any questions you have with your health care provider. Document Revised: 05/06/2019 Document Reviewed: 05/06/2019 [employer] Patient Education  2021 [employer]  

## 2021-02-17 NOTE — Progress Notes (Signed)
Subjective:    Patient ID: [name], female    DOB: Aug 25, 1950, 70 y.o.   MRN: [id number]  No chief complaint on file.  Pt calls the office today for chronic follow up. She is followed Cardiologists annually for mitral insufficiency. Diabetes She presents for her follow-up diabetic visit. She has type 2 diabetes mellitus. There are no hypoglycemic associated symptoms. Associated symptoms include blurred vision and foot paresthesias. Pertinent negatives for diabetes include no fatigue. Symptoms are stable. Diabetic complications include heart disease and nephropathy. Pertinent negatives for diabetic complications include no peripheral neuropathy. Risk factors for coronary artery disease include dyslipidemia, diabetes mellitus, hypertension, sedentary lifestyle and post-menopausal. She is following a generally healthy diet. Her overall blood glucose range is 110-130 mg/dl. An ACE inhibitor/angiotensin II receptor blocker is being taken.  Hypertension This is a chronic problem. The current episode started more than 1 year ago. The problem has been resolved since onset. Associated symptoms include blurred vision and malaise/fatigue. Pertinent negatives include no peripheral edema or shortness of breath. Risk factors for coronary artery disease include dyslipidemia, diabetes mellitus, obesity and sedentary lifestyle. The current treatment provides moderate improvement. Identifiable causes of hypertension include a thyroid problem.  Thyroid Problem Presents for follow-up visit. Symptoms include dry skin. Patient reports no constipation, fatigue or hoarse voice. The symptoms have been stable.  Anemia Presents for follow-up visit. Symptoms include malaise/fatigue.  Arthritis Presents for follow-up visit. She complains of pain and stiffness. The symptoms have been stable. Affected locations include the left hip and right hip. Her pain is at a severity of 8/10. Pertinent negatives include no fatigue.      Review of Systems  Constitutional:  Positive for malaise/fatigue. Negative for fatigue.  HENT:  Negative for hoarse voice.   Eyes:  Positive for blurred vision.  Respiratory:  Negative for shortness of breath.   Gastrointestinal:  Negative for constipation.  Musculoskeletal:  Positive for arthritis and stiffness.  All other systems reviewed and are negative.     Objective:   Physical Exam Vitals reviewed.  Constitutional:      General: She is not in acute distress.    Appearance: She is well-developed. She is obese.  HENT:     Head: Normocephalic and atraumatic.     Right Ear: Tympanic membrane normal.     Left Ear: Tympanic membrane normal.  Eyes:     Pupils: Pupils are equal, round, and reactive to light.  Neck:     Thyroid: No thyromegaly.  Cardiovascular:     Rate and Rhythm: Normal rate and regular rhythm.     Heart sounds: Normal heart sounds. No murmur heard. Pulmonary:     Effort: Pulmonary effort is normal. No respiratory distress.     Breath sounds: Normal breath sounds. No wheezing.  Abdominal:     General: Bowel sounds are normal. There is no distension.     Palpations: Abdomen is soft.     Tenderness: There is no abdominal tenderness.  Musculoskeletal:        General: Tenderness present.     Cervical back: Normal range of motion and neck supple.     Comments:  Pain in bilateral hip with flexion and extension  Skin:    General: Skin is warm and dry.  Neurological:     Mental Status: She is alert and oriented to person, place, and time.     Cranial Nerves: No cranial nerve deficit.     Deep Tendon Reflexes: Reflexes are normal  and symmetric.  Psychiatric:        Behavior: Behavior normal.        Thought Content: Thought content normal.        Judgment: Judgment normal.       BP (!) 144/81   Pulse 90   Temp 98 F (36.7 C) (Temporal)   Wt 190 lb (86.2 kg)   BMI 32.61 kg/m   Assessment & Plan:  [name] comes in today with chief  complaint of Medical Management of Chronic Issues   Diagnosis and orders addressed:  1. Moderate mitral insufficiency  - CMP14+EGFR  2. Primary hypertension - CMP14+EGFR  3. Hypothyroidism, unspecified type - CMP14+EGFR  4. Type 2 diabetes mellitus with hyperglycemia, with long-term current use of insulin (HCC) - CMP14+EGFR - Bayer DCA Hb A1c Waived  5. Primary osteoarthritis of right hip - CMP14+EGFR  6. Chronic kidney disease, unspecified CKD stage  - CMP14+EGFR  7. Generalized anxiety disorder - CMP14+EGFR  8. Vitamin D deficiency - CMP14+EGFR  9. Mild intermittent asthma, unspecified whether complicated - LEZ74+JFTN  10. Anemia, unspecified type - Anemia Profile B - CMP14+EGFR   Labs pending Health Maintenance reviewed Diet and exercise encouraged  Follow up plan: 3 month    Evelina Dun, FNP

## 2021-02-18 LAB — CMP14+EGFR
ALT: 10 IU/L (ref 0–32)
AST: 18 IU/L (ref 0–40)
Albumin/Globulin Ratio: 1.4 (ref 1.2–2.2)
Albumin: 4 g/dL (ref 3.8–4.8)
Alkaline Phosphatase: 169 IU/L — ABNORMAL HIGH (ref 44–121)
BUN/Creatinine Ratio: 16 (ref 12–28)
BUN: 17 mg/dL (ref 8–27)
Bilirubin Total: 0.3 mg/dL (ref 0.0–1.2)
CO2: 21 mmol/L (ref 20–29)
Calcium: 9.6 mg/dL (ref 8.7–10.3)
Chloride: 106 mmol/L (ref 96–106)
Creatinine, Ser: 1.08 mg/dL — ABNORMAL HIGH (ref 0.57–1.00)
Globulin, Total: 2.8 g/dL (ref 1.5–4.5)
Glucose: 85 mg/dL (ref 65–99)
Potassium: 4.2 mmol/L (ref 3.5–5.2)
Sodium: 143 mmol/L (ref 134–144)
Total Protein: 6.8 g/dL (ref 6.0–8.5)
eGFR: 56 mL/min/{1.73_m2} — ABNORMAL LOW (ref >59)

## 2021-02-18 LAB — ANEMIA PROFILE B
Basophils Absolute: 0 10*3/uL (ref 0.0–0.2)
Basos: 0 %
EOS (ABSOLUTE): 0.2 10*3/uL (ref 0.0–0.4)
Eos: 3 %
Ferritin: 83 ng/mL (ref 15–150)
Folate: >20 ng/mL (ref >3.0)
Hematocrit: 35.4 % (ref 34.0–46.6)
Hemoglobin: 11.5 g/dL (ref 11.1–15.9)
Immature Grans (Abs): 0 10*3/uL (ref 0.0–0.1)
Immature Granulocytes: 0 %
Iron Saturation: 21 % (ref 15–55)
Iron: 61 ug/dL (ref 27–139)
Lymphocytes Absolute: 2.1 10*3/uL (ref 0.7–3.1)
Lymphs: 31 %
MCH: 24.7 pg — ABNORMAL LOW (ref 26.6–33.0)
MCHC: 32.5 g/dL (ref 31.5–35.7)
MCV: 76 fL — ABNORMAL LOW (ref 79–97)
Monocytes Absolute: 0.5 10*3/uL (ref 0.1–0.9)
Monocytes: 7 %
Neutrophils Absolute: 4 10*3/uL (ref 1.4–7.0)
Neutrophils: 59 %
Platelets: 237 10*3/uL (ref 150–450)
RBC: 4.66 x10E6/uL (ref 3.77–5.28)
RDW: 14.1 % (ref 11.7–15.4)
Retic Ct Pct: 1.7 % (ref 0.6–2.6)
Total Iron Binding Capacity: 294 ug/dL (ref 250–450)
UIBC: 233 ug/dL (ref 118–369)
Vitamin B-12: 824 pg/mL (ref 232–1245)
WBC: 6.7 10*3/uL (ref 3.4–10.8)

## 2021-02-21 ENCOUNTER — Telehealth: Payer: Self-pay | Admitting: Family

## 2021-02-21 NOTE — Telephone Encounter (Signed)
Please schedule patient an appointment with Almyra Free

## 2021-02-21 NOTE — Telephone Encounter (Signed)
Patient aware and verbalized understanding. Appt made 

## 2021-02-25 ENCOUNTER — Other Ambulatory Visit: Payer: Self-pay | Admitting: Family

## 2021-02-25 DIAGNOSIS — E1065 Type 1 diabetes mellitus with hyperglycemia: Secondary | ICD-10-CM

## 2021-02-26 ENCOUNTER — Other Ambulatory Visit: Payer: Self-pay | Admitting: Family

## 2021-02-26 DIAGNOSIS — E1065 Type 1 diabetes mellitus with hyperglycemia: Secondary | ICD-10-CM

## 2021-03-12 ENCOUNTER — Other Ambulatory Visit: Payer: Self-pay | Admitting: Family

## 2021-03-13 ENCOUNTER — Other Ambulatory Visit: Payer: Self-pay | Admitting: Family

## 2021-03-13 DIAGNOSIS — I10 Essential (primary) hypertension: Secondary | ICD-10-CM

## 2021-03-15 ENCOUNTER — Ambulatory Visit (INDEPENDENT_AMBULATORY_CARE_PROVIDER_SITE_OTHER): Payer: Medicare Other | Admitting: Pharmacist

## 2021-03-15 DIAGNOSIS — E1165 Type 2 diabetes mellitus with hyperglycemia: Secondary | ICD-10-CM

## 2021-03-15 DIAGNOSIS — Z794 Long term (current) use of insulin: Secondary | ICD-10-CM

## 2021-03-15 MED ORDER — RYBELSUS 7 MG PO TABS: 7.0000 mg | ORAL_TABLET | Freq: Every day | ORAL | 3 refills | Status: DC

## 2021-03-15 MED ORDER — GLUCOSE BLOOD VI STRP: ORAL_STRIP | 12 refills | Status: DC

## 2021-03-15 NOTE — Progress Notes (Signed)
Chronic Care Management Pharmacy Note  03/15/2021 Name:  [name] MRN:  [id number] DOB:  1950-08-23  Summary: T2DM  Recommendations/Changes made from today's visit: Diabetes: New goal. Goal on track: NO. Uncontrolled-A1C 6.0%--HAVING HYPOGLYCEMIA; current treatment: LANTUS 30-40 UNITS, HUMALOG SLIDING SCALE, DECREASE LANTUS TO 25 UNITS INCREASE RYBELSUS 7MG  HOLD HUMALOG  CONTINUE METFORMIN Current glucose readings: fasting glucose: <130, post prandial glucose: UP TO 200 Is not using libre due to "falling off in the bed" Requested patient come in and we can work on solutions to it falling in Would like for patient to use Libre since she is experienced hyper & hypoglycemia REPORTS hypoglycemic/hyperglycemic symptoms Discussed meal planning options and Plate method for healthy eating Avoid sugary drinks and desserts Incorporate balanced protein, non starchy veggies, 1 serving of carbohydrate with each meal Increase water intake Increase physical activity as able Current exercise: n/a Recommended increase rybelsus, drop humalog, decrease lantus, continue metformin  Follow Up Plan: Telephone follow up appointment with care management team member scheduled for: 04/19/21  Subjective: [name] is an 70 y.o. year old female who is a primary patient of Sharion Balloon, FNP.  The CCM team was consulted for assistance with disease management and care coordination needs.    Engaged with patient by telephone for initial visit in response to provider referral for pharmacy case management and/or care coordination services.   Consent to Services:  The patient was given the following information about Chronic Care Management services today, agreed to services, and gave verbal consent: 1. CCM service includes personalized support from designated clinical staff supervised by the primary care provider, including individualized plan of care and coordination with other care providers 2.  24/7 contact phone numbers for assistance for urgent and routine care needs. 3. Service will only be billed when office clinical staff spend 20 minutes or more in a month to coordinate care. 4. Only one practitioner may furnish and bill the service in a calendar month. 5.The patient may stop CCM services at any time (effective at the end of the month) by phone call to the office staff. 6. The patient will be responsible for cost sharing (co-pay) of up to 20% of the service fee (after annual deductible is met). Patient agreed to services and consent obtained.  Patient Care Team: Sharion Balloon, FNP as PCP - General (Family Medicine) Garald Balding, MD as Consulting Physician (Orthopedic Surgery) Danie Binder, MD (Inactive) as Consulting Physician (Gastroenterology) [facility] (Ophthalmology) Lavera Guise, [facility] as Pharmacist (Family Medicine)  Objective:  Lab Results  Component Value Date   CREATININE 1.08 (H) 02/17/2021   CREATININE 1.24 (H) 11/16/2020   CREATININE 1.36 (H) 09/15/2020    Lab Results  Component Value Date   HGBA1C 6.0 (H) 02/17/2021   Last diabetic Eye exam:  Lab Results  Component Value Date/Time   HMDIABEYEEXA No Retinopathy 08/03/2020 12:00 AM    Last diabetic Foot exam: No results found for: HMDIABFOOTEX      Component Value Date/Time   CHOL 136 08/06/2019 1144   TRIG 99 08/06/2019 1144   HDL 36 (L) 08/06/2019 1144   CHOLHDL 3.8 08/06/2019 1144   LDLCALC 81 08/06/2019 1144    Hepatic Function Latest Ref Rng & Units 02/17/2021 11/16/2020 09/15/2020  Total Protein 6.0 - 8.5 g/dL 6.8 7.0 7.3  Albumin 3.8 - 4.8 g/dL 4.0 4.3 4.4  AST 0 - 40 IU/L _0 ALT 0 - 32 IU/L  _0 Alk Phosphatase 44 - 121 IU/L 169(H) 212(H) 173(H)  Total Bilirubin 0.0 - 1.2 mg/dL 0.3 0.3 0.4    Lab Results  Component Value Date/Time   TSH 0.482 11/16/2020 10:30 AM   TSH 5.340 (H) 09/15/2020 11:06 AM    CBC Latest Ref Rng & Units 02/17/2021 11/16/2020  09/15/2020  WBC 3.4 - 10.8 x10E3/uL 6.7 7.9 8.1  Hemoglobin 11.1 - 15.9 g/dL 11.5 12.7 13.2  Hematocrit 34.0 - 46.6 % 35.4 39.1 40.8  Platelets 150 - 450 x10E3/uL 237 262 246    No results found for: VD25OH  Clinical ASCVD: No  The 10-year ASCVD risk score (Arnett DK, et al., 2019) is: 22.4%   Values used to calculate the score:     Age: 19 years     Sex: Female     Is Non-Hispanic African American: Yes     Diabetic: Yes     Tobacco smoker: No     Systolic Blood Pressure: 720 mmHg     Is BP treated: Yes     HDL Cholesterol: 36 mg/dL     Total Cholesterol: 136 mg/dL    Other: (CHADS2VASc if Afib, PHQ9 if depression, MMRC or CAT for COPD, ACT, DEXA)  Social History   Tobacco Use  Smoking Status Never  Smokeless Tobacco Never   BP Readings from Last 3 Encounters:  02/17/21 (!) 144/81  11/16/20 120/75  09/15/20 127/78   Pulse Readings from Last 3 Encounters:  02/17/21 90  11/16/20 96  09/15/20 82   Wt Readings from Last 3 Encounters:  02/17/21 190 lb (86.2 kg)  11/22/20 192 lb (87.1 kg)  11/16/20 193 lb 3.2 oz (87.6 kg)    Assessment: Review of patient past medical history, allergies, medications, health status, including review of consultants reports, laboratory and other test data, was performed as part of comprehensive evaluation and provision of chronic care management services.   SDOH:  (Social Determinants of Health) assessments and interventions performed:    CCM Care Plan  Allergies  Allergen Reactions   Other Itching, Nausea Only and Other (See Comments)    Acidic foods cause stomach upset and itching   Aspirin Other (See Comments)    Can only take 81 - full strength will give acid reflux   Jardiance [Empagliflozin]     Recurrent yeast infections   Allopurinol Rash   Penicillins Rash    Has patient had a PCN reaction causing immediate rash, facial/tongue/throat swelling, SOB or lightheadedness with hypotension: unkn Has patient had a PCN reaction  causing severe rash involving mucus membranes or skin necrosis: unkn Has patient had a PCN reaction that required hospitalization: unkn Has patient had a PCN reaction occurring within the last 10 years: unkn If all of the above answers are "NO", then may proceed with Cephalosporin use.    Sulfa Antibiotics Rash    Medications Reviewed Today     Reviewed by Lavera Guise, [facility] (Pharmacist) on 03/15/21 at 30  Med List Status: <None>   Medication Order Taking? Sig Documenting Provider Last Dose Status Informant  albuterol (VENTOLIN HFA) 108 (90 Base) MCG/ACT inhaler [id number]  TAKE 2 PUFFS BY MOUTH EVERY 6 HOURS AS NEEDED FOR WHEEZE OR SHORTNESS OF BREATH Hawks, Christy A, FNP  Active   atorvastatin (LIPITOR) 20 MG tablet [id number]  TAKE 1 TABLET BY MOUTH EVERY DAY Sharion Balloon, FNP  Active   BD INSULIN SYRINGE U/F 31G X 5/16" 0.5 ML MISC [id number]  USE  TO INJECT LANTUS AND NOVOLOG DAILY Sharion Balloon, FNP  Active   BREO ELLIPTA 200-25 MCG/INH AEPB [id number]  INHALE 1 PUFF INTO THE LUNGS DAILY Hawks, Christy A, FNP  Active   busPIRone (BUSPAR) 5 MG tablet [id number]  TAKE 1-2 TABLETS (5-10 MG TOTAL) BY MOUTH 2 (TWO) TIMES DAILY. Evelina Dun A, FNP  Active   cetirizine (ZYRTEC) 10 MG tablet [id number]  Take 20 mg by mouth daily. [provider]  Active Self  cloNIDine (CATAPRES) 0.1 MG tablet [id number]  TAKE 1 TABLET BY MOUTH EVERY DAY Hawks, Christy A, FNP  Active   docusate sodium (COLACE) 100 MG capsule [id number]  Take 100 mg by mouth 2 (two) times daily. [provider]  Active Self  esomeprazole (NEXIUM) 40 MG capsule [id number]  TAKE 1 CAPSULE BY MOUTH EVERY DAY Hawks, Christy A, FNP  Active   fluticasone (FLONASE) 50 MCG/ACT nasal spray [id number]  Place 1 spray into both nostrils 2 (two) times daily as needed for allergies or rhinitis. [provider]  Active Self  furosemide (LASIX) 40 MG tablet [id number]  TAKE 1 TABLET BY MOUTH EVERY DAY Hawks, Christy  A, FNP  Active   gabapentin (NEURONTIN) 100 MG capsule [id number]  TAKE 1 CAPSULE (100 MG TOTAL) BY MOUTH THREE TIMES DAILY. Evelina Dun A, FNP  Active   glucose blood (ONE TOUCH ULTRA TEST) test strip [id number]  TEST TWICE A DAY Evelina Dun A, FNP  Active   HUMALOG 100 UNIT/ML injection [id number]  INJECT 0.04-0.1 MLS (4-10 UNITS TOTAL) INTO THE SKIN 3 (THREE) TIMES DAILY WITH MEALS. SLIDING SCALE Evelina Dun A, FNP  Active   hydrOXYzine (ATARAX/VISTARIL) 25 MG tablet [id number]  TAKE 1 TABLET BY MOUTH THREE TIMES A DAY Hawks, Christy A, FNP  Active   insulin glargine (LANTUS) 100 UNIT/ML injection [id number]  INJECT 30-40 UNITS INTO THE SKIN DAILY. Evelina Dun A, FNP  Active   levothyroxine (SYNTHROID) 88 MCG tablet [id number]  Take 1 tablet (88 mcg total) by mouth daily. Evelina Dun A, FNP  Active   magnesium hydroxide (MILK OF MAGNESIA) 400 MG/5ML suspension [id number]  Take 60 mLs by mouth See admin instructions. Every 3 days - [name] [provider]  Active Self  metFORMIN (GLUCOPHAGE-XR) 750 MG 24 hr tablet [id number]  TAKE 1 TABLET BY MOUTH EVERY DAY WITH BREAKFAST Hawks, Christy A, FNP  Active   metoprolol succinate (TOPROL-XL) 25 MG 24 hr tablet [id number]  TAKE 1 TABLET BY MOUTH EVERY DAY Verta Ellen., NP  Active   montelukast (SINGULAIR) 10 MG tablet [id number]  TAKE 1 TABLET BY MOUTH EVERY DAY IN THE EVENING Hawks, Christy A, FNP  Active   Multiple Vitamins-Minerals (MULTIVITAMIN WITH MINERALS) tablet [id number]  Take 1 tablet by mouth daily. Centrum Silver [provider]  Active Self  Propylene Glycol (SYSTANE BALANCE OP) [id number]  Place 1 drop into both eyes daily as needed (Dry eyes). [provider]  Active Self  quinapril (ACCUPRIL) 5 MG tablet [id number]  TAKE 2 TABLETS BY MOUTH EVERY DAY Sharion Balloon, FNP  Active   RYBELSUS 3 MG TABS [id number]  TAKE 1 TABLET BY MOUTH ONCE DAILY Sharion Balloon, FNP  Active             Patient  Active Problem List   Diagnosis Date Noted   Vitamin D deficiency 10/27/2019   Recurrent ventral incisional hernia 04/02/2018   Anemia 01/17/2018   DKA (diabetic ketoacidoses) 01/14/2018   Hypothyroidism 01/14/2018  Nightmares 11/23/2017   Irritable bowel syndrome with constipation 02/27/2017   Generalized anxiety disorder 02/27/2017   Mild intermittent asthma 05/15/2016   Obesity (BMI 30-39.9) 04/13/2016   Generalized OA 04/13/2016   Age-related osteoporosis without current pathological fracture 04/13/2016   Osteoarthritis of right hip 02/01/2016   Chronic kidney disease 02/09/2015   Avascular necrosis of left femoral head ([place]) 02/02/2015   S/P total hip arthroplasty 02/02/2015   Moderate mitral insufficiency    Hypertension    Type 2 diabetes mellitus with hyperglycemia (HCC)     Immunization History  Administered Date(s) Administered   Fluad Quad(high Dose 65+) 02/17/2020   Influenza, High Dose Seasonal PF 04/05/2018, 02/21/2019, 02/01/2021   Influenza,inj,Quad PF,6+ Mos 04/13/2016   Janssen (J&J) SARS-COV-2 Vaccination 09/18/2019, 04/08/2020   Pneumococcal Conjugate-13 05/01/2018   Pneumococcal Polysaccharide-23 02/04/2016   Tdap 10/27/2019   Zoster Recombinat (Shingrix) 01/10/2019, 03/19/2019    Conditions to be addressed/monitored: DMII  Care Plan : PHARMD MEDICATION MANAGEMENT  Updates made by Lavera Guise, [place] since 03/15/2021 12:00 AM     Problem: DISEASE PROGRESSION PREVENTION      Goal: Patient Stated      Long-Range Goal: T2DM   This Visit's Progress: Not on track  Priority: High  Note:   Current Barriers:  Unable to maintain control of T2DM Suboptimal therapeutic regimen for T2DM  Pharmacist Clinical Goal(s):  Over the next 90 days, patient will maintain control of T2DM as evidenced by NO HYPOGLYCEMIA--TARGET RANGE BLOOD SUGARS  through collaboration with PharmD and provider.    Interventions: 1:1 collaboration with Sharion Balloon, FNP  regarding development and update of comprehensive plan of care as evidenced by provider attestation and co-signature Inter-disciplinary care team collaboration (see longitudinal plan of care) Comprehensive medication review performed; medication list updated in electronic medical record  Diabetes: New goal. Goal on track: NO. Uncontrolled-A1C 6.0%--HAVING HYPOGLYCEMIA; current treatment: LANTUS 30-40 UNITS, HUMALOG SLIDING SCALE, DECREASE LANTUS TO 25 UNITS INCREASE RYBELSUS 7MG  HOLD HUMALOG  CONTINUE METFORMIN Current glucose readings: fasting glucose: <130, post prandial glucose: UP TO 200 Is not using libre due to "falling off in the bed" Requested patient come in and we can work on solutions to it falling in Would like for patient to use [place] since she is experienced hyper & hypoglycemia REPORTS hypoglycemic/hyperglycemic symptoms Discussed meal planning options and Plate method for healthy eating Avoid sugary drinks and desserts Incorporate balanced protein, non starchy veggies, 1 serving of carbohydrate with each meal Increase water intake Increase physical activity as able Current exercise: n/a Recommended increase rybelsus, drop humalog, decrease lantus, continue metformin   Patient Goals/Self-Care Activities Over the next 90 days, patient will:  - take medications as prescribed check glucose 3 times daily & if symptomatic, document, and provide at future appointments  Follow Up Plan: Telephone follow up appointment with care management team member scheduled for: 04/19/21      Medication Assistance: None required.  Patient affirms current coverage meets needs.  Patient's preferred pharmacy is:  [id number] [id number]: [phone]: [phone]  Follow Up:  Patient agrees to Care Plan and Follow-up.  Plan: Telephone follow up appointment with care management team member  scheduled for:  04/19/21  JRegina Eck PharmD, BCPS Clinical Pharmacist, [place] Phone [phone]

## 2021-03-15 NOTE — Patient Instructions (Signed)
Visit Information  PATIENT GOALS:  Goals Addressed               This Visit's Progress     Patient Stated     T2DM (pt-stated)        Current Barriers:  Unable to maintain control of T2DM Suboptimal therapeutic regimen for T2DM  Pharmacist Clinical Goal(s):  Over the next 90 days, patient will maintain control of T2DM as evidenced by NO HYPOGLYCEMIA--TARGET RANGE BLOOD SUGARS  through collaboration with PharmD and provider.    Interventions: 1:1 collaboration with Sharion Balloon, FNP regarding development and update of comprehensive plan of care as evidenced by provider attestation and co-signature Inter-disciplinary care team collaboration (see longitudinal plan of care) Comprehensive medication review performed; medication list updated in electronic medical record  Diabetes: New goal. Goal on track: NO. Uncontrolled-A1C 6.0%--HAVING HYPOGLYCEMIA; current treatment: LANTUS 30-40 UNITS, HUMALOG SLIDING SCALE, DECREASE LANTUS TO 25 UNITS INCREASE RYBELSUS 7MG   HOLD HUMALOG  CONTINUE METFORMIN Current glucose readings: fasting glucose: <130, post prandial glucose: UP TO 200 Is not using libre due to "falling off in the bed" Requested patient come in and we can work on solutions to it falling in Would like for patient to use [place] since she is experienced hyper & hypoglycemia REPORTS hypoglycemic/hyperglycemic symptoms Discussed meal planning options and Plate method for healthy eating Avoid sugary drinks and desserts Incorporate balanced protein, non starchy veggies, 1 serving of carbohydrate with each meal Increase water intake Increase physical activity as able Current exercise: n/a Recommended increase rybelsus, drop humalog, decrease lantus, continue metformin   Patient Goals/Self-Care Activities Over the next 90 days, patient will:  - take medications as prescribed check glucose 3 times daily & if symptomatic, document, and provide at future  appointments  Follow Up Plan: Telephone follow up appointment with care management team member scheduled for: 04/19/21         The patient verbalized understanding of instructions, educational materials, and care plan provided today and declined offer to receive copy of patient instructions, educational materials, and care plan.   Telephone follow up appointment with care management team member scheduled for:  Signature Regina Eck, PharmD, BCPS Clinical Pharmacist, [place] Phone [phone]

## 2021-03-18 ENCOUNTER — Encounter: Payer: Self-pay | Admitting: Family

## 2021-03-18 ENCOUNTER — Ambulatory Visit (INDEPENDENT_AMBULATORY_CARE_PROVIDER_SITE_OTHER): Payer: Medicare Other | Admitting: Family

## 2021-03-18 ENCOUNTER — Other Ambulatory Visit: Payer: Self-pay

## 2021-03-18 VITALS — BP 106/67 | HR 102 | Temp 97.6°F | Ht 64.0 in | Wt 188.4 lb

## 2021-03-18 DIAGNOSIS — B3731 Acute candidiasis of vulva and vagina: Secondary | ICD-10-CM | POA: Diagnosis not present

## 2021-03-18 DIAGNOSIS — Z794 Long term (current) use of insulin: Secondary | ICD-10-CM | POA: Diagnosis not present

## 2021-03-18 DIAGNOSIS — R399 Unspecified symptoms and signs involving the genitourinary system: Secondary | ICD-10-CM

## 2021-03-18 DIAGNOSIS — E1165 Type 2 diabetes mellitus with hyperglycemia: Secondary | ICD-10-CM | POA: Diagnosis not present

## 2021-03-18 LAB — MICROSCOPIC EXAMINATION
Bacteria, UA: NONE SEEN
RBC, Urine: NONE SEEN /hpf (ref 0–2)

## 2021-03-18 LAB — URINALYSIS, COMPLETE
Bilirubin, UA: NEGATIVE
Glucose, UA: NEGATIVE
Leukocytes,UA: NEGATIVE
Nitrite, UA: NEGATIVE
RBC, UA: NEGATIVE
Specific Gravity, UA: >1.03 — ABNORMAL HIGH (ref 1.005–1.030)
Urobilinogen, Ur: 0.2 mg/dL (ref 0.2–1.0)
pH, UA: 5 (ref 5.0–7.5)

## 2021-03-18 MED ORDER — FLUCONAZOLE 150 MG PO TABS: 150.0000 mg | ORAL_TABLET | ORAL | 0 refills | Status: DC | PRN

## 2021-03-18 NOTE — Progress Notes (Signed)
Subjective:    Patient ID: [name], female    DOB: 09-15-1950, 70 y.o.   MRN: [id number]  Chief Complaint  Patient presents with   Urinary Tract Infection   Pt presents to the office today with vaginal itching and urinary frequency since she started Jardiance.  This was stopped in 10/21/20 and started on Rybelsus. She states her glucose has been 150-250.  Dysuria  This is a new problem. The current episode started in the past 7 days. The problem has been waxing and waning. The quality of the pain is described as burning. The pain is mild. Associated symptoms include frequency, hesitancy and urgency. Pertinent negatives include no flank pain, hematuria or nausea. She has tried increased fluids for the symptoms. The treatment provided mild relief.  Diabetes She presents for her follow-up diabetic visit. She has type 2 diabetes mellitus. Diabetic complications include heart disease. Risk factors for coronary artery disease include dyslipidemia, diabetes mellitus, hypertension and sedentary lifestyle.     Review of Systems  Gastrointestinal:  Negative for nausea.  Genitourinary:  Positive for dysuria, frequency, hesitancy and urgency. Negative for flank pain and hematuria.  All other systems reviewed and are negative.     Objective:   Physical Exam Vitals reviewed.  Constitutional:      General: She is not in acute distress.    Appearance: She is well-developed.  HENT:     Head: Normocephalic and atraumatic.     Right Ear: Tympanic membrane normal.     Left Ear: Tympanic membrane normal.  Eyes:     Pupils: Pupils are equal, round, and reactive to light.  Neck:     Thyroid: No thyromegaly.  Cardiovascular:     Rate and Rhythm: Normal rate and regular rhythm.     Heart sounds: Normal heart sounds. No murmur heard. Pulmonary:     Effort: Pulmonary effort is normal. No respiratory distress.     Breath sounds: Normal breath sounds. No wheezing.  Abdominal:     General: Bowel  sounds are normal. There is no distension.     Palpations: Abdomen is soft.     Tenderness: There is no abdominal tenderness.  Musculoskeletal:        General: No tenderness. Normal range of motion.     Cervical back: Normal range of motion and neck supple.  Skin:    General: Skin is warm and dry.  Neurological:     Mental Status: She is alert and oriented to person, place, and time.     Cranial Nerves: No cranial nerve deficit.     Deep Tendon Reflexes: Reflexes are normal and symmetric.  Psychiatric:        Behavior: Behavior normal.        Thought Content: Thought content normal.        Judgment: Judgment normal.         BP 106/67   Pulse (!) 102   Temp 97.6 F (36.4 C) (Temporal)   Ht 5\' 4"  (1.626 m)   Wt 188 lb 6.4 oz (85.5 kg)   BMI 32.34 kg/m   Assessment & Plan:  [name] comes in today with chief complaint of Urinary Tract Infection   Diagnosis and orders addressed:  1. UTI symptoms Urine negative, will treat as yeast - Urinalysis, Complete - Urine Culture  2. Vagina, candidiasis Keep clean and dry Avoid scratching Start Diflucan If symptoms continue will need referral as this has been on going on and off  for 6 months - fluconazole (DIFLUCAN) 150 MG tablet; Take 1 tablet (150 mg total) by mouth every three (3) days as needed.  Dispense: 3 tablet; Refill: 0  3. Type 2 diabetes mellitus with hyperglycemia, with long-term current use of insulin (HCC) Need to have good control of glucose  Continue current medications     Evelina Dun, FNP

## 2021-03-18 NOTE — Patient Instructions (Signed)

## 2021-03-21 ENCOUNTER — Other Ambulatory Visit: Payer: Self-pay | Admitting: Family

## 2021-03-29 ENCOUNTER — Other Ambulatory Visit: Payer: Self-pay | Admitting: Family

## 2021-03-29 DIAGNOSIS — E1065 Type 1 diabetes mellitus with hyperglycemia: Secondary | ICD-10-CM

## 2021-04-07 ENCOUNTER — Other Ambulatory Visit: Payer: Self-pay | Admitting: Family

## 2021-04-07 DIAGNOSIS — I10 Essential (primary) hypertension: Secondary | ICD-10-CM

## 2021-04-07 DIAGNOSIS — E1065 Type 1 diabetes mellitus with hyperglycemia: Secondary | ICD-10-CM

## 2021-04-11 DIAGNOSIS — E1165 Type 2 diabetes mellitus with hyperglycemia: Secondary | ICD-10-CM

## 2021-04-11 DIAGNOSIS — Z794 Long term (current) use of insulin: Secondary | ICD-10-CM

## 2021-04-19 ENCOUNTER — Ambulatory Visit: Payer: Medicare Other | Admitting: Pharmacist

## 2021-04-19 DIAGNOSIS — Z794 Long term (current) use of insulin: Secondary | ICD-10-CM

## 2021-04-23 ENCOUNTER — Other Ambulatory Visit: Payer: Self-pay | Admitting: Family

## 2021-04-25 ENCOUNTER — Other Ambulatory Visit: Payer: Self-pay | Admitting: Family

## 2021-04-28 ENCOUNTER — Telehealth: Payer: Self-pay | Admitting: Family

## 2021-04-28 NOTE — Progress Notes (Signed)
  Care Management   Follow Up Note   04/19/2021 Name: [name] MRN: [id number] DOB: January 17, 1951   Referred by: Sharion Balloon, FNP Reason for referral : No chief complaint on file.   An unsuccessful telephone outreach was attempted today. The patient was referred to the case management team for assistance with care management and care coordination.   Follow Up Plan: Telephone follow up appointment with care management team member scheduled for:2 MONTHS    Regina Eck, PharmD, BCPS Clinical Pharmacist, [place]  II Phone [phone]

## 2021-04-30 ENCOUNTER — Other Ambulatory Visit: Payer: Self-pay | Admitting: Family

## 2021-04-30 DIAGNOSIS — E1065 Type 1 diabetes mellitus with hyperglycemia: Secondary | ICD-10-CM

## 2021-05-02 NOTE — Telephone Encounter (Signed)
This is ok, her last A1C was at goal. She needs to continue those medications and low carb diet.

## 2021-05-02 NOTE — Telephone Encounter (Signed)
Pt aware of provider feedback and voiced understanding. 

## 2021-05-02 NOTE — Telephone Encounter (Signed)
Yes this could be causing constipation. I recommend starting a stool softener and or miralax.

## 2021-05-02 NOTE — Telephone Encounter (Signed)
Patient aware and verbalizes understanding.  States that she has stopped taking the rybelsus and would like to just stay on metformin and her insulins.

## 2021-05-16 NOTE — Progress Notes (Addendum)
Cardiology Office Note:    Date:  05/25/2021   ID:  [name], DOB Dec 25, 1950, MRN [id number]  PCP:  Sharion Balloon, FNP   [facility] Providers Cardiologist:  None {   Referring MD: Sharion Balloon, FNP    History of Present Illness:    [name] is a 70 y.o. female with a hx of HTN, DMII, HLD, and moderate MR who was previously followed by Dr. Bronson Ing who now presents to clinic for follow-up.  Last saw Levell July in clinic on 05/2020 where he had obtained TTE and myoview due to DOE and chest discomfort. TTE 04/2020 with LVEF 60-65%, normal RV, normal PASP, mild MR, aortic sclerosis. Myoview 04/2020 negative for ischemic or infarction. Given reassuring work-up, thought DOE was likely related to deconditioning.  Today, the patient states she feels overall well.  Has had some dyspnea which she attributes to her ashtma flaring with the change of the weather. Has occasional palpitations especially with her inhalers. No chest pain. Occasional dizziness with changing positions. No syncope. Blood pressures 130-140s/60-70s. Has been compliant with medications. She is hoping to get a treadmill at home so she can increase her walking. She is not very active at baseline.  Past Medical History:  Diagnosis Date   Allergic rhinitis    Allergy    Anemia    Ankle swelling    Anxiety    takes Xanax daily    Arthritis    Asthma    Blurred vision, bilateral    Chronic kidney disease    Depression    takes Celexa daily   Diabetes mellitus without complication ([place])    type II; takes Actos daily   GERD (gastroesophageal reflux disease)    Gout    Headache    Heart murmur    Heart palpitations    Hemorrhoids    History of blood transfusion    no abnormal reaction noted   History of bronchitis    Hyperlipidemia    Hypertension    takes Catapres and Quinapril daily   Hypothyroidism    takes Synthroid daily   Moderate mitral insufficiency    a. 12/2014 Echo: EF 55-60%,  mod MR, mildly to moderately dil LA, PASP 20mmHg.   Myocardial infarction [facility])    " Minor"   Osteoarthritis    Paroxysmal supraventricular tachycardia (HCC)    Recurrent ventral incisional hernia    Ringing in ears    Shortness of breath dyspnea    ALbuterol daily as needed   Stevens-Johnson disease ([place])    Urinary incontinence    Wears glasses     Past Surgical History:  Procedure Laterality Date   ABDOMINAL HYSTERECTOMY     BOWEL RESECTION     COLON SURGERY     COLONOSCOPY     COLONOSCOPY WITH PROPOFOL N/A 04/29/2019   Procedure: COLONOSCOPY WITH PROPOFOL;  Surgeon: Danie Binder, MD;  [facility];  Service: Endoscopy;  Laterality: N/A;  7:30am   ESOPHAGOGASTRODUODENOSCOPY     HEMORRHOID SURGERY  1998   HERNIA REPAIR     hiatal hernia repair   HIATAL HERNIA REPAIR  1980   pt is unsure of exact date but thinks it was early 44's   [place] N/A 04/02/2018   Procedure: OPEN VENTRAL HERNIA REPAIR WITH MESH, Explantation of hernia mesh;  Surgeon: Donnie Mesa, MD;  Location: [place];  Service: General;  Laterality: N/A;   INSERTION OF MESH N/A 04/02/2018  Procedure: INSERTION OF MESH;  Surgeon: Donnie Mesa, MD;  Location: [place];  Service: General;  Laterality: N/A;   POLYPECTOMY  04/29/2019   Procedure: POLYPECTOMY;  Surgeon: Danie Binder, MD;  [facility];  Service: Endoscopy;;   TOTAL HIP ARTHROPLASTY Left 02/02/2015   Procedure: LEFT TOTAL HIP ARTHROPLASTY;  Surgeon: Garald Balding, MD;  Location: [place];  Service: Orthopedics;  Laterality: Left;   TOTAL HIP ARTHROPLASTY Right 02/01/2016   Procedure: TOTAL HIP ARTHROPLASTY;  Surgeon: Garald Balding, MD;  Location: [place];  Service: Orthopedics;  Laterality: Right;    Current Medications: Current Meds  Medication Sig   albuterol (VENTOLIN HFA) 108 (90 Base) MCG/ACT inhaler TAKE 2 PUFFS BY MOUTH EVERY 6 HOURS AS NEEDED FOR WHEEZE OR SHORTNESS OF BREATH   atorvastatin  (LIPITOR) 20 MG tablet TAKE 1 TABLET BY MOUTH EVERY DAY   BD INSULIN SYRINGE U/F 31G X 5/16" 0.5 ML MISC USE TO INJECT LANTUS AND NOVOLOG DAILY   BREO ELLIPTA 200-25 MCG/INH AEPB TAKE 1 PUFF BY MOUTH EVERY DAY   busPIRone (BUSPAR) 5 MG tablet TAKE 1-2 TABLETS (5-10 MG TOTAL) BY MOUTH 2 (TWO) TIMES DAILY.   carvedilol (COREG) 6.25 MG tablet Take 1 tablet (6.25 mg total) by mouth 2 (two) times daily.   cetirizine (ZYRTEC) 10 MG tablet Take 20 mg by mouth daily.   cloNIDine (CATAPRES) 0.1 MG tablet TAKE 1 TABLET BY MOUTH EVERY DAY   docusate sodium (COLACE) 100 MG capsule Take 100 mg by mouth 2 (two) times daily.   esomeprazole (NEXIUM) 40 MG capsule TAKE 1 CAPSULE BY MOUTH EVERY DAY   fluticasone (FLONASE) 50 MCG/ACT nasal spray Place 1 spray into both nostrils 2 (two) times daily as needed for allergies or rhinitis.   furosemide (LASIX) 40 MG tablet TAKE 1 TABLET BY MOUTH EVERY DAY   gabapentin (NEURONTIN) 100 MG capsule TAKE 1 CAPSULE (100 MG TOTAL) BY MOUTH THREE TIMES DAILY.   glucose blood (ONE TOUCH ULTRA TEST) test strip USE TO TEST BLOOD SUGAR 3 TIMES DAILY AS DIRECTED. DX: E11.65   HUMALOG 100 UNIT/ML injection INJECT 0.04-0.1 MLS (4-10 UNITS TOTAL) INTO THE SKIN 3 (THREE) TIMES DAILY WITH MEALS. SLIDING SCALE   hydrOXYzine (ATARAX/VISTARIL) 25 MG tablet TAKE 1 TABLET BY MOUTH THREE TIMES A DAY   insulin glargine (LANTUS) 100 UNIT/ML injection INJECT 30-40 UNITS INTO THE SKIN DAILY.   levothyroxine (SYNTHROID) 88 MCG tablet Take 1 tablet (88 mcg total) by mouth daily.   magnesium hydroxide (MILK OF MAGNESIA) 400 MG/5ML suspension Take 60 mLs by mouth See admin instructions. Every 3 days - [name]   metFORMIN (GLUCOPHAGE-XR) 750 MG 24 hr tablet TAKE 1 TABLET BY MOUTH EVERY DAY WITH BREAKFAST   montelukast (SINGULAIR) 10 MG tablet TAKE 1 TABLET BY MOUTH EVERY DAY IN THE EVENING   Multiple Vitamins-Minerals (MULTIVITAMIN WITH MINERALS) tablet Take 1 tablet by mouth daily. Centrum Silver    Propylene Glycol (SYSTANE BALANCE OP) Place 1 drop into both eyes daily as needed (Dry eyes).   quinapril (ACCUPRIL) 5 MG tablet TAKE 2 TABLETS BY MOUTH EVERY DAY   [DISCONTINUED] metoprolol succinate (TOPROL-XL) 25 MG 24 hr tablet TAKE 1 TABLET BY MOUTH EVERY DAY     Allergies:   Other, Aspirin, Jardiance [empagliflozin], Allopurinol, Penicillins, and Sulfa antibiotics   Social History   Socioeconomic History   Marital status: Single    Spouse name: Not on file   Number of children: 0   Years  of education: 12   Highest education level: High school graduate  Occupational History   Occupation: retired  Tobacco Use   Smoking status: Never   Smokeless tobacco: Never  Vaping Use   Vaping Use: Never used  Substance and Sexual Activity   Alcohol use: No    Alcohol/week: 0.0 standard drinks   Drug use: No   Sexual activity: Not Currently    Birth control/protection: Surgical  Other Topics Concern   Not on file  Social History Narrative   Lives in single level home with her sister.    Social Determinants of Health   Financial Resource Strain: Low Risk    Difficulty of Paying Living Expenses: Not hard at all  Food Insecurity: No Food Insecurity   Worried About Charity fundraiser in the Last Year: Never true   [place] in the Last Year: Never true  Transportation Needs: No Transportation Needs   Lack of Transportation (Medical): No   Lack of Transportation (Non-Medical): No  Physical Activity: Inactive   Days of Exercise per Week: 0 days   Minutes of Exercise per Session: 0 min  Stress: No Stress Concern Present   Feeling of Stress : Not at all  Social Connections: Moderately Integrated   Frequency of Communication with Friends and Family: More than three times a week   Frequency of Social Gatherings with Friends and Family: More than three times a week   Attends Religious Services: More than 4 times per year   Active Member of [employer] or Organizations: Yes   Attends  Archivist Meetings: 1 to 4 times per year   Marital Status: Never married     Family History: The patient's family history includes COPD in her father; Cancer in her father; Colon cancer in her maternal aunt and paternal aunt; Colon polyps in her mother; Diabetes in her mother; Heart attack in her mother; Hyperlipidemia in her sister; Hypertension in her mother and sister; Kidney disease in her mother.  ROS:   Please see the history of present illness.    Review of Systems  Constitutional:  Negative for chills and fever.  Respiratory:  Positive for shortness of breath and wheezing.   Cardiovascular:  Positive for palpitations. Negative for chest pain, orthopnea, claudication, leg swelling and PND.  Gastrointestinal:  Negative for blood in stool and melena.  Genitourinary:  Negative for hematuria.  Musculoskeletal:  Positive for joint pain.  Neurological:  Positive for dizziness. Negative for loss of consciousness.    EKGs/Labs/Other Studies Reviewed:    The following studies were reviewed today: TTE 05/06/2020: IMPRESSIONS   1. Left ventricular ejection fraction, by estimation, is 60 to 65%. The  left ventricle has normal function. Left ventricular endocardial border  not optimally defined to evaluate regional wall motion. Left ventricular  diastolic parameters are  indeterminate.   2. Right ventricular systolic function is normal. The right ventricular  size is normal. There is normal pulmonary artery systolic pressure. The  estimated right ventricular systolic pressure is 40.9 mmHg.   3. The mitral valve is abnormal. Mild mitral valve regurgitation.   4. The aortic valve is tricuspid. Aortic valve regurgitation is not  visualized. Mild aortic valve sclerosis is present, with no evidence of  aortic valve stenosis.   5. The inferior vena cava is normal in size with greater than 50%  respiratory variability, suggesting right atrial pressure of 3 mmHg.   Comparison(s):  Echocardiogram done 01/26/16 showed an EF of  60-65%.   Myoview 04/21/2020: There was no ST segment deviation noted during stress. The study is normal. There are no perfusion defects This is a low risk study. The left ventricular ejection fraction is hyperdynamic (>65%).  EKG:   NSR, TWI diffusely (inferior, anterolateral) which is chronic, HR 85  Recent Labs: 05/17/2021: ALT 8; BUN 19; Creatinine, Ser 1.18; Hemoglobin 12.5; Platelets 260; Potassium 4.8; Sodium 144; TSH 0.561  Recent Lipid Panel    Component Value Date/Time   CHOL 136 08/06/2019 1144   TRIG 99 08/06/2019 1144   HDL 36 (L) 08/06/2019 1144   CHOLHDL 3.8 08/06/2019 1144   LDLCALC 81 08/06/2019 1144     Risk Assessment/Calculations:           Physical Exam:    VS:  BP (!) 140/92   Pulse 85   Ht 5\' 4"  (1.626 m)   Wt 188 lb 9.6 oz (85.5 kg)   SpO2 99%   BMI 32.37 kg/m     Wt Readings from Last 3 Encounters:  05/25/21 188 lb 9.6 oz (85.5 kg)  05/17/21 183 lb 3.2 oz (83.1 kg)  03/18/21 188 lb 6.4 oz (85.5 kg)     GEN:  Well nourished, well developed in no acute distress HEENT: Normal NECK: No JVD; No carotid bruits CARDIAC: RRR, no murmurs, rubs, gallops RESPIRATORY:  Clear to auscultation without rales, wheezing or rhonchi  ABDOMEN: Soft, non-tender, non-distended MUSCULOSKELETAL:  No edema; No deformity  SKIN: Warm and dry NEUROLOGIC:  Alert and oriented x 3 PSYCHIATRIC:  Normal affect   ASSESSMENT:    1. Essential hypertension    PLAN:    In order of problems listed above:  #DOE: Reassuring cardiac work-up. TTE 04/2020 with LVEF 60-65%, normal RV, normal PASP, mild MR, aortic sclerosis. Myoview 04/2020 negative for ischemic or infarction.Symptoms thought mainly due to deconditioning following knee surgery -Increase daily exercise as tolerated  #Palpitations: Improved. Mainly flare with use of her inhalers -Change metoprolol to coreg 6.25mg  BID for better BP control  #HTN: Elevated at  130-140s.  -Continue quinapril 10mg  daily -Continue clonidine daily -Continue lasix 40mg  daily -Change metoprolol to coreg 6.25mg  BID for better BP control  #HLD: LDL 81. Goal <70. Has not been taking her lipitor regularly. Will re-check once she takes it consistently. -Continue lipitor 20mg  daily -Repeat lipids per PCP once patient is taking lipitor more regularly     #DMII: Managed by PCP. Did not tolerate jardiance due to yeast infections. Declined GLP1 agonist today. -Management per PCP -On insulin     Medication Adjustments/Labs and Tests Ordered: Current medicines are reviewed at length with the patient today.  Concerns regarding medicines are outlined above.  Orders Placed This Encounter  Procedures   EKG 12-Lead   Meds ordered this encounter  Medications   carvedilol (COREG) 6.25 MG tablet    Sig: Take 1 tablet (6.25 mg total) by mouth 2 (two) times daily.    Dispense:  180 tablet    Refill:  3    05/25/21 metoprolol stopped    Patient Instructions  Medication Instructions:  STOP Metoprolol   START Coreg 6.25 mg twice a day   Labwork: none  Testing/Procedures: none  Follow-Up: 1 year  Any Other Special Instructions Will Be Listed Below (If Applicable).  If you need a refill on your cardiac medications before your next appointment, please call your pharmacy.   Signed, Freada Bergeron, MD  05/25/2021 12:45 PM    [place]  [facility]  

## 2021-05-17 ENCOUNTER — Ambulatory Visit (INDEPENDENT_AMBULATORY_CARE_PROVIDER_SITE_OTHER): Payer: Medicare Other | Admitting: Family

## 2021-05-17 ENCOUNTER — Encounter: Payer: Self-pay | Admitting: Family

## 2021-05-17 VITALS — BP 135/89 | HR 82 | Temp 97.0°F | Ht 64.0 in | Wt 183.2 lb

## 2021-05-17 DIAGNOSIS — I340 Nonrheumatic mitral (valve) insufficiency: Secondary | ICD-10-CM | POA: Diagnosis not present

## 2021-05-17 DIAGNOSIS — F411 Generalized anxiety disorder: Secondary | ICD-10-CM

## 2021-05-17 DIAGNOSIS — I10 Essential (primary) hypertension: Secondary | ICD-10-CM

## 2021-05-17 DIAGNOSIS — M159 Polyosteoarthritis, unspecified: Secondary | ICD-10-CM | POA: Diagnosis not present

## 2021-05-17 DIAGNOSIS — E669 Obesity, unspecified: Secondary | ICD-10-CM

## 2021-05-17 DIAGNOSIS — N189 Chronic kidney disease, unspecified: Secondary | ICD-10-CM

## 2021-05-17 DIAGNOSIS — E039 Hypothyroidism, unspecified: Secondary | ICD-10-CM

## 2021-05-17 DIAGNOSIS — J4520 Mild intermittent asthma, uncomplicated: Secondary | ICD-10-CM

## 2021-05-17 DIAGNOSIS — Z794 Long term (current) use of insulin: Secondary | ICD-10-CM | POA: Diagnosis not present

## 2021-05-17 DIAGNOSIS — E1165 Type 2 diabetes mellitus with hyperglycemia: Secondary | ICD-10-CM

## 2021-05-17 LAB — BAYER DCA HB A1C WAIVED: HB A1C (BAYER DCA - WAIVED): 6 % — ABNORMAL HIGH (ref 4.8–5.6)

## 2021-05-17 NOTE — Patient Instructions (Signed)

## 2021-05-17 NOTE — Progress Notes (Signed)
Subjective:    Patient ID: [name], female    DOB: April 25, 1951, 70 y.o.   MRN: [id number]  Chief Complaint  Patient presents with   Medical Management of Chronic Issues   Pt calls the office today for chronic follow up. She is followed Cardiologists annually for mitral insufficiency. She has CKD and avoids NSAIDs.  Diabetes She presents for her follow-up diabetic visit. She has type 2 diabetes mellitus. Hypoglycemia symptoms include nervousness/anxiousness. Associated symptoms include foot paresthesias. Pertinent negatives for diabetes include no blurred vision. Symptoms are stable. Diabetic complications include heart disease, nephropathy and peripheral neuropathy. Risk factors for coronary artery disease include diabetes mellitus, hypertension, sedentary lifestyle, dyslipidemia and obesity. She is following a generally healthy diet. Her overall blood glucose range is 110-130 mg/dl. An ACE inhibitor/angiotensin II receptor blocker is being taken. Eye exam is current.  Hypertension This is a chronic problem. The current episode started more than 1 year ago. The problem has been resolved since onset. The problem is controlled. Associated symptoms include malaise/fatigue. Pertinent negatives include no blurred vision, peripheral edema or shortness of breath. Risk factors for coronary artery disease include dyslipidemia, diabetes mellitus, obesity and sedentary lifestyle. The current treatment provides moderate improvement. Identifiable causes of hypertension include a thyroid problem.  Asthma She complains of cough. There is no hoarse voice or shortness of breath. This is a chronic problem. The current episode started more than 1 year ago. The problem occurs intermittently. The problem has been waxing and waning. Associated symptoms include malaise/fatigue. Her symptoms are alleviated by rest (breo). She reports moderate improvement on treatment. Her past medical history is significant for asthma.   Arthritis Presents for follow-up visit. She complains of pain and stiffness. Affected locations include the right knee, left knee, left MCP and right MCP. Her pain is at a severity of 7/10.  Anxiety Presents for follow-up visit. Symptoms include excessive worry, irritability and nervous/anxious behavior. Patient reports no shortness of breath. Symptoms occur most days. The severity of symptoms is moderate.   Her past medical history is significant for asthma.  Thyroid Problem Presents for follow-up visit. Symptoms include anxiety. Patient reports no diaphoresis, dry skin or hoarse voice. The symptoms have been stable.     Review of Systems  Constitutional:  Positive for irritability and malaise/fatigue. Negative for diaphoresis.  HENT:  Negative for hoarse voice.   Eyes:  Negative for blurred vision.  Respiratory:  Positive for cough. Negative for shortness of breath.   Musculoskeletal:  Positive for arthritis and stiffness.  Psychiatric/Behavioral:  The patient is nervous/anxious.   All other systems reviewed and are negative.     Objective:   Physical Exam Vitals reviewed.  Constitutional:      General: She is not in acute distress.    Appearance: She is well-developed. She is obese.  HENT:     Head: Normocephalic and atraumatic.     Right Ear: Tympanic membrane normal.     Left Ear: Tympanic membrane normal.  Eyes:     Pupils: Pupils are equal, round, and reactive to light.  Neck:     Thyroid: No thyromegaly.  Cardiovascular:     Rate and Rhythm: Normal rate and regular rhythm.     Heart sounds: Normal heart sounds. No murmur heard. Pulmonary:     Effort: Pulmonary effort is normal. No respiratory distress.     Breath sounds: Normal breath sounds. No wheezing.  Abdominal:     General: Bowel sounds are normal. There  is no distension.     Palpations: Abdomen is soft.     Tenderness: There is no abdominal tenderness.  Musculoskeletal:        General: No tenderness.  Normal range of motion.     Cervical back: Normal range of motion and neck supple.  Skin:    General: Skin is warm and dry.  Neurological:     Mental Status: She is alert and oriented to person, place, and time.     Cranial Nerves: No cranial nerve deficit.     Deep Tendon Reflexes: Reflexes are normal and symmetric.  Psychiatric:        Behavior: Behavior normal.        Thought Content: Thought content normal.        Judgment: Judgment normal.    Diabetic Foot Exam - Simple   Simple Foot Form Diabetic Foot exam was performed with the following findings: Yes 05/17/2021  9:47 AM  Visual Inspection No deformities, no ulcerations, no other skin breakdown bilaterally: Yes Sensation Testing Intact to touch and monofilament testing bilaterally: Yes Pulse Check Posterior Tibialis and Dorsalis pulse intact bilaterally: Yes Comments      BP 135/89   Pulse 82   Temp (!) 97 F (36.1 C) (Temporal)   Ht 5' 4" (1.626 m)   Wt 183 lb 3.2 oz (83.1 kg)   BMI 31.45 kg/m      Assessment & Plan:  CHRISHAUNA MEE comes in today with chief complaint of Medical Management of Chronic Issues   Diagnosis and orders addressed:  1. Primary hypertension - CMP14+EGFR - CBC with Differential/Platelet  2. Moderate mitral insufficiency - CMP14+EGFR - CBC with Differential/Platelet  3. Mild intermittent asthma, unspecified whether complicated - HCW23+JSEG - CBC with Differential/Platelet  4. Type 2 diabetes mellitus with hyperglycemia, with long-term current use of insulin (HCC) - CMP14+EGFR - CBC with Differential/Platelet - Bayer DCA Hb A1c Waived  5. Hypothyroidism, unspecified type - CMP14+EGFR - CBC with Differential/Platelet - TSH  6. Generalized OA - CMP14+EGFR - CBC with Differential/Platelet  7. Chronic kidney disease, unspecified CKD stage - CMP14+EGFR - CBC with Differential/Platelet  8. Generalized anxiety disorder - CMP14+EGFR - CBC with Differential/Platelet  9.  Obesity (BMI 30-39.9) - CMP14+EGFR - CBC with Differential/Platelet   Labs pending Health Maintenance reviewed Diet and exercise encouraged  Follow up plan: 4 months    Evelina Dun, FNP

## 2021-05-18 LAB — CBC WITH DIFFERENTIAL/PLATELET
Basophils Absolute: 0 10*3/uL (ref 0.0–0.2)
Basos: 0 %
EOS (ABSOLUTE): 0.2 10*3/uL (ref 0.0–0.4)
Eos: 3 %
Hematocrit: 38.8 % (ref 34.0–46.6)
Hemoglobin: 12.5 g/dL (ref 11.1–15.9)
Immature Grans (Abs): 0 10*3/uL (ref 0.0–0.1)
Immature Granulocytes: 0 %
Lymphocytes Absolute: 2 10*3/uL (ref 0.7–3.1)
Lymphs: 28 %
MCH: 24.7 pg — ABNORMAL LOW (ref 26.6–33.0)
MCHC: 32.2 g/dL (ref 31.5–35.7)
MCV: 77 fL — ABNORMAL LOW (ref 79–97)
Monocytes Absolute: 0.4 10*3/uL (ref 0.1–0.9)
Monocytes: 6 %
Neutrophils Absolute: 4.5 10*3/uL (ref 1.4–7.0)
Neutrophils: 63 %
Platelets: 260 10*3/uL (ref 150–450)
RBC: 5.07 x10E6/uL (ref 3.77–5.28)
RDW: 12.8 % (ref 11.7–15.4)
WBC: 7.1 10*3/uL (ref 3.4–10.8)

## 2021-05-18 LAB — CMP14+EGFR
ALT: 8 IU/L (ref 0–32)
AST: 14 IU/L (ref 0–40)
Albumin/Globulin Ratio: 1.4 (ref 1.2–2.2)
Albumin: 4 g/dL (ref 3.8–4.8)
Alkaline Phosphatase: 181 IU/L — ABNORMAL HIGH (ref 44–121)
BUN/Creatinine Ratio: 16 (ref 12–28)
BUN: 19 mg/dL (ref 8–27)
Bilirubin Total: 0.2 mg/dL (ref 0.0–1.2)
CO2: 22 mmol/L (ref 20–29)
Calcium: 9.7 mg/dL (ref 8.7–10.3)
Chloride: 107 mmol/L — ABNORMAL HIGH (ref 96–106)
Creatinine, Ser: 1.18 mg/dL — ABNORMAL HIGH (ref 0.57–1.00)
Globulin, Total: 2.8 g/dL (ref 1.5–4.5)
Glucose: 116 mg/dL — ABNORMAL HIGH (ref 70–99)
Potassium: 4.8 mmol/L (ref 3.5–5.2)
Sodium: 144 mmol/L (ref 134–144)
Total Protein: 6.8 g/dL (ref 6.0–8.5)
eGFR: 50 mL/min/{1.73_m2} — ABNORMAL LOW (ref >59)

## 2021-05-18 LAB — TSH: TSH: 0.561 u[IU]/mL (ref 0.450–4.500)

## 2021-05-25 ENCOUNTER — Encounter: Payer: Self-pay | Admitting: Cardiology

## 2021-05-25 ENCOUNTER — Other Ambulatory Visit: Payer: Self-pay

## 2021-05-25 ENCOUNTER — Ambulatory Visit (INDEPENDENT_AMBULATORY_CARE_PROVIDER_SITE_OTHER): Payer: Medicare Other | Admitting: Cardiology

## 2021-05-25 VITALS — BP 140/92 | HR 85 | Ht 64.0 in | Wt 188.6 lb

## 2021-05-25 DIAGNOSIS — E118 Type 2 diabetes mellitus with unspecified complications: Secondary | ICD-10-CM

## 2021-05-25 DIAGNOSIS — I10 Essential (primary) hypertension: Secondary | ICD-10-CM | POA: Diagnosis not present

## 2021-05-25 DIAGNOSIS — E782 Mixed hyperlipidemia: Secondary | ICD-10-CM

## 2021-05-25 DIAGNOSIS — R0609 Other forms of dyspnea: Secondary | ICD-10-CM

## 2021-05-25 DIAGNOSIS — R002 Palpitations: Secondary | ICD-10-CM

## 2021-05-25 DIAGNOSIS — Z794 Long term (current) use of insulin: Secondary | ICD-10-CM | POA: Diagnosis not present

## 2021-05-25 MED ORDER — CARVEDILOL 6.25 MG PO TABS: 6.2500 mg | ORAL_TABLET | Freq: Two times a day (BID) | ORAL | 3 refills | Status: DC

## 2021-05-25 NOTE — Patient Instructions (Signed)
Medication Instructions:  STOP Metoprolol   START Coreg 6.25 mg twice a day   Labwork: none  Testing/Procedures: none  Follow-Up: 1 year  Any Other Special Instructions Will Be Listed Below (If Applicable).  If you need a refill on your cardiac medications before your next appointment, please call your pharmacy.

## 2021-05-26 ENCOUNTER — Telehealth: Payer: Medicare Other

## 2021-05-27 ENCOUNTER — Other Ambulatory Visit: Payer: Self-pay | Admitting: Family

## 2021-05-27 DIAGNOSIS — E1065 Type 1 diabetes mellitus with hyperglycemia: Secondary | ICD-10-CM

## 2021-05-29 ENCOUNTER — Other Ambulatory Visit: Payer: Self-pay | Admitting: Family

## 2021-05-29 DIAGNOSIS — E1065 Type 1 diabetes mellitus with hyperglycemia: Secondary | ICD-10-CM

## 2021-05-31 ENCOUNTER — Other Ambulatory Visit: Payer: Self-pay | Admitting: Family

## 2021-06-11 ENCOUNTER — Other Ambulatory Visit: Payer: Self-pay | Admitting: Family

## 2021-06-11 DIAGNOSIS — I10 Essential (primary) hypertension: Secondary | ICD-10-CM

## 2021-06-21 ENCOUNTER — Other Ambulatory Visit: Payer: Self-pay | Admitting: Family

## 2021-06-21 DIAGNOSIS — I10 Essential (primary) hypertension: Secondary | ICD-10-CM

## 2021-07-15 ENCOUNTER — Telehealth: Payer: Self-pay | Admitting: Cardiology

## 2021-07-15 NOTE — Telephone Encounter (Signed)
Spoke with the patient who reports that she is not been tolerating carvedilol. She states that she has been lightheaded, tired, and weak. She has not had any syncope. Her blood pressure and heart rate have been normal, 113/68, 82 and 121/73, 89. Patient reports that she is staying hydrated. Advised patient to change positions slowly. Advised to hold carvedilol to see if symptoms improve and that I would send message to Dr. Johney Frame for further advisement. Patient verbalized understanding.

## 2021-07-15 NOTE — Telephone Encounter (Signed)
Pt c/o medication issue:  1. Name of Medication: carvedilol (COREG) 6.25 MG tablet  2. How are you currently taking this medication (dosage and times per day)? Take 1 tablet (6.25 mg total) by mouth 2 (two) times daily.  3. Are you having a reaction (difficulty breathing--STAT)?   4. What is your medication issue? Medication has been making her feel lightheaded and weak.  Sometime she feels like she going to faint, but she doesn't pass out.

## 2021-07-17 NOTE — Telephone Encounter (Signed)
Completely agree. She may just need a  lower dose. Keep [place] posted on symptoms.

## 2021-07-18 ENCOUNTER — Other Ambulatory Visit: Payer: Self-pay | Admitting: Family

## 2021-07-18 DIAGNOSIS — F411 Generalized anxiety disorder: Secondary | ICD-10-CM

## 2021-07-18 DIAGNOSIS — E1065 Type 1 diabetes mellitus with hyperglycemia: Secondary | ICD-10-CM

## 2021-07-18 DIAGNOSIS — I10 Essential (primary) hypertension: Secondary | ICD-10-CM

## 2021-07-20 NOTE — Telephone Encounter (Signed)
Reports symptoms have improved since holding carvedilol but says her BP is increasing 9:00 am left arm 138/87 HR 75 right arm 140/81 HR 79 1:00 pm left arm 143/76 HR 76 right arm 133/62 HR 75  Advised that blood pressures not too bad Advised that if her blood pressure continue to increase that she could restart carvedilol at 3.125 mg twice daily and to contact our office if she does.  Verbalized understanding of plan

## 2021-07-24 ENCOUNTER — Other Ambulatory Visit: Payer: Self-pay | Admitting: Family

## 2021-07-26 ENCOUNTER — Telehealth: Payer: Medicare Other

## 2021-07-27 ENCOUNTER — Telehealth: Payer: Self-pay | Admitting: Cardiology

## 2021-07-27 MED ORDER — CARVEDILOL 3.125 MG PO TABS: 3.1250 mg | ORAL_TABLET | Freq: Two times a day (BID) | ORAL | 3 refills | Status: DC

## 2021-07-27 NOTE — Telephone Encounter (Signed)
Pt c/o medication issue:  1. Name of Medication: carvedilol (COREG) 6.25 MG tablet  2. How are you currently taking this medication (dosage and times per day)? Take one tablet break in half take one half in the morning and the other in the evening   3. Are you having a reaction (difficulty breathing--STAT)? no  4. What is your medication issue? The way patient is currently taking her medication is working better for her and she would like for a new rx to be sent to her pharmacy with this new dosage as the previous one is too strong.. please advise

## 2021-07-27 NOTE — Telephone Encounter (Signed)
Phone Note from 07/15/21: Freada Bergeron, MD   HP    9:10 AM Note Completely agree. She may just need a  lower dose. Keep [place] posted on symptoms       I spoke with patient, BP running 133/62, HR 79-89. Dizziness has resolved.  I sent Coreg 3.125 mg BID to [facility]

## 2021-08-04 ENCOUNTER — Other Ambulatory Visit: Payer: Self-pay | Admitting: Family

## 2021-08-07 ENCOUNTER — Other Ambulatory Visit: Payer: Self-pay | Admitting: Family

## 2021-08-07 DIAGNOSIS — E1065 Type 1 diabetes mellitus with hyperglycemia: Secondary | ICD-10-CM

## 2021-09-09 ENCOUNTER — Other Ambulatory Visit: Payer: Self-pay | Admitting: Family

## 2021-09-10 ENCOUNTER — Other Ambulatory Visit: Payer: Self-pay | Admitting: Family

## 2021-09-14 ENCOUNTER — Other Ambulatory Visit: Payer: Self-pay | Admitting: Family

## 2021-09-14 DIAGNOSIS — I10 Essential (primary) hypertension: Secondary | ICD-10-CM

## 2021-09-15 ENCOUNTER — Encounter: Payer: Self-pay | Admitting: Family

## 2021-09-15 ENCOUNTER — Ambulatory Visit (INDEPENDENT_AMBULATORY_CARE_PROVIDER_SITE_OTHER): Payer: Medicare Other

## 2021-09-15 ENCOUNTER — Ambulatory Visit (INDEPENDENT_AMBULATORY_CARE_PROVIDER_SITE_OTHER): Payer: Medicare Other | Admitting: Family

## 2021-09-15 VITALS — BP 131/75 | HR 104 | Temp 98.5°F | Ht 64.0 in | Wt 191.0 lb

## 2021-09-15 DIAGNOSIS — E039 Hypothyroidism, unspecified: Secondary | ICD-10-CM | POA: Diagnosis not present

## 2021-09-15 DIAGNOSIS — I10 Essential (primary) hypertension: Secondary | ICD-10-CM

## 2021-09-15 DIAGNOSIS — Z780 Asymptomatic menopausal state: Secondary | ICD-10-CM

## 2021-09-15 DIAGNOSIS — J4520 Mild intermittent asthma, uncomplicated: Secondary | ICD-10-CM | POA: Diagnosis not present

## 2021-09-15 DIAGNOSIS — Z794 Long term (current) use of insulin: Secondary | ICD-10-CM | POA: Diagnosis not present

## 2021-09-15 DIAGNOSIS — E1165 Type 2 diabetes mellitus with hyperglycemia: Secondary | ICD-10-CM | POA: Diagnosis not present

## 2021-09-15 DIAGNOSIS — E1149 Type 2 diabetes mellitus with other diabetic neurological complication: Secondary | ICD-10-CM | POA: Diagnosis not present

## 2021-09-15 DIAGNOSIS — F411 Generalized anxiety disorder: Secondary | ICD-10-CM | POA: Diagnosis not present

## 2021-09-15 DIAGNOSIS — I340 Nonrheumatic mitral (valve) insufficiency: Secondary | ICD-10-CM | POA: Diagnosis not present

## 2021-09-15 DIAGNOSIS — M810 Age-related osteoporosis without current pathological fracture: Secondary | ICD-10-CM | POA: Diagnosis not present

## 2021-09-15 DIAGNOSIS — K2100 Gastro-esophageal reflux disease with esophagitis, without bleeding: Secondary | ICD-10-CM | POA: Diagnosis not present

## 2021-09-15 DIAGNOSIS — N1831 Chronic kidney disease, stage 3a: Secondary | ICD-10-CM

## 2021-09-15 DIAGNOSIS — E669 Obesity, unspecified: Secondary | ICD-10-CM

## 2021-09-15 DIAGNOSIS — M8588 Other specified disorders of bone density and structure, other site: Secondary | ICD-10-CM | POA: Diagnosis not present

## 2021-09-15 LAB — BAYER DCA HB A1C WAIVED: HB A1C (BAYER DCA - WAIVED): 5.8 % — ABNORMAL HIGH (ref 4.8–5.6)

## 2021-09-15 MED ORDER — FUROSEMIDE 40 MG PO TABS: 40.0000 mg | ORAL_TABLET | Freq: Every day | ORAL | 1 refills | Status: DC

## 2021-09-15 MED ORDER — BUSPIRONE HCL 5 MG PO TABS: 5.0000 mg | ORAL_TABLET | Freq: Two times a day (BID) | ORAL | 0 refills | Status: DC

## 2021-09-15 MED ORDER — LEVOTHYROXINE SODIUM 88 MCG PO TABS: 88.0000 ug | ORAL_TABLET | Freq: Every day | ORAL | 3 refills | Status: DC

## 2021-09-15 MED ORDER — METFORMIN HCL ER 750 MG PO TB24: 750.0000 mg | ORAL_TABLET | Freq: Every day | ORAL | 0 refills | Status: DC

## 2021-09-15 MED ORDER — ESOMEPRAZOLE MAGNESIUM 40 MG PO CPDR: DELAYED_RELEASE_CAPSULE | ORAL | 1 refills | Status: DC

## 2021-09-15 MED ORDER — BREZTRI AEROSPHERE 160-9-4.8 MCG/ACT IN AERO: 2.0000 | INHALATION_SPRAY | Freq: Two times a day (BID) | RESPIRATORY_TRACT | 11 refills | Status: DC

## 2021-09-15 MED ORDER — GABAPENTIN 100 MG PO CAPS: ORAL_CAPSULE | ORAL | 3 refills | Status: DC

## 2021-09-15 MED ORDER — ATORVASTATIN CALCIUM 20 MG PO TABS: 20.0000 mg | ORAL_TABLET | Freq: Every day | ORAL | 1 refills | Status: DC

## 2021-09-15 MED ORDER — HYDROXYZINE HCL 25 MG PO TABS: 25.0000 mg | ORAL_TABLET | Freq: Three times a day (TID) | ORAL | 1 refills | Status: DC

## 2021-09-15 NOTE — Patient Instructions (Signed)
Asthma, Adult ?Asthma is a long-term (chronic) condition that causes recurrent episodes in which the airways become tight and narrow. The airways are the passages that lead from the nose and mouth down into the lungs. Asthma episodes, also called asthma attacks, can cause coughing, wheezing, shortness of breath, and chest pain. The airways can also fill with mucus. During an attack, it can be difficult to breathe. Asthma attacks can range from minor to life threatening. ?Asthma cannot be cured, but medicines and lifestyle changes can help control it and treat acute attacks. ?What are the causes? ?This condition is believed to be caused by inherited (genetic) and environmental factors, but its exact cause is not known. ?There are many things that can bring on an asthma attack or make asthma symptoms worse (triggers). Asthma triggers are different for each person. Common triggers include: ?Mold. ?Dust. ?Cigarette smoke. ?Cockroaches. ?Things that can cause allergy symptoms (allergens), such as animal dander or pollen from trees or grass. ?Air pollutants such as household cleaners, wood smoke, smog, or Advertising account planner. ?Cold air, weather changes, and winds (which increase molds and pollen in the air). ?Strong emotional expressions such as crying or laughing hard. ?Stress. ?Certain medicines (such as aspirin) or types of medicines (such as beta-blockers). ?Sulfites in foods and drinks. Foods and drinks that may contain sulfites include dried fruit, potato chips, and sparkling grape juice. ?Infections or inflammatory conditions such as the flu, a cold, or inflammation of the nasal membranes (rhinitis). ?Gastroesophageal reflux disease (GERD). ?Exercise or strenuous activity. ?What are the signs or symptoms? ?Symptoms of this condition may occur right after asthma is triggered or many hours later. Symptoms include: ?Wheezing. This can sound like whistling when you breathe. ?Excessive nighttime or early morning  coughing. ?Frequent or severe coughing with a common cold. ?Chest tightness. ?Shortness of breath. ?Tiredness (fatigue) with minimal activity. ?How is this diagnosed? ?This condition is diagnosed based on: ?Your medical history. ?A physical exam. ?Tests, which may include: ?Lung function studies and pulmonary studies (spirometry). These tests can evaluate the flow of air in your lungs. ?Allergy tests. ?Imaging tests, such as X-rays. ?How is this treated? ?There is no cure for this condition, but treatment can help control your symptoms. Treatment for asthma usually involves: ?Identifying and avoiding your asthma triggers. ?Using medicines to control your symptoms. Generally, two types of medicines are used to treat asthma: ?Controller medicines. These help prevent asthma symptoms from occurring. They are usually taken every day. ?Fast-acting reliever or rescue medicines. These quickly relieve asthma symptoms by widening the narrow and tight airways. They are used as needed and provide short-term relief. ?Using supplemental oxygen. This may be needed during a severe episode. ?Using other medicines, such as: ?Allergy medicines, such as antihistamines, if your asthma attacks are triggered by allergens. ?Immune medicines (immunomodulators). These are medicines that help control the immune system. ?Creating an asthma action plan. An asthma action plan is a written plan for managing and treating your asthma attacks. This plan includes: ?A list of your asthma triggers and how to avoid them. ?Information about when medicines should be taken and when their dosage should be changed. ?Instructions about using a device called a peak flow meter. A peak flow meter measures how well the lungs are working and the severity of your asthma. It helps you monitor your condition. ?Follow these instructions at home: ?Controlling your home environment ?Control your home environment in the following ways to help avoid triggers and prevent  asthma attacks: ?Change your heating  and air conditioning filter regularly. ?Limit your use of fireplaces and wood stoves. ?Get rid of pests (such as roaches and mice) and their droppings. ?Throw away plants if you see mold on them. ?Clean floors and dust surfaces regularly. Use unscented cleaning products. ?Try to have someone else vacuum for you regularly. Stay out of rooms while they are being vacuumed and for a short while afterward. If you vacuum, use a dust mask from a hardware store, a double-layered or microfilter vacuum cleaner bag, or a vacuum cleaner with a HEPA filter. ?Replace carpet with wood, tile, or vinyl flooring. Carpet can trap dander and dust. ?Use allergy-proof pillows, mattress covers, and box spring covers. ?Keep your bedroom a trigger-free room. ?Avoid pets and keep windows closed when allergens are in the air. ?Wash beddings every week in hot water and dry them in a dryer. ?Use blankets that are made of polyester or cotton. ?Clean bathrooms and kitchens with bleach. If possible, have someone repaint the walls in these rooms with mold-resistant paint. Stay out of the rooms that are being cleaned and painted. ?Wash your hands often with soap and water. If soap and water are not available, use hand sanitizer. ?Do not allow anyone to smoke in your home. ?General instructions ?Take over-the-counter and prescription medicines only as told by your health care provider. ?Speak with your health care provider if you have questions about how or when to take the medicines. ?Make note if you are requiring more frequent dosages. ?Do not use any products that contain nicotine or tobacco, such as cigarettes and e-cigarettes. If you need help quitting, ask your health care provider. Also, avoid being exposed to secondhand smoke. ?Use a peak flow meter as told by your health care provider. Record and keep track of the readings. ?Understand and use the asthma action plan to help minimize, or stop an asthma  attack, without needing to seek medical care. ?Make sure you stay up to date on your yearly vaccinations as told by your health care provider. This may include vaccines for the flu and pneumonia. ?Avoid outdoor activities when allergen counts are high and when air quality is low. ?Wear a ski mask that covers your nose and mouth during outdoor winter activities. Exercise indoors on cold days if you can. ?Warm up before exercising, and take time for a cool-down period after exercise. ?Keep all follow-up visits as told by your health care provider. This is important. ?Where to find more information ?For information about asthma, turn to the Centers for Disease Control and Prevention at http://www.clark.net/ ?For air quality information, turn to [employer] at https://www.miller-reyes.info/ ?Contact a health care provider if: ?You have wheezing, shortness of breath, or a cough even while you are taking medicine to prevent attacks. ?The mucus you cough up (sputum) is thicker than usual. ?Your sputum changes from clear or white to yellow, green, gray, or bloody. ?Your medicines are causing side effects, such as a rash, itching, swelling, or trouble breathing. ?You need to use a reliever medicine more than 2-3 times a week. ?Your peak flow reading is still at 50-79% of your personal best after following your action plan for 1 hour. ?You have a fever. ?Get help right away if: ?You are getting worse and do not respond to treatment during an asthma attack. ?You are short of breath when at rest or when doing very little physical activity. ?You have difficulty eating, drinking, or talking. ?You have chest pain or tightness. ?You develop a fast heartbeat or  palpitations. ?You have a bluish color to your lips or fingernails. ?You are light-headed or dizzy, or you faint. ?Your peak flow reading is less than 50% of your personal best. ?You feel too tired to breathe normally. ?Summary ?Asthma is a long-term (chronic) condition that causes recurrent  episodes in which the airways become tight and narrow. These episodes can cause coughing, wheezing, shortness of breath, and chest pain. ?Asthma cannot be cured, but medicines and lifestyle changes can help control it and

## 2021-09-15 NOTE — Progress Notes (Signed)
? ?Subjective:  ? ? Patient ID: [name], female    DOB: 1950-08-26, 71 y.o.   MRN: [id number] ? ?Chief Complaint  ?Patient presents with  ? Medical Management of Chronic Issues  ?  Wants a change in [place]   ? ?Pt calls the office today for chronic follow up. She is followed Cardiologists annually for mitral insufficiency. ? ?Diabetes ?She presents for her follow-up diabetic visit. She has type 2 diabetes mellitus. Hypoglycemia symptoms include nervousness/anxiousness. Associated symptoms include foot paresthesias. Pertinent negatives for diabetes include no blurred vision. Symptoms are stable. Diabetic complications include heart disease and peripheral neuropathy. Pertinent negatives for diabetic complications include no CVA. Risk factors for coronary artery disease include dyslipidemia, diabetes mellitus, hypertension and sedentary lifestyle. She is following a generally healthy diet. Her overall blood glucose range is 110-130 mg/dl.  ?Hypertension ?This is a chronic problem. The current episode started more than 1 year ago. The problem has been resolved since onset. The problem is controlled. Associated symptoms include anxiety, malaise/fatigue and shortness of breath. Pertinent negatives include no blurred vision or peripheral edema. Risk factors for coronary artery disease include dyslipidemia, obesity, diabetes mellitus and sedentary lifestyle. The current treatment provides moderate improvement. There is no history of CVA or heart failure. Identifiable causes of hypertension include a thyroid problem.  ?Asthma ?She complains of cough, shortness of breath and wheezing. There is no sputum production. This is a chronic problem. The current episode started more than 1 year ago. The problem occurs intermittently. The problem has been waxing and waning. Associated symptoms include malaise/fatigue. Her symptoms are alleviated by rest and beta-agonist. She reports moderate improvement on treatment. Her symptoms  are not alleviated by rest. Her past medical history is significant for asthma.  ?Thyroid Problem ?Presents for follow-up visit. Symptoms include anxiety. Patient reports no constipation, dry skin or leg swelling. The symptoms have been stable. There is no history of heart failure.  ?Anxiety ?Presents for follow-up visit. Symptoms include excessive worry, nervous/anxious behavior and shortness of breath. Patient reports no irritability. Symptoms occur most days. The severity of symptoms is moderate.  ? ?Her past medical history is significant for anemia and asthma.  ?Anemia ?Presents for follow-up visit. Symptoms include malaise/fatigue. There is no history of heart failure.  ? ? ? ?Review of Systems  ?Constitutional:  Positive for malaise/fatigue. Negative for irritability.  ?Eyes:  Negative for blurred vision.  ?Respiratory:  Positive for cough, shortness of breath and wheezing. Negative for sputum production.   ?Gastrointestinal:  Negative for constipation.  ?Psychiatric/Behavioral:  The patient is nervous/anxious.   ?All other systems reviewed and are negative. ? ?   ?Objective:  ? Physical Exam ?Vitals reviewed.  ?Constitutional:   ?   General: She is not in acute distress. ?   Appearance: She is well-developed. She is obese.  ?HENT:  ?   Head: Normocephalic and atraumatic.  ?   Right Ear: Tympanic membrane normal.  ?   Left Ear: Tympanic membrane normal.  ?Eyes:  ?   Pupils: Pupils are equal, round, and reactive to light.  ?Neck:  ?   Thyroid: No thyromegaly.  ?Cardiovascular:  ?   Rate and Rhythm: Normal rate and regular rhythm.  ?   Heart sounds: Normal heart sounds. No murmur heard. ?Pulmonary:  ?   Effort: Pulmonary effort is normal. No respiratory distress.  ?   Breath sounds: Normal breath sounds. No wheezing.  ?Abdominal:  ?   General: Bowel sounds are normal. There  is no distension.  ?   Palpations: Abdomen is soft.  ?   Tenderness: There is no abdominal tenderness.  ?Musculoskeletal:     ?   General:  No tenderness. Normal range of motion.  ?   Cervical back: Normal range of motion and neck supple.  ?Skin: ?   General: Skin is warm and dry.  ?Neurological:  ?   Mental Status: She is alert and oriented to person, place, and time.  ?   Cranial Nerves: No cranial nerve deficit.  ?   Motor: Weakness present.  ?   Gait: Gait abnormal (using cane).  ?   Deep Tendon Reflexes: Reflexes are normal and symmetric.  ?Psychiatric:     ?   Behavior: Behavior normal.     ?   Thought Content: Thought content normal.     ?   Judgment: Judgment normal.  ? ? ? ? ?BP 131/75   Pulse (!) 104   Temp 98.5 ?F (36.9 ?C) (Temporal)   Ht _0  (1.626 m)   Wt 191 lb (86.6 kg)   SpO2 98%   BMI 32.79 kg/m?  ? ?   ?Assessment & Plan:  ?[name] comes in today with chief complaint of Medical Management of Chronic Issues (Wants a change in [place] ) ? ? ?Diagnosis and orders addressed: ? ?1. Essential (primary) hypertension ?- furosemide (LASIX) 40 MG tablet; Take 1 tablet (40 mg total) by mouth daily.  Dispense: 90 tablet; Refill: 1 ?- CMP14+EGFR ?- CBC with Differential/Platelet ? ?2. Gastroesophageal reflux disease with esophagitis without hemorrhage ?- esomeprazole (NEXIUM) 40 MG capsule; TAKE 1 CAPSULE BY MOUTH EVERY DAY  Dispense: 90 capsule; Refill: 1 ?- CMP14+EGFR ?- CBC with Differential/Platelet ? ?3. Generalized anxiety disorder ?- hydrOXYzine (ATARAX) 25 MG tablet; Take 1 tablet (25 mg total) by mouth 3 (three) times daily.  Dispense: 270 tablet; Refill: 1 ?- CMP14+EGFR ?- CBC with Differential/Platelet ? ?4. Other diabetic neurological complication associated with type 2 diabetes mellitus (HCC) ?- gabapentin (NEURONTIN) 100 MG capsule; TAKE 1 CAPSULE (100 MG TOTAL) BY MOUTH THREE TIMES DAILY.  Dispense: 90 capsule; Refill: 3 ?- CMP14+EGFR ?- CBC with Differential/Platelet ? ?5. Primary hypertension ?- CMP14+EGFR ?- CBC with Differential/Platelet ? ?6. Moderate mitral insufficiency ?- CMP14+EGFR ?- CBC with  Differential/Platelet ? ?7. Mild intermittent asthma, unspecified whether complicated ?Stop Breo and start Breztri  ?- CMP14+EGFR ?- CBC with Differential/Platelet ?- Budeson-Glycopyrrol-Formoterol (BREZTRI AEROSPHERE) 160-9-4.8 MCG/ACT AERO; Inhale 2 puffs into the lungs 2 (two) times daily.  Dispense: 10.7 g; Refill: 11 ? ?8. Hypothyroidism, unspecified type ?- CMP14+EGFR ?- CBC with Differential/Platelet ?- TSH ? ?9. Type 2 diabetes mellitus with hyperglycemia, with long-term current use of insulin ([place]) ?- Bayer DCA Hb A1c Waived ?- CMP14+EGFR ?- CBC with Differential/Platelet ? ?10. Stage 3a chronic kidney disease ([place]) ?- CMP14+EGFR ?- CBC with Differential/Platelet ? ?11. Obesity (BMI 30-39.9) ?- CMP14+EGFR ?- CBC with Differential/Platelet ? ?12. Post-menopausal ?- DG [facility] DEXA ? ? ?Labs pending ?Health Maintenance reviewed ?Diet and exercise encouraged ? ?Follow up plan: ?3 months  ? ? ?Evelina Dun, FNP ? ? ?

## 2021-09-16 LAB — CBC WITH DIFFERENTIAL/PLATELET
Basophils Absolute: 0 10*3/uL (ref 0.0–0.2)
Basos: 0 %
EOS (ABSOLUTE): 0.2 10*3/uL (ref 0.0–0.4)
Eos: 3 %
Hematocrit: 38.2 % (ref 34.0–46.6)
Hemoglobin: 12.4 g/dL (ref 11.1–15.9)
Immature Grans (Abs): 0 10*3/uL (ref 0.0–0.1)
Immature Granulocytes: 0 %
Lymphocytes Absolute: 1.7 10*3/uL (ref 0.7–3.1)
Lymphs: 23 %
MCH: 24.3 pg — ABNORMAL LOW (ref 26.6–33.0)
MCHC: 32.5 g/dL (ref 31.5–35.7)
MCV: 75 fL — ABNORMAL LOW (ref 79–97)
Monocytes Absolute: 0.4 10*3/uL (ref 0.1–0.9)
Monocytes: 6 %
Neutrophils Absolute: 5 10*3/uL (ref 1.4–7.0)
Neutrophils: 68 %
Platelets: 263 10*3/uL (ref 150–450)
RBC: 5.1 x10E6/uL (ref 3.77–5.28)
RDW: 12.2 % (ref 11.7–15.4)
WBC: 7.3 10*3/uL (ref 3.4–10.8)

## 2021-09-16 LAB — CMP14+EGFR
ALT: 9 IU/L (ref 0–32)
AST: 16 IU/L (ref 0–40)
Albumin/Globulin Ratio: 1.3 (ref 1.2–2.2)
Albumin: 4.2 g/dL (ref 3.8–4.8)
Alkaline Phosphatase: 177 IU/L — ABNORMAL HIGH (ref 44–121)
BUN/Creatinine Ratio: 15 (ref 12–28)
BUN: 19 mg/dL (ref 8–27)
Bilirubin Total: 0.2 mg/dL (ref 0.0–1.2)
CO2: 22 mmol/L (ref 20–29)
Calcium: 10.3 mg/dL (ref 8.7–10.3)
Chloride: 105 mmol/L (ref 96–106)
Creatinine, Ser: 1.27 mg/dL — ABNORMAL HIGH (ref 0.57–1.00)
Globulin, Total: 3.3 g/dL (ref 1.5–4.5)
Glucose: 183 mg/dL — ABNORMAL HIGH (ref 70–99)
Potassium: 5 mmol/L (ref 3.5–5.2)
Sodium: 145 mmol/L — ABNORMAL HIGH (ref 134–144)
Total Protein: 7.5 g/dL (ref 6.0–8.5)
eGFR: 46 mL/min/{1.73_m2} — ABNORMAL LOW (ref >59)

## 2021-09-16 LAB — TSH: TSH: 0.287 u[IU]/mL — ABNORMAL LOW (ref 0.450–4.500)

## 2021-09-18 ENCOUNTER — Other Ambulatory Visit: Payer: Self-pay | Admitting: Family

## 2021-09-18 MED ORDER — ALENDRONATE SODIUM 70 MG PO TABS: 70.0000 mg | ORAL_TABLET | ORAL | 11 refills | Status: DC

## 2021-09-18 MED ORDER — LEVOTHYROXINE SODIUM 75 MCG PO TABS: 75.0000 ug | ORAL_TABLET | Freq: Every day | ORAL | 11 refills | Status: DC

## 2021-09-27 ENCOUNTER — Ambulatory Visit (INDEPENDENT_AMBULATORY_CARE_PROVIDER_SITE_OTHER): Payer: Medicare Other | Admitting: Pharmacist

## 2021-09-27 DIAGNOSIS — N1831 Chronic kidney disease, stage 3a: Secondary | ICD-10-CM

## 2021-09-27 DIAGNOSIS — E1165 Type 2 diabetes mellitus with hyperglycemia: Secondary | ICD-10-CM

## 2021-10-05 NOTE — Progress Notes (Signed)
? ? ?Chronic Care Management ?Pharmacy Note ? ?09/27/2021 ?Name:  [name] MRN:  [id number] DOB:  05-13-1951 ? ?Summary: ?Diabetes: Goal on Track (progressing): YES. ?Controlled-A1C 5.8%--; current treatment: LANTUS 30-40 UNITS, METFORMIN, HOLDING HUMALOG ?HOLD HUMALOG  ?CONTINUE METFORMIN--cautious w/ GFR, would prefer patient be on SGLT2 but cost is an issue ?Patient not amenable to GLP1/has tried rybelsus, but prefers insulin/metformin ?Current glucose readings: fasting glucose: <130, post prandial glucose: UP TO 200 ?Is not using libre due to "falling off in the bed" ?Requested patient come in and we can work on solutions to it falling in ?Would like for patient to use [name] since she has experienced hyper & hypoglycemia in recent past ?Discussed meal planning options and Plate method for healthy eating ?Avoid sugary drinks and desserts ?Incorporate balanced protein, non starchy veggies, 1 serving of carbohydrate with each meal ?Increase water intake ?Increase physical activity as able ?Current exercise: n/a ?Recommended  drop humalog, continue lantus, continue metformin ? ?Subjective: ?[name] is an 71 y.o. year old female who is a primary patient of Sharion Balloon, FNP.  The CCM team was consulted for assistance with disease management and care coordination needs.   ? ?Engaged with patient by telephone for follow up visit in response to provider referral for pharmacy case management and/or care coordination services.  ? ?Consent to Services:  ?The patient was given information about Chronic Care Management services, agreed to services, and gave verbal consent prior to initiation of services.  Please see initial visit note for detailed documentation.  ? ?Patient Care Team: ?Sharion Balloon, FNP as PCP - General (Family Medicine) ?Garald Balding, MD as Consulting Physician (Orthopedic Surgery) ?Fields, Marga Melnick, MD (Inactive) as Consulting Physician (Gastroenterology) ?[place], [place]  (Ophthalmology) ?Lavera Guise, [facility] as Pharmacist (Family Medicine) ? ?Objective: ? ?Lab Results  ?Component Value Date  ? CREATININE 1.27 (H) 09/15/2021  ? CREATININE 1.18 (H) 05/17/2021  ? CREATININE 1.08 (H) 02/17/2021  ? ? ?Lab Results  ?Component Value Date  ? HGBA1C 5.8 (H) 09/15/2021  ? ?Last diabetic Eye exam:  ?Lab Results  ?Component Value Date/Time  ? HMDIABEYEEXA No Retinopathy 08/03/2020 12:00 AM  ?  ?Last diabetic Foot exam: No results found for: HMDIABFOOTEX  ? ?   ?Component Value Date/Time  ? CHOL 136 08/06/2019 1144  ? TRIG 99 08/06/2019 1144  ? HDL 36 (L) 08/06/2019 1144  ? CHOLHDL 3.8 08/06/2019 1144  ? [place] 81 08/06/2019 1144  ? ? ? ?  Latest Ref Rng & Units 09/15/2021  ? 10:37 AM 05/17/2021  ?  9:56 AM 02/17/2021  ? 10:57 AM  ?Hepatic Function  ?Total Protein 6.0 - 8.5 g/dL 7.5   6.8   6.8    ?Albumin 3.8 - 4.8 g/dL 4.2   4.0   4.0    ?AST 0 - 40 IU/L $Remov'16   14   18    'EvQltk$ ?ALT 0 - 32 IU/L $Remov'9   8   10    'ylhDgp$ ?Alk Phosphatase 44 - 121 IU/L 177   181   169    ?Total Bilirubin 0.0 - 1.2 mg/dL 0.2   0.2   0.3    ? ? ?Lab Results  ?Component Value Date/Time  ? TSH 0.287 (L) 09/15/2021 10:37 AM  ? TSH 0.561 05/17/2021 09:56 AM  ? ? ? ?  Latest Ref Rng & Units 09/15/2021  ? 10:37 AM 05/17/2021  ?  9:56 AM 02/17/2021  ? 10:57 AM  ?  CBC  ?WBC 3.4 - 10.8 x10E3/uL 7.3   7.1   6.7    ?Hemoglobin 11.1 - 15.9 g/dL 12.4   12.5   11.5    ?Hematocrit 34.0 - 46.6 % 38.2   38.8   35.4    ?Platelets 150 - 450 x10E3/uL 263   260   237    ? ? ?No results found for: VD25OH ? ?Clinical ASCVD: No  ?The 10-year ASCVD risk score (Arnett DK, et al., 2019) is: 18.8% ?  Values used to calculate the score: ?    Age: 37 years ?    Sex: Female ?    Is Non-Hispanic African American: Yes ?    Diabetic: Yes ?    Tobacco smoker: No ?    Systolic Blood Pressure: 062 mmHg ?    Is BP treated: Yes ?    HDL Cholesterol: 36 mg/dL ?    Total Cholesterol: 136 mg/dL   ? ?Other: (CHADS2VASc if Afib, PHQ9 if depression, MMRC or CAT for COPD, ACT,  DEXA) ? ?Social History  ? ?Tobacco Use  ?Smoking Status Never  ?Smokeless Tobacco Never  ? ?BP Readings from Last 3 Encounters:  ?09/15/21 131/75  ?05/25/21 (!) 140/92  ?05/17/21 135/89  ? ?Pulse Readings from Last 3 Encounters:  ?09/15/21 (!) 104  ?05/25/21 85  ?05/17/21 82  ? ?Wt Readings from Last 3 Encounters:  ?09/15/21 191 lb (86.6 kg)  ?05/25/21 188 lb 9.6 oz (85.5 kg)  ?05/17/21 183 lb 3.2 oz (83.1 kg)  ? ? ?Assessment: Review of patient past medical history, allergies, medications, health status, including review of consultants reports, laboratory and other test data, was performed as part of comprehensive evaluation and provision of chronic care management services.  ? ?SDOH:  (Social Determinants of Health) assessments and interventions performed:  ? ? ?CCM Care Plan ? ?Allergies  ?Allergen Reactions  ? Other Itching, Nausea Only and Other (See Comments)  ?  Acidic foods cause stomach upset and itching  ? Aspirin Other (See Comments)  ?  Can only take 81 - full strength will give acid reflux  ? Jardiance [Empagliflozin]   ?  Recurrent yeast infections  ? Allopurinol Rash  ? Penicillins Rash  ?  Has patient had a PCN reaction causing immediate rash, facial/tongue/throat swelling, SOB or lightheadedness with hypotension: unkn ?Has patient had a PCN reaction causing severe rash involving mucus membranes or skin necrosis: unkn ?Has patient had a PCN reaction that required hospitalization: unkn ?Has patient had a PCN reaction occurring within the last 10 years: unkn ?If all of the above answers are "NO", then may proceed with Cephalosporin use. ?  ? Sulfa Antibiotics Rash  ? ? ?Medications Reviewed Today   ? ? Reviewed by Sharion Balloon, FNP (Family Nurse Practitioner) on 09/15/21 at 1003  Med List Status: <None>  ? ?Medication Order Taking? Sig Documenting Provider Last Dose Status Informant  ?albuterol (VENTOLIN HFA) 108 (90 Base) MCG/ACT inhaler [id number] Yes TAKE 2 PUFFS BY MOUTH EVERY 6 HOURS AS NEEDED  FOR WHEEZE OR SHORTNESS OF BREATH Hawks, Theador Hawthorne, FNP Taking Active   ?atorvastatin (LIPITOR) 20 MG tablet [id number]  Take 1 tablet (20 mg total) by mouth daily. Sharion Balloon, FNP  Active   ?Budeson-Glycopyrrol-Formoterol (BREZTRI AEROSPHERE) 160-9-4.8 MCG/ACT AERO [id number] Yes Inhale 2 puffs into the lungs 2 (two) times daily. Evelina Dun A, FNP  Active   ?busPIRone (BUSPAR) 5 MG tablet [id number]  Take 1-2 tablets (5-10 mg total) by  mouth 2 (two) times daily. Evelina Dun A, FNP  Active   ?carvedilol (COREG) 3.125 MG tablet [id number] Yes Take 1 tablet (3.125 mg total) by mouth 2 (two) times daily. Freada Bergeron, MD Taking Active   ?cetirizine (ZYRTEC) 10 MG tablet [id number] Yes Take 20 mg by mouth daily. [provider] Taking Active Self  ?cloNIDine (CATAPRES) 0.1 MG tablet [id number] Yes TAKE 1 TABLET BY MOUTH EVERY DAY Hawks, Theador Hawthorne, FNP Taking Active   ?Continuous Blood Gluc Sensor (FREESTYLE LIBRE 2 SENSOR) MISC [id number] Yes by Does not apply route. [provider] Taking Active   ?         ?Med Note Lavera Guise   Tue Mar 15, 2021  1:17 PM) Patient doesn't use  ?docusate sodium (COLACE) 100 MG capsule [id number] Yes Take 100 mg by mouth 2 (two) times daily. [provider] Taking Active Self  ?esomeprazole (NEXIUM) 40 MG capsule [id number]  TAKE 1 CAPSULE BY MOUTH EVERY DAY Hawks, Christy A, FNP  Active   ?fluticasone (FLONASE) 50 MCG/ACT nasal spray [id number] Yes Place 1 spray into both nostrils 2 (two) times daily as needed for allergies or rhinitis. [provider] Taking Active Self  ?furosemide (LASIX) 40 MG tablet [id number]  Take 1 tablet (40 mg total) by mouth daily. Evelina Dun A, FNP  Active   ?gabapentin (NEURONTIN) 100 MG capsule [id number]  TAKE 1 CAPSULE (100 MG TOTAL) BY MOUTH THREE TIMES DAILY. Evelina Dun A, FNP  Active   ?glucose blood (ONE TOUCH ULTRA TEST) test strip [id number] Yes USE TO TEST BLOOD SUGAR 3 TIMES DAILY AS  DIRECTED. DX: E11.65 Sharion Balloon, FNP Taking Active   ?HUMALOG 100 UNIT/ML injection [id number] Yes INJECT 0.04-0.1 MLS (4-10 UNITS TOTAL) INTO THE SKIN 3 (THREE) TIMES DAILY WITH MEALS. SLIDING SCALE [place], [place]

## 2021-10-05 NOTE — Patient Instructions (Addendum)
Visit Information ? ?Following are the goals we discussed today:  ?Summary: ?Diabetes: Goal on Track (progressing): YES. ?Controlled-A1C 5.8%--; current treatment: LANTUS 30-40 UNITS, METFORMIN, HOLDING HUMALOG ?HOLD HUMALOG  ?CONTINUE METFORMIN--cautious w/ GFR, would prefer patient be on SGLT2 but cost is an issue ?Patient not amenable to GLP1/has tried rybelsus, but prefers insulin/metformin ?Current glucose readings: fasting glucose: <130, post prandial glucose: UP TO 200 ?Is not using libre due to "falling off in the bed" ?Requested patient come in and we can work on solutions to it falling in ?Would like for patient to use [name] since she has experienced hyper & hypoglycemia in recent past ?Discussed meal planning options and Plate method for healthy eating ?Avoid sugary drinks and desserts ?Incorporate balanced protein, non starchy veggies, 1 serving of carbohydrate with each meal ?Increase water intake ?Increase physical activity as able ?Current exercise: n/a ?Recommended  drop humalog, continue lantus, continue metformin ? ?Plan: Telephone follow up appointment with care management team member scheduled for:  3 months ? ?Signature ? ?Regina Eck, PharmD, BCPS ?Clinical Pharmacist, [facility] ?[place]  [place] Phone [phone] ? ? ?Please call the care guide team at [phone] if you need to cancel or reschedule your appointment.  ? ?The patient verbalized understanding of instructions, educational materials, and care plan provided today and declined offer to receive copy of patient instructions, educational materials, and care plan.  ? ?

## 2021-10-09 DIAGNOSIS — Z794 Long term (current) use of insulin: Secondary | ICD-10-CM

## 2021-10-09 DIAGNOSIS — E1165 Type 2 diabetes mellitus with hyperglycemia: Secondary | ICD-10-CM

## 2021-10-09 DIAGNOSIS — N1831 Chronic kidney disease, stage 3a: Secondary | ICD-10-CM

## 2021-10-16 ENCOUNTER — Other Ambulatory Visit: Payer: Self-pay | Admitting: Family

## 2021-10-20 ENCOUNTER — Other Ambulatory Visit: Payer: Self-pay | Admitting: Family

## 2021-10-26 ENCOUNTER — Telehealth: Payer: Self-pay | Admitting: *Deleted

## 2021-10-26 NOTE — Telephone Encounter (Signed)
Fax from [facility] ?RE: Quinapril 5 mg 2 tabs QD ?Note: product backorder/unavailable: cannot get in stock ?Please prescribe alternative ?

## 2021-10-28 MED ORDER — LISINOPRIL 10 MG PO TABS: 10.0000 mg | ORAL_TABLET | Freq: Every day | ORAL | 3 refills | Status: DC

## 2021-10-28 NOTE — Telephone Encounter (Signed)
Quinapril changed to Lisinopril 10 mg. New rx sent to pharmacy.

## 2021-10-28 NOTE — Addendum Note (Signed)
Addended by: Evelina Dun A on: 10/28/2021 09:30 AM   Modules accepted: Orders

## 2021-11-03 ENCOUNTER — Other Ambulatory Visit: Payer: Self-pay | Admitting: Family

## 2021-11-03 DIAGNOSIS — E1065 Type 1 diabetes mellitus with hyperglycemia: Secondary | ICD-10-CM

## 2021-11-14 ENCOUNTER — Other Ambulatory Visit: Payer: Self-pay | Admitting: Family

## 2021-11-14 DIAGNOSIS — E1065 Type 1 diabetes mellitus with hyperglycemia: Secondary | ICD-10-CM

## 2021-11-17 ENCOUNTER — Encounter: Payer: Self-pay | Admitting: Family

## 2021-11-17 ENCOUNTER — Ambulatory Visit (INDEPENDENT_AMBULATORY_CARE_PROVIDER_SITE_OTHER): Payer: Medicare Other | Admitting: Family

## 2021-11-17 VITALS — BP 140/80 | HR 88 | Ht 64.0 in | Wt 189.0 lb

## 2021-11-17 DIAGNOSIS — K219 Gastro-esophageal reflux disease without esophagitis: Secondary | ICD-10-CM | POA: Diagnosis not present

## 2021-11-17 DIAGNOSIS — E039 Hypothyroidism, unspecified: Secondary | ICD-10-CM

## 2021-11-17 DIAGNOSIS — R300 Dysuria: Secondary | ICD-10-CM | POA: Diagnosis not present

## 2021-11-17 DIAGNOSIS — J4520 Mild intermittent asthma, uncomplicated: Secondary | ICD-10-CM

## 2021-11-17 DIAGNOSIS — M810 Age-related osteoporosis without current pathological fracture: Secondary | ICD-10-CM | POA: Diagnosis not present

## 2021-11-17 DIAGNOSIS — I10 Essential (primary) hypertension: Secondary | ICD-10-CM | POA: Diagnosis not present

## 2021-11-17 LAB — URINALYSIS, COMPLETE
Bilirubin, UA: NEGATIVE
Glucose, UA: NEGATIVE
Ketones, UA: NEGATIVE
Leukocytes,UA: NEGATIVE
Nitrite, UA: NEGATIVE
Protein,UA: NEGATIVE
RBC, UA: NEGATIVE
Specific Gravity, UA: 1.025 (ref 1.005–1.030)
Urobilinogen, Ur: 0.2 mg/dL (ref 0.2–1.0)
pH, UA: 5.5 (ref 5.0–7.5)

## 2021-11-17 MED ORDER — FAMOTIDINE 20 MG PO TABS: 20.0000 mg | ORAL_TABLET | Freq: Two times a day (BID) | ORAL | 2 refills | Status: DC

## 2021-11-17 NOTE — Progress Notes (Signed)
Subjective:    Patient ID: [name], female    DOB: [birth year], 71 y.o.   MRN: [id number]  Chief Complaint  Patient presents with   Follow-up   Hypertension    It has been doing good , checking it every other day    Asthma    Pt states this has been really bad , allergies are bothering her really bad    Gastroesophageal Reflux    Pt states this has gotten worse once she started taking the bone density meds    Pt presents the office today to follow up on abnormal TSH. We decreased her levothyroxine to 75 mcg.   We also stopped her Memory Dance and started her on Breztri. Reports this has helped her breathing and feels like she can walk farther without "giving out of breath".   States since starting Fosamax her GERD has been increased.   Pt requesting we check her urine today. Intermittent dysuria.  Hypertension This is a chronic problem. The current episode started more than 1 year ago. The problem has been waxing and waning since onset. Associated symptoms include shortness of breath. Pertinent negatives include no malaise/fatigue or peripheral edema. Risk factors for coronary artery disease include dyslipidemia, obesity and sedentary lifestyle. The current treatment provides moderate improvement. Identifiable causes of hypertension include a thyroid problem.  Asthma She complains of cough, shortness of breath and wheezing. There is no hoarse voice. This is a chronic problem. The current episode started more than 1 year ago. The problem occurs intermittently. The problem has been waxing and waning. Associated symptoms include heartburn. Pertinent negatives include no malaise/fatigue. Her symptoms are aggravated by pollen. Her symptoms are alleviated by rest [name]). She reports moderate improvement on treatment. Her past medical history is significant for asthma.  Gastroesophageal Reflux She complains of coughing, heartburn and wheezing. She reports no hoarse voice. This is a chronic  problem. The current episode started more than 1 year ago. The problem occurs occasionally. Associated symptoms include fatigue. Risk factors include obesity. She has tried a PPI for the symptoms. The treatment provided mild relief.  Thyroid Problem Presents for follow-up visit. Symptoms include fatigue. Patient reports no depressed mood, hoarse voice or tremors. The symptoms have been stable.      Review of Systems  Constitutional:  Positive for fatigue. Negative for malaise/fatigue.  HENT:  Negative for hoarse voice.   Respiratory:  Positive for cough, shortness of breath and wheezing.   Gastrointestinal:  Positive for heartburn.  Neurological:  Negative for tremors.  All other systems reviewed and are negative.      Objective:   Physical Exam Vitals reviewed.  Constitutional:      General: She is not in acute distress.    Appearance: She is well-developed. She is obese.  HENT:     Head: Normocephalic and atraumatic.     Right Ear: Tympanic membrane normal.     Left Ear: Tympanic membrane normal.  Eyes:     Pupils: Pupils are equal, round, and reactive to light.  Neck:     Thyroid: No thyromegaly.  Cardiovascular:     Rate and Rhythm: Normal rate and regular rhythm.     Heart sounds: Normal heart sounds. No murmur heard. Pulmonary:     Effort: Pulmonary effort is normal. No respiratory distress.     Breath sounds: Normal breath sounds. No wheezing.  Abdominal:     General: Bowel sounds are normal. There is no distension.  Palpations: Abdomen is soft.     Tenderness: There is no abdominal tenderness.  Musculoskeletal:        General: No tenderness. Normal range of motion.     Cervical back: Normal range of motion and neck supple.  Skin:    General: Skin is warm and dry.  Neurological:     Mental Status: She is alert and oriented to person, place, and time.     Cranial Nerves: No cranial nerve deficit.     Deep Tendon Reflexes: Reflexes are normal and symmetric.   Psychiatric:        Behavior: Behavior normal.        Thought Content: Thought content normal.        Judgment: Judgment normal.       BP 140/80   Pulse 88   Ht _0  (1.626 m)   Wt 189 lb (85.7 kg)   SpO2 100%   BMI 32.44 kg/m      Assessment & Plan:  [name] comes in today with chief complaint of Follow-up, Hypertension (It has been doing good , checking it every other day ), Asthma (Pt states this has been really bad , allergies are bothering her really bad ), and Gastroesophageal Reflux (Pt states this has gotten worse once she started taking the bone density meds )   Diagnosis and orders addressed:  1. Primary hypertension - CMP14+EGFR  2. Hypothyroidism, unspecified type Labs pending  - CMP14+EGFR - TSH  3. Age-related osteoporosis without current pathological fracture - CMP14+EGFR  4. Mild intermittent asthma, unspecified whether complicated Continue Breztri  - CMP14+EGFR  5. Gastroesophageal reflux disease, unspecified whether esophagitis present Continue Nexium and start Pepcid 20 mg BID  - famotidine (PEPCID) 20 MG tablet; Take 1 tablet (20 mg total) by mouth 2 (two) times daily.  Dispense: 180 tablet; Refill: 2 - CMP14+EGFR  6. Dysuria - Urinalysis, Complete   Labs pending Health Maintenance reviewed Diet and exercise encouraged  Follow up plan: 3 months    Evelina Dun, FNP

## 2021-11-17 NOTE — Patient Instructions (Signed)
Gastroesophageal Reflux Disease, Adult Gastroesophageal reflux (GER) happens when acid from the stomach flows up into the tube that connects the mouth and the stomach (esophagus). Normally, food travels down the esophagus and stays in the stomach to be digested. However, when a person has GER, food and stomach acid sometimes move back up into the esophagus. If this becomes a more serious problem, the person may be diagnosed with a disease called gastroesophageal reflux disease (GERD). GERD occurs when the reflux: Happens often. Causes frequent or severe symptoms. Causes problems such as damage to the esophagus. When stomach acid comes in contact with the esophagus, the acid may cause inflammation in the esophagus. Over time, GERD may create small holes (ulcers) in the lining of the esophagus. What are the causes? This condition is caused by a problem with the muscle between the esophagus and the stomach (lower esophageal sphincter, or LES). Normally, the LES muscle closes after food passes through the esophagus to the stomach. When the LES is weakened or abnormal, it does not close properly, and that allows food and stomach acid to go back up into the esophagus. The LES can be weakened by certain dietary substances, medicines, and medical conditions, including: Tobacco use. Pregnancy. Having a hiatal hernia. Alcohol use. Certain foods and beverages, such as coffee, chocolate, onions, and peppermint. What increases the risk? You are more likely to develop this condition if you: Have an increased body weight. Have a connective tissue disorder. Take NSAIDs, such as ibuprofen. What are the signs or symptoms? Symptoms of this condition include: Heartburn. Difficult or painful swallowing and the feeling of having a lump in the throat. A bitter taste in the mouth. Bad breath and having a large amount of saliva. Having an upset or bloated stomach and belching. Chest pain. Different conditions can  cause chest pain. Make sure you see your health care provider if you experience chest pain. Shortness of breath or wheezing. Ongoing (chronic) cough or a nighttime cough. Wearing away of tooth enamel. Weight loss. How is this diagnosed? This condition may be diagnosed based on a medical history and a physical exam. To determine if you have mild or severe GERD, your health care provider may also monitor how you respond to treatment. You may also have tests, including: A test to examine your stomach and esophagus with a small camera (endoscopy). A test that measures the acidity level in your esophagus. A test that measures how much pressure is on your esophagus. A barium swallow or modified barium swallow test to show the shape, size, and functioning of your esophagus. How is this treated? Treatment for this condition may vary depending on how severe your symptoms are. Your health care provider may recommend: Changes to your diet. Medicine. Surgery. The goal of treatment is to help relieve your symptoms and to prevent complications. Follow these instructions at home: Eating and drinking  Follow a diet as recommended by your health care provider. This may involve avoiding foods and drinks such as: Coffee and tea, with or without caffeine. Drinks that contain alcohol. Energy drinks and sports drinks. Carbonated drinks or sodas. Chocolate and cocoa. Peppermint and mint flavorings. Garlic and onions. Horseradish. Spicy and acidic foods, including peppers, chili powder, curry powder, vinegar, hot sauces, and barbecue sauce. Citrus fruit juices and citrus fruits, such as oranges, lemons, and limes. Tomato-based foods, such as red sauce, chili, salsa, and pizza with red sauce. Fried and fatty foods, such as donuts, french fries, potato chips, and high-fat dressings.   High-fat meats, such as hot dogs and fatty cuts of red and white meats, such as rib eye steak, sausage, ham, and  bacon. High-fat dairy items, such as whole milk, butter, and cream cheese. Eat small, frequent meals instead of large meals. Avoid drinking large amounts of liquid with your meals. Avoid eating meals during the 2-3 hours before bedtime. Avoid lying down right after you eat. Do not exercise right after you eat. Lifestyle  Do not use any products that contain nicotine or tobacco. These products include cigarettes, chewing tobacco, and vaping devices, such as e-cigarettes. If you need help quitting, ask your health care provider. Try to reduce your stress by using methods such as yoga or meditation. If you need help reducing stress, ask your health care provider. If you are overweight, reduce your weight to an amount that is healthy for you. Ask your health care provider for guidance about a safe weight loss goal. General instructions Pay attention to any changes in your symptoms. Take over-the-counter and prescription medicines only as told by your health care provider. Do not take aspirin, ibuprofen, or other NSAIDs unless your health care provider told you to take these medicines. Wear loose-fitting clothing. Do not wear anything tight around your waist that causes pressure on your abdomen. Raise (elevate) the head of your bed about 6 inches (15 cm). You can use a wedge to do this. Avoid bending over if this makes your symptoms worse. Keep all follow-up visits. This is important. Contact a health care provider if: You have: New symptoms. Unexplained weight loss. Difficulty swallowing or it hurts to swallow. Wheezing or a persistent cough. A hoarse voice. Your symptoms do not improve with treatment. Get help right away if: You have sudden pain in your arms, neck, jaw, teeth, or back. You suddenly feel sweaty, dizzy, or light-headed. You have chest pain or shortness of breath. You vomit and the vomit is green, yellow, or black, or it looks like blood or coffee grounds. You faint. You  have stool that is red, bloody, or black. You cannot swallow, drink, or eat. These symptoms may represent a serious problem that is an emergency. Do not wait to see if the symptoms will go away. Get medical help right away. Call your local emergency services (911 in the [place] Do not drive yourself to the hospital. Summary Gastroesophageal reflux happens when acid from the stomach flows up into the esophagus. GERD is a disease in which the reflux happens often, causes frequent or severe symptoms, or causes problems such as damage to the esophagus. Treatment for this condition may vary depending on how severe your symptoms are. Your health care provider may recommend diet and lifestyle changes, medicine, or surgery. Contact a health care provider if you have new or worsening symptoms. Take over-the-counter and prescription medicines only as told by your health care provider. Do not take aspirin, ibuprofen, or other NSAIDs unless your health care provider told you to do so. Keep all follow-up visits as told by your health care provider. This is important. This information is not intended to replace advice given to you by your health care provider. Make sure you discuss any questions you have with your health care provider. Document Revised: 12/08/2019 Document Reviewed: 12/08/2019 Elsevier Patient Education  2023 [employer]  

## 2021-11-18 ENCOUNTER — Telehealth: Payer: Self-pay | Admitting: Emergency Medicine

## 2021-11-18 LAB — CMP14+EGFR
ALT: 10 IU/L (ref 0–32)
AST: 13 IU/L (ref 0–40)
Albumin/Globulin Ratio: 1.7 (ref 1.2–2.2)
Albumin: 4.4 g/dL (ref 3.8–4.8)
Alkaline Phosphatase: 168 IU/L — ABNORMAL HIGH (ref 44–121)
BUN/Creatinine Ratio: 17 (ref 12–28)
BUN: 21 mg/dL (ref 8–27)
Bilirubin Total: 0.2 mg/dL (ref 0.0–1.2)
CO2: 25 mmol/L (ref 20–29)
Calcium: 10.8 mg/dL — ABNORMAL HIGH (ref 8.7–10.3)
Chloride: 103 mmol/L (ref 96–106)
Creatinine, Ser: 1.21 mg/dL — ABNORMAL HIGH (ref 0.57–1.00)
Globulin, Total: 2.6 g/dL (ref 1.5–4.5)
Glucose: 138 mg/dL — ABNORMAL HIGH (ref 70–99)
Potassium: 5.1 mmol/L (ref 3.5–5.2)
Sodium: 141 mmol/L (ref 134–144)
Total Protein: 7 g/dL (ref 6.0–8.5)
eGFR: 49 mL/min/{1.73_m2} — ABNORMAL LOW (ref >59)

## 2021-11-18 LAB — TSH: TSH: 2.16 u[IU]/mL (ref 0.450–4.500)

## 2021-11-18 MED ORDER — LISINOPRIL 20 MG PO TABS: 20.0000 mg | ORAL_TABLET | Freq: Every day | ORAL | 3 refills | Status: DC

## 2021-11-18 NOTE — Telephone Encounter (Signed)
Increase Lisinopril to 20 mg from 10 mg. Needs follow up in 2 weeks.

## 2021-11-18 NOTE — Telephone Encounter (Signed)
Aware - appt made

## 2021-11-18 NOTE — Telephone Encounter (Signed)
Patient calling this morning with Blood pressure for 177/102( left arm) and 186/105 ( right arm) Patient states that she is taking the Lisinopril '10mg'$  once per day. She is concerned with her blood pressure being this high. Should she increase her medication.   Please advise.   Doloris Hall, RN

## 2021-11-23 ENCOUNTER — Ambulatory Visit: Payer: Medicare Other

## 2021-11-24 ENCOUNTER — Other Ambulatory Visit: Payer: Self-pay | Admitting: Family

## 2021-11-24 ENCOUNTER — Ambulatory Visit (INDEPENDENT_AMBULATORY_CARE_PROVIDER_SITE_OTHER): Payer: Medicare Other

## 2021-11-24 ENCOUNTER — Telehealth: Payer: Self-pay

## 2021-11-24 VITALS — BP 147/88 | HR 97 | Wt 189.0 lb

## 2021-11-24 DIAGNOSIS — Z0000 Encounter for general adult medical examination without abnormal findings: Secondary | ICD-10-CM

## 2021-11-24 DIAGNOSIS — I10 Essential (primary) hypertension: Secondary | ICD-10-CM

## 2021-11-24 MED ORDER — LISINOPRIL 40 MG PO TABS: 40.0000 mg | ORAL_TABLET | Freq: Every day | ORAL | 3 refills | Status: DC

## 2021-11-24 NOTE — Progress Notes (Signed)
Subjective:   [name] is a 71 y.o. female who presents for Medicare Annual (Subsequent) preventive examination.  Virtual Visit via Telephone Note  I connected with  [name] on 11/24/21 at  2:00 PM EDT by telephone and verified that I am speaking with the correct person using two identifiers.  Location: Patient: Home Provider: [facility] Persons participating in the virtual visit: patient/Nurse Health Advisor   I discussed the limitations, risks, security and privacy concerns of performing an evaluation and management service by telephone and the availability of in person appointments. The patient expressed understanding and agreed to proceed.  Interactive audio and video telecommunications were attempted between this nurse and patient, however failed, due to patient having technical difficulties OR patient did not have access to video capability.  We continued and completed visit with audio only.  Some vital signs may be absent or patient reported.   Abem Shaddix E Javonda Suh, LPN   Review of Systems     Cardiac Risk Factors include: advanced age (>71mn, >>24women);diabetes mellitus;dyslipidemia;hypertension;obesity (BMI >30kg/m2);sedentary lifestyle;Other (see comment), Risk factor comments: mitral insufficiency     Objective:    Today's Vitals   11/24/21 1357 11/24/21 1358  BP: (!) 147/88   Pulse: 97   Weight: 189 lb (85.7 kg)   PainSc:  8    Body mass index is 32.44 kg/m.     11/24/2021    2:05 PM 11/22/2020    1:44 PM 11/20/2019   10:09 AM 04/29/2019    6:52 AM 04/25/2019    8:48 AM 11/19/2018    2:02 PM 04/02/2018    7:03 AM  Advanced Directives  Does Patient Have a Medical Advance Directive? No No No No No No No  Would patient like information on creating a medical advance directive? No - Patient declined No - Patient declined No - Patient declined No - Patient declined Yes (MAU/Ambulatory/Procedural Areas - Information given) No - Patient declined No - Patient  declined    Current Medications (verified) Outpatient Encounter Medications as of 11/24/2021  Medication Sig   albuterol (VENTOLIN HFA) 108 (90 Base) MCG/ACT inhaler TAKE 2 PUFFS BY MOUTH EVERY 6 HOURS AS NEEDED FOR WHEEZE OR SHORTNESS OF BREATH   alendronate (FOSAMAX) 70 MG tablet Take 1 tablet (70 mg total) by mouth every 7 (seven) days. Take with a full glass of water on an empty stomach.   atorvastatin (LIPITOR) 20 MG tablet Take 1 tablet (20 mg total) by mouth daily.   Budeson-Glycopyrrol-Formoterol (BREZTRI AEROSPHERE) 160-9-4.8 MCG/ACT AERO Inhale 2 puffs into the lungs 2 (two) times daily.   busPIRone (BUSPAR) 5 MG tablet Take 1-2 tablets (5-10 mg total) by mouth 2 (two) times daily.   cetirizine (ZYRTEC) 10 MG tablet Take 20 mg by mouth daily.   cloNIDine (CATAPRES) 0.1 MG tablet TAKE 1 TABLET BY MOUTH EVERY DAY   docusate sodium (COLACE) 100 MG capsule Take 100 mg by mouth 2 (two) times daily.   esomeprazole (NEXIUM) 40 MG capsule TAKE 1 CAPSULE BY MOUTH EVERY DAY   famotidine (PEPCID) 20 MG tablet Take 1 tablet (20 mg total) by mouth 2 (two) times daily.   fluticasone (FLONASE) 50 MCG/ACT nasal spray Place 1 spray into both nostrils 2 (two) times daily as needed for allergies or rhinitis.   furosemide (LASIX) 40 MG tablet Take 1 tablet (40 mg total) by mouth daily.   gabapentin (NEURONTIN) 100 MG capsule TAKE 1 CAPSULE (100 MG TOTAL) BY MOUTH THREE TIMES DAILY.  glucose blood (ONE TOUCH ULTRA TEST) test strip USE TO TEST BLOOD SUGAR 3 TIMES DAILY AS DIRECTED. DX: E11.65   hydrOXYzine (ATARAX) 25 MG tablet Take 1 tablet (25 mg total) by mouth 3 (three) times daily.   insulin glargine (LANTUS) 100 UNIT/ML injection Inject 0.3-0.4 mLs (30-40 Units total) into the skin daily.   Insulin Syringe-Needle U-100 (BD INSULIN SYRINGE U/F) 31G X 5/16" 0.5 ML MISC Use to inject lantus and novolog daily dx E11.65   levothyroxine (SYNTHROID) 75 MCG tablet Take 1 tablet (75 mcg total) by mouth  daily.   lisinopril (ZESTRIL) 20 MG tablet Take 1 tablet (20 mg total) by mouth daily.   magnesium hydroxide (MILK OF MAGNESIA) 400 MG/5ML suspension Take 60 mLs by mouth See admin instructions. Every 3 days - [name]   metFORMIN (GLUCOPHAGE-XR) 750 MG 24 hr tablet Take 1 tablet (750 mg total) by mouth daily with breakfast.   montelukast (SINGULAIR) 10 MG tablet TAKE 1 TABLET BY MOUTH EVERY DAY IN THE EVENING   Multiple Vitamins-Minerals (MULTIVITAMIN WITH MINERALS) tablet Take 1 tablet by mouth daily. Centrum Silver   Propylene Glycol (SYSTANE BALANCE OP) Place 1 drop into both eyes daily as needed (Dry eyes).   carvedilol (COREG) 3.125 MG tablet Take 1 tablet (3.125 mg total) by mouth 2 (two) times daily.   [DISCONTINUED] Continuous Blood Gluc Sensor (FREESTYLE LIBRE 2 SENSOR) MISC by Does not apply route.   No facility-administered encounter medications on file as of 11/24/2021.    Allergies (verified) Other, Aspirin, Jardiance [empagliflozin], Allopurinol, Penicillins, and Sulfa antibiotics   History: Past Medical History:  Diagnosis Date   Allergic rhinitis    Allergy    Anemia    Ankle swelling    Anxiety    takes Xanax daily    Arthritis    Asthma    Blurred vision, bilateral    Chronic kidney disease    Depression    takes Celexa daily   Diabetes mellitus without complication ([place])    type II; takes Actos daily   GERD (gastroesophageal reflux disease)    Gout    Headache    Heart murmur    Heart palpitations    Hemorrhoids    History of blood transfusion    no abnormal reaction noted   History of bronchitis    Hyperlipidemia    Hypertension    takes Catapres and Quinapril daily   Hypothyroidism    takes Synthroid daily   Moderate mitral insufficiency    a. 12/2014 Echo: EF 55-60%, mod MR, mildly to moderately dil LA, PASP 68mHg.   Myocardial infarction ([facility]    " Minor"   Osteoarthritis    Paroxysmal supraventricular tachycardia (HCC)    Recurrent ventral  incisional hernia    Ringing in ears    Shortness of breath dyspnea    ALbuterol daily as needed   [place] disease ([place]    Urinary incontinence    Wears glasses    Past Surgical History:  Procedure Laterality Date   ABDOMINAL HYSTERECTOMY     BOWEL RESECTION     COLON SURGERY     COLONOSCOPY     COLONOSCOPY WITH PROPOFOL N/A 04/29/2019   Procedure: COLONOSCOPY WITH PROPOFOL;  Surgeon: FDanie Binder MD;  [facility];  Service: Endoscopy;  Laterality: N/A;  7:30am   ESOPHAGOGASTRODUODENOSCOPY     HEMORRHOID SURGERY  1998   HERNIA REPAIR     hiatal hernia repair   HIATAL HERNIA REPAIR  1980   pt is unsure of exact date but thinks it was early 24's   [place] N/A 04/02/2018   Procedure: OPEN VENTRAL HERNIA REPAIR WITH MESH, Explantation of hernia mesh;  Surgeon: Donnie Mesa, MD;  Location: [place];  Service: General;  Laterality: N/A;   INSERTION OF MESH N/A 04/02/2018   Procedure: INSERTION OF MESH;  Surgeon: Donnie Mesa, MD;  Location: [place];  Service: General;  Laterality: N/A;   POLYPECTOMY  04/29/2019   Procedure: POLYPECTOMY;  Surgeon: Danie Binder, MD;  [facility];  Service: Endoscopy;;   TOTAL HIP ARTHROPLASTY Left 02/02/2015   Procedure: LEFT TOTAL HIP ARTHROPLASTY;  Surgeon: Garald Balding, MD;  Location: [place];  Service: Orthopedics;  Laterality: Left;   TOTAL HIP ARTHROPLASTY Right 02/01/2016   Procedure: TOTAL HIP ARTHROPLASTY;  Surgeon: Garald Balding, MD;  Location: [place];  Service: Orthopedics;  Laterality: Right;   Family History  Problem Relation Age of Onset   Heart attack Mother    Kidney disease Mother    Diabetes Mother    Hypertension Mother    Colon polyps Mother    Cancer Father        Widespread; unknown primary   COPD Father    Hypertension Sister    Hyperlipidemia Sister    Colon cancer Maternal Aunt    Colon cancer Paternal Aunt    Social History   Socioeconomic History    Marital status: Single    Spouse name: Not on file   Number of children: 0   Years of education: 12   Highest education level: High school graduate  Occupational History   Occupation: retired  Tobacco Use   Smoking status: Never   Smokeless tobacco: Never  Scientific laboratory technician Use: Never used  Substance and Sexual Activity   Alcohol use: No    Alcohol/week: 0.0 standard drinks of alcohol   Drug use: No   Sexual activity: Not Currently    Birth control/protection: Surgical  Other Topics Concern   Not on file  Social History Narrative   Lives in single level home with her sister.    Social Determinants of Health   Financial Resource Strain: Low Risk  (11/24/2021)   Overall Financial Resource Strain (CARDIA)    Difficulty of Paying Living Expenses: Not hard at all  Food Insecurity: No Food Insecurity (11/24/2021)   Hunger Vital Sign    Worried About Running Out of Food in the Last Year: Never true    Ran Out of Food in the Last Year: Never true  Transportation Needs: No Transportation Needs (11/24/2021)   PRAPARE - Hydrologist (Medical): No    Lack of Transportation (Non-Medical): No  Physical Activity: Insufficiently Active (11/24/2021)   Exercise Vital Sign    Days of Exercise per Week: 7 days    Minutes of Exercise per Session: 20 min  Stress: No Stress Concern Present (11/24/2021)   [place]    Feeling of Stress : Not at all  Social Connections: Moderately Integrated (11/24/2021)   Social Connection and Isolation Panel [NHANES]    Frequency of Communication with Friends and Family: More than three times a week    Frequency of Social Gatherings with Friends and Family: More than three times a week    Attends Religious Services: 1 to 4 times per year    Active Member of Clubs or  Organizations: Yes    Attends Music therapist: 1 to 4 times per year    Marital Status:  Never married    Tobacco Counseling Counseling given: Not Answered   Clinical Intake:  Pre-visit preparation completed: Yes  Pain : 0-10 Pain Score: 8  Pain Type: Chronic pain Pain Location: Hip Pain Orientation: Right, Left Pain Descriptors / Indicators: Aching, Sore, Shooting Pain Onset: More than a month ago Pain Frequency: Intermittent     BMI - recorded: 32.44 Nutritional Status: BMI > 30  Obese Nutritional Risks: None Diabetes: Yes CBG done?: No Did pt. bring in CBG monitor from home?: No  How often do you need to have someone help you when you read instructions, pamphlets, or other written materials from your doctor or pharmacy?: 1 - Never  Diabetic? Nutrition Risk Assessment:  Has the patient had any N/V/D within the last 2 months?  Yes  Does the patient have any non-healing wounds?  No  Has the patient had any unintentional weight loss or weight gain?  No   Diabetes:  Is the patient diabetic?  Yes  If diabetic, was a CBG obtained today?  No  Did the patient bring in their glucometer from home?  No  How often do you monitor your CBG's? Once daily fasting - 127 fasting this am per patient.   Financial Strains and Diabetes Management:  Are you having any financial strains with the device, your supplies or your medication? No .  Does the patient want to be seen by Chronic Care Management for management of their diabetes?  No  Would the patient like to be referred to a Nutritionist or for Diabetic Management?  No   Diabetic Exams:  Diabetic Eye Exam: Completed 10/19/2021.  Diabetic Foot Exam: Completed 05/17/2021. Pt has been advised about the importance in completing this exam.   Interpreter Needed?: No  Information entered by :: Simaya Lumadue, LPN   Activities of Daily Living    11/24/2021    2:05 PM  In your present state of health, do you have any difficulty performing the following activities:  Hearing? 0  Vision? 0  Difficulty concentrating or  making decisions? 1  Comment only if she gets upset  Walking or climbing stairs? 1  Comment walks sideways one step at a time, holding on and using cane  Dressing or bathing? 0  Doing errands, shopping? 0  Preparing Food and eating ? N  Using the Toilet? N  In the past six months, have you accidently leaked urine? Y  Comment if Sugar or BP is elevated - mild intermittent  Do you have problems with loss of bowel control? N  Managing your Medications? N  Managing your Finances? N  Housekeeping or managing your Housekeeping? N    Patient Care Team: Sharion Balloon, FNP as PCP - General (Family Medicine) Garald Balding, MD as Consulting Physician (Orthopedic Surgery) Danie Binder, MD (Inactive) as Consulting Physician (Gastroenterology) [facility] (Ophthalmology) Lavera Guise, [facility] as Pharmacist (Family Medicine)  Indicate any recent Medical Services you may have received from other than [facility] providers in the past year (date may be approximate).     Assessment:   This is a routine wellness examination for [name].  Hearing/Vision screen Hearing Screening - Comments:: Denies hearing difficulties   Vision Screening - Comments:: Wears rx glasses - up to date with [facility]  Dietary issues and exercise activities discussed: Current  Exercise Habits: Home exercise routine, Type of exercise: walking;stretching, Time (Minutes): 15, Frequency (Times/Week): 7, Weekly Exercise (Minutes/Week): 105, Intensity: Mild, Exercise limited by: respiratory conditions(s);orthopedic condition(s);cardiac condition(s)   Goals Addressed               This Visit's Progress     Patient Stated (pt-stated)   Not on track     "getting back with God and worshiping him after this virus"      Patient Stated   Not on track     11/24/2021 AWV Goal: Exercise for General Health  Patient will verbalize understanding of the benefits of increased  physical activity: Exercising regularly is important. It will improve your overall fitness, flexibility, and endurance. Regular exercise also will improve your overall health. It can help you control your weight, reduce stress, and improve your bone density. Over the next year, patient will increase physical activity as tolerated with a goal of at least 150 minutes of moderate physical activity per week.  You can tell that you are exercising at a moderate intensity if your heart starts beating faster and you start breathing faster but can still hold a conversation. Moderate-intensity exercise ideas include: Walking 1 mile (1.6 km) in about 15 minutes Biking Hiking Golfing Dancing Water aerobics Patient will verbalize understanding of everyday activities that increase physical activity by providing examples like the following: Yard work, such as: Sales promotion account executive Gardening Washing windows or floors Patient will be able to explain general safety guidelines for exercising:  Before you start a new exercise program, talk with your health care provider. Do not exercise so much that you hurt yourself, feel dizzy, or get very short of breath. Wear comfortable clothes and wear shoes with good support. Drink plenty of water while you exercise to prevent dehydration or heat stroke. Work out until your breathing and your heartbeat get faster.        Depression Screen    11/24/2021    2:03 PM 11/17/2021    9:09 AM 09/15/2021    9:25 AM 03/18/2021    8:07 AM 02/17/2021   10:37 AM 11/22/2020    1:42 PM 11/16/2020   11:17 AM  PHQ 2/9 Scores  PHQ - 2 Score 0 0 0 6 0 0 0  PHQ- 9 Score '3 2  15       '$ Fall Risk    11/24/2021    2:01 PM 02/17/2021   10:37 AM 11/22/2020    1:43 PM 11/16/2020    9:37 AM 09/15/2020    9:43 AM  Fall Risk   Falls in the past year?  0 0 0 0  Number falls in past yr:   0    Injury with Fall?   0     Risk for fall due to : Orthopedic patient;Impaired balance/gait  Impaired vision    Follow up Falls prevention discussed;Education provided  Falls prevention discussed      FALL RISK PREVENTION PERTAINING TO THE HOME:  Any stairs in or around the home? Yes  If so, are there any without handrails? Yes  - is planning on adding one on front soon Home free of loose throw rugs in walkways, pet beds, electrical cords, etc? Yes  Adequate lighting in your home to reduce risk of falls? Yes   ASSISTIVE DEVICES UTILIZED TO PREVENT FALLS:  Life alert? No  Use of a cane, walker or w/c? Yes  Grab bars in the bathroom? No  Shower chair or bench in shower? Yes  Elevated toilet seat or a handicapped toilet? No   TIMED UP AND GO:  Was the test performed? No . Telephonic visit  Cognitive Function:        11/24/2021    2:11 PM 11/22/2020    1:41 PM 11/20/2019   10:17 AM 11/19/2018    2:06 PM  6CIT Screen  What Year? 0 points 0 points 0 points 0 points  What month? 0 points 0 points 0 points 0 points  What time? 0 points 0 points 0 points 0 points  Count back from 20 0 points 0 points 0 points 0 points  Months in reverse 0 points 0 points 0 points 0 points  Repeat phrase 6 points 0 points 2 points 0 points  Total Score 6 points 0 points 2 points 0 points    Immunizations Immunization History  Administered Date(s) Administered   Fluad Quad(high Dose 65+) 02/17/2020   Influenza, High Dose Seasonal PF 04/05/2018, 02/21/2019, 02/01/2021   Influenza,inj,Quad PF,6+ Mos 04/13/2016   Janssen (J&J) SARS-COV-2 Vaccination 09/18/2019, 04/08/2020   Moderna SARS-COV2 Booster Vaccination 03/10/2021, 11/12/2021   Pneumococcal Conjugate-13 05/01/2018   Pneumococcal Polysaccharide-23 02/04/2016   Tdap 10/27/2019   Zoster Recombinat (Shingrix) 01/10/2019, 03/19/2019    TDAP status: Up to date  Flu Vaccine status: Due, Education has been provided regarding the importance of this vaccine. Advised may  receive this vaccine at local pharmacy or Health Dept. Aware to provide a copy of the vaccination record if obtained from local pharmacy or Health Dept. Verbalized acceptance and understanding.  Pneumococcal vaccine status: Up to date  Covid-19 vaccine status: Completed vaccines  Qualifies for Shingles Vaccine? Yes   Zostavax completed Yes   Shingrix Completed?: Yes  Screening Tests Health Maintenance  Topic Date Due   COVID-19 Vaccine (3 - Booster for Janssen series) 01/07/2022   INFLUENZA VACCINE  01/10/2022   HEMOGLOBIN A1C  03/17/2022   FOOT EXAM  05/17/2022   OPHTHALMOLOGY EXAM  10/20/2022   DEXA SCAN  09/16/2023   COLONOSCOPY (Pts 45-63yr Insurance coverage will need to be confirmed)  04/28/2024   TETANUS/TDAP  10/26/2029   Pneumonia Vaccine 70 Years old  Completed   Hepatitis C Screening  Completed   Zoster Vaccines- Shingrix  Completed   HPV VACCINES  Aged Out   MAMMOGRAM  Discontinued    Health Maintenance  There are no preventive care reminders to display for this patient.   Colorectal cancer screening: Type of screening: Colonoscopy. Completed 04/29/2019. Repeat every 5-7 years  Mammogram status: No longer required due to patient declines.  Bone Density status: Completed 09/15/2021. Results reflect: Bone density results: OSTEOPOROSIS. Repeat every 2 years.  Lung Cancer Screening: (Low Dose CT Chest recommended if Age 70-80years, 30 pack-year currently smoking OR have quit w/in 15years.) does not qualify.   Additional Screening:  Hepatitis C Screening: does qualify; Completed 10/27/2019  Vision Screening: Recommended annual ophthalmology exams for early detection of glaucoma and other disorders of the eye. Is the patient up to date with their annual eye exam?  Yes  Who is the provider or what is the name of the office in which the patient attends annual eye exams[place] [facility] [place] not established with a provider, would they like to be  referred to a provider to establish care? No .   Dental Screening: Recommended annual dental exams for proper oral hygiene  Community Resource Referral / Chronic Care Management: CRR required this visit?  No   CCM required this visit?  No      Plan:     I have personally reviewed and noted the following in the patient's chart:   Medical and social history Use of alcohol, tobacco or illicit drugs  Current medications and supplements including opioid prescriptions.  Functional ability and status Nutritional status Physical activity Advanced directives List of other physicians Hospitalizations, surgeries, and ER visits in previous 12 months Vitals Screenings to include cognitive, depression, and falls Referrals and appointments  In addition, I have reviewed and discussed with patient certain preventive protocols, quality metrics, and best practice recommendations. A written personalized care plan for preventive services as well as general preventive health recommendations were provided to patient.     Sandrea Hammond, LPN   09/28/6220   Nurse Notes: She is concerned about her BP continuing to run 150s/90s - sent phone message to PCP to address and call back.

## 2021-11-24 NOTE — Telephone Encounter (Signed)
Lisinopril 40 mg Prescription sent to pharmacy

## 2021-11-24 NOTE — Telephone Encounter (Signed)
She has been taking Lisinopril since May 19 - started with '10mg'$ , it was increased to '20mg'$  June 9 - Her BP is still running high around 150/90 most of the time. Should she give more time or do you want to make changes?

## 2021-11-24 NOTE — Addendum Note (Signed)
Addended by: Evelina Dun A on: 11/24/2021 04:43 PM   Modules accepted: Orders

## 2021-11-24 NOTE — Patient Instructions (Signed)
[name] , Thank you for taking time to come for your Medicare Wellness Visit. I appreciate your ongoing commitment to your health goals. Please review the following plan we discussed and let me know if I can assist you in the future.   Screening recommendations/referrals: Colonoscopy: Done 04/29/2019 - repeat in 5-7 years Mammogram: Declined - recommended annually Bone Density: Done 09/15/2021 - Repeat every 2 years  Recommended yearly ophthalmology/optometry visit for glaucoma screening and checkup Recommended yearly dental visit for hygiene and checkup  Vaccinations: Influenza vaccine: Recommended every fall Pneumococcal vaccine: Done  02/04/2016 & 05/01/2018   Tdap vaccine: Done 10/27/2019 - Repeat in 10 years Shingles vaccine: Done 01/10/2019 & 03/19/2019   Covid-19: [name] done 09/18/2019 & 04/08/2020; Moderna boosters done 03/10/2021 & 11/12/2021  Advanced directives: Advance directive discussed with you today. Even though you declined this today, please call our office should you change your mind, and we can give you the proper paperwork for you to fill out.   Conditions/risks identified: Aim for 30 minutes of exercise or brisk walking, 6-8 glasses of water, and 5 servings of fruits and vegetables each day.   Next appointment: Follow up in one year for your annual wellness visit    Preventive Care 65 Years and Older, Female Preventive care refers to lifestyle choices and visits with your health care provider that can promote health and wellness. What does preventive care include? A yearly physical exam. This is also called an annual well check. Dental exams once or twice a year. Routine eye exams. Ask your health care provider how often you should have your eyes checked. Personal lifestyle choices, including: Daily care of your teeth and gums. Regular physical activity. Eating a healthy diet. Avoiding tobacco and drug use. Limiting alcohol use. Practicing safe sex. Taking low-dose  aspirin every day. Taking vitamin and mineral supplements as recommended by your health care provider. What happens during an annual well check? The services and screenings done by your health care provider during your annual well check will depend on your age, overall health, lifestyle risk factors, and family history of disease. Counseling  Your health care provider may ask you questions about your: Alcohol use. Tobacco use. Drug use. Emotional well-being. Home and relationship well-being. Sexual activity. Eating habits. History of falls. Memory and ability to understand (cognition). Work and work Statistician. Reproductive health. Screening  You may have the following tests or measurements: Height, weight, and BMI. Blood pressure. Lipid and cholesterol levels. These may be checked every 5 years, or more frequently if you are over 67 years old. Skin check. Lung cancer screening. You may have this screening every year starting at age 30 if you have a 30-pack-year history of smoking and currently smoke or have quit within the past 15 years. Fecal occult blood test (FOBT) of the stool. You may have this test every year starting at age 80. Flexible sigmoidoscopy or colonoscopy. You may have a sigmoidoscopy every 5 years or a colonoscopy every 10 years starting at age 73. Hepatitis C blood test. Hepatitis B blood test. Sexually transmitted disease (STD) testing. Diabetes screening. This is done by checking your blood sugar (glucose) after you have not eaten for a while (fasting). You may have this done every 1-3 years. Bone density scan. This is done to screen for osteoporosis. You may have this done starting at age 85. Mammogram. This may be done every 1-2 years. Talk to your health care provider about how often you should have regular mammograms.  Talk with your health care provider about your test results, treatment options, and if necessary, the need for more tests. Vaccines  Your  health care provider may recommend certain vaccines, such as: Influenza vaccine. This is recommended every year. Tetanus, diphtheria, and acellular pertussis (Tdap, Td) vaccine. You may need a Td booster every 10 years. Zoster vaccine. You may need this after age 40. Pneumococcal 13-valent conjugate (PCV13) vaccine. One dose is recommended after age 38. Pneumococcal polysaccharide (PPSV23) vaccine. One dose is recommended after age 24. Talk to your health care provider about which screenings and vaccines you need and how often you need them. This information is not intended to replace advice given to you by your health care provider. Make sure you discuss any questions you have with your health care provider. Document Released: 06/25/2015 Document Revised: 02/16/2016 Document Reviewed: 03/30/2015 Elsevier Interactive Patient Education  2017 [place] Prevention in the Home Falls can cause injuries. They can happen to people of all ages. There are many things you can do to make your home safe and to help prevent falls. What can I do on the outside of my home? Regularly fix the edges of walkways and driveways and fix any cracks. Remove anything that might make you trip as you walk through a door, such as a raised step or threshold. Trim any bushes or trees on the path to your home. Use bright outdoor lighting. Clear any walking paths of anything that might make someone trip, such as rocks or tools. Regularly check to see if handrails are loose or broken. Make sure that both sides of any steps have handrails. Any raised decks and porches should have guardrails on the edges. Have any leaves, snow, or ice cleared regularly. Use sand or salt on walking paths during winter. Clean up any spills in your garage right away. This includes oil or grease spills. What can I do in the bathroom? Use night lights. Install grab bars by the toilet and in the tub and shower. Do not use towel bars as  grab bars. Use non-skid mats or decals in the tub or shower. If you need to sit down in the shower, use a plastic, non-slip stool. Keep the floor dry. Clean up any water that spills on the floor as soon as it happens. Remove soap buildup in the tub or shower regularly. Attach bath mats securely with double-sided non-slip rug tape. Do not have throw rugs and other things on the floor that can make you trip. What can I do in the bedroom? Use night lights. Make sure that you have a light by your bed that is easy to reach. Do not use any sheets or blankets that are too big for your bed. They should not hang down onto the floor. Have a firm chair that has side arms. You can use this for support while you get dressed. Do not have throw rugs and other things on the floor that can make you trip. What can I do in the kitchen? Clean up any spills right away. Avoid walking on wet floors. Keep items that you use a lot in easy-to-reach places. If you need to reach something above you, use a strong step stool that has a grab bar. Keep electrical cords out of the way. Do not use floor polish or wax that makes floors slippery. If you must use wax, use non-skid floor wax. Do not have throw rugs and other things on the floor that can make  you trip. What can I do with my stairs? Do not leave any items on the stairs. Make sure that there are handrails on both sides of the stairs and use them. Fix handrails that are broken or loose. Make sure that handrails are as long as the stairways. Check any carpeting to make sure that it is firmly attached to the stairs. Fix any carpet that is loose or worn. Avoid having throw rugs at the top or bottom of the stairs. If you do have throw rugs, attach them to the floor with carpet tape. Make sure that you have a light switch at the top of the stairs and the bottom of the stairs. If you do not have them, ask someone to add them for you. What else can I do to help prevent  falls? Wear shoes that: Do not have high heels. Have rubber bottoms. Are comfortable and fit you well. Are closed at the toe. Do not wear sandals. If you use a stepladder: Make sure that it is fully opened. Do not climb a closed stepladder. Make sure that both sides of the stepladder are locked into place. Ask someone to hold it for you, if possible. Clearly mark and make sure that you can see: Any grab bars or handrails. First and last steps. Where the edge of each step is. Use tools that help you move around (mobility aids) if they are needed. These include: Canes. Walkers. Scooters. Crutches. Turn on the lights when you go into a dark area. Replace any light bulbs as soon as they burn out. Set up your furniture so you have a clear path. Avoid moving your furniture around. If any of your floors are uneven, fix them. If there are any pets around you, be aware of where they are. Review your medicines with your doctor. Some medicines can make you feel dizzy. This can increase your chance of falling. Ask your doctor what other things that you can do to help prevent falls. This information is not intended to replace advice given to you by your health care provider. Make sure you discuss any questions you have with your health care provider. Document Released: 03/25/2009 Document Revised: 11/04/2015 Document Reviewed: 07/03/2014 Elsevier Interactive Patient Education  2017 [employer].

## 2021-11-25 ENCOUNTER — Telehealth: Payer: Self-pay | Admitting: Family

## 2021-11-25 NOTE — Telephone Encounter (Signed)
Patient returning call. Please call back

## 2021-12-06 ENCOUNTER — Encounter: Payer: Self-pay | Admitting: Family

## 2021-12-06 ENCOUNTER — Ambulatory Visit (INDEPENDENT_AMBULATORY_CARE_PROVIDER_SITE_OTHER): Payer: Medicare Other | Admitting: Family

## 2021-12-06 VITALS — BP 139/87 | Temp 97.9°F | Ht 64.0 in | Wt 190.4 lb

## 2021-12-06 DIAGNOSIS — E1149 Type 2 diabetes mellitus with other diabetic neurological complication: Secondary | ICD-10-CM

## 2021-12-06 DIAGNOSIS — Z794 Long term (current) use of insulin: Secondary | ICD-10-CM | POA: Diagnosis not present

## 2021-12-06 DIAGNOSIS — E1165 Type 2 diabetes mellitus with hyperglycemia: Secondary | ICD-10-CM | POA: Diagnosis not present

## 2021-12-06 DIAGNOSIS — E669 Obesity, unspecified: Secondary | ICD-10-CM

## 2021-12-06 DIAGNOSIS — I10 Essential (primary) hypertension: Secondary | ICD-10-CM

## 2021-12-06 DIAGNOSIS — M5412 Radiculopathy, cervical region: Secondary | ICD-10-CM

## 2021-12-06 MED ORDER — INSULIN LISPRO 100 UNIT/ML IJ SOLN: 4.0000 [IU] | Freq: Three times a day (TID) | INTRAMUSCULAR | 2 refills | Status: DC

## 2021-12-06 MED ORDER — ONETOUCH ULTRASOFT LANCETS MISC: 12 refills | Status: DC

## 2021-12-06 MED ORDER — GABAPENTIN 300 MG PO CAPS: 300.0000 mg | ORAL_CAPSULE | Freq: Three times a day (TID) | ORAL | 3 refills | Status: DC

## 2021-12-07 LAB — BMP8+EGFR
BUN/Creatinine Ratio: 19 (ref 12–28)
BUN: 22 mg/dL (ref 8–27)
CO2: 24 mmol/L (ref 20–29)
Calcium: 10.6 mg/dL — ABNORMAL HIGH (ref 8.7–10.3)
Chloride: 103 mmol/L (ref 96–106)
Creatinine, Ser: 1.16 mg/dL — ABNORMAL HIGH (ref 0.57–1.00)
Glucose: 54 mg/dL — ABNORMAL LOW (ref 70–99)
Potassium: 4.2 mmol/L (ref 3.5–5.2)
Sodium: 143 mmol/L (ref 134–144)
eGFR: 51 mL/min/{1.73_m2} — ABNORMAL LOW (ref >59)

## 2021-12-08 NOTE — Telephone Encounter (Signed)
Left message to call back  

## 2021-12-15 ENCOUNTER — Ambulatory Visit: Payer: Medicare Other | Admitting: Family

## 2021-12-16 NOTE — Telephone Encounter (Signed)
Patient aware.

## 2022-01-04 ENCOUNTER — Telehealth: Payer: Medicare Other

## 2022-01-05 ENCOUNTER — Telehealth: Payer: Self-pay | Admitting: Family

## 2022-01-05 NOTE — Telephone Encounter (Signed)
Pt is asking for dosage increase for gabapentin (NEURONTIN) 300 MG capsule. She says that the '300mg'$  is not strong enough. She says that she is still in aching pain. Use [facility].

## 2022-01-05 NOTE — Telephone Encounter (Signed)
Patient was seen on 12/06/21 and Gabapentin was increased from 100 mg TID to 300 mg TID.

## 2022-01-06 MED ORDER — GABAPENTIN 600 MG PO TABS: 600.0000 mg | ORAL_TABLET | Freq: Three times a day (TID) | ORAL | 2 refills | Status: DC

## 2022-01-06 NOTE — Telephone Encounter (Signed)
Left message for patient to call back  

## 2022-01-06 NOTE — Telephone Encounter (Signed)
Gabapentin increased to 600 mg from 300 mg three times a day.

## 2022-01-26 ENCOUNTER — Telehealth: Payer: Self-pay | Admitting: Cardiology

## 2022-01-26 NOTE — Telephone Encounter (Signed)
Pt c/o Shortness Of Breath: STAT if SOB developed within the last 24 hours or pt is noticeably SOB on the phone  1. Are you currently SOB (can you hear that pt is SOB on the phone)? no  2. How long have you been experiencing SOB? Since last appt   3. Are you SOB when sitting or when up moving around? Both   4. Are you currently experiencing any other symptoms? Pt states she has been having trouble breathing since she started the    carvedilol (COREG) 3.125 MG tablet     Budeson-Glycopyrrol-Formoterol (BREZTRI AEROSPHERE) 160-9-4.8 MCG/ACT AERO

## 2022-01-27 NOTE — Telephone Encounter (Signed)
[name] states that her pcp switched her inhaler to breztri and she says it has not helped her. [facility] and [facility] told her her Coreg could be making her SOB. She was switched from metoprolol to coreg in December 2022. She then states that she believes environmental factors like the [place] air may be affecting her. Her SOB is random,denies any lower extremity swelling.She does not weigh self.  I told her to f/u with her pcp regarding her inhaler and I would message Dr.Pemberton.

## 2022-01-27 NOTE — Telephone Encounter (Signed)
I spoke with Ms.Rinn and she is going to speak with Evelina Dun, NP regarding the new inhaler she was given. She will call us back if symptoms worsen.

## 2022-01-31 ENCOUNTER — Other Ambulatory Visit: Payer: Self-pay | Admitting: Family

## 2022-02-01 ENCOUNTER — Telehealth: Payer: Self-pay | Admitting: Cardiology

## 2022-02-01 NOTE — Telephone Encounter (Signed)
Per Sig: Take 1 tablet (3.125 mg total) by mouth 2 (two) times daily.

## 2022-02-01 NOTE — Telephone Encounter (Signed)
Spoke to pt and verbalized coreg dosage. Pt verbalized understanding and had no questions or concerns at this time.

## 2022-02-01 NOTE — Telephone Encounter (Signed)
Pt c/o medication issue:  1. Name of Medication: Carvedilol  2. How are you currently taking this medication (dosage and times per day)?   3. Are you having a reaction (difficulty breathing--STAT)?   4. What is your medication issue?   She needs to know the correct milligram she is supposed to be taking- if possible she says she need to know before (:30 this morning please

## 2022-02-06 ENCOUNTER — Telehealth: Payer: Self-pay | Admitting: *Deleted

## 2022-02-06 NOTE — Patient Outreach (Signed)
  Care Coordination   Initial Visit Note   02/06/2022 Name: [name] MRN: [id number] DOB: 1950-08-21  [name] is a 71 y.o. year old female who sees Gary, Theador Hawthorne, FNP for primary care. I spoke with  [name] by phone today.  What matters to the patients health and wellness today?  No concerns expressed.    Goals Addressed   None     SDOH assessments and interventions completed:  No     Care Coordination Interventions Activated:  No  Care Coordination Interventions:  No, not indicated   Follow up plan: No further intervention required.   Encounter Outcome:  Pt. Visit Completed   Angela Loron RN, BSN [place] [phone] [name]

## 2022-02-10 ENCOUNTER — Other Ambulatory Visit: Payer: Self-pay | Admitting: Family

## 2022-02-10 DIAGNOSIS — E1065 Type 1 diabetes mellitus with hyperglycemia: Secondary | ICD-10-CM

## 2022-02-11 ENCOUNTER — Other Ambulatory Visit: Payer: Self-pay | Admitting: Family

## 2022-02-21 ENCOUNTER — Ambulatory Visit: Payer: Medicare Other | Admitting: Family

## 2022-02-25 ENCOUNTER — Other Ambulatory Visit: Payer: Self-pay | Admitting: Family

## 2022-02-25 DIAGNOSIS — I10 Essential (primary) hypertension: Secondary | ICD-10-CM

## 2022-03-09 ENCOUNTER — Ambulatory Visit: Payer: Medicare Other | Admitting: Family

## 2022-03-17 ENCOUNTER — Encounter: Payer: Self-pay | Admitting: Family

## 2022-03-17 ENCOUNTER — Ambulatory Visit (INDEPENDENT_AMBULATORY_CARE_PROVIDER_SITE_OTHER): Payer: Medicare Other | Admitting: Family

## 2022-03-17 VITALS — BP 125/75 | HR 87 | Temp 97.3°F | Ht 64.0 in | Wt 189.0 lb

## 2022-03-17 DIAGNOSIS — J4520 Mild intermittent asthma, uncomplicated: Secondary | ICD-10-CM

## 2022-03-17 DIAGNOSIS — E1165 Type 2 diabetes mellitus with hyperglycemia: Secondary | ICD-10-CM

## 2022-03-17 DIAGNOSIS — Z794 Long term (current) use of insulin: Secondary | ICD-10-CM

## 2022-03-17 DIAGNOSIS — I10 Essential (primary) hypertension: Secondary | ICD-10-CM | POA: Diagnosis not present

## 2022-03-17 DIAGNOSIS — E669 Obesity, unspecified: Secondary | ICD-10-CM

## 2022-03-17 DIAGNOSIS — M159 Polyosteoarthritis, unspecified: Secondary | ICD-10-CM | POA: Diagnosis not present

## 2022-03-17 DIAGNOSIS — Z0000 Encounter for general adult medical examination without abnormal findings: Secondary | ICD-10-CM

## 2022-03-17 DIAGNOSIS — Z0001 Encounter for general adult medical examination with abnormal findings: Secondary | ICD-10-CM | POA: Diagnosis not present

## 2022-03-17 DIAGNOSIS — N1831 Chronic kidney disease, stage 3a: Secondary | ICD-10-CM | POA: Diagnosis not present

## 2022-03-17 DIAGNOSIS — I340 Nonrheumatic mitral (valve) insufficiency: Secondary | ICD-10-CM

## 2022-03-17 DIAGNOSIS — E039 Hypothyroidism, unspecified: Secondary | ICD-10-CM

## 2022-03-17 DIAGNOSIS — D649 Anemia, unspecified: Secondary | ICD-10-CM | POA: Diagnosis not present

## 2022-03-17 DIAGNOSIS — E559 Vitamin D deficiency, unspecified: Secondary | ICD-10-CM

## 2022-03-17 DIAGNOSIS — F411 Generalized anxiety disorder: Secondary | ICD-10-CM

## 2022-03-17 LAB — BAYER DCA HB A1C WAIVED: HB A1C (BAYER DCA - WAIVED): 7.1 % — ABNORMAL HIGH (ref 4.8–5.6)

## 2022-03-17 MED ORDER — ACETAMINOPHEN 500 MG PO TABS: 500.0000 mg | ORAL_TABLET | Freq: Four times a day (QID) | ORAL | 1 refills | Status: AC | PRN

## 2022-03-17 NOTE — Patient Instructions (Signed)
Osteoarthritis  Osteoarthritis is a type of arthritis. It refers to joint pain or joint disease. Osteoarthritis affects tissue that covers the ends of bones in joints (cartilage). Cartilage acts as a cushion between the bones and helps them move smoothly. Osteoarthritis occurs when cartilage in the joints gets worn down. Osteoarthritis is sometimes called "wear and tear" arthritis. Osteoarthritis is the most common form of arthritis. It often occurs in older people. It is a condition that gets worse over time. The joints most often affected by this condition are in the fingers, toes, hips, knees, and spine, including the neck and lower back. What are the causes? This condition is caused by the wearing down of cartilage that covers the ends of bones. What increases the risk? The following factors may make you more likely to develop this condition: Being age 50 or older. Obesity. Overuse of joints. Past injury of a joint. Past surgery on a joint. Family history of osteoarthritis. What are the signs or symptoms? The main symptoms of this condition are pain, swelling, and stiffness in the joint. Other symptoms may include: An enlarged joint. More pain and further damage caused by small pieces of bone or cartilage that break off and float inside of the joint. Small deposits of bone (osteophytes) that grow on the edges of the joint. A grating or scraping feeling inside the joint when you move it. Popping or creaking sounds when you move. Difficulty walking or exercising. An inability to grip items, twist your hand(s), or control the movements of your hands and fingers. How is this diagnosed? This condition may be diagnosed based on: Your medical history. A physical exam. Your symptoms. X-rays of the affected joint(s). Blood tests to rule out other types of arthritis. How is this treated? There is no cure for this condition, but treatment can help control pain and improve joint function.  Treatment may include a combination of therapies, such as: Pain relief techniques, such as: Applying heat and cold to the joint. Massage. A form of talk therapy called cognitive behavioral therapy (CBT). This therapy helps you set goals and follow up on the changes that you make. Medicines for pain and inflammation. The medicines can be taken by mouth or applied to the skin. They include: NSAIDs, such as ibuprofen. Prescription medicines. Strong anti-inflammatory medicines (corticosteroids). Certain nutritional supplements. A prescribed exercise program. You may work with a physical therapist. Assistive devices, such as a brace, wrap, splint, specialized glove, or cane. A weight control plan. Surgery, such as: An osteotomy. This is done to reposition the bones and relieve pain or to remove loose pieces of bone and cartilage. Joint replacement surgery. You may need this surgery if you have advanced osteoarthritis. Follow these instructions at home: Activity Rest your affected joints as told by your health care provider. Exercise as told by your health care provider. He or she may recommend specific types of exercise, such as: Strengthening exercises. These are done to strengthen the muscles that support joints affected by arthritis. Aerobic activities. These are exercises, such as brisk walking or water aerobics, that increase your heart rate. Range-of-motion activities. These help your joints move more easily. Balance and agility exercises. Managing pain, stiffness, and swelling     If directed, apply heat to the affected area as often as told by your health care provider. Use the heat source that your health care provider recommends, such as a moist heat pack or a heating pad. If you have a removable assistive device, remove it   as told by your health care provider. Place a towel between your skin and the heat source. If your health care provider tells you to keep the assistive device  on while you apply heat, place a towel between the assistive device and the heat source. Leave the heat on for 20-30 minutes. Remove the heat if your skin turns bright red. This is especially important if you are unable to feel pain, heat, or cold. You may have a greater risk of getting burned. If directed, put ice on the affected area. To do this: If you have a removable assistive device, remove it as told by your health care provider. Put ice in a plastic bag. Place a towel between your skin and the bag. If your health care provider tells you to keep the assistive device on during icing, place a towel between the assistive device and the bag. Leave the ice on for 20 minutes, 2-3 times a day. Move your fingers or toes often to reduce stiffness and swelling. Raise (elevate) the injured area above the level of your heart while you are sitting or lying down. General instructions Take over-the-counter and prescription medicines only as told by your health care provider. Maintain a healthy weight. Follow instructions from your health care provider for weight control. Do not use any products that contain nicotine or tobacco, such as cigarettes, e-cigarettes, and chewing tobacco. If you need help quitting, ask your health care provider. Use assistive devices as told by your health care provider. Keep all follow-up visits as told by your health care provider. This is important. Where to find more information National Institute of Arthritis and Musculoskeletal and Skin Diseases: www.niams.nih.gov National Institute on Aging: www.nia.nih.gov American College of Rheumatology: www.rheumatology.org Contact a health care provider if: You have redness, swelling, or a feeling of warmth in a joint that gets worse. You have a fever along with joint or muscle aches. You develop a rash. You have trouble doing your normal activities. Get help right away if: You have pain that gets worse and is not relieved by  pain medicine. Summary Osteoarthritis is a type of arthritis that affects tissue covering the ends of bones in joints (cartilage). This condition is caused by the wearing down of cartilage that covers the ends of bones. The main symptom of this condition is pain, swelling, and stiffness in the joint. There is no cure for this condition, but treatment can help control pain and improve joint function. This information is not intended to replace advice given to you by your health care provider. Make sure you discuss any questions you have with your health care provider. Document Revised: 05/26/2019 Document Reviewed: 05/26/2019 Elsevier Patient Education  2023 [employer]  

## 2022-03-17 NOTE — Progress Notes (Signed)
Subjective:    Patient ID: [name], female    DOB: [birth year], 71 y.o.   MRN: [id number]  Chief Complaint  Patient presents with   Medical Management of Chronic Issues   Pt calls the office today for CPE and chronic follow up. She is followed Cardiologists annually for mitral insufficiency.  She has CKD and avoids NSAID's.  Diabetes She presents for her follow-up diabetic visit. She has type 2 diabetes mellitus. Hypoglycemia symptoms include nervousness/anxiousness. Pertinent negatives for diabetes include no blurred vision, no fatigue and no foot paresthesias. Symptoms are stable. Risk factors for coronary artery disease include dyslipidemia, diabetes mellitus, hypertension, sedentary lifestyle and post-menopausal. She is following a generally unhealthy diet. Her overall blood glucose range is 90-110 mg/dl. Eye exam is current.  Hypertension This is a chronic problem. The current episode started more than 1 year ago. The problem has been resolved since onset. The problem is controlled. Associated symptoms include malaise/fatigue. Pertinent negatives include no blurred vision, peripheral edema or shortness of breath. Risk factors for coronary artery disease include dyslipidemia, diabetes mellitus, obesity and sedentary lifestyle. The current treatment provides moderate improvement. Identifiable causes of hypertension include a thyroid problem.  Thyroid Problem Presents for follow-up visit. Symptoms include anxiety and dry skin. Patient reports no constipation, depressed mood or fatigue. The symptoms have been stable.  Asthma She complains of wheezing. There is no cough or shortness of breath. This is a chronic problem. The current episode started more than 1 year ago. The problem occurs intermittently. Associated symptoms include malaise/fatigue. Her symptoms are alleviated by rest and steroid inhaler. She reports moderate improvement on treatment. Her past medical history is significant  for asthma.  Arthritis Presents for follow-up visit. She complains of pain and stiffness. Affected locations include the left knee, right knee, left MCP and right MCP. Her pain is at a severity of 10/10. Pertinent negatives include no fatigue.  Anxiety Presents for follow-up visit. Symptoms include excessive worry, irritability, nervous/anxious behavior and restlessness. Patient reports no depressed mood or shortness of breath. Symptoms occur most days. The severity of symptoms is moderate.   Her past medical history is significant for asthma.      Review of Systems  Constitutional:  Positive for irritability and malaise/fatigue. Negative for fatigue.  Eyes:  Negative for blurred vision.  Respiratory:  Positive for wheezing. Negative for cough and shortness of breath.   Gastrointestinal:  Negative for constipation.  Musculoskeletal:  Positive for arthritis and stiffness.  Psychiatric/Behavioral:  The patient is nervous/anxious.   All other systems reviewed and are negative.  Family History  Problem Relation Age of Onset   Heart attack Mother    Kidney disease Mother    Diabetes Mother    Hypertension Mother    Colon polyps Mother    Cancer Father        Widespread; unknown primary   COPD Father    Hypertension Sister    Hyperlipidemia Sister    Colon cancer Maternal Aunt    Colon cancer Paternal Aunt    Social History   Socioeconomic History   Marital status: Single    Spouse name: Not on file   Number of children: 0   Years of education: 12   Highest education level: High school graduate  Occupational History   Occupation: retired  Tobacco Use   Smoking status: Never   Smokeless tobacco: Never  Vaping Use   Vaping Use: Never used  Substance and Sexual Activity  Alcohol use: No    Alcohol/week: 0.0 standard drinks of alcohol   Drug use: No   Sexual activity: Not Currently    Birth control/protection: Surgical  Other Topics Concern   Not on file  Social  History Narrative   Lives in single level home with her sister.    Social Determinants of Health   Financial Resource Strain: Low Risk  (11/24/2021)   Overall Financial Resource Strain (CARDIA)    Difficulty of Paying Living Expenses: Not hard at all  Food Insecurity: No Food Insecurity (11/24/2021)   Hunger Vital Sign    Worried About Running Out of Food in the Last Year: Never true    Ran Out of Food in the Last Year: Never true  Transportation Needs: No Transportation Needs (11/24/2021)   PRAPARE - Hydrologist (Medical): No    Lack of Transportation (Non-Medical): No  Physical Activity: Insufficiently Active (11/24/2021)   Exercise Vital Sign    Days of Exercise per Week: 7 days    Minutes of Exercise per Session: 20 min  Stress: No Stress Concern Present (11/24/2021)   [place]    Feeling of Stress : Not at all  Social Connections: Moderately Integrated (11/24/2021)   Social Connection and Isolation Panel [NHANES]    Frequency of Communication with Friends and Family: More than three times a week    Frequency of Social Gatherings with Friends and Family: More than three times a week    Attends Religious Services: 1 to 4 times per year    Active Member of [employer] or Organizations: Yes    Attends Archivist Meetings: 1 to 4 times per year    Marital Status: Never married       Objective:   Physical Exam Vitals reviewed.  Constitutional:      General: She is not in acute distress.    Appearance: She is well-developed. She is obese.  HENT:     Head: Normocephalic and atraumatic.     Right Ear: Tympanic membrane normal.     Left Ear: Tympanic membrane normal.  Eyes:     Pupils: Pupils are equal, round, and reactive to light.  Neck:     Thyroid: No thyromegaly.  Cardiovascular:     Rate and Rhythm: Normal rate and regular rhythm.     Heart sounds: Normal heart sounds. No  murmur heard. Pulmonary:     Effort: Pulmonary effort is normal. No respiratory distress.     Breath sounds: Normal breath sounds. No wheezing.  Abdominal:     General: Bowel sounds are normal. There is no distension.     Palpations: Abdomen is soft.     Tenderness: There is no abdominal tenderness.  Musculoskeletal:        General: No tenderness. Normal range of motion.     Cervical back: Normal range of motion and neck supple.  Skin:    General: Skin is warm and dry.  Neurological:     Mental Status: She is alert and oriented to person, place, and time.     Cranial Nerves: No cranial nerve deficit.     Gait: Gait abnormal (using cane).     Deep Tendon Reflexes: Reflexes are normal and symmetric.  Psychiatric:        Behavior: Behavior normal.        Thought Content: Thought content normal.        Judgment: Judgment  normal.       BP 101/62   Pulse 87   Temp (!) 97.3 F (36.3 C) (Temporal)   Ht _0  (1.626 m)   Wt 189 lb (85.7 kg)   SpO2 97%   BMI 32.44 kg/m      Assessment & Plan:  [name] comes in today with chief complaint of Medical Management of Chronic Issues   Diagnosis and orders addressed:  1. Primary hypertension - CBC with Differential/Platelet - CMP14+EGFR  2. Moderate mitral insufficiency - CBC with Differential/Platelet - CMP14+EGFR  3. Mild intermittent asthma, unspecified whether complicated - CBC with Differential/Platelet - CMP14+EGFR  4. Type 2 diabetes mellitus with hyperglycemia, with long-term current use of insulin (HCC)  - CBC with Differential/Platelet - CMP14+EGFR - Bayer DCA Hb A1c Waived - Microalbumin / creatinine urine ratio  5. Hypothyroidism, unspecified type - CBC with Differential/Platelet - CMP14+EGFR - TSH  6. Generalized OA Start Tylenol  - CBC with Differential/Platelet - CMP14+EGFR - acetaminophen (TYLENOL) 500 MG tablet; Take 1 tablet (500 mg total) by mouth every 6 (six) hours as needed.  Dispense:  60 tablet; Refill: 1  7. Stage 3a chronic kidney disease (HCC) - CBC with Differential/Platelet - CMP14+EGFR  8. Vitamin D deficiency - CBC with Differential/Platelet - CMP14+EGFR - VITAMIN D 25 Hydroxy (Vit-D Deficiency, Fractures)  9. Anemia, unspecified type - CBC with Differential/Platelet - CMP14+EGFR  10. Generalized anxiety disorder - CBC with Differential/Platelet - CMP14+EGFR  11. Obesity (BMI 30-39.9) - CBC with Differential/Platelet - CMP14+EGFR  12. Annual physical exam - CBC with Differential/Platelet - CMP14+EGFR - Bayer DCA Hb A1c Waived - Microalbumin / creatinine urine ratio - Lipid panel - TSH - VITAMIN D 25 Hydroxy (Vit-D Deficiency, Fractures)   Labs pending Health Maintenance reviewed Diet and exercise encouraged  Follow up plan: 3 months    Evelina Dun, FNP

## 2022-03-18 LAB — LIPID PANEL
Chol/HDL Ratio: 4.9 ratio — ABNORMAL HIGH (ref 0.0–4.4)
Cholesterol, Total: 177 mg/dL (ref 100–199)
HDL: 36 mg/dL — ABNORMAL LOW (ref >39)
LDL Chol Calc (NIH): 113 mg/dL — ABNORMAL HIGH (ref 0–99)
Triglycerides: 156 mg/dL — ABNORMAL HIGH (ref 0–149)
VLDL Cholesterol Cal: 28 mg/dL (ref 5–40)

## 2022-03-18 LAB — CMP14+EGFR
ALT: 12 IU/L (ref 0–32)
AST: 12 IU/L (ref 0–40)
Albumin/Globulin Ratio: 1.3 (ref 1.2–2.2)
Albumin: 4.4 g/dL (ref 3.9–4.9)
Alkaline Phosphatase: 157 IU/L — ABNORMAL HIGH (ref 44–121)
BUN/Creatinine Ratio: 13 (ref 12–28)
BUN: 22 mg/dL (ref 8–27)
Bilirubin Total: 0.3 mg/dL (ref 0.0–1.2)
CO2: 23 mmol/L (ref 20–29)
Calcium: 10.6 mg/dL — ABNORMAL HIGH (ref 8.7–10.3)
Chloride: 101 mmol/L (ref 96–106)
Creatinine, Ser: 1.69 mg/dL — ABNORMAL HIGH (ref 0.57–1.00)
Globulin, Total: 3.3 g/dL (ref 1.5–4.5)
Glucose: 99 mg/dL (ref 70–99)
Potassium: 5.2 mmol/L (ref 3.5–5.2)
Sodium: 141 mmol/L (ref 134–144)
Total Protein: 7.7 g/dL (ref 6.0–8.5)
eGFR: 32 mL/min/{1.73_m2} — ABNORMAL LOW (ref >59)

## 2022-03-18 LAB — CBC WITH DIFFERENTIAL/PLATELET
Basophils Absolute: 0 10*3/uL (ref 0.0–0.2)
Basos: 0 %
EOS (ABSOLUTE): 0.2 10*3/uL (ref 0.0–0.4)
Eos: 2 %
Hematocrit: 38.8 % (ref 34.0–46.6)
Hemoglobin: 12.4 g/dL (ref 11.1–15.9)
Immature Grans (Abs): 0.1 10*3/uL (ref 0.0–0.1)
Immature Granulocytes: 1 %
Lymphocytes Absolute: 2.1 10*3/uL (ref 0.7–3.1)
Lymphs: 28 %
MCH: 24.4 pg — ABNORMAL LOW (ref 26.6–33.0)
MCHC: 32 g/dL (ref 31.5–35.7)
MCV: 76 fL — ABNORMAL LOW (ref 79–97)
Monocytes Absolute: 0.5 10*3/uL (ref 0.1–0.9)
Monocytes: 6 %
Neutrophils Absolute: 4.9 10*3/uL (ref 1.4–7.0)
Neutrophils: 63 %
Platelets: 361 10*3/uL (ref 150–450)
RBC: 5.08 x10E6/uL (ref 3.77–5.28)
RDW: 12.9 % (ref 11.7–15.4)
WBC: 7.7 10*3/uL (ref 3.4–10.8)

## 2022-03-18 LAB — MICROALBUMIN / CREATININE URINE RATIO
Creatinine, Urine: 198.8 mg/dL
Microalb/Creat Ratio: 7 mg/g creat (ref 0–29)
Microalbumin, Urine: 14 ug/mL

## 2022-03-18 LAB — VITAMIN D 25 HYDROXY (VIT D DEFICIENCY, FRACTURES): Vit D, 25-Hydroxy: 58.4 ng/mL (ref 30.0–100.0)

## 2022-03-18 LAB — TSH: TSH: 1.57 u[IU]/mL (ref 0.450–4.500)

## 2022-03-20 ENCOUNTER — Other Ambulatory Visit: Payer: Self-pay | Admitting: Family

## 2022-03-27 ENCOUNTER — Telehealth: Payer: Self-pay | Admitting: Family

## 2022-03-27 NOTE — Telephone Encounter (Signed)
Patient aware and verbalized understanding. Called patient

## 2022-04-17 NOTE — Progress Notes (Unsigned)
Cardiology Office Note:    Date:  04/18/2022   ID:  [name], DOB 10-20-1950, MRN [id number]  PCP:  Sharion Balloon, FNP   [facility] Providers Cardiologist:  Freada Bergeron, MD {   Referring MD: Sharion Balloon, FNP    History of Present Illness:    [name] is a 71 y.o. female with a hx of HTN, DMII, HLD, and moderate MR who was previously followed by Dr. Bronson Ing who now presents to clinic for follow-up.  Saw Levell July in clinic on 05/2020 where he had obtained TTE and myoview due to DOE and chest discomfort. TTE 04/2020 with LVEF 60-65%, normal RV, normal PASP, mild MR, aortic sclerosis. Myoview 04/2020 negative for ischemic or infarction. Given reassuring work-up, thought DOE was likely related to deconditioning.  Was last seen in clinic on 05/2021 where she was doing well from a CV standpoint.   Today, the patient states that she continues to have diffuse myalgias from arthritis. She has been following with her PCP and takes tylenol for pain.  From a CV perspective, she is doing well. No chest pain or significant SOB on exertion. Has occasional LE edema after prolonged standing, but this is stable. No lightheadedness, dizziness or palpitations.   Blood pressure is elevated this morning and she is not sure if she took her medications this morning. She does not monitor her blood pressures at home.  Past Medical History:  Diagnosis Date   Allergic rhinitis    Allergy    Anemia    Ankle swelling    Anxiety    takes Xanax daily    Arthritis    Asthma    Blurred vision, bilateral    Chronic kidney disease    Depression    takes Celexa daily   Diabetes mellitus without complication ([place])    type II; takes Actos daily   GERD (gastroesophageal reflux disease)    Gout    Headache    Heart murmur    Heart palpitations    Hemorrhoids    History of blood transfusion    no abnormal reaction noted   History of bronchitis    Hyperlipidemia     Hypertension    takes Catapres and Quinapril daily   Hypothyroidism    takes Synthroid daily   Moderate mitral insufficiency    a. 12/2014 Echo: EF 55-60%, mod MR, mildly to moderately dil LA, PASP 78mHg.   Myocardial infarction ([facility]    " Minor"   Osteoarthritis    Paroxysmal supraventricular tachycardia    Recurrent ventral incisional hernia    Ringing in ears    Shortness of breath dyspnea    ALbuterol daily as needed   [place]    Urinary incontinence    Wears glasses     Past Surgical History:  Procedure Laterality Date   ABDOMINAL HYSTERECTOMY     BOWEL RESECTION     COLON SURGERY     COLONOSCOPY     COLONOSCOPY WITH PROPOFOL N/A 04/29/2019   Procedure: COLONOSCOPY WITH PROPOFOL;  Surgeon: FDanie Binder MD;  [facility];  Service: Endoscopy;  Laterality: N/A;  7:30am   ESOPHAGOGASTRODUODENOSCOPY     HEMORRHOID SURGERY  1998   HERNIA REPAIR     hiatal hernia repair   HIATAL HERNIA REPAIR  1980   pt is unsure of exact date but thinks it was early 815's  IParisN/A 04/02/2018   Procedure:  OPEN VENTRAL HERNIA REPAIR WITH MESH, Explantation of hernia mesh;  Surgeon: Donnie Mesa, MD;  Location: [place];  Service: General;  Laterality: N/A;   INSERTION OF MESH N/A 04/02/2018   Procedure: INSERTION OF MESH;  Surgeon: Donnie Mesa, MD;  Location: [place];  Service: General;  Laterality: N/A;   POLYPECTOMY  04/29/2019   Procedure: POLYPECTOMY;  Surgeon: Danie Binder, MD;  [facility];  Service: Endoscopy;;   TOTAL HIP ARTHROPLASTY Left 02/02/2015   Procedure: LEFT TOTAL HIP ARTHROPLASTY;  Surgeon: Garald Balding, MD;  Location: [place];  Service: Orthopedics;  Laterality: Left;   TOTAL HIP ARTHROPLASTY Right 02/01/2016   Procedure: TOTAL HIP ARTHROPLASTY;  Surgeon: Garald Balding, MD;  Location: [place];  Service: Orthopedics;  Laterality: Right;    Current Medications: Current Meds  Medication Sig    acetaminophen (TYLENOL) 500 MG tablet Take 1 tablet (500 mg total) by mouth every 6 (six) hours as needed.   albuterol (VENTOLIN HFA) 108 (90 Base) MCG/ACT inhaler TAKE 2 PUFFS BY MOUTH EVERY 6 HOURS AS NEEDED FOR WHEEZE OR SHORTNESS OF BREATH   atorvastatin (LIPITOR) 20 MG tablet TAKE 1 TABLET BY MOUTH EVERY DAY   Budeson-Glycopyrrol-Formoterol (BREZTRI AEROSPHERE) 160-9-4.8 MCG/ACT AERO Inhale 2 puffs into the lungs 2 (two) times daily.   busPIRone (BUSPAR) 5 MG tablet Take 1-2 tablets (5-10 mg total) by mouth 2 (two) times daily.   calcium carbonate (OS-CAL) 1250 (500 Ca) MG chewable tablet Chew 1 tablet by mouth daily.   carvedilol (COREG) 3.125 MG tablet Take 1 tablet (3.125 mg total) by mouth 2 (two) times daily.   cetirizine (ZYRTEC) 10 MG tablet Take 20 mg by mouth daily.   cloNIDine (CATAPRES) 0.1 MG tablet TAKE 1 TABLET BY MOUTH EVERY DAY   docusate sodium (COLACE) 100 MG capsule Take 100 mg by mouth 2 (two) times daily.   famotidine (PEPCID) 20 MG tablet Take 1 tablet (20 mg total) by mouth 2 (two) times daily.   fluticasone (FLONASE) 50 MCG/ACT nasal spray Place 1 spray into both nostrils 2 (two) times daily as needed for allergies or rhinitis.   furosemide (LASIX) 40 MG tablet Take 1 tablet (40 mg total) by mouth daily.   gabapentin (NEURONTIN) 600 MG tablet Take 1 tablet (600 mg total) by mouth 3 (three) times daily.   glucose blood (ONE TOUCH ULTRA TEST) test strip USE TO TEST BLOOD SUGAR 3 TIMES DAILY AS DIRECTED. DX: E11.65   hydrOXYzine (ATARAX) 25 MG tablet Take 1 tablet (25 mg total) by mouth 3 (three) times daily.   insulin lispro (HUMALOG) 100 UNIT/ML injection Inject 0.04-0.1 mLs (4-10 Units total) into the skin 3 (three) times daily before meals.   Insulin Syringe-Needle U-100 (BD INSULIN SYRINGE U/F) 31G X 5/16" 0.5 ML MISC Use to inject lantus and novolog daily dx E11.65   Lancets (ONETOUCH ULTRASOFT) lancets Use as instructed   LANTUS 100 UNIT/ML injection INJECT 0.3-0.4  MLS (30-40 UNITS TOTAL) INTO THE SKIN DAILY.   levothyroxine (SYNTHROID) 75 MCG tablet Take 1 tablet (75 mcg total) by mouth daily.   lisinopril (ZESTRIL) 40 MG tablet Take 1 tablet (40 mg total) by mouth daily.   metFORMIN (GLUCOPHAGE-XR) 750 MG 24 hr tablet TAKE 1 TABLET BY MOUTH EVERY DAY WITH BREAKFAST   montelukast (SINGULAIR) 10 MG tablet TAKE 1 TABLET BY MOUTH EVERY DAY IN THE EVENING   Multiple Vitamins-Minerals (MULTIVITAMIN WITH MINERALS) tablet Take 1 tablet by mouth daily. Centrum Silver  Propylene Glycol (SYSTANE BALANCE OP) Place 1 drop into both eyes daily as needed (Dry eyes).     Allergies:   Other, Aspirin, Jardiance [empagliflozin], Allopurinol, Penicillins, and Sulfa antibiotics   Social History   Socioeconomic History   Marital status: Single    Spouse name: Not on file   Number of children: 0   Years of education: 12   Highest education level: High school graduate  Occupational History   Occupation: retired  Tobacco Use   Smoking status: Never   Smokeless tobacco: Never  Vaping Use   Vaping Use: Never used  Substance and Sexual Activity   Alcohol use: No    Alcohol/week: 0.0 standard drinks of alcohol   Drug use: No   Sexual activity: Not Currently    Birth control/protection: Surgical  Other Topics Concern   Not on file  Social History Narrative   Lives in single level home with her sister.    Social Determinants of Health   Financial Resource Strain: Low Risk  (11/24/2021)   Overall Financial Resource Strain (CARDIA)    Difficulty of Paying Living Expenses: Not hard at all  Food Insecurity: No Food Insecurity (11/24/2021)   Hunger Vital Sign    Worried About Running Out of Food in the Last Year: Never true    Ran Out of Food in the Last Year: Never true  Transportation Needs: No Transportation Needs (11/24/2021)   PRAPARE - Hydrologist (Medical): No    Lack of Transportation (Non-Medical): No  Physical Activity:  Insufficiently Active (11/24/2021)   Exercise Vital Sign    Days of Exercise per Week: 7 days    Minutes of Exercise per Session: 20 min  Stress: No Stress Concern Present (11/24/2021)   [place]    Feeling of Stress : Not at all  Social Connections: Moderately Integrated (11/24/2021)   Social Connection and Isolation Panel [NHANES]    Frequency of Communication with Friends and Family: More than three times a week    Frequency of Social Gatherings with Friends and Family: More than three times a week    Attends Religious Services: 1 to 4 times per year    Active Member of [employer] or Organizations: Yes    Attends Archivist Meetings: 1 to 4 times per year    Marital Status: Never married     Family History: The patient's family history includes COPD in her father; Cancer in her father; Colon cancer in her maternal aunt and paternal aunt; Colon polyps in her mother; Diabetes in her mother; Heart attack in her mother; Hyperlipidemia in her sister; Hypertension in her mother and sister; Kidney disease in her mother.  ROS:   Please see the history of present illness.    Review of Systems  Constitutional:  Negative for chills and fever.  Respiratory:  Positive for shortness of breath and wheezing.   Cardiovascular:  Positive for palpitations. Negative for chest pain, orthopnea, claudication, leg swelling and PND.  Gastrointestinal:  Negative for blood in stool and melena.  Genitourinary:  Negative for hematuria.  Musculoskeletal:  Positive for joint pain.  Neurological:  Positive for dizziness. Negative for loss of consciousness.     EKGs/Labs/Other Studies Reviewed:    The following studies were reviewed today: TTE 05/19/20: IMPRESSIONS   1. Left ventricular ejection fraction, by estimation, is 60 to 65%. The  left ventricle has normal function. Left ventricular endocardial border  not optimally defined to  evaluate regional wall motion. Left ventricular  diastolic parameters are  indeterminate.   2. Right ventricular systolic function is normal. The right ventricular  size is normal. There is normal pulmonary artery systolic pressure. The  estimated right ventricular systolic pressure is 40.9 mmHg.   3. The mitral valve is abnormal. Mild mitral valve regurgitation.   4. The aortic valve is tricuspid. Aortic valve regurgitation is not  visualized. Mild aortic valve sclerosis is present, with no evidence of  aortic valve stenosis.   5. The inferior vena cava is normal in size with greater than 50%  respiratory variability, suggesting right atrial pressure of 3 mmHg.   Comparison(s): Echocardiogram done 01/26/16 showed an EF of 60-65%.   Myoview 04/21/2020: There was no ST segment deviation noted during stress. The study is normal. There are no perfusion defects This is a low risk study. The left ventricular ejection fraction is hyperdynamic (>65%).  EKG:   No new ECG today  Recent Labs: 03/17/2022: ALT 12; BUN 22; Creatinine, Ser 1.69; Hemoglobin 12.4; Platelets 361; Potassium 5.2; Sodium 141; TSH 1.570  Recent Lipid Panel    Component Value Date/Time   CHOL 177 03/17/2022 0940   TRIG 156 (H) 03/17/2022 0940   HDL 36 (L) 03/17/2022 0940   CHOLHDL 4.9 (H) 03/17/2022 0940   LDLCALC 113 (H) 03/17/2022 0940     Risk Assessment/Calculations:           Physical Exam:    VS:  BP (!) 142/80   Pulse 95   Ht '5\' 4"'$  (1.626 m)   Wt 192 lb 4.8 oz (87.2 kg)   SpO2 98%   BMI 33.01 kg/m     Wt Readings from Last 3 Encounters:  04/18/22 192 lb 4.8 oz (87.2 kg)  03/17/22 189 lb (85.7 kg)  12/06/21 190 lb 6.4 oz (86.4 kg)     GEN:  Well nourished, well developed in no acute distress HEENT: Normal NECK: No JVD; No carotid bruits CARDIAC: RRR, no murmurs, rubs, gallops RESPIRATORY:  Clear to auscultation without rales, wheezing or rhonchi  ABDOMEN: Soft, non-tender,  non-distended MUSCULOSKELETAL:  No edema, warm SKIN: Warm and dry NEUROLOGIC:  Alert and oriented x 3 PSYCHIATRIC:  Normal affect   ASSESSMENT:    1. DOE (dyspnea on exertion)   2. Essential hypertension   3. Mixed hyperlipidemia   4. Type 2 diabetes mellitus with complication, with long-term current use of insulin (HCC)   5. Palpitations     PLAN:    In order of problems listed above:  #DOE: Reassuring cardiac work-up. TTE 04/2020 with LVEF 60-65%, normal RV, normal PASP, mild MR, aortic sclerosis. Myoview 04/2020 negative for ischemic or infarction.Symptoms thought mainly due to deconditioning. Doing well currently and remains stable.  #Palpitations: Improved. Mainly flare with use of her inhalers -Continue coreg 3.'125mg'$  BID  #HTN: Elevated in the office today in the setting of missing her morning medications. Was well controlled at PCP office. Will have her monitor at home and let us know if >130/90 on average. -Continue lisinopril '40mg'$  daily -Continue clonidine daily -Continue lasix '40mg'$  daily -Continue coreg 3.'125mg'$  BID   #HLD: LDL 113. Has not been taking the lipitor daily. She will take it daily and we can repeat lipids with her PCP. -Encouraged her to take lipitor '20mg'$  daily (has been missing doses) -Ideally, LDL goal <70 -Will repeat lipids with PCP per patient preference     #DMII: Managed by PCP. Did not  tolerate jardiance due to yeast infections. -Management per PCP -On insulin and metformin    Medication Adjustments/Labs and Tests Ordered: Current medicines are reviewed at length with the patient today.  Concerns regarding medicines are outlined above.  No orders of the defined types were placed in this encounter.  No orders of the defined types were placed in this encounter.   Patient Instructions  Medication Instructions:  Your physician recommends that you continue on your current medications as directed. Please refer to the Current Medication  list given to you today.  *If you need a refill on your cardiac medications before your next appointment, please call your pharmacy*   Lab Work: NONE   If you have labs (blood work) drawn today and your tests are completely normal, you will receive your results only by: [place] (if you have MyChart) OR A paper copy in the mail If you have any lab test that is abnormal or we need to change your treatment, we will call you to review the results.   Testing/Procedures: NONE   Follow-Up: At [facility], you and your health needs are our priority.  As part of our continuing mission to provide you with exceptional heart care, we have created designated Provider Care Teams.  These Care Teams include your primary Cardiologist (physician) and Advanced Practice Providers (APPs -  Physician Assistants and Nurse Practitioners) who all work together to provide you with the care you need, when you need it.  We recommend signing up for the patient portal called "MyChart".  Sign up information is provided on this After Visit Summary.  MyChart is used to connect with patients for Virtual Visits (Telemedicine).  Patients are able to view lab/test results, encounter notes, upcoming appointments, etc.  Non-urgent messages can be sent to your provider as well.   To learn more about what you can do with MyChart, go to [email or web address].    Your next appointment:   6 month(s)  The format for your next appointment:   In Person  Provider:   You may see Freada Bergeron, MD or one of the following Advanced Practice Providers on your designated Care Team:   Bernerd Pho, PA-C  Ermalinda Barrios, PA-C     Other Instructions Thank you for choosing [place]!    Important Information About Sugar         Signed, Freada Bergeron, MD  04/18/2022 9:35 AM    [place]

## 2022-04-18 ENCOUNTER — Encounter: Payer: Self-pay | Admitting: Cardiology

## 2022-04-18 ENCOUNTER — Ambulatory Visit: Payer: Medicare Other | Attending: Cardiology | Admitting: Cardiology

## 2022-04-18 VITALS — BP 142/80 | HR 95 | Ht 64.0 in | Wt 192.3 lb

## 2022-04-18 DIAGNOSIS — R002 Palpitations: Secondary | ICD-10-CM

## 2022-04-18 DIAGNOSIS — E118 Type 2 diabetes mellitus with unspecified complications: Secondary | ICD-10-CM | POA: Diagnosis not present

## 2022-04-18 DIAGNOSIS — R0609 Other forms of dyspnea: Secondary | ICD-10-CM

## 2022-04-18 DIAGNOSIS — Z794 Long term (current) use of insulin: Secondary | ICD-10-CM | POA: Diagnosis not present

## 2022-04-18 DIAGNOSIS — E782 Mixed hyperlipidemia: Secondary | ICD-10-CM | POA: Diagnosis not present

## 2022-04-18 DIAGNOSIS — I10 Essential (primary) hypertension: Secondary | ICD-10-CM | POA: Diagnosis not present

## 2022-04-18 NOTE — Patient Instructions (Signed)
Medication Instructions:  Your physician recommends that you continue on your current medications as directed. Please refer to the Current Medication list given to you today.  *If you need a refill on your cardiac medications before your next appointment, please call your pharmacy*   Lab Work: NONE   If you have labs (blood work) drawn today and your tests are completely normal, you will receive your results only by: [place] (if you have MyChart) OR A paper copy in the mail If you have any lab test that is abnormal or we need to change your treatment, we will call you to review the results.   Testing/Procedures: NONE   Follow-Up: At [facility], [place], you and your health needs are our priority.  As part of our continuing mission to provide you with exceptional heart care, we have created designated Provider Care Teams.  These Care Teams include your primary Cardiologist (physician) and Advanced Practice Providers (APPs -  Physician Assistants and Nurse Practitioners) who all work together to provide you with the care you need, when you need it.  We recommend signing up for the patient portal called "MyChart".  Sign up information is provided on this After Visit Summary.  MyChart is used to connect with patients for Virtual Visits (Telemedicine).  Patients are able to view lab/test results, encounter notes, upcoming appointments, etc.  Non-urgent messages can be sent to your provider as well.   To learn more about what you can do with MyChart, go to [email or web address].    Your next appointment:   6 month(s)  The format for your next appointment:   In Person  Provider:   You may see Freada Bergeron, MD or one of the following Advanced Practice Providers on your designated Care Team:   Bernerd Pho, PA-C  Ermalinda Barrios, PA-C     Other Instructions Thank you for choosing [facility]!    Important Information About Sugar

## 2022-04-23 ENCOUNTER — Other Ambulatory Visit: Payer: Self-pay | Admitting: Family

## 2022-04-26 ENCOUNTER — Other Ambulatory Visit: Payer: Self-pay | Admitting: Family

## 2022-05-11 ENCOUNTER — Other Ambulatory Visit: Payer: Self-pay | Admitting: Family

## 2022-05-13 ENCOUNTER — Other Ambulatory Visit: Payer: Self-pay | Admitting: Family

## 2022-05-13 DIAGNOSIS — F411 Generalized anxiety disorder: Secondary | ICD-10-CM

## 2022-05-18 ENCOUNTER — Ambulatory Visit: Payer: Medicare Other | Admitting: Cardiology

## 2022-05-22 ENCOUNTER — Other Ambulatory Visit: Payer: Self-pay | Admitting: Family

## 2022-05-22 DIAGNOSIS — E1065 Type 1 diabetes mellitus with hyperglycemia: Secondary | ICD-10-CM

## 2022-05-27 ENCOUNTER — Other Ambulatory Visit: Payer: Self-pay | Admitting: Family

## 2022-05-27 DIAGNOSIS — I10 Essential (primary) hypertension: Secondary | ICD-10-CM

## 2022-06-01 ENCOUNTER — Other Ambulatory Visit: Payer: Self-pay | Admitting: Family

## 2022-06-06 ENCOUNTER — Other Ambulatory Visit: Payer: Self-pay | Admitting: Family

## 2022-06-19 ENCOUNTER — Ambulatory Visit: Payer: Medicare Other | Admitting: Family

## 2022-06-20 ENCOUNTER — Ambulatory Visit: Payer: Medicare Other | Admitting: Family

## 2022-06-22 ENCOUNTER — Other Ambulatory Visit: Payer: Self-pay | Admitting: Family

## 2022-07-05 ENCOUNTER — Other Ambulatory Visit: Payer: Self-pay | Admitting: Family

## 2022-07-05 DIAGNOSIS — E1165 Type 2 diabetes mellitus with hyperglycemia: Secondary | ICD-10-CM

## 2022-07-06 ENCOUNTER — Ambulatory Visit (INDEPENDENT_AMBULATORY_CARE_PROVIDER_SITE_OTHER): Payer: 59 | Admitting: Family

## 2022-07-06 ENCOUNTER — Encounter: Payer: Self-pay | Admitting: Family

## 2022-07-06 VITALS — BP 135/73 | HR 74 | Temp 97.3°F | Ht 64.0 in | Wt 190.8 lb

## 2022-07-06 DIAGNOSIS — I10 Essential (primary) hypertension: Secondary | ICD-10-CM | POA: Diagnosis not present

## 2022-07-06 DIAGNOSIS — E039 Hypothyroidism, unspecified: Secondary | ICD-10-CM

## 2022-07-06 DIAGNOSIS — E1169 Type 2 diabetes mellitus with other specified complication: Secondary | ICD-10-CM

## 2022-07-06 DIAGNOSIS — E559 Vitamin D deficiency, unspecified: Secondary | ICD-10-CM | POA: Diagnosis not present

## 2022-07-06 DIAGNOSIS — M159 Polyosteoarthritis, unspecified: Secondary | ICD-10-CM | POA: Diagnosis not present

## 2022-07-06 DIAGNOSIS — F411 Generalized anxiety disorder: Secondary | ICD-10-CM | POA: Diagnosis not present

## 2022-07-06 DIAGNOSIS — E1149 Type 2 diabetes mellitus with other diabetic neurological complication: Secondary | ICD-10-CM

## 2022-07-06 DIAGNOSIS — E669 Obesity, unspecified: Secondary | ICD-10-CM

## 2022-07-06 DIAGNOSIS — Z794 Long term (current) use of insulin: Secondary | ICD-10-CM

## 2022-07-06 DIAGNOSIS — N1831 Chronic kidney disease, stage 3a: Secondary | ICD-10-CM

## 2022-07-06 DIAGNOSIS — J4520 Mild intermittent asthma, uncomplicated: Secondary | ICD-10-CM

## 2022-07-06 DIAGNOSIS — E1140 Type 2 diabetes mellitus with diabetic neuropathy, unspecified: Secondary | ICD-10-CM | POA: Insufficient documentation

## 2022-07-06 LAB — BAYER DCA HB A1C WAIVED: HB A1C (BAYER DCA - WAIVED): 6.6 % — ABNORMAL HIGH (ref 4.8–5.6)

## 2022-07-06 MED ORDER — GABAPENTIN 800 MG PO TABS: 800.0000 mg | ORAL_TABLET | Freq: Three times a day (TID) | ORAL | 2 refills | Status: DC

## 2022-07-06 MED ORDER — ONETOUCH ULTRA VI STRP: ORAL_STRIP | 12 refills | Status: DC

## 2022-07-06 MED ORDER — BREZTRI AEROSPHERE 160-9-4.8 MCG/ACT IN AERO: 2.0000 | INHALATION_SPRAY | Freq: Two times a day (BID) | RESPIRATORY_TRACT | 11 refills | Status: DC

## 2022-07-06 MED ORDER — ATORVASTATIN CALCIUM 20 MG PO TABS: 20.0000 mg | ORAL_TABLET | Freq: Every day | ORAL | 0 refills | Status: DC

## 2022-07-06 NOTE — Progress Notes (Signed)
Subjective:    Patient ID: [name], female    DOB: 06-11-1951, 72 y.o.   MRN: [id number]  Chief Complaint  Patient presents with   Medical Management of Chronic Issues    WANTS GABAPENTIN STRONGER    Pt calls the office today for chronic follow up. She is followed Cardiologists annually for mitral insufficiency.   She has CKD and avoids NSAID's.  Diabetes She presents for her follow-up diabetic visit. She has type 2 diabetes mellitus. Hypoglycemia symptoms include nervousness/anxiousness. Associated symptoms include fatigue and foot paresthesias. Pertinent negatives for diabetes include no blurred vision. Symptoms are stable. Diabetic complications include peripheral neuropathy. Risk factors for coronary artery disease include dyslipidemia, diabetes mellitus, hypertension, sedentary lifestyle and post-menopausal. She is following a generally healthy diet. Her overall blood glucose range is 110-130 mg/dl.  Hypertension This is a chronic problem. The current episode started more than 1 year ago. The problem has been resolved since onset. The problem is controlled. Associated symptoms include anxiety, malaise/fatigue, peripheral edema (when walking on them) and shortness of breath. Pertinent negatives include no blurred vision. Risk factors for coronary artery disease include dyslipidemia, diabetes mellitus and sedentary lifestyle. The current treatment provides moderate improvement. Identifiable causes of hypertension include a thyroid problem.  Asthma She complains of hoarse voice, shortness of breath and wheezing. There is no cough. This is a chronic problem. The current episode started more than 1 year ago. The problem occurs intermittently. Associated symptoms include malaise/fatigue. She reports moderate improvement on treatment. Her past medical history is significant for asthma.  Thyroid Problem Presents for follow-up visit. Symptoms include anxiety, fatigue and hoarse voice. Patient  reports no constipation or diarrhea. The symptoms have been stable.  Arthritis Presents for follow-up visit. She complains of pain and stiffness. Affected locations include the left knee, right knee, right hip, left hip, left shoulder and right shoulder. Her pain is at a severity of 10/10. Associated symptoms include fatigue. Pertinent negatives include no diarrhea.  Anxiety Presents for follow-up visit. Symptoms include excessive worry, nervous/anxious behavior and shortness of breath. Symptoms occur occasionally. The severity of symptoms is moderate.   Her past medical history is significant for anemia and asthma.  Anemia Presents for follow-up visit. Symptoms include malaise/fatigue.      Review of Systems  Constitutional:  Positive for fatigue and malaise/fatigue.  HENT:  Positive for hoarse voice.   Eyes:  Negative for blurred vision.  Respiratory:  Positive for shortness of breath and wheezing. Negative for cough.   Gastrointestinal:  Negative for constipation and diarrhea.  Musculoskeletal:  Positive for arthritis and stiffness.  Psychiatric/Behavioral:  The patient is nervous/anxious.   All other systems reviewed and are negative.      Objective:   Physical Exam Vitals reviewed.  Constitutional:      General: She is not in acute distress.    Appearance: She is well-developed. She is obese.  HENT:     Head: Normocephalic and atraumatic.     Right Ear: Tympanic membrane normal.     Left Ear: Tympanic membrane normal.  Eyes:     Pupils: Pupils are equal, round, and reactive to light.  Neck:     Thyroid: No thyromegaly.  Cardiovascular:     Rate and Rhythm: Normal rate and regular rhythm.     Heart sounds: Normal heart sounds. No murmur heard. Pulmonary:     Effort: Pulmonary effort is normal. No respiratory distress.     Breath sounds: Normal breath sounds.  No wheezing.  Abdominal:     General: Bowel sounds are normal. There is no distension.     Palpations:  Abdomen is soft.     Tenderness: There is no abdominal tenderness.  Musculoskeletal:        General: No tenderness. Normal range of motion.     Cervical back: Normal range of motion and neck supple.  Skin:    General: Skin is warm and dry.  Neurological:     Mental Status: She is alert and oriented to person, place, and time.     Cranial Nerves: No cranial nerve deficit.     Deep Tendon Reflexes: Reflexes are normal and symmetric.  Psychiatric:        Behavior: Behavior normal.        Thought Content: Thought content normal.        Judgment: Judgment normal.     Diabetic Foot Exam - Simple   Simple Foot Form Diabetic Foot exam was performed with the following findings: Yes 07/06/2022 12:28 PM  Visual Inspection No deformities, no ulcerations, no other skin breakdown bilaterally: Yes Sensation Testing Intact to touch and monofilament testing bilaterally: Yes Pulse Check Posterior Tibialis and Dorsalis pulse intact bilaterally: Yes Comments      BP 135/73 Comment: patient reports this AM  Pulse 74   Temp (!) 97.3 F (36.3 C) (Temporal)   Ht '5\' 4"'$  (1.626 m)   Wt 190 lb 12.8 oz (86.5 kg)   SpO2 96%   BMI 32.75 kg/m      Assessment & Plan:  [name] comes in today with chief complaint of Medical Management of Chronic Issues (WANTS GABAPENTIN STRONGER )   Diagnosis and orders addressed:  1. Mild intermittent asthma, unspecified whether complicated - Budeson-Glycopyrrol-Formoterol (BREZTRI AEROSPHERE) 160-9-4.8 MCG/ACT AERO; Inhale 2 puffs into the lungs 2 (two) times daily.  Dispense: 10.7 g; Refill: 11 - CMP14+EGFR  2. Stage 3a chronic kidney disease (HCC) - CMP14+EGFR  3. Generalized anxiety disorder - CMP14+EGFR  4. Primary hypertension - CMP14+EGFR  5. Vitamin D deficiency  - CMP14+EGFR  6. Type 2 diabetes mellitus with other specified complication, with long-term current use of insulin (HCC) - glucose blood (ONETOUCH ULTRA) test strip; USE TO TEST  BLOOD SUGAR 3 TIMES DAILY AS DIRECTED. DX: E11.65  Dispense: 100 strip; Refill: 12 - gabapentin (NEURONTIN) 800 MG tablet; Take 1 tablet (800 mg total) by mouth 3 (three) times daily.  Dispense: 90 tablet; Refill: 2 - CMP14+EGFR - Bayer DCA Hb A1c Waived  7. Generalized OA - CMP14+EGFR  8. Hypothyroidism, unspecified type - CMP14+EGFR  9. Obesity (BMI 30-39.9) - CMP14+EGFR  10. Other diabetic neurological complication associated with type 2 diabetes mellitus ([place]) Will increase gabapentin to 800 mg TID from 600 mg    Labs pending Health Maintenance reviewed Diet and exercise encouraged  Follow up plan: 3 months    Evelina Dun, FNP

## 2022-07-06 NOTE — Patient Instructions (Signed)
Health Maintenance After Age 71 After age 71, you are at a higher risk for certain long-term diseases and infections as well as injuries from falls. Falls are a major cause of broken bones and head injuries in people who are older than age 71. Getting regular preventive care can help to keep you healthy and well. Preventive care includes getting regular testing and making lifestyle changes as recommended by your health care provider. Talk with your health care provider about: Which screenings and tests you should have. A screening is a test that checks for a disease when you have no symptoms. A diet and exercise plan that is right for you. What should I know about screenings and tests to prevent falls? Screening and testing are the best ways to find a health problem early. Early diagnosis and treatment give you the best chance of managing medical conditions that are common after age 71. Certain conditions and lifestyle choices may make you more likely to have a fall. Your health care provider may recommend: Regular vision checks. Poor vision and conditions such as cataracts can make you more likely to have a fall. If you wear glasses, make sure to get your prescription updated if your vision changes. Medicine review. Work with your health care provider to regularly review all of the medicines you are taking, including over-the-counter medicines. Ask your health care provider about any side effects that may make you more likely to have a fall. Tell your health care provider if any medicines that you take make you feel dizzy or sleepy. Strength and balance checks. Your health care provider may recommend certain tests to check your strength and balance while standing, walking, or changing positions. Foot health exam. Foot pain and numbness, as well as not wearing proper footwear, can make you more likely to have a fall. Screenings, including: Osteoporosis screening. Osteoporosis is a condition that causes  the bones to get weaker and break more easily. Blood pressure screening. Blood pressure changes and medicines to control blood pressure can make you feel dizzy. Depression screening. You may be more likely to have a fall if you have a fear of falling, feel depressed, or feel unable to do activities that you used to do. Alcohol use screening. Using too much alcohol can affect your balance and may make you more likely to have a fall. Follow these instructions at home: Lifestyle Do not drink alcohol if: Your health care provider tells you not to drink. If you drink alcohol: Limit how much you have to: 0-1 drink a day for women. 0-2 drinks a day for men. Know how much alcohol is in your drink. In the [place] one drink equals one 12 oz bottle of beer (355 mL), one 5 oz glass of wine (148 mL), or one 1 oz glass of hard liquor (44 mL). Do not use any products that contain nicotine or tobacco. These products include cigarettes, chewing tobacco, and vaping devices, such as e-cigarettes. If you need help quitting, ask your health care provider. Activity  Follow a regular exercise program to stay fit. This will help you maintain your balance. Ask your health care provider what types of exercise are appropriate for you. If you need a cane or walker, use it as recommended by your health care provider. Wear supportive shoes that have nonskid soles. Safety  Remove any tripping hazards, such as rugs, cords, and clutter. Install safety equipment such as grab bars in bathrooms and safety rails on stairs. Keep rooms and walkways   well-lit. General instructions Talk with your health care provider about your risks for falling. Tell your health care provider if: You fall. Be sure to tell your health care provider about all falls, even ones that seem minor. You feel dizzy, tiredness (fatigue), or off-balance. Take over-the-counter and prescription medicines only as told by your health care provider. These include  supplements. Eat a healthy diet and maintain a healthy weight. A healthy diet includes low-fat dairy products, low-fat (lean) meats, and fiber from whole grains, beans, and lots of fruits and vegetables. Stay current with your vaccines. Schedule regular health, dental, and eye exams. Summary Having a healthy lifestyle and getting preventive care can help to protect your health and wellness after age 71. Screening and testing are the best way to find a health problem early and help you avoid having a fall. Early diagnosis and treatment give you the best chance for managing medical conditions that are more common for people who are older than age 71. Falls are a major cause of broken bones and head injuries in people who are older than age 71. Take precautions to prevent a fall at home. Work with your health care provider to learn what changes you can make to improve your health and wellness and to prevent falls. This information is not intended to replace advice given to you by your health care provider. Make sure you discuss any questions you have with your health care provider. Document Revised: 10/18/2020 Document Reviewed: 10/18/2020 Elsevier Patient Education  2023 [employer]  

## 2022-07-07 LAB — CMP14+EGFR
ALT: 13 IU/L (ref 0–32)
AST: 15 IU/L (ref 0–40)
Albumin/Globulin Ratio: 1.5 (ref 1.2–2.2)
Albumin: 4.4 g/dL (ref 3.9–4.9)
Alkaline Phosphatase: 168 IU/L — ABNORMAL HIGH (ref 44–121)
BUN/Creatinine Ratio: 16 (ref 12–28)
BUN: 23 mg/dL (ref 8–27)
Bilirubin Total: 0.2 mg/dL (ref 0.0–1.2)
CO2: 22 mmol/L (ref 20–29)
Calcium: 9.8 mg/dL (ref 8.7–10.3)
Chloride: 103 mmol/L (ref 96–106)
Creatinine, Ser: 1.43 mg/dL — ABNORMAL HIGH (ref 0.57–1.00)
Globulin, Total: 2.9 g/dL (ref 1.5–4.5)
Glucose: 83 mg/dL (ref 70–99)
Potassium: 4.7 mmol/L (ref 3.5–5.2)
Sodium: 141 mmol/L (ref 134–144)
Total Protein: 7.3 g/dL (ref 6.0–8.5)
eGFR: 39 mL/min/{1.73_m2} — ABNORMAL LOW (ref >59)

## 2022-07-19 ENCOUNTER — Other Ambulatory Visit: Payer: Self-pay | Admitting: Family

## 2022-07-19 ENCOUNTER — Other Ambulatory Visit: Payer: Self-pay

## 2022-07-19 MED ORDER — CARVEDILOL 3.125 MG PO TABS: 3.1250 mg | ORAL_TABLET | Freq: Two times a day (BID) | ORAL | 3 refills | Status: DC

## 2022-07-20 ENCOUNTER — Other Ambulatory Visit: Payer: Self-pay | Admitting: Family

## 2022-07-20 DIAGNOSIS — I10 Essential (primary) hypertension: Secondary | ICD-10-CM

## 2022-07-23 ENCOUNTER — Other Ambulatory Visit: Payer: Self-pay | Admitting: Family

## 2022-08-09 ENCOUNTER — Other Ambulatory Visit: Payer: Self-pay | Admitting: Family

## 2022-08-09 DIAGNOSIS — K219 Gastro-esophageal reflux disease without esophagitis: Secondary | ICD-10-CM

## 2022-08-10 ENCOUNTER — Other Ambulatory Visit: Payer: Self-pay | Admitting: Family

## 2022-08-10 DIAGNOSIS — F411 Generalized anxiety disorder: Secondary | ICD-10-CM

## 2022-08-17 ENCOUNTER — Telehealth: Payer: Self-pay | Admitting: Family

## 2022-08-17 NOTE — Telephone Encounter (Signed)
Left message to call back  

## 2022-08-17 NOTE — Telephone Encounter (Signed)
We can scheduled every 4  to 6 months. Her last A1C was at goal.   Evelina Dun, FNP

## 2022-08-22 ENCOUNTER — Other Ambulatory Visit: Payer: Self-pay | Admitting: Family

## 2022-08-22 DIAGNOSIS — E1065 Type 1 diabetes mellitus with hyperglycemia: Secondary | ICD-10-CM

## 2022-08-29 ENCOUNTER — Other Ambulatory Visit: Payer: Self-pay | Admitting: Family

## 2022-08-29 DIAGNOSIS — I10 Essential (primary) hypertension: Secondary | ICD-10-CM

## 2022-09-11 ENCOUNTER — Other Ambulatory Visit: Payer: Self-pay | Admitting: Family

## 2022-09-16 ENCOUNTER — Other Ambulatory Visit: Payer: Self-pay | Admitting: Family

## 2022-10-05 ENCOUNTER — Ambulatory Visit: Payer: 59 | Admitting: Family

## 2022-10-17 ENCOUNTER — Other Ambulatory Visit: Payer: Self-pay | Admitting: Family

## 2022-10-17 DIAGNOSIS — E1065 Type 1 diabetes mellitus with hyperglycemia: Secondary | ICD-10-CM

## 2022-10-19 ENCOUNTER — Other Ambulatory Visit: Payer: Self-pay | Admitting: Family

## 2022-10-19 DIAGNOSIS — I10 Essential (primary) hypertension: Secondary | ICD-10-CM

## 2022-10-20 ENCOUNTER — Other Ambulatory Visit: Payer: Self-pay | Admitting: Family

## 2022-10-30 ENCOUNTER — Telehealth: Payer: Self-pay | Admitting: Family

## 2022-10-30 DIAGNOSIS — Z794 Long term (current) use of insulin: Secondary | ICD-10-CM

## 2022-10-30 NOTE — Telephone Encounter (Signed)
Pt wants to know if Neysa Bonito will increase dosage on gabapentin (NEURONTIN) 800 MG tablet. Pt aware Neysa Bonito will address on Thursday.  Use [facility]

## 2022-11-03 MED ORDER — GABAPENTIN 800 MG PO TABS
800.0000 mg | ORAL_TABLET | Freq: Four times a day (QID) | ORAL | 2 refills | Status: DC
Start: 2022-11-03 — End: 2023-02-20

## 2022-11-03 NOTE — Telephone Encounter (Signed)
Patient aware and verbalized understanding. Wants to go ahead and changed it to 4 a day send in

## 2022-11-03 NOTE — Telephone Encounter (Signed)
She is on max dose, but can increase to QID from TID if she wishes.   Jannifer Rodney, FNP

## 2022-11-04 ENCOUNTER — Other Ambulatory Visit: Payer: Self-pay | Admitting: Family

## 2022-11-04 DIAGNOSIS — K219 Gastro-esophageal reflux disease without esophagitis: Secondary | ICD-10-CM

## 2022-11-08 ENCOUNTER — Other Ambulatory Visit: Payer: Self-pay | Admitting: Family

## 2022-11-08 DIAGNOSIS — F411 Generalized anxiety disorder: Secondary | ICD-10-CM

## 2022-11-19 ENCOUNTER — Other Ambulatory Visit: Payer: Self-pay | Admitting: Family

## 2022-11-23 ENCOUNTER — Other Ambulatory Visit: Payer: Self-pay | Admitting: Family

## 2022-11-23 DIAGNOSIS — E1065 Type 1 diabetes mellitus with hyperglycemia: Secondary | ICD-10-CM

## 2022-11-24 ENCOUNTER — Other Ambulatory Visit: Payer: Self-pay | Admitting: Family

## 2022-11-24 DIAGNOSIS — I10 Essential (primary) hypertension: Secondary | ICD-10-CM

## 2022-11-27 ENCOUNTER — Ambulatory Visit (INDEPENDENT_AMBULATORY_CARE_PROVIDER_SITE_OTHER): Payer: 59

## 2022-11-27 VITALS — Ht 64.0 in | Wt 190.0 lb

## 2022-11-27 DIAGNOSIS — Z0000 Encounter for general adult medical examination without abnormal findings: Secondary | ICD-10-CM

## 2022-11-27 NOTE — Patient Instructions (Signed)
[name] , Thank you for taking time to come for your Medicare Wellness Visit. I appreciate your ongoing commitment to your health goals. Please review the following plan we discussed and let me know if I can assist you in the future.   These are the goals we discussed:  Goals       DIET - INCREASE WATER INTAKE      Patient Stated (pt-stated)      "getting back with God and worshiping him after this virus"      Patient Stated      11/24/2021 AWV Goal: Exercise for General Health  Patient will verbalize understanding of the benefits of increased physical activity: Exercising regularly is important. It will improve your overall fitness, flexibility, and endurance. Regular exercise also will improve your overall health. It can help you control your weight, reduce stress, and improve your bone density. Over the next year, patient will increase physical activity as tolerated with a goal of at least 150 minutes of moderate physical activity per week.  You can tell that you are exercising at a moderate intensity if your heart starts beating faster and you start breathing faster but can still hold a conversation. Moderate-intensity exercise ideas include: Walking 1 mile (1.6 km) in about 15 minutes Biking Hiking Golfing Dancing Water aerobics Patient will verbalize understanding of everyday activities that increase physical activity by providing examples like the following: Yard work, such as: Insurance underwriter Gardening Washing windows or floors Patient will be able to explain general safety guidelines for exercising:  Before you start a new exercise program, talk with your health care provider. Do not exercise so much that you hurt yourself, feel dizzy, or get very short of breath. Wear comfortable clothes and wear shoes with good support. Drink plenty of water while you exercise to prevent dehydration or  heat stroke. Work out until your breathing and your heartbeat get faster.       Patient Stated      Nurse advised patient to contact PCP office to schedule AVW.      T2DM (pt-stated)      Current Barriers:  Unable to maintain control of T2DM Suboptimal therapeutic regimen for T2DM  Pharmacist Clinical Goal(s):  Over the next 90 days, patient will maintain control of T2DM as evidenced by NO HYPOGLYCEMIA--TARGET RANGE BLOOD SUGARS  through collaboration with PharmD and provider.    Interventions: 1:1 collaboration with Junie Spencer, FNP regarding development and update of comprehensive plan of care as evidenced by provider attestation and co-signature Inter-disciplinary care team collaboration (see longitudinal plan of care) Comprehensive medication review performed; medication list updated in electronic medical record  Diabetes: Goal on Track (progressing): YES. Controlled-A1C 5.8%--current treatment: LANTUS 30-40 UNITS, METFORMIN, HOLDING HUMALOG HOLD HUMALOG  CONTINUE METFORMIN--cautious w/ GFR, would prefer patient be on SGLT2 but cost is an issue Patient not amenable to GLP1/has tried rybelsus, but prefers insulin/metformin Current glucose readings: fasting glucose: <130, post prandial glucose: UP TO 200 Is not using libre due to "falling off in the bed" Requested patient come in and we can work on solutions to it falling in Would like for patient to use Josephine Igo since she has experienced hyper & hypoglycemia in recent past Discussed meal planning options and Plate method for healthy eating Avoid sugary drinks and desserts Incorporate balanced protein, non starchy veggies, 1 serving of carbohydrate with each meal Increase water intake Increase  physical activity as able Current exercise: n/a Recommended  drop humalog, continue lantus, continue metformin   Patient Goals/Self-Care Activities Over the next 90 days, patient will:  - take medications as prescribed check glucose  3 times daily & if symptomatic, document, and provide at future appointments  Follow Up Plan: Telephone follow up appointment with care management team member scheduled for: 04/19/21         This is a list of the screening recommended for you and due dates:  Health Maintenance  Topic Date Due   Eye exam for diabetics  10/20/2022   Hemoglobin A1C  01/04/2023   Flu Shot  01/11/2023   Yearly kidney health urinalysis for diabetes  03/18/2023   Yearly kidney function blood test for diabetes  07/07/2023   Complete foot exam   07/07/2023   DEXA scan (bone density measurement)  09/16/2023   Medicare Annual Wellness Visit  11/27/2023   Colon Cancer Screening  04/28/2024   DTaP/Tdap/Td vaccine (2 - Td or Tdap) 10/26/2029   Pneumonia Vaccine  Completed   COVID-19 Vaccine  Completed   Hepatitis C Screening  Completed   Zoster (Shingles) Vaccine  Completed   HPV Vaccine  Aged Out   Mammogram  Discontinued    Advanced directives: Advance directive discussed with you today. I have provided a copy for you to complete at home and have notarized. Once this is complete please bring a copy in to our office so we can scan it into your chart.   Conditions/risks identified: Aim for 30 minutes of exercise or brisk walking, 6-8 glasses of water, and 5 servings of fruits and vegetables each day.   Next appointment: Follow up in one year for your annual wellness visit    Preventive Care 65 Years and Older, Female Preventive care refers to lifestyle choices and visits with your health care provider that can promote health and wellness. What does preventive care include? A yearly physical exam. This is also called an annual well check. Dental exams once or twice a year. Routine eye exams. Ask your health care provider how often you should have your eyes checked. Personal lifestyle choices, including: Daily care of your teeth and gums. Regular physical activity. Eating a healthy diet. Avoiding tobacco  and drug use. Limiting alcohol use. Practicing safe sex. Taking low-dose aspirin every day. Taking vitamin and mineral supplements as recommended by your health care provider. What happens during an annual well check? The services and screenings done by your health care provider during your annual well check will depend on your age, overall health, lifestyle risk factors, and family history of disease. Counseling  Your health care provider may ask you questions about your: Alcohol use. Tobacco use. Drug use. Emotional well-being. Home and relationship well-being. Sexual activity. Eating habits. History of falls. Memory and ability to understand (cognition). Work and work Astronomer. Reproductive health. Screening  You may have the following tests or measurements: Height, weight, and BMI. Blood pressure. Lipid and cholesterol levels. These may be checked every 5 years, or more frequently if you are over 70 years old. Skin check. Lung cancer screening. You may have this screening every year starting at age 72 if you have a 30-pack-year history of smoking and currently smoke or have quit within the past 15 years. Fecal occult blood test (FOBT) of the stool. You may have this test every year starting at age 56. Flexible sigmoidoscopy or colonoscopy. You may have a sigmoidoscopy every 5 years or a colonoscopy  every 10 years starting at age 16. Hepatitis C blood test. Hepatitis B blood test. Sexually transmitted disease (STD) testing. Diabetes screening. This is done by checking your blood sugar (glucose) after you have not eaten for a while (fasting). You may have this done every 1-3 years. Bone density scan. This is done to screen for osteoporosis. You may have this done starting at age 47. Mammogram. This may be done every 1-2 years. Talk to your health care provider about how often you should have regular mammograms. Talk with your health care provider about your test results,  treatment options, and if necessary, the need for more tests. Vaccines  Your health care provider may recommend certain vaccines, such as: Influenza vaccine. This is recommended every year. Tetanus, diphtheria, and acellular pertussis (Tdap, Td) vaccine. You may need a Td booster every 10 years. Zoster vaccine. You may need this after age 65. Pneumococcal 13-valent conjugate (PCV13) vaccine. One dose is recommended after age 11. Pneumococcal polysaccharide (PPSV23) vaccine. One dose is recommended after age 67. Talk to your health care provider about which screenings and vaccines you need and how often you need them. This information is not intended to replace advice given to you by your health care provider. Make sure you discuss any questions you have with your health care provider. Document Released: 06/25/2015 Document Revised: 02/16/2016 Document Reviewed: 03/30/2015 Elsevier Interactive Patient Education  2017 [employer].  Fall Prevention in the Home Falls can cause injuries. They can happen to people of all ages. There are many things you can do to make your home safe and to help prevent falls. What can I do on the outside of my home? Regularly fix the edges of walkways and driveways and fix any cracks. Remove anything that might make you trip as you walk through a door, such as a raised step or threshold. Trim any bushes or trees on the path to your home. Use bright outdoor lighting. Clear any walking paths of anything that might make someone trip, such as rocks or tools. Regularly check to see if handrails are loose or broken. Make sure that both sides of any steps have handrails. Any raised decks and porches should have guardrails on the edges. Have any leaves, snow, or ice cleared regularly. Use sand or salt on walking paths during winter. Clean up any spills in your garage right away. This includes oil or grease spills. What can I do in the bathroom? Use night  lights. Install grab bars by the toilet and in the tub and shower. Do not use towel bars as grab bars. Use non-skid mats or decals in the tub or shower. If you need to sit down in the shower, use a plastic, non-slip stool. Keep the floor dry. Clean up any water that spills on the floor as soon as it happens. Remove soap buildup in the tub or shower regularly. Attach bath mats securely with double-sided non-slip rug tape. Do not have throw rugs and other things on the floor that can make you trip. What can I do in the bedroom? Use night lights. Make sure that you have a light by your bed that is easy to reach. Do not use any sheets or blankets that are too big for your bed. They should not hang down onto the floor. Have a firm chair that has side arms. You can use this for support while you get dressed. Do not have throw rugs and other things on the floor that can make  you trip. What can I do in the kitchen? Clean up any spills right away. Avoid walking on wet floors. Keep items that you use a lot in easy-to-reach places. If you need to reach something above you, use a strong step stool that has a grab bar. Keep electrical cords out of the way. Do not use floor polish or wax that makes floors slippery. If you must use wax, use non-skid floor wax. Do not have throw rugs and other things on the floor that can make you trip. What can I do with my stairs? Do not leave any items on the stairs. Make sure that there are handrails on both sides of the stairs and use them. Fix handrails that are broken or loose. Make sure that handrails are as long as the stairways. Check any carpeting to make sure that it is firmly attached to the stairs. Fix any carpet that is loose or worn. Avoid having throw rugs at the top or bottom of the stairs. If you do have throw rugs, attach them to the floor with carpet tape. Make sure that you have a light switch at the top of the stairs and the bottom of the stairs. If  you do not have them, ask someone to add them for you. What else can I do to help prevent falls? Wear shoes that: Do not have high heels. Have rubber bottoms. Are comfortable and fit you well. Are closed at the toe. Do not wear sandals. If you use a stepladder: Make sure that it is fully opened. Do not climb a closed stepladder. Make sure that both sides of the stepladder are locked into place. Ask someone to hold it for you, if possible. Clearly mark and make sure that you can see: Any grab bars or handrails. First and last steps. Where the edge of each step is. Use tools that help you move around (mobility aids) if they are needed. These include: Canes. Walkers. Scooters. Crutches. Turn on the lights when you go into a dark area. Replace any light bulbs as soon as they burn out. Set up your furniture so you have a clear path. Avoid moving your furniture around. If any of your floors are uneven, fix them. If there are any pets around you, be aware of where they are. Review your medicines with your doctor. Some medicines can make you feel dizzy. This can increase your chance of falling. Ask your doctor what other things that you can do to help prevent falls. This information is not intended to replace advice given to you by your health care provider. Make sure you discuss any questions you have with your health care provider. Document Released: 03/25/2009 Document Revised: 11/04/2015 Document Reviewed: 07/03/2014 Elsevier Interactive Patient Education  2017 [employer].

## 2022-11-27 NOTE — Progress Notes (Signed)
Subjective:   [name] is a 72 y.o. female who presents for Medicare Annual (Subsequent) preventive examination. I connected with  Terence Quashie Threat on 11/27/22 by a audio enabled telemedicine application and verified that I am speaking with the correct person using two identifiers.  Patient Location: Home  Provider Location: Home Office  I discussed the limitations of evaluation and management by telemedicine. The patient expressed understanding and agreed to proceed.  Review of Systems     Cardiac Risk Factors include: advanced age (>78men, >84 women);diabetes mellitus;dyslipidemia;hypertension     Objective:    Today's Vitals   11/27/22 1518  Weight: 190 lb (86.2 kg)  Height: 5\' 4"  (1.626 m)   Body mass index is 32.61 kg/m.     11/27/2022    3:22 PM 11/24/2021    2:05 PM 11/22/2020    1:44 PM 11/20/2019   10:09 AM 04/29/2019    6:52 AM 04/25/2019    8:48 AM 11/19/2018    2:02 PM  Advanced Directives  Does Patient Have a Medical Advance Directive? No No No No No No No  Would patient like information on creating a medical advance directive? No - Patient declined No - Patient declined No - Patient declined No - Patient declined No - Patient declined Yes (MAU/Ambulatory/Procedural Areas - Information given) No - Patient declined    Current Medications (verified) Outpatient Encounter Medications as of 11/27/2022  Medication Sig   acetaminophen (TYLENOL) 500 MG tablet Take 1 tablet (500 mg total) by mouth every 6 (six) hours as needed.   albuterol (VENTOLIN HFA) 108 (90 Base) MCG/ACT inhaler INHALE 2 PUFFS INTO THE LUNGS EVERY 6 HOURS AS NEEDED FOR WHEEZE OR SHORTNESS OF BREATH   atorvastatin (LIPITOR) 20 MG tablet Take 1 tablet (20 mg total) by mouth daily.   BD INSULIN SYRINGE U/F 31G X 5/16" 0.5 ML MISC USE TO INJECT LANTUS AND HUMALOG DAILY DX E11.65   Budeson-Glycopyrrol-Formoterol (BREZTRI AEROSPHERE) 160-9-4.8 MCG/ACT AERO Inhale 2 puffs into the lungs 2 (two) times  daily.   busPIRone (BUSPAR) 5 MG tablet TAKE 1-2 TABLETS (5-10 MG TOTAL) BY MOUTH 2 (TWO) TIMES DAILY.   calcium carbonate (OS-CAL) 1250 (500 Ca) MG chewable tablet Chew 1 tablet by mouth daily.   carvedilol (COREG) 3.125 MG tablet Take 1 tablet (3.125 mg total) by mouth 2 (two) times daily.   cetirizine (ZYRTEC) 10 MG tablet Take 20 mg by mouth daily.   cloNIDine (CATAPRES) 0.1 MG tablet TAKE 1 TABLET BY MOUTH EVERY DAY   docusate sodium (COLACE) 100 MG capsule Take 100 mg by mouth 2 (two) times daily.   famotidine (PEPCID) 20 MG tablet Take 1 tablet (20 mg total) by mouth 2 (two) times daily. (NEEDS TO BE SEEN BEFORE NEXT REFILL)   fluticasone (FLONASE) 50 MCG/ACT nasal spray Place 1 spray into both nostrils 2 (two) times daily as needed for allergies or rhinitis.   furosemide (LASIX) 40 MG tablet TAKE 1 TABLET BY MOUTH EVERY DAY   gabapentin (NEURONTIN) 800 MG tablet Take 1 tablet (800 mg total) by mouth 4 (four) times daily.   glucose blood (ONETOUCH ULTRA) test strip USE TO TEST BLOOD SUGAR 3 TIMES DAILY AS DIRECTED. DX: E11.65   HUMALOG 100 UNIT/ML injection INJECT 0.04-0.1 MLS (4-10 UNITS TOTAL) INTO THE SKIN 3 (THREE) TIMES DAILY BEFORE MEALS.   hydrOXYzine (ATARAX) 25 MG tablet Take 1 tablet (25 mg total) by mouth 3 (three) times daily. (NEEDS TO BE SEEN BEFORE NEXT REFILL)  Lancets (ONETOUCH ULTRASOFT) lancets Use as instructed   LANTUS 100 UNIT/ML injection INJECT 0.3-0.4 MLS (30-40 UNITS TOTAL) INTO THE SKIN DAILY.   levothyroxine (SYNTHROID) 75 MCG tablet TAKE 1 TABLET BY MOUTH EVERY DAY   lisinopril (ZESTRIL) 40 MG tablet Take 1 tablet (40 mg total) by mouth daily.   metFORMIN (GLUCOPHAGE-XR) 750 MG 24 hr tablet Take 1 tablet (750 mg total) by mouth daily with breakfast. (NEEDS TO BE SEEN BEFORE NEXT REFILL)   montelukast (SINGULAIR) 10 MG tablet TAKE 1 TABLET BY MOUTH EVERY DAY IN THE EVENING   Multiple Vitamins-Minerals (MULTIVITAMIN WITH MINERALS) tablet Take 1 tablet by mouth  daily. Centrum Silver   Propylene Glycol (SYSTANE BALANCE OP) Place 1 drop into both eyes daily as needed (Dry eyes).   No facility-administered encounter medications on file as of 11/27/2022.    Allergies (verified) Other, Aspirin, Jardiance [empagliflozin], Allopurinol, Penicillins, and Sulfa antibiotics   History: Past Medical History:  Diagnosis Date   Allergic rhinitis    Allergy    Anemia    Ankle swelling    Anxiety    takes Xanax daily    Arthritis    Asthma    Blurred vision, bilateral    Chronic kidney disease    Depression    takes Celexa daily   Diabetes mellitus without complication (HCC)    type II; takes Actos daily   GERD (gastroesophageal reflux disease)    Gout    Headache    Heart murmur    Heart palpitations    Hemorrhoids    History of blood transfusion    no abnormal reaction noted   History of bronchitis    Hyperlipidemia    Hypertension    takes Catapres and Quinapril daily   Hypothyroidism    takes Synthroid daily   Moderate mitral insufficiency    a. 12/2014 Echo: EF 55-60%, mod MR, mildly to moderately dil LA, PASP .   Myocardial infarction [facility])    " Minor"   Osteoarthritis    Paroxysmal supraventricular tachycardia    Recurrent ventral incisional hernia    Ringing in ears    Shortness of breath dyspnea    ALbuterol daily as needed   Stevens-Johnson disease (HCC)    Urinary incontinence    Wears glasses    Past Surgical History:  Procedure Laterality Date   ABDOMINAL HYSTERECTOMY     BOWEL RESECTION     COLON SURGERY     COLONOSCOPY     COLONOSCOPY WITH PROPOFOL N/A 04/29/2019   Procedure: COLONOSCOPY WITH PROPOFOL;  Surgeon: West Bali, MD;  Location: [facility];  Service: Endoscopy;  Laterality: N/A;  7:30am   ESOPHAGOGASTRODUODENOSCOPY     HEMORRHOID SURGERY  1998   HERNIA REPAIR     hiatal hernia repair   HIATAL HERNIA REPAIR  1980   pt is unsure of exact date but thinks it was early 46's   INCISIONAL  HERNIA REPAIR N/A 04/02/2018   Procedure: OPEN VENTRAL HERNIA REPAIR WITH MESH, Explantation of hernia mesh;  Surgeon: Manus Rudd, MD;  Location[facility] OR;  Service: General;  Laterality: N/A;   INSERTION OF MESH N/A 04/02/2018   Procedure: INSERTION OF MESH;  Surgeon: Manus Rudd, MD;  Location: [facility] OR;  Service: General;  Laterality: N/A;   POLYPECTOMY  04/29/2019   Procedure: POLYPECTOMY;  Surgeon: West Bali, MD;  Location: [facility];  Service: Endoscopy;;   TOTAL HIP ARTHROPLASTY Left 02/02/2015   Procedure: LEFT TOTAL  HIP ARTHROPLASTY;  Surgeon: Valeria Batman, MD;  Location: [facility] OR;  Service: Orthopedics;  Laterality: Left;   TOTAL HIP ARTHROPLASTY Right 02/01/2016   Procedure: TOTAL HIP ARTHROPLASTY;  Surgeon: Valeria Batman, MD;  Location: [facility] OR;  Service: Orthopedics;  Laterality: Right;   Family History  Problem Relation Age of Onset   Heart attack Mother    Kidney disease Mother    Diabetes Mother    Hypertension Mother    Colon polyps Mother    Cancer Father        Widespread; unknown primary   COPD Father    Hypertension Sister    Hyperlipidemia Sister    Colon cancer Maternal Aunt    Colon cancer Paternal Aunt    Social History   Socioeconomic History   Marital status: Single    Spouse name: Not on file   Number of children: 0   Years of education: 12   Highest education level: High school graduate  Occupational History   Occupation: retired  Tobacco Use   Smoking status: Never   Smokeless tobacco: Never  Building services engineer Use: Never used  Substance and Sexual Activity   Alcohol use: No    Alcohol/week: 0.0 standard drinks of alcohol   Drug use: No   Sexual activity: Not Currently    Birth control/protection: Surgical  Other Topics Concern   Not on file  Social History Narrative   Lives in single level home with her sister.    Social Determinants of Health   Financial Resource Strain: Low Risk  (11/27/2022)   Overall Financial  Resource Strain (CARDIA)    Difficulty of Paying Living Expenses: Not hard at all  Food Insecurity: No Food Insecurity (11/27/2022)   Hunger Vital Sign    Worried About Running Out of Food in the Last Year: Never true    Ran Out of Food in the Last Year: Never true  Transportation Needs: No Transportation Needs (11/27/2022)   PRAPARE - Administrator, Civil Service (Medical): No    Lack of Transportation (Non-Medical): No  Physical Activity: Insufficiently Active (11/27/2022)   Exercise Vital Sign    Days of Exercise per Week: 3 days    Minutes of Exercise per Session: 30 min  Stress: No Stress Concern Present (11/27/2022)   [employer] of Occupational Health - Occupational Stress Questionnaire    Feeling of Stress : Not at all  Social Connections: Socially Isolated (11/27/2022)   Social Connection and Isolation Panel [NHANES]    Frequency of Communication with Friends and Family: More than three times a week    Frequency of Social Gatherings with Friends and Family: More than three times a week    Attends Religious Services: Never    Database administrator or Organizations: No    Attends Engineer, structural: Never    Marital Status: Never married    Tobacco Counseling Counseling given: Not Answered   Clinical Intake:  Pre-visit preparation completed: Yes  Pain : No/denies pain     Nutritional Risks: None Diabetes: Yes CBG done?: No Did pt. bring in CBG monitor from home?: No  How often do you need to have someone help you when you read instructions, pamphlets, or other written materials from your doctor or pharmacy?: 1 - Never  Diabetic?yes  Nutrition Risk Assessment:  Has the patient had any N/V/D within the last 2 months?  No  Does the patient have any non-healing wounds?  No  Has the patient had any unintentional weight loss or weight gain?  No   Diabetes:  Is the patient diabetic?  Yes  If diabetic, was a CBG obtained today?  No   Did the patient bring in their glucometer from home?  No  How often do you monitor your CBG's? 3 x day .   Financial Strains and Diabetes Management:  Are you having any financial strains with the device, your supplies or your medication? No .  Does the patient want to be seen by Chronic Care Management for management of their diabetes?  No  Would the patient like to be referred to a Nutritionist or for Diabetic Management?  No   Diabetic Exams:  Diabetic Eye Exam: Completed 11/2022 Diabetic Foot Exam: Overdue, Pt has been advised about the importance in completing this exam. Pt is scheduled for diabetic foot exam on next office visit .   Interpreter Needed?: No  Information entered by :: Renie Ora, LPN   Activities of Daily Living    11/27/2022    3:23 PM 11/27/2022    3:22 PM  In your present state of health, do you have any difficulty performing the following activities:  Hearing? 0 0  Vision? 0 0  Difficulty concentrating or making decisions? 0 0  Walking or climbing stairs? 0 0  Dressing or bathing? 0 0  Doing errands, shopping? 0 0  Preparing Food and eating ? N N  Using the Toilet? N N  In the past six months, have you accidently leaked urine? N N  Do you have problems with loss of bowel control? N N  Managing your Medications? N N  Managing your Finances? N N  Housekeeping or managing your Housekeeping? N N    Patient Care Team: Junie Spencer, FNP as PCP - General (Family Medicine) Meriam Sprague, MD as PCP - Cardiology (Cardiology) Valeria Batman, MD (Inactive) as Consulting Physician (Orthopedic Surgery) West Bali, MD (Inactive) as Consulting Physician (Gastroenterology) Danella Maiers, [facility] as Pharmacist (Family Medicine) Michaelle Copas, MD as Referring Physician (Optometry)  Indicate any recent Medical Services you may have received from other than [facility] providers in the past year (date may be approximate).     Assessment:   This  is a routine wellness examination for [name].  Hearing/Vision screen Vision Screening - Comments:: Wears rx glasses - up to date with routine eye exams with  Dr.Johnson   Dietary issues and exercise activities discussed: Current Exercise Habits: The patient does not participate in regular exercise at present, Exercise limited by: orthopedic condition(s)   Goals Addressed             This Visit's Progress    DIET - INCREASE WATER INTAKE         Depression Screen    03/17/2022    9:05 AM 12/06/2021    3:34 PM 11/24/2021    2:03 PM 11/17/2021    9:09 AM 09/15/2021    9:25 AM 03/18/2021    8:07 AM 02/17/2021   10:37 AM  PHQ 2/9 Scores  PHQ - 2 Score 0 0 0 0 0 6 0  PHQ- 9 Score 1 3 3 2  15      Fall Risk    11/27/2022    3:19 PM 03/17/2022    9:05 AM 12/06/2021    3:34 PM 11/24/2021    2:01 PM 02/17/2021   10:37 AM  Fall Risk   Falls in  the past year? 0 0 0  0  Number falls in past yr: 0  0    Injury with Fall? 0  0    Risk for fall due to : No Fall Risks  History of fall(s) Orthopedic patient;Impaired balance/gait   Follow up Falls prevention discussed  Falls evaluation completed Falls prevention discussed;Education provided     FALL RISK PREVENTION PERTAINING TO THE HOME:  Any stairs in or around the home? No  If so, are there any without handrails? No  Home free of loose throw rugs in walkways, pet beds, electrical cords, etc? Yes  Adequate lighting in your home to reduce risk of falls? Yes   ASSISTIVE DEVICES UTILIZED TO PREVENT FALLS:  Life alert? No  Use of a cane, walker or w/c? Yes  Grab bars in the bathroom? No  Shower chair or bench in shower? No  Elevated toilet seat or a handicapped toilet? No          11/27/2022    3:23 PM 11/24/2021    2:11 PM 11/22/2020    1:41 PM 11/20/2019   10:17 AM 11/19/2018    2:06 PM  6CIT Screen  What Year? 0 points 0 points 0 points 0 points 0 points  What month? 0 points 0 points 0 points 0 points 0 points  What time? 0 points  0 points 0 points 0 points 0 points  Count back from 20 0 points 0 points 0 points 0 points 0 points  Months in reverse 0 points 0 points 0 points 0 points 0 points  Repeat phrase 0 points 6 points 0 points 2 points 0 points  Total Score 0 points 6 points 0 points 2 points 0 points    Immunizations Immunization History  Administered Date(s) Administered   Covid-19, Mrna,Vaccine(Spikevax)90yrs and older 08/30/2022   Fluad Quad(high Dose 65+) 02/17/2020, 02/09/2022   Influenza, High Dose Seasonal PF 04/05/2018, 02/21/2019, 02/01/2021   Influenza,inj,Quad PF,6+ Mos 04/13/2016   Janssen (J&J) SARS-COV-2 Vaccination 09/18/2019, 04/08/2020   Moderna SARS-COV2 Booster Vaccination 03/10/2021, 11/12/2021   PFIZER(Purple Top)SARS-COV-2 Vaccination 03/06/2022   PNEUMOCOCCAL CONJUGATE-20 03/06/2022   Pneumococcal Conjugate-13 05/01/2018   Pneumococcal Polysaccharide-23 02/04/2016   Rsv, Mab, Judi Cong, 0.5 Ml, Neonate To 24 Mos(Beyfortus) 01/22/2022   Tdap 10/27/2019   Zoster Recombinat (Shingrix) 01/10/2019, 03/19/2019    TDAP status: Up to date  Flu Vaccine status: Up to date  Pneumococcal vaccine status: Up to date  Covid-19 vaccine status: Completed vaccines  Qualifies for Shingles Vaccine? Yes   Zostavax completed Yes   Shingrix Completed?: Yes  Screening Tests Health Maintenance  Topic Date Due   OPHTHALMOLOGY EXAM  10/20/2022   HEMOGLOBIN A1C  01/04/2023   INFLUENZA VACCINE  01/11/2023   Diabetic kidney evaluation - Urine ACR  03/18/2023   Diabetic kidney evaluation - eGFR measurement  07/07/2023   FOOT EXAM  07/07/2023   DEXA SCAN  09/16/2023   Medicare Annual Wellness (AWV)  11/27/2023   Colonoscopy  04/28/2024   DTaP/Tdap/Td (2 - Td or Tdap) 10/26/2029   Pneumonia Vaccine 4+ Years old  Completed   COVID-19 Vaccine  Completed   Hepatitis C Screening  Completed   Zoster Vaccines- Shingrix  Completed   HPV VACCINES  Aged Out   MAMMOGRAM  Discontinued     Health Maintenance  Health Maintenance Due  Topic Date Due   OPHTHALMOLOGY EXAM  10/20/2022    Colorectal cancer screening: Type of screening: Colonoscopy. Completed 04/29/2019. Repeat every 5 years  Mammogram status: Ordered declined . Pt provided with contact info and advised to call to schedule appt.   Bone Density status: Completed 09/15/2021. Results reflect: Bone density results: OSTEOPOROSIS. Repeat every 2 years.  Lung Cancer Screening: (Low Dose CT Chest recommended if Age 66-80 years, 30 pack-year currently smoking OR have quit w/in 15years.) does not qualify.   Lung Cancer Screening Referral: n/a  Additional Screening:  Hepatitis C Screening: does not qualify; Completed 10/27/2019  Vision Screening: Recommended annual ophthalmology exams for early detection of glaucoma and other disorders of the eye. Is the patient up to date with their annual eye exam?  Yes  Who is the provider or what is the name of the office in which the patient attends annual eye exams? Dr.johnson  If pt is not established with a provider, would they like to be referred to a provider to establish care? No .   Dental Screening: Recommended annual dental exams for proper oral hygiene  Community Resource Referral / Chronic Care Management: CRR required this visit?  No   CCM required this visit?  No      Plan:     I have personally reviewed and noted the following in the patient's chart:   Medical and social history Use of alcohol, tobacco or illicit drugs  Current medications and supplements including opioid prescriptions. Patient is not currently taking opioid prescriptions. Functional ability and status Nutritional status Physical activity Advanced directives List of other physicians Hospitalizations, surgeries, and ER visits in previous 12 months Vitals Screenings to include cognitive, depression, and falls Referrals and appointments  In addition, I have reviewed and  discussed with patient certain preventive protocols, quality metrics, and best practice recommendations. A written personalized care plan for preventive services as well as general preventive health recommendations were provided to patient.     Lorrene Reid, LPN   1/61/0960   Nurse Notes: none

## 2022-11-30 LAB — HM DIABETES EYE EXAM

## 2022-12-04 ENCOUNTER — Encounter: Payer: Self-pay | Admitting: Family

## 2022-12-04 ENCOUNTER — Ambulatory Visit (INDEPENDENT_AMBULATORY_CARE_PROVIDER_SITE_OTHER): Payer: 59 | Admitting: Family

## 2022-12-04 VITALS — BP 134/74 | HR 80 | Temp 97.8°F | Ht 64.0 in | Wt 181.8 lb

## 2022-12-04 DIAGNOSIS — Z794 Long term (current) use of insulin: Secondary | ICD-10-CM | POA: Diagnosis not present

## 2022-12-04 DIAGNOSIS — E559 Vitamin D deficiency, unspecified: Secondary | ICD-10-CM | POA: Diagnosis not present

## 2022-12-04 DIAGNOSIS — I340 Nonrheumatic mitral (valve) insufficiency: Secondary | ICD-10-CM

## 2022-12-04 DIAGNOSIS — E669 Obesity, unspecified: Secondary | ICD-10-CM

## 2022-12-04 DIAGNOSIS — E039 Hypothyroidism, unspecified: Secondary | ICD-10-CM

## 2022-12-04 DIAGNOSIS — I129 Hypertensive chronic kidney disease with stage 1 through stage 4 chronic kidney disease, or unspecified chronic kidney disease: Secondary | ICD-10-CM

## 2022-12-04 DIAGNOSIS — E1142 Type 2 diabetes mellitus with diabetic polyneuropathy: Secondary | ICD-10-CM | POA: Diagnosis not present

## 2022-12-04 DIAGNOSIS — E1169 Type 2 diabetes mellitus with other specified complication: Secondary | ICD-10-CM | POA: Diagnosis not present

## 2022-12-04 DIAGNOSIS — D649 Anemia, unspecified: Secondary | ICD-10-CM | POA: Diagnosis not present

## 2022-12-04 DIAGNOSIS — J4520 Mild intermittent asthma, uncomplicated: Secondary | ICD-10-CM | POA: Diagnosis not present

## 2022-12-04 DIAGNOSIS — N3281 Overactive bladder: Secondary | ICD-10-CM | POA: Diagnosis not present

## 2022-12-04 DIAGNOSIS — N1831 Chronic kidney disease, stage 3a: Secondary | ICD-10-CM

## 2022-12-04 DIAGNOSIS — I10 Essential (primary) hypertension: Secondary | ICD-10-CM

## 2022-12-04 LAB — BAYER DCA HB A1C WAIVED: HB A1C (BAYER DCA - WAIVED): 6.3 % — ABNORMAL HIGH (ref 4.8–5.6)

## 2022-12-04 MED ORDER — OXYBUTYNIN CHLORIDE ER 10 MG PO TB24
10.0000 mg | ORAL_TABLET | Freq: Every day | ORAL | 1 refills | Status: DC
Start: 2022-12-04 — End: 2023-01-01

## 2022-12-04 NOTE — Progress Notes (Signed)
Subjective:    Patient ID: [name], female    DOB: Jun 24, 1950, 72 y.o.   MRN: [id number]  Chief Complaint  Patient presents with   Medical Management of Chronic Issues    3 month follow up    Pt calls the office today for chronic follow up. She is followed Cardiologists annually for mitral insufficiency.   She has CKD and avoids NSAID's.  Hypertension This is a chronic problem. The current episode started more than 1 year ago. The problem has been resolved since onset. The problem is controlled. Associated symptoms include anxiety, blurred vision, malaise/fatigue and peripheral edema ("at the end of the day"). Pertinent negatives include no shortness of breath. Risk factors for coronary artery disease include diabetes mellitus and dyslipidemia. The current treatment provides moderate improvement. Identifiable causes of hypertension include a thyroid problem.  Asthma There is no cough, shortness of breath or wheezing. This is a chronic problem. The current episode started more than 1 year ago. The problem occurs intermittently. Associated symptoms include malaise/fatigue. She reports moderate improvement on treatment. Her past medical history is significant for asthma.  Thyroid Problem Presents for follow-up visit. Symptoms include anxiety and fatigue. Patient reports no constipation, depressed mood or dry skin. The symptoms have been stable.  Diabetes She presents for her follow-up diabetic visit. She has type 2 diabetes mellitus. Hypoglycemia symptoms include nervousness/anxiousness. Associated symptoms include blurred vision and fatigue. Pertinent negatives for diabetes include no foot paresthesias. Symptoms are stable. Diabetic complications include peripheral neuropathy. Risk factors for coronary artery disease include diabetes mellitus, dyslipidemia, sedentary lifestyle and hypertension. She is following a generally healthy diet. Her overall blood glucose range is 110-130 mg/dl. Eye  exam is current.  Anxiety Presents for follow-up visit. Symptoms include excessive worry and nervous/anxious behavior. Patient reports no depressed mood or shortness of breath. Symptoms occur rarely. The severity of symptoms is mild.   Her past medical history is significant for anemia and asthma.  Anemia Presents for follow-up visit. Symptoms include malaise/fatigue.  Urinary Frequency  This is a new problem. The current episode started more than 1 year ago. The problem occurs intermittently. The patient is experiencing no pain. Associated symptoms include frequency.      Review of Systems  Constitutional:  Positive for fatigue and malaise/fatigue.  Eyes:  Positive for blurred vision.  Respiratory:  Negative for cough, shortness of breath and wheezing.   Gastrointestinal:  Negative for constipation.  Genitourinary:  Positive for frequency.  Psychiatric/Behavioral:  The patient is nervous/anxious.   All other systems reviewed and are negative.      Objective:   Physical Exam Vitals reviewed.  Constitutional:      General: She is not in acute distress.    Appearance: She is well-developed. She is obese.  HENT:     Head: Normocephalic and atraumatic.     Right Ear: Tympanic membrane normal.     Left Ear: Tympanic membrane normal.  Eyes:     Pupils: Pupils are equal, round, and reactive to light.  Neck:     Thyroid: No thyromegaly.  Cardiovascular:     Rate and Rhythm: Normal rate and regular rhythm.     Heart sounds: Normal heart sounds. No murmur heard. Pulmonary:     Effort: Pulmonary effort is normal. No respiratory distress.     Breath sounds: Normal breath sounds. No wheezing.  Abdominal:     General: Bowel sounds are normal. There is no distension.     Palpations:  Abdomen is soft.     Tenderness: There is no abdominal tenderness.  Musculoskeletal:        General: No tenderness. Normal range of motion.     Cervical back: Normal range of motion and neck supple.   Skin:    General: Skin is warm and dry.  Neurological:     Mental Status: She is alert and oriented to person, place, and time.     Cranial Nerves: No cranial nerve deficit.     Deep Tendon Reflexes: Reflexes are normal and symmetric.  Psychiatric:        Behavior: Behavior normal.        Thought Content: Thought content normal.        Judgment: Judgment normal.       BP (!) 158/79   Pulse 80   Temp 97.8 F (36.6 C) (Temporal)   Ht 5\' 4"  (1.626 m)   Wt 181 lb 12.8 oz (82.5 kg)   SpO2 96%   BMI 31.21 kg/m      Assessment & Plan:  [name] comes in today with chief complaint of Medical Management of Chronic Issues (3 month follow up )   Diagnosis and orders addressed:  1. Anemia, unspecified type - CMP14+EGFR  2. Stage 3a chronic kidney disease (HCC) - CMP14+EGFR  3. Type 2 diabetes mellitus with other specified complication, with long-term current use of insulin (HCC) - Bayer DCA Hb A1c Waived - CMP14+EGFR  4. Diabetic polyneuropathy associated with type 2 diabetes mellitus (HCC)  - CMP14+EGFR  5. Primary hypertension - CMP14+EGFR  6. Hypothyroidism, unspecified type - CMP14+EGFR - TSH  7. Mild intermittent asthma, unspecified whether complicated - CMP14+EGFR  8. Moderate mitral insufficiency - CMP14+EGFR  9. Obesity (BMI 30-39.9) - CMP14+EGFR  10. Vitamin D deficiency - CMP14+EGFR  11. Overactive bladder Start Oxybutynin 10 mg  Limit caffeine  - oxybutynin (DITROPAN XL) 10 MG 24 hr tablet; Take 1 tablet (10 mg total) by mouth at bedtime.  Dispense: 90 tablet; Refill: 1   Labs pending Health Maintenance reviewed Diet and exercise encouraged  Follow up plan: 6 months    Jannifer Rodney, FNP

## 2022-12-04 NOTE — Patient Instructions (Signed)
Health Maintenance After Age 71 After age 71, you are at a higher risk for certain long-term diseases and infections as well as injuries from falls. Falls are a major cause of broken bones and head injuries in people who are older than age 71. Getting regular preventive care can help to keep you healthy and well. Preventive care includes getting regular testing and making lifestyle changes as recommended by your health care provider. Talk with your health care provider about: Which screenings and tests you should have. A screening is a test that checks for a disease when you have no symptoms. A diet and exercise plan that is right for you. What should I know about screenings and tests to prevent falls? Screening and testing are the best ways to find a health problem early. Early diagnosis and treatment give you the best chance of managing medical conditions that are common after age 71. Certain conditions and lifestyle choices may make you more likely to have a fall. Your health care provider may recommend: Regular vision checks. Poor vision and conditions such as cataracts can make you more likely to have a fall. If you wear glasses, make sure to get your prescription updated if your vision changes. Medicine review. Work with your health care provider to regularly review all of the medicines you are taking, including over-the-counter medicines. Ask your health care provider about any side effects that may make you more likely to have a fall. Tell your health care provider if any medicines that you take make you feel dizzy or sleepy. Strength and balance checks. Your health care provider may recommend certain tests to check your strength and balance while standing, walking, or changing positions. Foot health exam. Foot pain and numbness, as well as not wearing proper footwear, can make you more likely to have a fall. Screenings, including: Osteoporosis screening. Osteoporosis is a condition that causes  the bones to get weaker and break more easily. Blood pressure screening. Blood pressure changes and medicines to control blood pressure can make you feel dizzy. Depression screening. You may be more likely to have a fall if you have a fear of falling, feel depressed, or feel unable to do activities that you used to do. Alcohol use screening. Using too much alcohol can affect your balance and may make you more likely to have a fall. Follow these instructions at home: Lifestyle Do not drink alcohol if: Your health care provider tells you not to drink. If you drink alcohol: Limit how much you have to: 0-1 drink a day for women. 0-2 drinks a day for men. Know how much alcohol is in your drink. In the [place] one drink equals one 12 oz bottle of beer (355 mL), one 5 oz glass of wine (148 mL), or one 1 oz glass of hard liquor (44 mL). Do not use any products that contain nicotine or tobacco. These products include cigarettes, chewing tobacco, and vaping devices, such as e-cigarettes. If you need help quitting, ask your health care provider. Activity  Follow a regular exercise program to stay fit. This will help you maintain your balance. Ask your health care provider what types of exercise are appropriate for you. If you need a cane or walker, use it as recommended by your health care provider. Wear supportive shoes that have nonskid soles. Safety  Remove any tripping hazards, such as rugs, cords, and clutter. Install safety equipment such as grab bars in bathrooms and safety rails on stairs. Keep rooms and walkways   well-lit. General instructions Talk with your health care provider about your risks for falling. Tell your health care provider if: You fall. Be sure to tell your health care provider about all falls, even ones that seem minor. You feel dizzy, tiredness (fatigue), or off-balance. Take over-the-counter and prescription medicines only as told by your health care provider. These include  supplements. Eat a healthy diet and maintain a healthy weight. A healthy diet includes low-fat dairy products, low-fat (lean) meats, and fiber from whole grains, beans, and lots of fruits and vegetables. Stay current with your vaccines. Schedule regular health, dental, and eye exams. Summary Having a healthy lifestyle and getting preventive care can help to protect your health and wellness after age 71. Screening and testing are the best way to find a health problem early and help you avoid having a fall. Early diagnosis and treatment give you the best chance for managing medical conditions that are more common for people who are older than age 71. Falls are a major cause of broken bones and head injuries in people who are older than age 71. Take precautions to prevent a fall at home. Work with your health care provider to learn what changes you can make to improve your health and wellness and to prevent falls. This information is not intended to replace advice given to you by your health care provider. Make sure you discuss any questions you have with your health care provider. Document Revised: 10/18/2020 Document Reviewed: 10/18/2020 Elsevier Patient Education  2024 Elsevier Inc.  

## 2022-12-05 ENCOUNTER — Telehealth: Payer: Self-pay | Admitting: Family

## 2022-12-05 LAB — CMP14+EGFR
ALT: 8 IU/L (ref 0–32)
AST: 8 IU/L (ref 0–40)
Albumin: 4.2 g/dL (ref 3.9–4.9)
Alkaline Phosphatase: 178 IU/L — ABNORMAL HIGH (ref 44–121)
BUN/Creatinine Ratio: 17 (ref 12–28)
BUN: 22 mg/dL (ref 8–27)
Bilirubin Total: 0.3 mg/dL (ref 0.0–1.2)
CO2: 21 mmol/L (ref 20–29)
Calcium: 9.8 mg/dL (ref 8.7–10.3)
Chloride: 106 mmol/L (ref 96–106)
Creatinine, Ser: 1.28 mg/dL — ABNORMAL HIGH (ref 0.57–1.00)
Globulin, Total: 3 g/dL (ref 1.5–4.5)
Glucose: 88 mg/dL (ref 70–99)
Potassium: 4.7 mmol/L (ref 3.5–5.2)
Sodium: 143 mmol/L (ref 134–144)
Total Protein: 7.2 g/dL (ref 6.0–8.5)
eGFR: 45 mL/min/{1.73_m2} — ABNORMAL LOW (ref >59)

## 2022-12-05 LAB — TSH: TSH: 0.925 u[IU]/mL (ref 0.450–4.500)

## 2022-12-05 NOTE — Telephone Encounter (Signed)
Pt called requesting to speak with PCP or nurse regarding new medication that was prescribed to her yesterday. Has questions.

## 2022-12-05 NOTE — Telephone Encounter (Signed)
Returned call and answered questions 

## 2022-12-07 ENCOUNTER — Other Ambulatory Visit: Payer: Self-pay | Admitting: Family

## 2022-12-07 DIAGNOSIS — F411 Generalized anxiety disorder: Secondary | ICD-10-CM

## 2022-12-07 DIAGNOSIS — K219 Gastro-esophageal reflux disease without esophagitis: Secondary | ICD-10-CM

## 2022-12-21 ENCOUNTER — Other Ambulatory Visit: Payer: Self-pay | Admitting: Family

## 2022-12-25 ENCOUNTER — Other Ambulatory Visit: Payer: Self-pay | Admitting: Family

## 2023-01-01 ENCOUNTER — Other Ambulatory Visit: Payer: Self-pay | Admitting: Family

## 2023-01-01 ENCOUNTER — Telehealth: Payer: Self-pay | Admitting: Family

## 2023-01-01 DIAGNOSIS — E1165 Type 2 diabetes mellitus with hyperglycemia: Secondary | ICD-10-CM

## 2023-01-01 MED ORDER — OXYBUTYNIN CHLORIDE ER 15 MG PO TB24: 15.0000 mg | ORAL_TABLET | Freq: Every day | ORAL | 1 refills | Status: DC

## 2023-01-01 NOTE — Telephone Encounter (Signed)
Patient aware and verbalized understanding. °

## 2023-01-01 NOTE — Telephone Encounter (Signed)
Oxybutynin increased to 15 mg from 10 mg. Prescription sent to pharmacy.

## 2023-01-04 ENCOUNTER — Other Ambulatory Visit: Payer: Self-pay | Admitting: Family

## 2023-01-11 ENCOUNTER — Other Ambulatory Visit: Payer: Self-pay | Admitting: Family

## 2023-01-12 ENCOUNTER — Telehealth: Payer: Self-pay | Admitting: Family

## 2023-01-12 NOTE — Telephone Encounter (Signed)
Patient aware we did receive

## 2023-01-17 ENCOUNTER — Telehealth: Payer: Self-pay | Admitting: Family

## 2023-01-18 MED ORDER — OXYBUTYNIN CHLORIDE ER 10 MG PO TB24: 10.0000 mg | ORAL_TABLET | Freq: Every day | ORAL | 1 refills | Status: DC

## 2023-01-18 NOTE — Telephone Encounter (Signed)
Oxybutynin decreased to 10 mg Prescription sent to pharmacy

## 2023-01-19 ENCOUNTER — Other Ambulatory Visit: Payer: Self-pay | Admitting: Family

## 2023-01-19 DIAGNOSIS — I10 Essential (primary) hypertension: Secondary | ICD-10-CM

## 2023-01-19 MED ORDER — TOLTERODINE TARTRATE ER 2 MG PO CP24: 2.0000 mg | ORAL_CAPSULE | Freq: Every day | ORAL | 1 refills | Status: DC

## 2023-01-19 NOTE — Telephone Encounter (Signed)
Detrol 2 mg Prescription sent to pharmacy, we can increase this to 4 mg if not strong enough.   Jannifer Rodney, FNP

## 2023-01-19 NOTE — Telephone Encounter (Signed)
Patient aware. She would like to change to a whole different medication that might would help her better. Since the lower dose is not enough and the higher dose is too much. Please advise.

## 2023-01-19 NOTE — Addendum Note (Signed)
Addended by: Jannifer Rodney A on: 01/19/2023 11:46 AM   Modules accepted: Orders

## 2023-01-24 ENCOUNTER — Encounter: Payer: Self-pay | Admitting: Family

## 2023-01-24 NOTE — Telephone Encounter (Signed)
Patient aware and verbalized understanding. °

## 2023-01-25 ENCOUNTER — Other Ambulatory Visit: Payer: Self-pay | Admitting: Family

## 2023-02-16 ENCOUNTER — Telehealth: Payer: Self-pay | Admitting: Family

## 2023-02-16 NOTE — Telephone Encounter (Signed)
Patient aware and verbalized understanding. [name] states it was okay

## 2023-02-17 ENCOUNTER — Other Ambulatory Visit: Payer: Self-pay | Admitting: Family

## 2023-02-17 DIAGNOSIS — I10 Essential (primary) hypertension: Secondary | ICD-10-CM

## 2023-02-19 ENCOUNTER — Other Ambulatory Visit: Payer: Self-pay | Admitting: Family

## 2023-02-19 DIAGNOSIS — Z794 Long term (current) use of insulin: Secondary | ICD-10-CM

## 2023-02-21 ENCOUNTER — Other Ambulatory Visit: Payer: Self-pay | Admitting: Family

## 2023-02-21 DIAGNOSIS — E1065 Type 1 diabetes mellitus with hyperglycemia: Secondary | ICD-10-CM

## 2023-03-19 ENCOUNTER — Telehealth: Payer: Self-pay | Admitting: Family

## 2023-03-19 MED ORDER — TOLTERODINE TARTRATE ER 2 MG PO CP24: 2.0000 mg | ORAL_CAPSULE | Freq: Every day | ORAL | 0 refills | Status: DC

## 2023-03-19 NOTE — Telephone Encounter (Signed)
LMOVM medication sent into [facility]

## 2023-03-19 NOTE — Telephone Encounter (Signed)
[facility]

## 2023-03-19 NOTE — Telephone Encounter (Signed)
  Prescription Request  03/19/2023  Is this a "Controlled Substance" medicine? no  Have you seen your PCP in the last 2 weeks? no  If YES, route message to pool  -  If NO, patient needs to be scheduled for appointment.  What is the name of the medication or equipment? Tolterodine 4 mg  Have you contacted your pharmacy to request a refill? yes   [facility]   Patient notified that their request is being sent to the clinical staff for review and that they should receive a response within 2 business days.

## 2023-03-20 MED ORDER — TOLTERODINE TARTRATE ER 4 MG PO CP24: 4.0000 mg | ORAL_CAPSULE | Freq: Every day | ORAL | 1 refills | Status: DC

## 2023-03-20 NOTE — Telephone Encounter (Signed)
Prescription sent to pharmacy.

## 2023-03-20 NOTE — Telephone Encounter (Signed)
Pt says that her Tolterodine is to be 4 mg  [facility]

## 2023-03-20 NOTE — Addendum Note (Signed)
Addended by: Jannifer Rodney A on: 03/20/2023 03:48 PM   Modules accepted: Orders

## 2023-03-27 ENCOUNTER — Other Ambulatory Visit: Payer: Self-pay

## 2023-03-27 DIAGNOSIS — E1169 Type 2 diabetes mellitus with other specified complication: Secondary | ICD-10-CM

## 2023-03-29 ENCOUNTER — Ambulatory Visit: Payer: Self-pay | Admitting: Internal Medicine

## 2023-03-30 ENCOUNTER — Other Ambulatory Visit: Payer: Self-pay | Admitting: Family

## 2023-04-11 ENCOUNTER — Ambulatory Visit: Payer: Medicare Other | Admitting: Internal Medicine

## 2023-05-14 ENCOUNTER — Telehealth: Payer: Self-pay

## 2023-05-14 NOTE — Patient Outreach (Signed)
Successful call to patient on today regarding preventative mammogram screening. Patient declined at this time and will follow up with PCP at later date.  [name] [place]/VBCI  [facility] Assistant-Population Health [phone]

## 2023-05-21 ENCOUNTER — Other Ambulatory Visit: Payer: Self-pay | Admitting: Family

## 2023-05-21 DIAGNOSIS — E1065 Type 1 diabetes mellitus with hyperglycemia: Secondary | ICD-10-CM

## 2023-05-29 ENCOUNTER — Ambulatory Visit: Payer: 59 | Admitting: Family

## 2023-06-08 ENCOUNTER — Other Ambulatory Visit: Payer: Self-pay | Admitting: Family

## 2023-06-29 ENCOUNTER — Ambulatory Visit: Payer: 59 | Admitting: Family

## 2023-07-03 ENCOUNTER — Other Ambulatory Visit: Payer: Self-pay | Admitting: Family

## 2023-07-05 ENCOUNTER — Other Ambulatory Visit: Payer: Self-pay | Admitting: Family

## 2023-07-08 ENCOUNTER — Other Ambulatory Visit: Payer: Self-pay | Admitting: Family

## 2023-07-08 DIAGNOSIS — F411 Generalized anxiety disorder: Secondary | ICD-10-CM

## 2023-07-09 ENCOUNTER — Telehealth: Payer: Self-pay | Admitting: Pharmacy Technician

## 2023-07-09 ENCOUNTER — Other Ambulatory Visit (HOSPITAL_COMMUNITY): Payer: Self-pay

## 2023-07-09 NOTE — Telephone Encounter (Signed)
Pharmacy Patient Advocate Encounter  Received notification from [facility] that Prior Authorization for hydrOXYzine HCl 25MG  tablets  has been APPROVED from 07/09/2023 to 07/08/2024. Ran test claim, Copay is $4.90. This test claim was processed through [facility]- copay amounts may vary at other pharmacies due to pharmacy/plan contracts, or as the patient moves through the different stages of their insurance plan.   PA #/Case ID/Reference #: [id number]

## 2023-07-09 NOTE — Telephone Encounter (Signed)
Pharmacy Patient Advocate Encounter   Received notification from CoverMyMeds that prior authorization for hydrOXYzine HCl 25MG  tablets is required/requested.   Insurance verification completed.   The patient is insured through [facility] .   Per test claim: PA required; PA submitted to above mentioned insurance via CoverMyMeds Key/confirmation #/EOC [id number] Status is pending

## 2023-07-17 ENCOUNTER — Ambulatory Visit: Payer: Medicare Other | Admitting: Family

## 2023-07-17 ENCOUNTER — Ambulatory Visit (INDEPENDENT_AMBULATORY_CARE_PROVIDER_SITE_OTHER): Payer: Medicare Other

## 2023-07-17 ENCOUNTER — Encounter: Payer: Self-pay | Admitting: Family

## 2023-07-17 VITALS — BP 120/67 | HR 83 | Temp 97.9°F | Ht 64.0 in | Wt 178.6 lb

## 2023-07-17 DIAGNOSIS — N1831 Chronic kidney disease, stage 3a: Secondary | ICD-10-CM

## 2023-07-17 DIAGNOSIS — M25552 Pain in left hip: Secondary | ICD-10-CM | POA: Diagnosis not present

## 2023-07-17 DIAGNOSIS — M25551 Pain in right hip: Secondary | ICD-10-CM

## 2023-07-17 DIAGNOSIS — I10 Essential (primary) hypertension: Secondary | ICD-10-CM | POA: Diagnosis not present

## 2023-07-17 DIAGNOSIS — Z0001 Encounter for general adult medical examination with abnormal findings: Secondary | ICD-10-CM

## 2023-07-17 DIAGNOSIS — E1142 Type 2 diabetes mellitus with diabetic polyneuropathy: Secondary | ICD-10-CM

## 2023-07-17 DIAGNOSIS — K219 Gastro-esophageal reflux disease without esophagitis: Secondary | ICD-10-CM

## 2023-07-17 DIAGNOSIS — F411 Generalized anxiety disorder: Secondary | ICD-10-CM | POA: Diagnosis not present

## 2023-07-17 DIAGNOSIS — Z794 Long term (current) use of insulin: Secondary | ICD-10-CM

## 2023-07-17 DIAGNOSIS — E039 Hypothyroidism, unspecified: Secondary | ICD-10-CM | POA: Diagnosis not present

## 2023-07-17 DIAGNOSIS — Z96643 Presence of artificial hip joint, bilateral: Secondary | ICD-10-CM | POA: Diagnosis not present

## 2023-07-17 DIAGNOSIS — Z0000 Encounter for general adult medical examination without abnormal findings: Secondary | ICD-10-CM | POA: Diagnosis not present

## 2023-07-17 DIAGNOSIS — Z471 Aftercare following joint replacement surgery: Secondary | ICD-10-CM | POA: Diagnosis not present

## 2023-07-17 DIAGNOSIS — E1169 Type 2 diabetes mellitus with other specified complication: Secondary | ICD-10-CM | POA: Diagnosis not present

## 2023-07-17 DIAGNOSIS — E559 Vitamin D deficiency, unspecified: Secondary | ICD-10-CM

## 2023-07-17 DIAGNOSIS — M810 Age-related osteoporosis without current pathological fracture: Secondary | ICD-10-CM

## 2023-07-17 DIAGNOSIS — E669 Obesity, unspecified: Secondary | ICD-10-CM

## 2023-07-17 DIAGNOSIS — M159 Polyosteoarthritis, unspecified: Secondary | ICD-10-CM

## 2023-07-17 DIAGNOSIS — J4520 Mild intermittent asthma, uncomplicated: Secondary | ICD-10-CM

## 2023-07-17 DIAGNOSIS — N3281 Overactive bladder: Secondary | ICD-10-CM

## 2023-07-17 DIAGNOSIS — I340 Nonrheumatic mitral (valve) insufficiency: Secondary | ICD-10-CM

## 2023-07-17 LAB — LIPID PANEL

## 2023-07-17 LAB — BAYER DCA HB A1C WAIVED: HB A1C (BAYER DCA - WAIVED): 6.1 % — ABNORMAL HIGH (ref 4.8–5.6)

## 2023-07-17 MED ORDER — FAMOTIDINE 40 MG PO TABS
40.0000 mg | ORAL_TABLET | Freq: Two times a day (BID) | ORAL | 2 refills | Status: DC
Start: 2023-07-17 — End: 2024-04-23

## 2023-07-17 MED ORDER — CARVEDILOL 3.125 MG PO TABS: 3.1250 mg | ORAL_TABLET | Freq: Two times a day (BID) | ORAL | 3 refills | Status: AC

## 2023-07-17 NOTE — Patient Instructions (Signed)
 GERD in Adults: Diet Changes When you have gastroesophageal reflux disease (GERD), you may need to make changes to your diet. Choosing the right foods can help with your symptoms. Think about working with an expert in healthy eating called a dietitian. They can help you make healthy food choices. What are tips for following this plan? Reading food labels Look for foods that are low in saturated fat. Foods that may help with your symptoms include: Foods with less than 5% of daily value (DV) of fat. Foods with 0 grams of trans fat. Cooking [employer] in ways that don't use a lot of fat. These ways include: Baking. Steaming. Grilling. Broiling. To add flavor, try to use herbs that are low in spice and acidity. Avoid frying your food. Meal planning  Eat small meals often rather than eating 3 large meals each day. Eat your meals slowly in a place where you feel relaxed. If told by your health care provider, avoid: Foods that cause symptoms. Keep a food diary to keep track of foods that cause symptoms. Alcohol. Drinking a lot of liquid with meals. General instructions For 2-3 hours after you eat, avoid: Bending over. Exercise. Lying down. Chew sugar-free gum after meals. What foods should I eat? Eat a healthy diet. Try to include: Foods with high amounts of fiber. These include: Fruits and vegetables. Whole grains and beans. Low-fat dairy products. Lean meats, fish, and poultry. Egg whites. Foods that cause symptoms in someone else may not cause symptoms for you. Work with your provider to find foods that are safe for you. The items listed above may not be all the foods and drinks you can have. Talk with a dietitian to learn more. The items listed above may not be a complete list of foods and beverages you can eat and drink. Contact a dietitian for more information. What foods should I avoid? Limiting some of these foods may help with your symptoms. Each person is different.  Talk with a dietitian or your provider to help you find the exact foods to avoid. Some of the foods to avoid may include: Fruits Fruits with a lot of acid in them. These may include citrus fruits, such as oranges, grapefruit, pineapple, and lemons. Vegetables Deep-fried vegetables, such as [place] fries. Vegetables, sauces, or toppings made with added fat and vegetables with acid in them. These may include tomatoes and tomato products, chili peppers, onions, garlic, and horseradish. Grains Pastries or quick breads with added fat. Meats and other proteins High-fat meats, such as fatty beef or pork, hot dogs, ribs, ham, sausage, salami, and bacon. Fried meat or protein, such as fried fish and fried chicken. Egg yolks. Fats and oils Butter. Margarine. Shortening. Ghee. Drinks Coffee and other drinks with caffeine in them. Fizzy and sugary drinks, such as soda and energy drinks. Fruit juice made with acidic fruits, such as orange or grapefruit. Tomato juice. Sweets and desserts Chocolate and cocoa. Donuts. Seasonings and condiments Mint, such as peppermint and spearmint. Condiments, herbs, or seasonings that cause symptoms. These may include curry, hot sauce, or vinegar-based salad dressings. The items listed above may not be all the foods and drinks you should avoid. Talk with a dietitian to learn more. Questions to ask your health care provider Changes to your diet and everyday life are often the first steps taken to manage symptoms of GERD. If these changes don't help, talk with your provider about taking medicines. Where to find more information International Foundation for Gastrointestinal Disorders:  aboutgerd.org This information is not intended to replace advice given to you by your health care provider. Make sure you discuss any questions you have with your health care provider. Document Revised: 04/10/2023 Document Reviewed: 10/25/2022 Elsevier Patient Education  2024 [employer].

## 2023-07-17 NOTE — Progress Notes (Signed)
 Subjective:    Patient ID: [name], female    DOB: 11-Jun-1951, 73 y.o.   MRN: [id number]  Chief Complaint  Patient presents with   Annual Exam    FOOT EXAM. PT WOULD LIKE TO TALK ABOUT LACTOSE MEDICATION, REQUESTING X-RAY ON HIPS.   Pt calls the office today for CPE and chronic follow up.   She is followed Cardiologists annually for mitral insufficiency.   She has CKD and avoids NSAID's.   She had osteoporosis and her last dexa scan was 09/15/21. She could not tolerate Fosamax .  Hypertension This is a chronic problem. The current episode started more than 1 year ago. The problem has been resolved since onset. The problem is controlled. Associated symptoms include anxiety, blurred vision, malaise/fatigue and peripheral edema (at the end of the day). Pertinent negatives include no shortness of breath. Risk factors for coronary artery disease include diabetes mellitus and dyslipidemia. Past treatments include beta blockers and ACE inhibitors. The current treatment provides moderate improvement. Identifiable causes of hypertension include a thyroid  problem.  Asthma She complains of wheezing. There is no cough or shortness of breath. This is a chronic problem. The current episode started more than 1 year ago. The problem occurs intermittently. Associated symptoms include heartburn and malaise/fatigue. Her symptoms are alleviated by rest. She reports moderate improvement on treatment. Her past medical history is significant for asthma.  Thyroid  Problem Presents for follow-up visit. Symptoms include anxiety, dry skin and fatigue. Patient reports no constipation or depressed mood. The symptoms have been stable.  Diabetes She presents for her follow-up diabetic visit. She has type 2 diabetes mellitus. Hypoglycemia symptoms include nervousness/anxiousness. Associated symptoms include blurred vision and fatigue. Pertinent negatives for diabetes include no foot paresthesias. Symptoms are stable.  Pertinent negatives for diabetic complications include no peripheral neuropathy. Risk factors for coronary artery disease include diabetes mellitus, dyslipidemia, sedentary lifestyle and hypertension. She is following a generally healthy diet. Her overall blood glucose range is 110-130 mg/dl. Eye exam is current.  Anxiety Presents for follow-up visit. Symptoms include excessive worry and nervous/anxious behavior. Patient reports no depressed mood or shortness of breath. Symptoms occur rarely. The severity of symptoms is mild.   Her past medical history is significant for asthma.  Anemia Presents for follow-up visit. Symptoms include malaise/fatigue.  Urinary Frequency  This is a chronic problem. The current episode started more than 1 year ago. The problem occurs intermittently. The patient is experiencing no pain. Associated symptoms include frequency. Treatments tried: detrol . The treatment provided moderate relief.  Gastroesophageal Reflux She complains of belching, heartburn and wheezing. She reports no coughing. This is a chronic problem. The current episode started more than 1 year ago. The problem occurs occasionally. Associated symptoms include fatigue. Risk factors include obesity. She has tried a PPI for the symptoms. The treatment provided moderate relief.  Arthritis Presents for follow-up visit. She complains of pain and stiffness. Affected locations include the right hip and left hip. Her pain is at a severity of 10/10. Associated symptoms include fatigue.      Review of Systems  Constitutional:  Positive for fatigue and malaise/fatigue.  Eyes:  Positive for blurred vision.  Respiratory:  Positive for wheezing. Negative for cough and shortness of breath.   Gastrointestinal:  Positive for heartburn. Negative for constipation.  Genitourinary:  Positive for frequency.  Musculoskeletal:  Positive for arthritis and stiffness.  Psychiatric/Behavioral:  The patient is nervous/anxious.    All other systems reviewed and are negative.  Family History  Problem Relation Age of Onset   Heart attack Mother    Kidney disease Mother    Diabetes Mother    Hypertension Mother    Colon polyps Mother    Cancer Father        Widespread; unknown primary   COPD Father    Hypertension Sister    Hyperlipidemia Sister    Colon cancer Maternal Aunt    Colon cancer Paternal Aunt    Social History   Socioeconomic History   Marital status: Single    Spouse name: Not on file   Number of children: 0   Years of education: 12   Highest education level: High school graduate  Occupational History   Occupation: retired  Tobacco Use   Smoking status: Never   Smokeless tobacco: Never  Vaping Use   Vaping status: Never Used  Substance and Sexual Activity   Alcohol  use: No    Alcohol /week: 0.0 standard drinks of alcohol    Drug use: No   Sexual activity: Not Currently    Birth control/protection: Surgical  Other Topics Concern   Not on file  Social History Narrative   Lives in single level home with her sister.    Social Drivers of Corporate Investment Banker Strain: Low Risk  (11/27/2022)   Overall Financial Resource Strain (CARDIA)    Difficulty of Paying Living Expenses: Not hard at all  Food Insecurity: No Food Insecurity (11/27/2022)   Hunger Vital Sign    Worried About Running Out of Food in the Last Year: Never true    Ran Out of Food in the Last Year: Never true  Transportation Needs: No Transportation Needs (11/27/2022)   PRAPARE - Administrator, Civil Service (Medical): No    Lack of Transportation (Non-Medical): No  Physical Activity: Insufficiently Active (11/27/2022)   Exercise Vital Sign    Days of Exercise per Week: 3 days    Minutes of Exercise per Session: 30 min  Stress: No Stress Concern Present (11/27/2022)   [employer] of Occupational Health - Occupational Stress Questionnaire    Feeling of Stress : Not at all  Social  Connections: Socially Isolated (11/27/2022)   Social Connection and Isolation Panel [NHANES]    Frequency of Communication with Friends and Family: More than three times a week    Frequency of Social Gatherings with Friends and Family: More than three times a week    Attends Religious Services: Never    Database Administrator or Organizations: No    Attends Banker Meetings: Never    Marital Status: Never married    Objective:   Physical Exam Vitals reviewed.  Constitutional:      General: She is not in acute distress.    Appearance: She is well-developed. She is obese.  HENT:     Head: Normocephalic and atraumatic.     Right Ear: Tympanic membrane normal.     Left Ear: Tympanic membrane normal.  Eyes:     Pupils: Pupils are equal, round, and reactive to light.  Neck:     Thyroid : No thyromegaly.  Cardiovascular:     Rate and Rhythm: Normal rate and regular rhythm.     Heart sounds: Normal heart sounds. No murmur heard. Pulmonary:     Effort: Pulmonary effort is normal. No respiratory distress.     Breath sounds: Normal breath sounds. No wheezing.  Abdominal:     General: Bowel sounds are normal. There is  no distension.     Palpations: Abdomen is soft.     Tenderness: There is no abdominal tenderness.  Musculoskeletal:        General: No tenderness. Normal range of motion.     Cervical back: Normal range of motion and neck supple.  Skin:    General: Skin is warm and dry.  Neurological:     Mental Status: She is alert and oriented to person, place, and time.     Cranial Nerves: No cranial nerve deficit.     Deep Tendon Reflexes: Reflexes are normal and symmetric.  Psychiatric:        Behavior: Behavior normal.        Thought Content: Thought content normal.        Judgment: Judgment normal.    Diabetic Foot Exam - Simple   Simple Foot Form Diabetic Foot exam was performed with the following findings: Yes 07/17/2023 12:10 PM  Visual Inspection No  deformities, no ulcerations, no other skin breakdown bilaterally: Yes Sensation Testing Intact to touch and monofilament testing bilaterally: Yes Pulse Check Posterior Tibialis and Dorsalis pulse intact bilaterally: Yes Comments Toenails thick       BP 120/67   Pulse 83   Temp 97.9 F (36.6 C)   Ht 5' 4 (1.626 m)   Wt 178 lb 9.6 oz (81 kg)   SpO2 100%   BMI 30.66 kg/m      Assessment & Plan:  [name] comes in today with chief complaint of Annual Exam (FOOT EXAM. PT WOULD LIKE TO TALK ABOUT LACTOSE MEDICATION, REQUESTING X-RAY ON HIPS.)   Diagnosis and orders addressed:  1. Annual physical exam (Primary) - CMP14+EGFR - CBC with Differential/Platelet - Lipid panel - Microalbumin / creatinine urine ratio - TSH - Bayer DCA Hb A1c Waived  2. Age-related osteoporosis without current pathological fracture - CMP14+EGFR  3. Stage 3a chronic kidney disease (HCC) - CMP14+EGFR  4. Type 2 diabetes mellitus with other specified complication, with long-term current use of insulin  (HCC) - CMP14+EGFR - Microalbumin / creatinine urine ratio - Bayer DCA Hb A1c Waived  5. Diabetic polyneuropathy associated with type 2 diabetes mellitus (HCC) - CMP14+EGFR  6. Generalized anxiety disorder - CMP14+EGFR  7. Generalized OA - CMP14+EGFR  8. Primary hypertension - carvedilol  (COREG ) 3.125 MG tablet; Take 1 tablet (3.125 mg total) by mouth 2 (two) times daily.  Dispense: 180 tablet; Refill: 3 - CMP14+EGFR  9. Hypothyroidism, unspecified type - CMP14+EGFR - TSH  10. Mild intermittent asthma, unspecified whether complicated - CMP14+EGFR  11. Moderate mitral insufficiency - CMP14+EGFR  12. Obesity (BMI 30-39.9) - CMP14+EGFR  13. Overactive bladder - CMP14+EGFR  14. Vitamin D  deficiency - CMP14+EGFR  15. Gastroesophageal reflux disease, unspecified whether esophagitis present -Will increase Pepcid  to 40 mg BID from 20 mg BID -Diet discussed- Avoid fried,  spicy, citrus foods, caffeine and alcohol  -Do not eat 2-3 hours before bedtime -Encouraged small frequent meals -Avoid NSAID's - famotidine  (PEPCID ) 40 MG tablet; Take 1 tablet (40 mg total) by mouth 2 (two) times daily.  Dispense: 180 tablet; Refill: 2 - CMP14+EGFR  16. Bilateral hip pain ROM exercises Tylenol  as needed  - DG HIP UNILAT W OR W/O PELVIS 2-3 VIEWS RIGHT; Future - DG HIP UNILAT W OR W/O PELVIS 2-3 VIEWS LEFT; Future  Continue current medications  Labs pending Health Maintenance reviewed Diet and exercise encouraged  Follow up plan: 4 months    Bari Learn, FNP

## 2023-07-18 LAB — CMP14+EGFR
ALT: 10 IU/L (ref 0–32)
AST: 15 IU/L (ref 0–40)
Albumin: 4.3 g/dL (ref 3.8–4.8)
Alkaline Phosphatase: 181 IU/L — ABNORMAL HIGH (ref 44–121)
BUN/Creatinine Ratio: 15 (ref 12–28)
BUN: 23 mg/dL (ref 8–27)
Bilirubin Total: 0.3 mg/dL (ref 0.0–1.2)
CO2: 22 mmol/L (ref 20–29)
Calcium: 9.8 mg/dL (ref 8.7–10.3)
Chloride: 103 mmol/L (ref 96–106)
Creatinine, Ser: 1.53 mg/dL — ABNORMAL HIGH (ref 0.57–1.00)
Globulin, Total: 3.2 g/dL (ref 1.5–4.5)
Glucose: 177 mg/dL — ABNORMAL HIGH (ref 70–99)
Potassium: 4.4 mmol/L (ref 3.5–5.2)
Sodium: 141 mmol/L (ref 134–144)
Total Protein: 7.5 g/dL (ref 6.0–8.5)
eGFR: 36 mL/min/{1.73_m2} — ABNORMAL LOW (ref >59)

## 2023-07-18 LAB — CBC WITH DIFFERENTIAL/PLATELET
Basophils Absolute: 0 10*3/uL (ref 0.0–0.2)
Basos: 0 %
EOS (ABSOLUTE): 0.2 10*3/uL (ref 0.0–0.4)
Eos: 3 %
Hematocrit: 38.9 % (ref 34.0–46.6)
Hemoglobin: 12 g/dL (ref 11.1–15.9)
Immature Grans (Abs): 0 10*3/uL (ref 0.0–0.1)
Immature Granulocytes: 0 %
Lymphocytes Absolute: 2.1 10*3/uL (ref 0.7–3.1)
Lymphs: 25 %
MCH: 24.9 pg — ABNORMAL LOW (ref 26.6–33.0)
MCHC: 30.8 g/dL — ABNORMAL LOW (ref 31.5–35.7)
MCV: 81 fL (ref 79–97)
Monocytes Absolute: 0.4 10*3/uL (ref 0.1–0.9)
Monocytes: 5 %
Neutrophils Absolute: 5.6 10*3/uL (ref 1.4–7.0)
Neutrophils: 67 %
Platelets: 254 10*3/uL (ref 150–450)
RBC: 4.82 x10E6/uL (ref 3.77–5.28)
RDW: 12.7 % (ref 11.7–15.4)
WBC: 8.4 10*3/uL (ref 3.4–10.8)

## 2023-07-18 LAB — MICROALBUMIN / CREATININE URINE RATIO
Creatinine, Urine: 46.8 mg/dL
Microalb/Creat Ratio: 9 mg/g{creat} (ref 0–29)
Microalbumin, Urine: 4 ug/mL

## 2023-07-18 LAB — TSH: TSH: 0.98 u[IU]/mL (ref 0.450–4.500)

## 2023-07-18 LAB — LIPID PANEL
Cholesterol, Total: 136 mg/dL (ref 100–199)
HDL: 38 mg/dL — ABNORMAL LOW (ref >39)
LDL CALC COMMENT:: 3.6 ratio (ref 0.0–4.4)
LDL Chol Calc (NIH): 77 mg/dL (ref 0–99)
Triglycerides: 118 mg/dL (ref 0–149)
VLDL Cholesterol Cal: 21 mg/dL (ref 5–40)

## 2023-07-20 ENCOUNTER — Telehealth: Payer: Self-pay | Admitting: Family

## 2023-07-20 ENCOUNTER — Telehealth: Payer: Self-pay

## 2023-07-20 NOTE — Telephone Encounter (Signed)
 Pt aware of results. She wants to know when does she need to come back for Creatinine elevated- Needs to repeat, Avoid NSAID's. Per lab results from 07/17/2023. Please add orders and will call back.

## 2023-07-20 NOTE — Telephone Encounter (Signed)
 Copied from CRM [id number]. Topic: Clinical - Lab/Test Results >> Jul 19, 2023  4:00 PM Zayanah H wrote: Reason for CRM: requesting imaging results, advised they are not in yet she will receive a call when ready, she said to lmtcb if dont answer

## 2023-07-22 ENCOUNTER — Other Ambulatory Visit: Payer: Self-pay | Admitting: Family

## 2023-07-22 DIAGNOSIS — K219 Gastro-esophageal reflux disease without esophagitis: Secondary | ICD-10-CM

## 2023-07-23 NOTE — Telephone Encounter (Signed)
 Patient aware and verbalized understanding.

## 2023-07-23 NOTE — Telephone Encounter (Signed)
 She can keep her 4 month follow up and we will check it then.

## 2023-07-31 ENCOUNTER — Telehealth: Payer: Self-pay | Admitting: Family

## 2023-07-31 NOTE — Telephone Encounter (Unsigned)
Copied from CRM [id number]. Topic: Clinical - Medication Question >> Jul 31, 2023  2:16 PM Gildardo Pounds wrote: Reason for CRM: Patient calling again to see if Jannifer Rodney will be [facility] before the [facility] hits. Please contact the patient once the prescription has been sent. Callback number is [phone]

## 2023-08-01 NOTE — Telephone Encounter (Unsigned)
Copied from CRM [id number]. Topic: Clinical - Prescription Issue >> Aug 01, 2023  1:36 PM Antwanette L wrote: Reason for CRM: Patient is calling back in to see if Jannifer Rodney will prescribe some medicine for acute arthitis. Patient is having joint issues and little bit of swelling. Patient is requesting a callback at [phone]. Listed below is the patient preferred pharmacy.  [facility] [address] [place] [address] Phone: [phone] Fax: [phone]

## 2023-08-02 MED ORDER — TRAMADOL HCL 50 MG PO TABS: 50.0000 mg | ORAL_TABLET | Freq: Three times a day (TID) | ORAL | 0 refills | Status: DC | PRN

## 2023-08-02 NOTE — Telephone Encounter (Signed)
She can take tylenol. I can not send in NSAID's because of kidney function. I will send in Ultram 50 mg as needed. This could cause sleepiness, please be careful.

## 2023-08-02 NOTE — Telephone Encounter (Signed)
Pt aware of recommendations and script that was sent to pharmacy

## 2023-08-08 ENCOUNTER — Ambulatory Visit: Payer: Self-pay | Admitting: Family

## 2023-08-08 ENCOUNTER — Other Ambulatory Visit: Payer: Self-pay | Admitting: Family

## 2023-08-08 DIAGNOSIS — E1169 Type 2 diabetes mellitus with other specified complication: Secondary | ICD-10-CM

## 2023-08-08 NOTE — Telephone Encounter (Signed)
 Patient called in with initial concern about if she can take Gabapentin and Tramadol together, stating when she has been taking it together she notices shaking, swaying when walking and disorientation. This RN advised patient that these two medications are contraindicated to take together due to severity of symptoms when mixed together, according to drug journal. Patient states she does not want to stop taking the Tramadol because it has greatly helped with pain control but she needs to know which medication to adjust asap. Please call back patient asap in AM.  Copied from CRM [id number]. Topic: Clinical - Medication Question >> Aug 08, 2023  5:02 PM Ja-Kwan M wrote: Reason for CRM: Patient would like to discuss if there is a drug interaction with taking gabapentin and tramadol. Patient stated that she feels nervous and jittery so she is concerned that her pain medication may be causing her to feel that way. Call back# [phone] Reason for Disposition  [1] Pharmacy calling with prescription question AND [2] triager unable to answer question  Answer Assessment - Initial Assessment Questions 1. NAME of MEDICINE: "What medicine(s) are you calling about?"     Tramadol / Gabapentin 2. QUESTION: "What is your question?" (e.g., double dose of medicine, side effect)     Patient is experiencing disorientation, shaking, jerking, swaying  3. PRESCRIBER: "Who prescribed the medicine?" Reason: if prescribed by specialist, call should be referred to that group.     Jannifer Rodney NP 4. SYMPTOMS: "Do you have any symptoms?" If Yes, ask: "What symptoms are you having?"  "How bad are the symptoms (e.g., mild, moderate, severe)     Please see notes and return call back to patient asap in the AM  Protocols used: Medication Question Call-A-AH

## 2023-08-09 NOTE — Telephone Encounter (Signed)
 Do not take these together. She can continue the Ultram as needed. But do not take the gabapentin when taking the Ultram.

## 2023-08-09 NOTE — Telephone Encounter (Signed)
 Patient aware and verbalized understanding.

## 2023-08-11 ENCOUNTER — Other Ambulatory Visit: Payer: Self-pay | Admitting: Family

## 2023-08-13 ENCOUNTER — Encounter: Payer: Self-pay | Admitting: *Deleted

## 2023-08-14 ENCOUNTER — Other Ambulatory Visit: Payer: Self-pay | Admitting: Family

## 2023-08-14 DIAGNOSIS — I10 Essential (primary) hypertension: Secondary | ICD-10-CM

## 2023-08-14 DIAGNOSIS — E1065 Type 1 diabetes mellitus with hyperglycemia: Secondary | ICD-10-CM

## 2023-08-28 ENCOUNTER — Other Ambulatory Visit: Payer: Self-pay | Admitting: Family

## 2023-08-28 NOTE — Telephone Encounter (Signed)
 Copied from CRM [id number]. Topic: Clinical - Medication Refill >> Aug 28, 2023  1:51 PM Tiffany B wrote: Most Recent Primary Care Visit:   Medication: traMADol (ULTRAM) 50 MG tablet  Has the patient contacted their pharmacy?  Yes  (Agent: If yes, when and what did the pharmacy advise?) Contact PCP because its a controlled substance  Is this the correct pharmacy for this prescription? Yes  This is the patient's preferred pharmacy:  [id number] [place],  - [address] [place] [address] Phone: [phone] Fax: [phone]     Has the prescription been filled recently? Yes   Is the patient out of the medication? Yes  Has the patient been seen for an appointment in the last year OR does the patient have an upcoming appointment? Yes  Can we respond through MyChart? No  Agent: Please be advised that Rx refills may take up to 3 business days. We ask that you follow-up with your pharmacy.

## 2023-08-30 ENCOUNTER — Telehealth: Payer: Self-pay

## 2023-08-30 ENCOUNTER — Telehealth: Payer: Self-pay | Admitting: Family

## 2023-08-30 NOTE — Telephone Encounter (Signed)
 Copied from CRM [id number]. Topic: Clinical - Prescription Issue >> Aug 30, 2023 12:51 PM [name] wrote: Reason for CRM: traMADol (ULTRAM) 50 MG tablet- pharmacy told patient this is not suitable for her arthritis pain, please call to discuss alternative [phone]
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
## 2023-08-30 NOTE — Telephone Encounter (Signed)
 Needs to be seen in person for appointment.
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
## 2023-08-30 NOTE — Telephone Encounter (Signed)
 Patient aware patient only has 2 left requesting you send in refill

## 2023-08-30 NOTE — Telephone Encounter (Signed)
 Ultram is ok to take for pain. Pt can not take NSAID's. She can take tylenol as needed.

## 2023-08-30 NOTE — Telephone Encounter (Signed)
 Tried to call pt to schedule appt but no answer and no vm

## 2023-08-30 NOTE — Telephone Encounter (Signed)
 Refer to other phone note .

## 2023-09-06 ENCOUNTER — Other Ambulatory Visit: Payer: Self-pay | Admitting: Family

## 2023-09-12 ENCOUNTER — Other Ambulatory Visit: Payer: Self-pay | Admitting: Family

## 2023-09-12 DIAGNOSIS — J4520 Mild intermittent asthma, uncomplicated: Secondary | ICD-10-CM

## 2023-09-20 ENCOUNTER — Other Ambulatory Visit: Payer: Self-pay | Admitting: Family

## 2023-10-05 ENCOUNTER — Ambulatory Visit (INDEPENDENT_AMBULATORY_CARE_PROVIDER_SITE_OTHER): Admitting: Family

## 2023-10-05 ENCOUNTER — Encounter (HOSPITAL_BASED_OUTPATIENT_CLINIC_OR_DEPARTMENT_OTHER): Payer: Self-pay | Admitting: Family

## 2023-10-05 ENCOUNTER — Telehealth: Payer: Self-pay | Admitting: Family

## 2023-10-05 VITALS — BP 130/68 | HR 89 | Ht 64.0 in | Wt 173.0 lb

## 2023-10-05 DIAGNOSIS — R55 Syncope and collapse: Secondary | ICD-10-CM | POA: Diagnosis not present

## 2023-10-05 DIAGNOSIS — I10 Essential (primary) hypertension: Secondary | ICD-10-CM | POA: Diagnosis not present

## 2023-10-05 DIAGNOSIS — I340 Nonrheumatic mitral (valve) insufficiency: Secondary | ICD-10-CM

## 2023-10-05 DIAGNOSIS — E782 Mixed hyperlipidemia: Secondary | ICD-10-CM

## 2023-10-05 NOTE — Patient Instructions (Signed)
 Medication Instructions:  Your physician recommends that you continue on your current medications as directed. Please refer to the Current Medication list given to you today.  Testing/Procedures: Your physician has requested that you have an echocardiogram. Echocardiography is a painless test that uses sound waves to create images of your heart. It provides your doctor with information about the size and shape of your heart and how well your heart's chambers and valves are working. This procedure takes approximately one hour. There are no restrictions for this procedure. Please do NOT wear cologne, perfume, aftershave, or lotions (deodorant is allowed). Please arrive 15 minutes prior to your appointment time.  Please note: We ask at that you not bring children with you during ultrasound (echo/ vascular) testing. Due to room size and safety concerns, children are not allowed in the ultrasound rooms during exams. Our front office staff cannot provide observation of children in our lobby area while testing is being conducted. An adult accompanying a patient to their appointment will only be allowed in the ultrasound room at the discretion of the ultrasound technician under special circumstances. We apologize for any inconvenience.  Follow-Up: At [facility], you and your health needs are our priority.  As part of our continuing mission to provide you with exceptional heart care, our providers are all part of one team.  This team includes your primary Cardiologist (physician) and Advanced Practice Providers or APPs (Physician Assistants and Nurse Practitioners) who all work together to provide you with the care you need, when you need it.  Your next appointment:   FOLLOW UP IN 6 MONTHS WITH EDEN MD OR APP
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
## 2023-10-05 NOTE — Telephone Encounter (Signed)
 Pt has to have the forms filled out by 5/5 and I don't see any openings with you and you aren't DOD before then. Will you fill out form without seeing her? Please advise?

## 2023-10-07 ENCOUNTER — Other Ambulatory Visit: Payer: Self-pay | Admitting: Family

## 2023-10-08 ENCOUNTER — Other Ambulatory Visit: Payer: Self-pay | Admitting: Family

## 2023-10-08 NOTE — Telephone Encounter (Signed)
 Appt made

## 2023-10-09 ENCOUNTER — Ambulatory Visit (INDEPENDENT_AMBULATORY_CARE_PROVIDER_SITE_OTHER): Admitting: Family

## 2023-10-09 ENCOUNTER — Ambulatory Visit (INDEPENDENT_AMBULATORY_CARE_PROVIDER_SITE_OTHER)

## 2023-10-09 ENCOUNTER — Other Ambulatory Visit: Payer: Self-pay | Admitting: Family

## 2023-10-09 ENCOUNTER — Encounter: Payer: Self-pay | Admitting: Family

## 2023-10-09 VITALS — BP 129/80 | HR 87 | Temp 97.0°F | Ht 64.0 in | Wt 168.0 lb

## 2023-10-09 DIAGNOSIS — I10 Essential (primary) hypertension: Secondary | ICD-10-CM | POA: Diagnosis not present

## 2023-10-09 DIAGNOSIS — E1169 Type 2 diabetes mellitus with other specified complication: Secondary | ICD-10-CM | POA: Diagnosis not present

## 2023-10-09 DIAGNOSIS — Z780 Asymptomatic menopausal state: Secondary | ICD-10-CM

## 2023-10-09 DIAGNOSIS — J4520 Mild intermittent asthma, uncomplicated: Secondary | ICD-10-CM | POA: Diagnosis not present

## 2023-10-09 DIAGNOSIS — E669 Obesity, unspecified: Secondary | ICD-10-CM

## 2023-10-09 DIAGNOSIS — E1065 Type 1 diabetes mellitus with hyperglycemia: Secondary | ICD-10-CM

## 2023-10-09 DIAGNOSIS — Z794 Long term (current) use of insulin: Secondary | ICD-10-CM
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
## 2023-10-09 NOTE — Patient Instructions (Signed)

## 2023-10-11 DIAGNOSIS — H5203 Hypermetropia, bilateral: Secondary | ICD-10-CM | POA: Diagnosis not present

## 2023-10-11 DIAGNOSIS — Z780 Asymptomatic menopausal state: Secondary | ICD-10-CM | POA: Diagnosis not present

## 2023-10-11 DIAGNOSIS — M810 Age-related osteoporosis without current pathological fracture: Secondary | ICD-10-CM | POA: Diagnosis not present

## 2023-10-11 LAB — HM DIABETES EYE EXAM

## 2023-10-22 ENCOUNTER — Other Ambulatory Visit: Payer: Self-pay | Admitting: Family

## 2023-10-22 MED ORDER — ALENDRONATE SODIUM 70 MG PO TABS: 70.0000 mg | ORAL_TABLET | ORAL | 3 refills | Status: AC

## 2023-10-23 ENCOUNTER — Ambulatory Visit (HOSPITAL_BASED_OUTPATIENT_CLINIC_OR_DEPARTMENT_OTHER): Payer: Self-pay | Admitting: Family

## 2023-10-23 ENCOUNTER — Other Ambulatory Visit

## 2023-10-23 ENCOUNTER — Ambulatory Visit: Attending: Family

## 2023-10-23 DIAGNOSIS — R55 Syncope and collapse: Secondary | ICD-10-CM | POA: Diagnosis not present

## 2023-10-23 LAB — ECHOCARDIOGRAM COMPLETE
AR max vel: 3.09 cm2
AV Area VTI: 2.94 cm2
AV Area mean vel: 3.1 cm2
AV Mean grad: 5 mmHg
AV Peak grad: 8.6 mmHg
Ao pk vel: 1.47 m/s
Area-P 1/2: 3.56 cm2
Calc EF: 56.6 %
MV VTI: 2.83 cm2
S' Lateral: 2.3 cm
Single Plane A2C EF: 27.5 %
Single Plane A4C EF: 73.2 %

## 2023-11-05 ENCOUNTER — Other Ambulatory Visit: Payer: Self-pay | Admitting: Family

## 2023-11-05 DIAGNOSIS — I10 Essential (primary) hypertension: Secondary | ICD-10-CM

## 2023-11-13 ENCOUNTER — Ambulatory Visit: Admitting: Family

## 2023-11-13 ENCOUNTER — Other Ambulatory Visit: Payer: Self-pay | Admitting: Family

## 2023-11-13 DIAGNOSIS — F411 Generalized anxiety disorder: Secondary | ICD-10-CM

## 2023-11-13 DIAGNOSIS — I10 Essential (primary) hypertension: Secondary | ICD-10-CM

## 2023-11-15 ENCOUNTER — Ambulatory Visit: Payer: Medicare Other | Admitting: Family

## 2023-11-16 ENCOUNTER — Encounter: Payer: Self-pay | Admitting: Family

## 2023-11-20 ENCOUNTER — Ambulatory Visit: Admitting: Family

## 2023-11-29 ENCOUNTER — Encounter: Payer: Self-pay | Admitting: Family

## 2023-12-04 DIAGNOSIS — I739 Peripheral vascular disease, unspecified: Secondary | ICD-10-CM | POA: Diagnosis not present

## 2023-12-04 DIAGNOSIS — E1140 Type 2 diabetes mellitus with diabetic neuropathy, unspecified: Secondary | ICD-10-CM | POA: Diagnosis not present

## 2023-12-04 DIAGNOSIS — M79671 Pain in right foot: Secondary | ICD-10-CM | POA: Diagnosis not present

## 2023-12-04 DIAGNOSIS — L609 Nail disorder, unspecified: Secondary | ICD-10-CM | POA: Diagnosis not present

## 2023-12-13 ENCOUNTER — Encounter: Payer: Self-pay | Admitting: Family

## 2023-12-13 ENCOUNTER — Ambulatory Visit (INDEPENDENT_AMBULATORY_CARE_PROVIDER_SITE_OTHER): Admitting: Family

## 2023-12-13 VITALS — BP 134/75 | HR 87 | Temp 98.4°F | Ht 64.0 in | Wt 164.4 lb

## 2023-12-13 DIAGNOSIS — N3281 Overactive bladder: Secondary | ICD-10-CM

## 2023-12-13 DIAGNOSIS — E1169 Type 2 diabetes mellitus with other specified complication: Secondary | ICD-10-CM | POA: Diagnosis not present

## 2023-12-13 DIAGNOSIS — E669 Obesity, unspecified: Secondary | ICD-10-CM | POA: Diagnosis not present

## 2023-12-13 DIAGNOSIS — I10 Essential (primary) hypertension: Secondary | ICD-10-CM | POA: Diagnosis not present

## 2023-12-13 DIAGNOSIS — E1142 Type 2 diabetes mellitus with diabetic polyneuropathy: Secondary | ICD-10-CM

## 2023-12-13 DIAGNOSIS — N1831 Chronic kidney disease, stage 3a: Secondary | ICD-10-CM | POA: Diagnosis not present

## 2023-12-13 DIAGNOSIS — Z794 Long term (current) use of insulin: Secondary | ICD-10-CM | POA: Diagnosis not present

## 2023-12-13 DIAGNOSIS — M159 Polyosteoarthritis, unspecified: Secondary | ICD-10-CM

## 2023-12-13 DIAGNOSIS — J4520 Mild intermittent asthma, uncomplicated: Secondary | ICD-10-CM

## 2023-12-13 DIAGNOSIS — F411 Generalized anxiety disorder: Secondary | ICD-10-CM

## 2023-12-13 DIAGNOSIS — E039 Hypothyroidism, unspecified: Secondary | ICD-10-CM

## 2023-12-13 MED ORDER — MIRABEGRON ER 50 MG PO TB24
50.0000 mg | ORAL_TABLET | Freq: Every day | ORAL | 1 refills | Status: DC
Start: 2023-12-13 — End: 2024-04-23

## 2023-12-13 NOTE — Patient Instructions (Signed)

## 2023-12-13 NOTE — Progress Notes (Signed)
 Subjective:    Patient ID: [name], female    DOB: 12-12-1950, 73 y.o.   MRN: [id number]  Chief Complaint  Patient presents with   Medical Management of Chronic Issues   Pt calls the office today for chronic follow up.   She is followed Cardiologists annually for mitral insufficiency.   She has CKD and avoids NSAID's.   She had osteoporosis and her last dexa scan was 09/15/21. She could not tolerate Fosamax .  Hypertension This is a chronic problem. The current episode started more than 1 year ago. The problem has been resolved since onset. The problem is controlled. Associated symptoms include anxiety, blurred vision, malaise/fatigue, peripheral edema (my ankles) and shortness of breath. Risk factors for coronary artery disease include diabetes mellitus, dyslipidemia and obesity. Past treatments include beta blockers and ACE inhibitors. The current treatment provides moderate improvement. Identifiable causes of hypertension include a thyroid  problem.  Asthma She complains of shortness of breath and wheezing. There is no cough. This is a chronic problem. The current episode started more than 1 year ago. The problem occurs intermittently. Associated symptoms include heartburn and malaise/fatigue. Her symptoms are alleviated by rest. She reports moderate improvement on treatment. Her past medical history is significant for asthma.  Thyroid  Problem Presents for follow-up visit. Symptoms include anxiety, dry skin and fatigue. Patient reports no constipation or depressed mood. The symptoms have been stable.  Diabetes She presents for her follow-up diabetic visit. She has type 2 diabetes mellitus. Hypoglycemia symptoms include nervousness/anxiousness. Associated symptoms include blurred vision and fatigue. Pertinent negatives for diabetes include no foot paresthesias. Symptoms are stable. Pertinent negatives for diabetic complications include no peripheral neuropathy. Risk factors for coronary  artery disease include diabetes mellitus, dyslipidemia, sedentary lifestyle and hypertension. She is following a generally healthy diet. Her overall blood glucose range is 110-130 mg/dl. Eye exam is current.  Anxiety Presents for follow-up visit. Symptoms include excessive worry, nervous/anxious behavior and shortness of breath. Patient reports no depressed mood. Symptoms occur rarely. The severity of symptoms is mild.   Her past medical history is significant for asthma.  Anemia Presents for follow-up visit. Symptoms include malaise/fatigue.  Urinary Frequency  This is a chronic problem. The current episode started more than 1 year ago. The problem occurs intermittently. The patient is experiencing no pain. Associated symptoms include frequency. Treatments tried: detrol . The treatment provided moderate relief.  Gastroesophageal Reflux She complains of belching, heartburn and wheezing. She reports no coughing. This is a chronic problem. The current episode started more than 1 year ago. The problem occurs occasionally. The symptoms are aggravated by certain foods. Associated symptoms include fatigue. Risk factors include obesity. She has tried a PPI for the symptoms. The treatment provided moderate relief.  Arthritis Presents for follow-up visit. She complains of pain and stiffness. Affected locations include the right hip and left hip. Her pain is at a severity of 9/10. Associated symptoms include fatigue.      Review of Systems  Constitutional:  Positive for fatigue and malaise/fatigue.  Eyes:  Positive for blurred vision.  Respiratory:  Positive for shortness of breath and wheezing. Negative for cough.   Gastrointestinal:  Positive for heartburn. Negative for constipation.  Genitourinary:  Positive for frequency.  Musculoskeletal:  Positive for stiffness.  Psychiatric/Behavioral:  The patient is nervous/anxious.   All other systems reviewed and are negative.      Family History   Problem Relation Age of Onset   Heart attack Mother  Kidney disease Mother    Diabetes Mother    Hypertension Mother    Colon polyps Mother    Cancer Father        Widespread; unknown primary   COPD Father    Hypertension Sister    Hyperlipidemia Sister    Colon cancer Maternal Aunt    Colon cancer Paternal Aunt    Social History   Socioeconomic History   Marital status: Single    Spouse name: Not on file   Number of children: 0   Years of education: 12   Highest education level: High school graduate  Occupational History   Occupation: retired  Tobacco Use   Smoking status: Never   Smokeless tobacco: Never  Vaping Use   Vaping status: Never Used  Substance and Sexual Activity   Alcohol  use: No    Alcohol /week: 0.0 standard drinks of alcohol    Drug use: No   Sexual activity: Not Currently    Birth control/protection: Surgical  Other Topics Concern   Not on file  Social History Narrative   Lives in single level home with her sister.    Social Drivers of Corporate investment banker Strain: Low Risk  (11/27/2022)   Overall Financial Resource Strain (CARDIA)    Difficulty of Paying Living Expenses: Not hard at all  Food Insecurity: No Food Insecurity (11/27/2022)   Hunger Vital Sign    Worried About Running Out of Food in the Last Year: Never true    Ran Out of Food in the Last Year: Never true  Transportation Needs: No Transportation Needs (11/27/2022)   PRAPARE - Administrator, Civil Service (Medical): No    Lack of Transportation (Non-Medical): No  Physical Activity: Insufficiently Active (11/27/2022)   Exercise Vital Sign    Days of Exercise per Week: 3 days    Minutes of Exercise per Session: 30 min  Stress: No Stress Concern Present (11/27/2022)   [employer] of Occupational Health - Occupational Stress Questionnaire    Feeling of Stress : Not at all  Social Connections: Socially Isolated (11/27/2022)   Social Connection and Isolation  Panel    Frequency of Communication with Friends and Family: More than three times a week    Frequency of Social Gatherings with Friends and Family: More than three times a week    Attends Religious Services: Never    Database administrator or Organizations: No    Attends Banker Meetings: Never    Marital Status: Never married    Objective:   Physical Exam Vitals reviewed.  Constitutional:      General: She is not in acute distress.    Appearance: She is well-developed. She is obese.  HENT:     Head: Normocephalic and atraumatic.     Right Ear: Tympanic membrane normal.     Left Ear: Tympanic membrane normal.  Eyes:     Pupils: Pupils are equal, round, and reactive to light.  Neck:     Thyroid : No thyromegaly.  Cardiovascular:     Rate and Rhythm: Normal rate and regular rhythm.     Heart sounds: Normal heart sounds. No murmur heard. Pulmonary:     Effort: Pulmonary effort is normal. No respiratory distress.     Breath sounds: Normal breath sounds. No wheezing.  Abdominal:     General: Bowel sounds are normal. There is no distension.     Palpations: Abdomen is soft.     Tenderness: There is  no abdominal tenderness.  Musculoskeletal:        General: No tenderness. Normal range of motion.     Cervical back: Normal range of motion and neck supple.  Skin:    General: Skin is warm and dry.  Neurological:     Mental Status: She is alert and oriented to person, place, and time.     Cranial Nerves: No cranial nerve deficit.     Deep Tendon Reflexes: Reflexes are normal and symmetric.  Psychiatric:        Behavior: Behavior normal.        Thought Content: Thought content normal.        Judgment: Judgment normal.       BP 134/75   Pulse 87   Temp 98.4 F (36.9 C) (Temporal)   Ht 5' 4 (1.626 m)   Wt 164 lb 6.4 oz (74.6 kg)   SpO2 98%   BMI 28.22 kg/m      Assessment & Plan:  [name] comes in today with chief complaint of Medical Management of  Chronic Issues   Diagnosis and orders addressed:  1. Primary hypertension (Primary) - CMP14+EGFR  2. Type 2 diabetes mellitus with other specified complication, with long-term current use of insulin  (HCC) - Bayer DCA Hb A1c Waived - CMP14+EGFR  3. Stage 3a chronic kidney disease (HCC) - CMP14+EGFR  4. Obesity (BMI 30-39.9) - CMP14+EGFR  5. Generalized OA - CMP14+EGFR  6. Mild intermittent asthma, unspecified whether complicated - CMP14+EGFR  7. Generalized anxiety disorder  - CMP14+EGFR  8. Hypothyroidism, unspecified type  - CMP14+EGFR  9. Diabetic polyneuropathy associated with type 2 diabetes mellitus (HCC) - CMP14+EGFR  10. Overactive bladder Will stop detrol  4 mg and start Myrbetriq 50 mg  Limit caffeine  - mirabegron ER (MYRBETRIQ) 50 MG TB24 tablet; Take 1 tablet (50 mg total) by mouth daily.  Dispense: 90 tablet; Refill: 1 - CMP14+EGFR   Continue current medications  Labs pending Health Maintenance reviewed Diet and exercise encouraged  Follow up plan: 4 months    Bari Learn, FNP

## 2023-12-14 LAB — CMP14+EGFR
ALT: 11 IU/L (ref 0–32)
AST: 14 IU/L (ref 0–40)
Albumin: 4.5 g/dL (ref 3.8–4.8)
Alkaline Phosphatase: 165 IU/L — ABNORMAL HIGH (ref 44–121)
BUN/Creatinine Ratio: 13 (ref 12–28)
BUN: 21 mg/dL (ref 8–27)
Bilirubin Total: 0.3 mg/dL (ref 0.0–1.2)
CO2: 22 mmol/L (ref 20–29)
Calcium: 10.1 mg/dL (ref 8.7–10.3)
Chloride: 107 mmol/L — ABNORMAL HIGH (ref 96–106)
Creatinine, Ser: 1.56 mg/dL — ABNORMAL HIGH (ref 0.57–1.00)
Globulin, Total: 2.9 g/dL (ref 1.5–4.5)
Sodium: 146 mmol/L — ABNORMAL HIGH (ref 134–144)
Total Protein: 7.4 g/dL (ref 6.0–8.5)
eGFR: 35 mL/min/1.73 — ABNORMAL LOW (ref >59)

## 2023-12-17 ENCOUNTER — Ambulatory Visit: Payer: Self-pay | Admitting: Family

## 2023-12-17 LAB — BAYER DCA HB A1C WAIVED: HB A1C (BAYER DCA - WAIVED): 6 % — ABNORMAL HIGH (ref 4.8–5.6)

## 2023-12-20 ENCOUNTER — Ambulatory Visit

## 2023-12-24 NOTE — Telephone Encounter (Signed)
 Copied from CRM [id number]. Topic: Clinical - Lab/Test Results >> Dec 24, 2023  2:31 PM Elle L wrote: Reason for CRM: The patient had more questions regarding her A1C and what the exact number was.  Patient notified: 6.0 High  Prediabetic range- discussed diet, exercise

## 2023-12-29 ENCOUNTER — Other Ambulatory Visit: Payer: Self-pay | Admitting: Family

## 2023-12-29 DIAGNOSIS — E1169 Type 2 diabetes mellitus with other specified complication: Secondary | ICD-10-CM

## 2023-12-31 ENCOUNTER — Telehealth: Payer: Self-pay

## 2023-12-31 NOTE — Telephone Encounter (Signed)
 Copied from CRM [id number]. Topic: Clinical - Medication Prior Auth >> Dec 31, 2023  8:52 AM Diannia H wrote: Reason for CRM: Patient called and stated her medicine Gabapentin  has no refills and is needing prior auth to be filled by [facility] in [place]. Could you assist?

## 2024-01-01 ENCOUNTER — Other Ambulatory Visit (HOSPITAL_COMMUNITY): Payer: Self-pay

## 2024-01-01 ENCOUNTER — Telehealth: Payer: Self-pay

## 2024-01-01 NOTE — Telephone Encounter (Signed)
 Per test claim: Refill too soon. PA is not needed at this time. Medication was filled 12/31/23. Next eligible fill date is 01/23/24.

## 2024-01-01 NOTE — Telephone Encounter (Signed)
 Called patient she states she has picked it up.

## 2024-01-01 NOTE — Telephone Encounter (Signed)
 Pharmacy Patient Advocate Encounter   Received notification from Pt Calls Messages that prior authorization for GABAPENTIN  800MG  TABLET is required/requested.   Insurance verification completed.   The patient is insured through [facility] .   Per test claim: Refill too soon. PA is not needed at this time. Medication was filled 12/31/23. Next eligible fill date is 01/23/24.

## 2024-01-14 ENCOUNTER — Other Ambulatory Visit: Payer: Self-pay | Admitting: Family

## 2024-01-26 ENCOUNTER — Other Ambulatory Visit: Payer: Self-pay | Admitting: Family

## 2024-01-26 DIAGNOSIS — I10 Essential (primary) hypertension: Secondary | ICD-10-CM

## 2024-02-09 ENCOUNTER — Other Ambulatory Visit: Payer: Self-pay | Admitting: Family

## 2024-02-09 DIAGNOSIS — I10 Essential (primary) hypertension: Secondary | ICD-10-CM

## 2024-02-09 DIAGNOSIS — F411 Generalized anxiety disorder: Secondary | ICD-10-CM

## 2024-02-09 DIAGNOSIS — E1065 Type 1 diabetes mellitus with hyperglycemia: Secondary | ICD-10-CM

## 2024-02-14 ENCOUNTER — Other Ambulatory Visit: Payer: Self-pay | Admitting: Family

## 2024-03-08 ENCOUNTER — Other Ambulatory Visit: Payer: Self-pay | Admitting: Family

## 2024-03-11 ENCOUNTER — Encounter (INDEPENDENT_AMBULATORY_CARE_PROVIDER_SITE_OTHER): Payer: Self-pay | Admitting: *Deleted

## 2024-03-11 ENCOUNTER — Ambulatory Visit (INDEPENDENT_AMBULATORY_CARE_PROVIDER_SITE_OTHER)

## 2024-03-11 ENCOUNTER — Encounter

## 2024-03-11 VITALS — BP 134/75 | HR 87 | Ht 64.0 in | Wt 164.0 lb

## 2024-03-11 DIAGNOSIS — Z0000 Encounter for general adult medical examination without abnormal findings: Secondary | ICD-10-CM | POA: Diagnosis not present

## 2024-03-11 DIAGNOSIS — Z1211 Encounter for screening for malignant neoplasm of colon: Secondary | ICD-10-CM

## 2024-03-11 NOTE — Patient Instructions (Signed)
 [name],  Thank you for taking the time for your Medicare Wellness Visit. I appreciate your continued commitment to your health goals. Please review the care plan we discussed, and feel free to reach out if I can assist you further.  Medicare recommends these wellness visits once per year to help you and your care team stay ahead of potential health issues. These visits are designed to focus on prevention, allowing your provider to concentrate on managing your acute and chronic conditions during your regular appointments.  Please note that Annual Wellness Visits do not include a physical exam. Some assessments may be limited, especially if the visit was conducted virtually. If needed, we may recommend a separate in-person follow-up with your provider.  Ongoing Care Seeing your primary care provider every 3 to 6 months helps us  monitor your health and provide consistent, personalized care.   Referrals If a referral was made during today's visit and you haven't received any updates within two weeks, please contact the referred provider directly to check on the status.  Recommended Screenings:  Health Maintenance  Topic Date Due   Medicare Annual Wellness Visit  11/27/2023   Flu Shot  01/11/2024   COVID-19 Vaccine (6 - 2025-26 season) 02/11/2024   Colon Cancer Screening  04/28/2024   Breast Cancer Screening  10/07/2024*   Hemoglobin A1C  06/14/2024   Yearly kidney health urinalysis for diabetes  07/16/2024   Complete foot exam   07/16/2024   Eye exam for diabetics  10/10/2024   Yearly kidney function blood test for diabetes  12/12/2024   DEXA scan (bone density measurement)  10/10/2025   DTaP/Tdap/Td vaccine (2 - Td or Tdap) 10/26/2029   Pneumococcal Vaccine for age over 30  Completed   Hepatitis C Screening  Completed   Zoster (Shingles) Vaccine  Completed   HPV Vaccine  Aged Out   Meningitis B Vaccine  Aged Out  *Topic was postponed. The date shown is not the original due date.        03/11/2024    1:38 PM  Advanced Directives  Does Patient Have a Medical Advance Directive? No   Advance Care Planning is important because it: Ensures you receive medical care that aligns with your values, goals, and preferences. Provides guidance to your family and loved ones, reducing the emotional burden of decision-making during critical moments.  Vision: Annual vision screenings are recommended for early detection of glaucoma, cataracts, and diabetic retinopathy. These exams can also reveal signs of chronic conditions such as diabetes and high blood pressure.  Dental: Annual dental screenings help detect early signs of oral cancer, gum disease, and other conditions linked to overall health, including heart disease and diabetes.  Please see the attached documents for additional preventive care recommendations.

## 2024-03-11 NOTE — Progress Notes (Signed)
 Subjective:   [name] is a 73 y.o. who presents for a Medicare Wellness preventive visit.  As a reminder, Annual Wellness Visits don't include a physical exam, and some assessments may be limited, especially if this visit is performed virtually. We may recommend an in-person follow-up visit with your provider if needed.  Visit Complete: Virtual I connected with  [name] on 03/11/24 by a audio enabled telemedicine application and verified that I am speaking with the correct person using two identifiers.  Patient Location: Home  Provider Location: Home Office  I discussed the limitations of evaluation and management by telemedicine. The patient expressed understanding and agreed to proceed.  Vital Signs: Because this visit was a virtual/telehealth visit, some criteria may be missing or patient reported. Any vitals not documented were not able to be obtained and vitals that have been documented are patient reported.  VideoDeclined- This patient declined Librarian, academic. Therefore the visit was completed with audio only.  Persons Participating in Visit: Patient.  AWV Questionnaire: No: Patient Medicare AWV questionnaire was not completed prior to this visit.  Cardiac Risk Factors include: advanced age (>57men, >24 women);hypertension;smoking/ tobacco exposure     Objective:    Today's Vitals   03/11/24 1332  BP: 134/75  Pulse: 87  Weight: 164 lb (74.4 kg)  Height: 5' 4 (1.626 m)   Body mass index is 28.15 kg/m.     03/11/2024    1:38 PM 11/27/2022    3:22 PM 11/24/2021    2:05 PM 11/22/2020    1:44 PM 11/20/2019   10:09 AM 04/29/2019    6:52 AM 04/25/2019    8:48 AM  Advanced Directives  Does Patient Have a Medical Advance Directive? No No No No No No No  Would patient like information on creating a medical advance directive?  No - Patient declined No - Patient declined No - Patient declined No - Patient declined No - Patient  declined Yes (MAU/Ambulatory/Procedural Areas - Information given)    Current Medications (verified) Outpatient Encounter Medications as of 03/11/2024  Medication Sig   acetaminophen  (TYLENOL ) 500 MG tablet Take 1 tablet (500 mg total) by mouth every 6 (six) hours as needed.   albuterol  (VENTOLIN  HFA) 108 (90 Base) MCG/ACT inhaler INHALE 2 PUFFS INTO THE LUNGS EVERY 6 HOURS AS NEEDED FOR WHEEZE OR SHORTNESS OF BREATH   alendronate  (FOSAMAX ) 70 MG tablet Take 1 tablet (70 mg total) by mouth every 7 (seven) days. Take with a full glass of water on an empty stomach.   atorvastatin  (LIPITOR) 20 MG tablet TAKE 1 TABLET BY MOUTH EVERY DAY   BD INSULIN  SYRINGE U/F 31G X 5/16 0.5 ML MISC USE TO INJECT LANTUS  AND HUMALOG  DAILY DX E11.65   budeson-glycopyrrolate -formoterol  (BREZTRI  AEROSPHERE) 160-9-4.8 MCG/ACT AERO INHALE 2 PUFFS INTO THE LUNGS TWICE A DAY   calcium  carbonate (OS-CAL - DOSED IN MG OF ELEMENTAL CALCIUM ) 1250 (500 Ca) MG tablet Take 1 tablet by mouth.   carvedilol  (COREG ) 3.125 MG tablet Take 1 tablet (3.125 mg total) by mouth 2 (two) times daily.   cetirizine (ZYRTEC) 10 MG tablet Take 20 mg by mouth daily.   cloNIDine  (CATAPRES ) 0.1 MG tablet TAKE 1 TABLET BY MOUTH EVERY DAY   docusate sodium  (COLACE) 100 MG capsule Take 100 mg by mouth 2 (two) times daily.   famotidine  (PEPCID ) 40 MG tablet Take 1 tablet (40 mg total) by mouth 2 (two) times daily.   fluticasone  (FLONASE ) 50  MCG/ACT nasal spray Place 1 spray into both nostrils 2 (two) times daily as needed for allergies or rhinitis.   furosemide  (LASIX ) 40 MG tablet TAKE 1 TABLET BY MOUTH EVERY DAY   gabapentin  (NEURONTIN ) 800 MG tablet TAKE 1 TABLET BY MOUTH THREE TIMES A DAY   glucose blood (ONETOUCH ULTRA TEST) test strip USE TO TEST BLOOD SUGAR 3 TIMES DAILY AS DIRECTED. DX: E11.65   HUMALOG  100 UNIT/ML injection INJECT 0.04-0.1 MLS (4-10 UNITS TOTAL) INTO THE SKIN 3 (THREE) TIMES DAILY BEFORE MEALS.   hydrOXYzine  (ATARAX ) 25 MG  tablet TAKE 1 TABLET BY MOUTH THREE TIMES A DAY   Lancets (ONETOUCH ULTRASOFT) lancets USE AS INSTRUCTED   LANTUS  100 UNIT/ML injection INJECT 0.3-0.4 MLS (30-40 UNITS TOTAL) INTO THE SKIN DAILY.   levothyroxine  (SYNTHROID ) 75 MCG tablet Take 1 tablet (75 mcg total) by mouth daily before breakfast.   lisinopril  (ZESTRIL ) 40 MG tablet TAKE 1 TABLET BY MOUTH EVERY DAY   metFORMIN  (GLUCOPHAGE -XR) 750 MG 24 hr tablet TAKE 1 TABLET BY MOUTH EVERY DAY WITH BREAKFAST   mirabegron  ER (MYRBETRIQ ) 50 MG TB24 tablet Take 1 tablet (50 mg total) by mouth daily.   montelukast  (SINGULAIR ) 10 MG tablet TAKE 1 TABLET BY MOUTH EVERY DAY IN THE EVENING   Multiple Vitamins-Minerals (MULTIVITAMIN WITH MINERALS) tablet Take 1 tablet by mouth daily. Centrum Silver   No facility-administered encounter medications on file as of 03/11/2024.    Allergies (verified) Other, Aspirin, Jardiance  [empagliflozin ], Allopurinol, Penicillins, and Sulfa antibiotics   History: Past Medical History:  Diagnosis Date   Allergic rhinitis    Allergy    Anemia    Ankle swelling    Anxiety    takes Xanax  daily    Arthritis    Asthma    Blurred vision, bilateral    Chronic kidney disease    Depression    takes Celexa  daily   Diabetes mellitus without complication (HCC)    type II; takes Actos  daily   GERD (gastroesophageal reflux disease)    Gout    Headache    Heart murmur    Heart palpitations    Hemorrhoids    History of blood transfusion    no abnormal reaction noted   History of bronchitis    Hyperlipidemia    Hypertension    takes Catapres  and Quinapril  daily   Hypothyroidism    takes Synthroid  daily   Moderate mitral insufficiency    a. 12/2014 Echo: EF 55-60%, mod MR, mildly to moderately dil LA, PASP .   Myocardial infarction (HCC)     Minor   Osteoarthritis    Paroxysmal supraventricular tachycardia    Recurrent ventral incisional hernia    Ringing in ears    Shortness of breath dyspnea     ALbuterol  daily as needed   Stevens-Johnson disease    Urinary incontinence    Wears glasses    Past Surgical History:  Procedure Laterality Date   ABDOMINAL HYSTERECTOMY     BOWEL RESECTION     COLON SURGERY     COLONOSCOPY     COLONOSCOPY WITH PROPOFOL  N/A 04/29/2019   Procedure: COLONOSCOPY WITH PROPOFOL ;  Surgeon: Harvey Margo CROME, MD;  [facility];  Service: Endoscopy;  Laterality: N/A;  7:30am   ESOPHAGOGASTRODUODENOSCOPY     HEMORRHOID SURGERY  1998   HERNIA REPAIR     hiatal hernia repair   HIATAL HERNIA REPAIR  1980   pt is unsure of exact date but thinks it  was early 30's   INCISIONAL HERNIA REPAIR N/A 04/02/2018   Procedure: OPEN VENTRAL HERNIA REPAIR WITH MESH, Explantation of hernia mesh;  Surgeon: Belinda Cough, MD;  Location: [facility] OR;  Service: General;  Laterality: N/A;   INSERTION OF MESH N/A 04/02/2018   Procedure: INSERTION OF MESH;  Surgeon: Belinda Cough, MD;  Location: [facility] OR;  Service: General;  Laterality: N/A;   POLYPECTOMY  04/29/2019   Procedure: POLYPECTOMY;  Surgeon: Harvey Margo CROME, MD;  Location: [facility];  Service: Endoscopy;;   TOTAL HIP ARTHROPLASTY Left 02/02/2015   Procedure: LEFT TOTAL HIP ARTHROPLASTY;  Surgeon: Maude LELON Right, MD;  Location: [facility] OR;  Service: Orthopedics;  Laterality: Left;   TOTAL HIP ARTHROPLASTY Right 02/01/2016   Procedure: TOTAL HIP ARTHROPLASTY;  Surgeon: Maude LELON Right, MD;  Location: [facility] OR;  Service: Orthopedics;  Laterality: Right;   Family History  Problem Relation Age of Onset   Heart attack Mother    Kidney disease Mother    Diabetes Mother    Hypertension Mother    Colon polyps Mother    Cancer Father        Widespread; unknown primary   COPD Father    Hypertension Sister    Hyperlipidemia Sister    Colon cancer Maternal Aunt    Colon cancer Paternal Aunt    Social History   Socioeconomic History   Marital status: Single    Spouse name: Not on file   Number of children: 0   Years  of education: 12   Highest education level: High school graduate  Occupational History   Occupation: retired  Tobacco Use   Smoking status: Never   Smokeless tobacco: Never  Vaping Use   Vaping status: Never Used  Substance and Sexual Activity   Alcohol  use: No    Alcohol /week: 0.0 standard drinks of alcohol    Drug use: No   Sexual activity: Not Currently    Birth control/protection: Surgical  Other Topics Concern   Not on file  Social History Narrative   Lives in single level home with her sister.    Social Drivers of Corporate investment banker Strain: Low Risk  (03/11/2024)   Overall Financial Resource Strain (CARDIA)    Difficulty of Paying Living Expenses: Not hard at all  Food Insecurity: No Food Insecurity (03/11/2024)   Hunger Vital Sign    Worried About Running Out of Food in the Last Year: Never true    Ran Out of Food in the Last Year: Never true  Transportation Needs: No Transportation Needs (03/11/2024)   PRAPARE - Administrator, Civil Service (Medical): No    Lack of Transportation (Non-Medical): No  Physical Activity: Inactive (03/11/2024)   Exercise Vital Sign    Days of Exercise per Week: 0 days    Minutes of Exercise per Session: 0 min  Stress: Stress Concern Present (03/11/2024)   [employer] of Occupational Health - Occupational Stress Questionnaire    Feeling of Stress: Rather much  Social Connections: Socially Isolated (03/11/2024)   Social Connection and Isolation Panel    Frequency of Communication with Friends and Family: More than three times a week    Frequency of Social Gatherings with Friends and Family: More than three times a week    Attends Religious Services: Never    Database administrator or Organizations: No    Attends Banker Meetings: Never    Marital Status: Never married    Tobacco  Counseling Counseling given: Yes    Clinical Intake:  Pre-visit preparation completed: Yes  Pain : No/denies  pain     BMI - recorded: 28.15 Nutritional Status: BMI 25 -29 Overweight Nutritional Risks: None Diabetes: Yes  Lab Results  Component Value Date   HGBA1C 6.0 (H) 12/13/2023   HGBA1C 6.1 (H) 07/17/2023   HGBA1C 6.3 (H) 12/04/2022     How often do you need to have someone help you when you read instructions, pamphlets, or other written materials from your doctor or pharmacy?: 1 - Never  Interpreter Needed?: No  Information entered by :: alia t/cma   Activities of Daily Living     03/11/2024    1:36 PM  In your present state of health, do you have any difficulty performing the following activities:  Hearing? 0  Vision? 0  Difficulty concentrating or making decisions? 0  Walking or climbing stairs? 1  Dressing or bathing? 0  Doing errands, shopping? 0  Preparing Food and eating ? N  Using the Toilet? N  In the past six months, have you accidently leaked urine? Y  Do you have problems with loss of bowel control? N  Managing your Medications? N  Managing your Finances? N  Housekeeping or managing your Housekeeping? N    Patient Care Team: Lavell Bari LABOR, FNP as PCP - General (Family Medicine) Hobart Powell BRAVO, MD (Inactive) as PCP - Cardiology (Cardiology) Anderson Maude ORN, MD (Inactive) as Consulting Physician (Orthopedic Surgery) Harvey Margo CROME, MD (Inactive) as Consulting Physician (Gastroenterology) Billee Mliss BIRCH, [facility] as Pharmacist (Family Medicine) Vicci Mcardle, OD (Optometry)  I have updated your Care Teams any recent Medical Services you may have received from other providers in the past year.     Assessment:   This is a routine wellness examination for Mirah.  Hearing/Vision screen Hearing Screening - Comments:: Pt denies hearing dif Vision Screening - Comments:: Pt goes to Dr Vicci, [facility] [address] in [place], [place]/pt wear glasses/last 10/11/2023   Goals Addressed             This Visit's Progress    Patient Stated   On track    Nurse  advised patient to contact PCP office to schedule AVW.       Depression Screen     03/11/2024    1:41 PM 12/13/2023    3:15 PM 12/04/2022   10:39 AM 03/17/2022    9:05 AM 12/06/2021    3:34 PM 11/24/2021    2:03 PM 11/17/2021    9:09 AM  PHQ 2/9 Scores  PHQ - 2 Score 2 1 3  0 0 0 0  PHQ- 9 Score 7 2 5 1 3 3 2     Fall Risk     03/11/2024    1:33 PM 12/13/2023    3:15 PM 12/04/2022   10:39 AM 11/27/2022    3:19 PM 03/17/2022    9:05 AM  Fall Risk   Falls in the past year? 1 1 0 0 0  Number falls in past yr: 1 1  0   Injury with Fall? 0 1  0   Risk for fall due to : Impaired balance/gait;Impaired mobility No Fall Risks  No Fall Risks   Follow up Education provided;Falls evaluation completed Falls evaluation completed;Education provided  Falls prevention discussed     MEDICARE RISK AT HOME:  Medicare Risk at Home Any stairs in or around the home?: Yes If so, are there any without handrails?: Yes Home free  of loose throw rugs in walkways, pet beds, electrical cords, etc?: Yes Adequate lighting in your home to reduce risk of falls?: Yes Life alert?: No Use of a cane, walker or w/c?: Yes Grab bars in the bathroom?: No Shower chair or bench in shower?: No Elevated toilet seat or a handicapped toilet?: Yes  TIMED UP AND GO:  Was the test performed?  no  Cognitive Function: 6CIT completed        03/11/2024    1:39 PM 11/27/2022    3:23 PM 11/24/2021    2:11 PM 11/22/2020    1:41 PM 11/20/2019   10:17 AM  6CIT Screen  What Year? 0 points 0 points 0 points 0 points 0 points  What month? 0 points 0 points 0 points 0 points 0 points  What time? 0 points 0 points 0 points 0 points 0 points  Count back from 20 0 points 0 points 0 points 0 points 0 points  Months in reverse 0 points 0 points 0 points 0 points 0 points  Repeat phrase 0 points 0 points 6 points 0 points 2 points  Total Score 0 points 0 points 6 points 0 points 2 points    Immunizations Immunization History   Administered Date(s) Administered   Fluad Quad(high Dose 65+) 02/17/2020, 02/09/2022   Fluad Trivalent(High Dose 65+) 03/14/2023   INFLUENZA, HIGH DOSE SEASONAL PF 04/05/2018, 02/21/2019, 02/01/2021   Influenza,inj,Quad PF,6+ Mos 04/13/2016   Janssen (J&J) SARS-COV-2 Vaccination 09/18/2019, 04/08/2020   Moderna SARS-COV2 Booster Vaccination 03/10/2021, 11/12/2021   PFIZER(Purple Top)SARS-COV-2 Vaccination 03/06/2022   PNEUMOCOCCAL CONJUGATE-20 03/06/2022   Pneumococcal Conjugate-13 05/01/2018   Pneumococcal Polysaccharide-23 02/04/2016   Rsv, Mab, [place], 0.5 Ml, Neonate To 24 Mos(Beyfortus) 01/22/2022   Tdap 10/27/2019   Zoster Recombinant(Shingrix ) 01/10/2019, 03/19/2019    Screening Tests Health Maintenance  Topic Date Due   Influenza Vaccine  01/11/2024   COVID-19 Vaccine (6 - 2025-26 season) 02/11/2024   Colonoscopy  04/28/2024   Mammogram  10/07/2024 (Originally 11/23/2013)   HEMOGLOBIN A1C  06/14/2024   Diabetic kidney evaluation - Urine ACR  07/16/2024   FOOT EXAM  07/16/2024   OPHTHALMOLOGY EXAM  10/10/2024   Diabetic kidney evaluation - eGFR measurement  12/12/2024   Medicare Annual Wellness (AWV)  03/11/2025   DEXA SCAN  10/10/2025   DTaP/Tdap/Td (2 - Td or Tdap) 10/26/2029   Pneumococcal Vaccine: 50+ Years  Completed   Hepatitis C Screening  Completed   Zoster Vaccines- Shingrix   Completed   HPV VACCINES  Aged Out   Meningococcal B Vaccine  Aged Out    Health Maintenance Items Addressed: See Nurse Notes at the end of this note  Additional Screening:  Vision Screening: Recommended annual ophthalmology exams for early detection of glaucoma and other disorders of the eye. Is the patient up to date with their annual eye exam?  Yes  Who is the provider or what is the name of the office in which the patient attends annual eye exams? [facility] in [facility]  Dental Screening: Recommended annual dental exams for proper oral hygiene  Community Resource  Referral / Chronic Care Management: CRR required this visit?  No   CCM required this visit?  No   Plan:    I have personally reviewed and noted the following in the patient's chart:   Medical and social history Use of alcohol , tobacco or illicit drugs  Current medications and supplements including opioid prescriptions. Patient is not currently taking opioid prescriptions. Functional ability and  status Nutritional status Physical activity Advanced directives List of other physicians Hospitalizations, surgeries, and ER visits in previous 12 months Vitals Screenings to include cognitive, depression, and falls Referrals and appointments  In addition, I have reviewed and discussed with patient certain preventive protocols, quality metrics, and best practice recommendations. A written personalized care plan for preventive services as well as general preventive health recommendations were provided to patient.   Ozie Ned, CMA   03/11/2024   After Visit Summary: (MyChart) Due to this being a telephonic visit, the after visit summary with patients personalized plan was offered to patient via MyChart   Notes: Nothing significant to report at this time.

## 2024-03-20 ENCOUNTER — Other Ambulatory Visit: Payer: Self-pay | Admitting: Family

## 2024-03-20 DIAGNOSIS — E1065 Type 1 diabetes mellitus with hyperglycemia: Secondary | ICD-10-CM

## 2024-03-27 ENCOUNTER — Other Ambulatory Visit: Payer: Self-pay | Admitting: Family

## 2024-03-27 DIAGNOSIS — E1165 Type 2 diabetes mellitus with hyperglycemia: Secondary | ICD-10-CM

## 2024-03-31 DIAGNOSIS — L609 Nail disorder, unspecified: Secondary | ICD-10-CM | POA: Diagnosis not present

## 2024-03-31 DIAGNOSIS — I739 Peripheral vascular disease, unspecified: Secondary | ICD-10-CM | POA: Diagnosis not present

## 2024-03-31 DIAGNOSIS — M79671 Pain in right foot: Secondary | ICD-10-CM | POA: Diagnosis not present

## 2024-03-31 DIAGNOSIS — E1140 Type 2 diabetes mellitus with diabetic neuropathy, unspecified: Secondary | ICD-10-CM | POA: Diagnosis not present

## 2024-04-11 DIAGNOSIS — K080 Exfoliation of teeth due to systemic causes: Secondary | ICD-10-CM | POA: Diagnosis not present

## 2024-04-15 ENCOUNTER — Other Ambulatory Visit: Payer: Self-pay | Admitting: Family

## 2024-04-15 ENCOUNTER — Ambulatory Visit: Payer: Self-pay | Admitting: Family

## 2024-04-24 ENCOUNTER — Ambulatory Visit (INDEPENDENT_AMBULATORY_CARE_PROVIDER_SITE_OTHER): Admitting: Family

## 2024-04-24 ENCOUNTER — Encounter: Payer: Self-pay | Admitting: Family

## 2024-04-24 VITALS — BP 153/77 | HR 80 | Temp 98.0°F | Ht 64.0 in | Wt 170.6 lb

## 2024-04-24 DIAGNOSIS — M159 Polyosteoarthritis, unspecified: Secondary | ICD-10-CM

## 2024-04-24 DIAGNOSIS — J4520 Mild intermittent asthma, uncomplicated: Secondary | ICD-10-CM

## 2024-04-24 DIAGNOSIS — Z794 Long term (current) use of insulin: Secondary | ICD-10-CM

## 2024-04-24 DIAGNOSIS — E039 Hypothyroidism, unspecified: Secondary | ICD-10-CM

## 2024-04-24 DIAGNOSIS — E1065 Type 1 diabetes mellitus with hyperglycemia: Secondary | ICD-10-CM

## 2024-04-24 DIAGNOSIS — I10 Essential (primary) hypertension: Secondary | ICD-10-CM

## 2024-04-24 DIAGNOSIS — K219 Gastro-esophageal reflux disease without esophagitis: Secondary | ICD-10-CM | POA: Diagnosis not present

## 2024-04-24 DIAGNOSIS — E1169 Type 2 diabetes mellitus with other specified complication: Secondary | ICD-10-CM | POA: Diagnosis not present

## 2024-04-24 DIAGNOSIS — F5101 Primary insomnia: Secondary | ICD-10-CM

## 2024-04-24 DIAGNOSIS — N3281 Overactive bladder: Secondary | ICD-10-CM

## 2024-04-24 DIAGNOSIS — F411 Generalized anxiety disorder: Secondary | ICD-10-CM

## 2024-04-24 DIAGNOSIS — E669 Obesity, unspecified: Secondary | ICD-10-CM

## 2024-04-24 MED ORDER — METFORMIN HCL ER 750 MG PO TB24: 750.0000 mg | ORAL_TABLET | Freq: Every day | ORAL | 1 refills | Status: AC

## 2024-04-24 MED ORDER — FUROSEMIDE 40 MG PO TABS: 40.0000 mg | ORAL_TABLET | Freq: Every day | ORAL | 0 refills | Status: DC

## 2024-04-24 MED ORDER — GABAPENTIN 800 MG PO TABS: 800.0000 mg | ORAL_TABLET | Freq: Three times a day (TID) | ORAL | 1 refills | Status: AC

## 2024-04-24 MED ORDER — HYDROXYZINE HCL 25 MG PO TABS: 25.0000 mg | ORAL_TABLET | Freq: Three times a day (TID) | ORAL | 0 refills | Status: DC

## 2024-04-24 MED ORDER — INSULIN GLARGINE 100 UNIT/ML ~~LOC~~ SOLN: SUBCUTANEOUS | 0 refills | Status: DC

## 2024-04-24 MED ORDER — DOXEPIN HCL 10 MG PO CAPS: 10.0000 mg | ORAL_CAPSULE | Freq: Every day | ORAL | 1 refills | Status: AC

## 2024-04-24 MED ORDER — ALBUTEROL SULFATE HFA 108 (90 BASE) MCG/ACT IN AERS: INHALATION_SPRAY | RESPIRATORY_TRACT | 2 refills | Status: AC

## 2024-04-24 MED ORDER — INSULIN LISPRO 100 UNIT/ML IJ SOLN: INTRAMUSCULAR | 1 refills | Status: DC

## 2024-04-24 MED ORDER — BREZTRI AEROSPHERE 160-9-4.8 MCG/ACT IN AERO: 2.0000 | INHALATION_SPRAY | Freq: Two times a day (BID) | RESPIRATORY_TRACT | 2 refills | Status: AC

## 2024-04-24 MED ORDER — CLONIDINE HCL 0.1 MG PO TABS: 0.1000 mg | ORAL_TABLET | Freq: Every day | ORAL | 0 refills | Status: AC

## 2024-04-24 MED ORDER — FAMOTIDINE 40 MG PO TABS: 40.0000 mg | ORAL_TABLET | Freq: Two times a day (BID) | ORAL | 2 refills | Status: AC

## 2024-04-24 MED ORDER — DEXCOM G7 RECEIVER DEVI: 1.0000 | Freq: Every day | 3 refills | Status: AC

## 2024-04-24 MED ORDER — DEXCOM G7 SENSOR MISC: 1.0000 | Freq: Every day | 3 refills | Status: AC

## 2024-04-24 MED ORDER — LISINOPRIL 40 MG PO TABS: 40.0000 mg | ORAL_TABLET | Freq: Every day | ORAL | 0 refills | Status: DC

## 2024-04-24 MED ORDER — MIRABEGRON ER 50 MG PO TB24: 50.0000 mg | ORAL_TABLET | Freq: Every day | ORAL | 1 refills | Status: AC

## 2024-04-24 NOTE — Patient Instructions (Signed)
 Insomnia Insomnia is a sleep disorder that makes it difficult to fall asleep or stay asleep. Insomnia can cause fatigue, low energy, difficulty concentrating, mood swings, and poor performance at work or school. There are three different ways to classify insomnia: Difficulty falling asleep. Difficulty staying asleep. Waking up too early in the morning. Any type of insomnia can be long-term (chronic) or short-term (acute). Both are common. Short-term insomnia usually lasts for 3 months or less. Chronic insomnia occurs at least three times a week for longer than 3 months. What are the causes? Insomnia may be caused by another condition, situation, or substance, such as: Having certain mental health conditions, such as anxiety and depression. Using caffeine, alcohol , tobacco, or drugs. Having gastrointestinal conditions, such as gastroesophageal reflux disease (GERD). Having certain medical conditions. These include: Asthma. Alzheimer's disease. Stroke. Chronic pain. An overactive thyroid  gland (hyperthyroidism). Other sleep disorders, such as restless legs syndrome and sleep apnea. Menopause. Sometimes, the cause of insomnia may not be known. What increases the risk? Risk factors for insomnia include: Gender. Females are affected more often than males. Age. Insomnia is more common as people get older. Stress and certain medical and mental health conditions. Lack of exercise. Having an irregular work schedule. This may include working night shifts and traveling between different time zones. What are the signs or symptoms? If you have insomnia, the main symptom is having trouble falling asleep or having trouble staying asleep. This may lead to other symptoms, such as: Feeling tired or having low energy. Feeling nervous about going to sleep. Not feeling rested in the morning. Having trouble concentrating. Feeling irritable, anxious, or depressed. How is this diagnosed? This condition  may be diagnosed based on: Your symptoms and medical history. Your health care provider may ask about: Your sleep habits. Any medical conditions you have. Your mental health. A physical exam. How is this treated? Treatment for insomnia depends on the cause. Treatment may focus on treating an underlying condition that is causing the insomnia. Treatment may also include: Medicines to help you sleep. Counseling or therapy. Lifestyle adjustments to help you sleep better. Follow these instructions at home: Eating and drinking  Limit or avoid alcohol , caffeinated beverages, and products that contain nicotine and tobacco, especially close to bedtime. These can disrupt your sleep. Do not eat a large meal or eat spicy foods right before bedtime. This can lead to digestive discomfort that can make it hard for you to sleep. Sleep habits  Keep a sleep diary to help you and your health care provider figure out what could be causing your insomnia. Write down: When you sleep. When you wake up during the night. How well you sleep and how rested you feel the next day. Any side effects of medicines you are taking. What you eat and drink. Make your bedroom a dark, comfortable place where it is easy to fall asleep. Put up shades or blackout curtains to block light from outside. Use a white noise machine to block noise. Keep the temperature cool. Limit screen use before bedtime. This includes: Not watching TV. Not using your smartphone, tablet, or computer. Stick to a routine that includes going to bed and waking up at the same times every day and night. This can help you fall asleep faster. Consider making a quiet activity, such as reading, part of your nighttime routine. Try to avoid taking naps during the day so that you sleep better at night. Get out of bed if you are still awake after  15 minutes of trying to sleep. Keep the lights down, but try reading or doing a quiet activity. When you feel  sleepy, go back to bed. General instructions Take over-the-counter and prescription medicines only as told by your health care provider. Exercise regularly as told by your health care provider. However, avoid exercising in the hours right before bedtime. Use relaxation techniques to manage stress. Ask your health care provider to suggest some techniques that may work well for you. These may include: Breathing exercises. Routines to release muscle tension. Visualizing peaceful scenes. Make sure that you drive carefully. Do not drive if you feel very sleepy. Keep all follow-up visits. This is important. Contact a health care provider if: You are tired throughout the day. You have trouble in your daily routine due to sleepiness. You continue to have sleep problems, or your sleep problems get worse. Get help right away if: You have thoughts about hurting yourself or someone else. Get help right away if you feel like you may hurt yourself or others, or have thoughts about taking your own life. Go to your nearest emergency room or: Call 911. Call the National Suicide Prevention Lifeline at 2232757840 or 988. This is open 24 hours a day. Text the Crisis Text Line at 657-529-4371. Summary Insomnia is a sleep disorder that makes it difficult to fall asleep or stay asleep. Insomnia can be long-term (chronic) or short-term (acute). Treatment for insomnia depends on the cause. Treatment may focus on treating an underlying condition that is causing the insomnia. Keep a sleep diary to help you and your health care provider figure out what could be causing your insomnia. This information is not intended to replace advice given to you by your health care provider. Make sure you discuss any questions you have with your health care provider. Document Revised: 05/09/2021 Document Reviewed: 05/09/2021 Elsevier Patient Education  2024 [employer].

## 2024-04-24 NOTE — Progress Notes (Signed)
 Subjective:    Patient ID: [name], female    DOB: 01-03-1951, 73 y.o.   MRN: [id number]  Chief Complaint  Patient presents with   Medical Management of Chronic Issues    Sleep walking med is not working good, discuss dexacom and change bladder meds to something stronger   Pt calls the office today for chronic follow up.   She is followed Cardiologists annually for mitral insufficiency.   She has CKD and avoids NSAID's.   She had osteoporosis and her last dexa scan was 09/15/21. She could not tolerate Fosamax .  Hypertension This is a chronic problem. The current episode started more than 1 year ago. The problem has been waxing and waning since onset. The problem is uncontrolled. Associated symptoms include anxiety, blurred vision, malaise/fatigue, peripheral edema (my ankles) and shortness of breath. Risk factors for coronary artery disease include diabetes mellitus, dyslipidemia, obesity and sedentary lifestyle. Past treatments include beta blockers and ACE inhibitors. The current treatment provides moderate improvement. Identifiable causes of hypertension include a thyroid  problem.  Asthma She complains of shortness of breath and wheezing. There is no cough. This is a chronic problem. The current episode started more than 1 year ago. The problem occurs intermittently. Associated symptoms include heartburn and malaise/fatigue. Her symptoms are alleviated by rest. She reports moderate improvement on treatment. Her past medical history is significant for asthma.  Thyroid  Problem Presents for follow-up visit. Symptoms include anxiety, dry skin and fatigue. Patient reports no constipation, depressed mood or diarrhea. The symptoms have been stable.  Diabetes She presents for her follow-up diabetic visit. She has type 2 diabetes mellitus. Hypoglycemia symptoms include nervousness/anxiousness. Associated symptoms include blurred vision and fatigue. Pertinent negatives for diabetes include no  foot paresthesias. Symptoms are stable. Pertinent negatives for diabetic complications include no peripheral neuropathy. Risk factors for coronary artery disease include diabetes mellitus, dyslipidemia, sedentary lifestyle, hypertension and obesity. She is following a generally healthy diet. Her overall blood glucose range is 110-130 mg/dl. Eye exam is current.  Anxiety Presents for follow-up visit. Symptoms include excessive worry, insomnia, nervous/anxious behavior and shortness of breath. Patient reports no depressed mood. Symptoms occur occasionally. The severity of symptoms is mild.   Her past medical history is significant for asthma.  Anemia Presents for follow-up visit. Symptoms include malaise/fatigue.  Urinary Frequency  This is a chronic problem. The current episode started more than 1 year ago. The problem occurs intermittently. The patient is experiencing no pain. Associated symptoms include frequency. Treatments tried: detrol . The treatment provided mild relief.  Gastroesophageal Reflux She complains of belching, heartburn and wheezing. She reports no coughing. This is a chronic problem. The current episode started more than 1 year ago. The problem occurs occasionally. The symptoms are aggravated by certain foods. Associated symptoms include fatigue. Risk factors include obesity. She has tried a PPI for the symptoms. The treatment provided moderate relief.  Arthritis Presents for follow-up visit. She complains of pain and stiffness. Affected locations include the right hip and left hip. Her pain is at a severity of 9/10. Associated symptoms include fatigue. Pertinent negatives include no diarrhea.  Insomnia Primary symptoms: sleep disturbance, difficulty falling asleep, frequent awakening, malaise/fatigue.   The current episode started more than one year. The problem occurs intermittently.      Review of Systems  Constitutional:  Positive for fatigue and malaise/fatigue.  Eyes:   Positive for blurred vision.  Respiratory:  Positive for shortness of breath and wheezing. Negative for cough.  Gastrointestinal:  Positive for heartburn. Negative for constipation and diarrhea.  Genitourinary:  Positive for frequency.  Musculoskeletal:  Positive for stiffness.  Psychiatric/Behavioral:  Positive for sleep disturbance. The patient is nervous/anxious and has insomnia.   All other systems reviewed and are negative.      Family History  Problem Relation Age of Onset   Heart attack Mother    Kidney disease Mother    Diabetes Mother    Hypertension Mother    Colon polyps Mother    Cancer Father        Widespread; unknown primary   COPD Father    Hypertension Sister    Hyperlipidemia Sister    Colon cancer Maternal Aunt    Colon cancer Paternal Aunt    Social History   Socioeconomic History   Marital status: Single    Spouse name: Not on file   Number of children: 0   Years of education: 12   Highest education level: High school graduate  Occupational History   Occupation: retired  Tobacco Use   Smoking status: Never   Smokeless tobacco: Never  Vaping Use   Vaping status: Never Used  Substance and Sexual Activity   Alcohol  use: No    Alcohol /week: 0.0 standard drinks of alcohol    Drug use: No   Sexual activity: Not Currently    Birth control/protection: Surgical  Other Topics Concern   Not on file  Social History Narrative   Lives in single level home with her sister.    Social Drivers of Corporate Investment Banker Strain: Low Risk  (03/11/2024)   Overall Financial Resource Strain (CARDIA)    Difficulty of Paying Living Expenses: Not hard at all  Food Insecurity: No Food Insecurity (03/11/2024)   Hunger Vital Sign    Worried About Running Out of Food in the Last Year: Never true    Ran Out of Food in the Last Year: Never true  Transportation Needs: No Transportation Needs (03/11/2024)   PRAPARE - Administrator, Civil Service  (Medical): No    Lack of Transportation (Non-Medical): No  Physical Activity: Inactive (03/11/2024)   Exercise Vital Sign    Days of Exercise per Week: 0 days    Minutes of Exercise per Session: 0 min  Stress: Stress Concern Present (03/11/2024)   [employer] of Occupational Health - Occupational Stress Questionnaire    Feeling of Stress: Rather much  Social Connections: Socially Isolated (03/11/2024)   Social Connection and Isolation Panel    Frequency of Communication with Friends and Family: More than three times a week    Frequency of Social Gatherings with Friends and Family: More than three times a week    Attends Religious Services: Never    Database Administrator or Organizations: No    Attends Banker Meetings: Never    Marital Status: Never married    Objective:   Physical Exam Vitals reviewed.  Constitutional:      General: She is not in acute distress.    Appearance: She is well-developed. She is obese.  HENT:     Head: Normocephalic and atraumatic.     Right Ear: Tympanic membrane normal.     Left Ear: Tympanic membrane normal.  Eyes:     Pupils: Pupils are equal, round, and reactive to light.  Neck:     Thyroid : No thyromegaly.  Cardiovascular:     Rate and Rhythm: Normal rate and regular rhythm.  Heart sounds: Normal heart sounds. No murmur heard. Pulmonary:     Effort: Pulmonary effort is normal. No respiratory distress.     Breath sounds: Normal breath sounds. No wheezing.  Abdominal:     General: Bowel sounds are normal. There is no distension.     Palpations: Abdomen is soft.     Tenderness: There is no abdominal tenderness.  Musculoskeletal:        General: No tenderness. Normal range of motion.     Cervical back: Normal range of motion and neck supple.  Skin:    General: Skin is warm and dry.  Neurological:     Mental Status: She is alert and oriented to person, place, and time.     Cranial Nerves: No cranial nerve deficit.      Deep Tendon Reflexes: Reflexes are normal and symmetric.  Psychiatric:        Behavior: Behavior normal.        Thought Content: Thought content normal.        Judgment: Judgment normal.       BP (!) 153/77   Pulse 80   Temp 98 F (36.7 C) (Temporal)   Ht 5' 4 (1.626 m)   Wt 170 lb 9.6 oz (77.4 kg)   BMI 29.28 kg/m      Assessment & Plan:  [name] comes in today with chief complaint of Medical Management of Chronic Issues (Sleep walking med is not working good, discuss dexacom and change bladder meds to something stronger)   Diagnosis and orders addressed:  1. Mild intermittent asthma, unspecified whether complicated - albuterol  (VENTOLIN  HFA) 108 (90 Base) MCG/ACT inhaler; INHALE 2 PUFFS INTO THE LUNGS EVERY 6 HOURS AS NEEDED FOR WHEEZE OR SHORTNESS OF BREATH  Dispense: 8.5 each; Refill: 2 - budesonide -glycopyrrolate -formoterol  (BREZTRI  AEROSPHERE) 160-9-4.8 MCG/ACT AERO inhaler; Inhale 2 puffs into the lungs 2 (two) times daily.  Dispense: 10.7 each; Refill: 2 - CBC with Differential/Platelet - CMP14+EGFR  2. Essential hypertension - cloNIDine  (CATAPRES ) 0.1 MG tablet; Take 1 tablet (0.1 mg total) by mouth daily.  Dispense: 90 tablet; Refill: 0 - lisinopril  (ZESTRIL ) 40 MG tablet; Take 1 tablet (40 mg total) by mouth daily.  Dispense: 90 tablet; Refill: 0 - CBC with Differential/Platelet - CMP14+EGFR  3. Gastroesophageal reflux disease, unspecified whether esophagitis present - famotidine  (PEPCID ) 40 MG tablet; Take 1 tablet (40 mg total) by mouth 2 (two) times daily.  Dispense: 180 tablet; Refill: 2 - CBC with Differential/Platelet - CMP14+EGFR  4. Essential (primary) hypertension - furosemide  (LASIX ) 40 MG tablet; Take 1 tablet (40 mg total) by mouth daily.  Dispense: 90 tablet; Refill: 0 - CBC with Differential/Platelet - CMP14+EGFR  5. Type 2 diabetes mellitus with other specified complication, with long-term current use of insulin  (HCC)  (Primary) -Discussed holding Humalog . Pt still wants rx just in case her glucose gets elevated.  Low carb diet - gabapentin  (NEURONTIN ) 800 MG tablet; Take 1 tablet (800 mg total) by mouth 3 (three) times daily.  Dispense: 90 tablet; Refill: 1 - insulin  lispro (HUMALOG ) 100 UNIT/ML injection; INJECT 0.04-0.1 MLS (4-10 UNITS TOTAL) INTO THE SKIN 3 (THREE) TIMES DAILY BEFORE MEALS.  Dispense: 10 mL; Refill: 1 - insulin  glargine (LANTUS ) 100 UNIT/ML injection; INJECT 0.3-0.4 MLS (30-40 UNITS TOTAL) INTO THE SKIN DAILY.  Dispense: 40 mL; Refill: 0 - metFORMIN  (GLUCOPHAGE -XR) 750 MG 24 hr tablet; Take 1 tablet (750 mg total) by mouth daily with breakfast.  Dispense: 90 tablet; Refill: 1 - Bayer  DCA Hb A1c Waived - CBC with Differential/Platelet - CMP14+EGFR - Vitamin B12  6. Generalized anxiety disorder - hydrOXYzine  (ATARAX ) 25 MG tablet; Take 1 tablet (25 mg total) by mouth 3 (three) times daily.  Dispense: 270 tablet; Refill: 0 - CBC with Differential/Platelet - CMP14+EGFR  7. Overactive bladder - mirabegron  ER (MYRBETRIQ ) 50 MG TB24 tablet; Take 1 tablet (50 mg total) by mouth daily.  Dispense: 90 tablet; Refill: 1 - CBC with Differential/Platelet - CMP14+EGFR  8. Obesity (BMI 30-39.9) - CBC with Differential/Platelet - CMP14+EGFR  9. Hypothyroidism, unspecified type - CBC with Differential/Platelet - CMP14+EGFR - TSH  10. Primary hypertension - CBC with Differential/Platelet - CMP14+EGFR  11. Generalized OA - CBC with Differential/Platelet - CMP14+EGFR  12. Primary insomnia Will add doxepin 10 mg  Sleep ritual  - doxepin (SINEQUAN) 10 MG capsule; Take 1 capsule (10 mg total) by mouth at bedtime.  Dispense: 90 capsule; Refill: 1   Discussed holding Humalog , pt wants rx just in case glucose is elevated Will add doxepin for sleep Continue Myrbetriq  50 mg pt has taken oxybutynin  and detrol  without relief.  Limit caffeine  Continue current medications  Labs  pending Health Maintenance reviewed Diet and exercise encouraged  Follow up plan: 3 months    Bari Learn, FNP

## 2024-04-29 ENCOUNTER — Other Ambulatory Visit

## 2024-04-29 DIAGNOSIS — J4520 Mild intermittent asthma, uncomplicated: Secondary | ICD-10-CM | POA: Diagnosis not present

## 2024-04-29 DIAGNOSIS — I10 Essential (primary) hypertension: Secondary | ICD-10-CM | POA: Diagnosis not present

## 2024-04-29 DIAGNOSIS — F411 Generalized anxiety disorder: Secondary | ICD-10-CM | POA: Diagnosis not present

## 2024-04-29 DIAGNOSIS — E1169 Type 2 diabetes mellitus with other specified complication: Secondary | ICD-10-CM | POA: Diagnosis not present

## 2024-04-29 DIAGNOSIS — Z794 Long term (current) use of insulin: Secondary | ICD-10-CM | POA: Diagnosis not present

## 2024-04-29 DIAGNOSIS — E039 Hypothyroidism, unspecified: Secondary | ICD-10-CM | POA: Diagnosis not present

## 2024-04-30 ENCOUNTER — Telehealth: Payer: Self-pay | Admitting: Family

## 2024-04-30 LAB — CMP14+EGFR
ALT: 10 IU/L (ref 0–32)
AST: 16 IU/L (ref 0–40)
Albumin: 4.4 g/dL (ref 3.8–4.8)
Alkaline Phosphatase: 171 IU/L — ABNORMAL HIGH (ref 49–135)
BUN/Creatinine Ratio: 12 (ref 12–28)
BUN: 18 mg/dL (ref 8–27)
Bilirubin Total: 0.4 mg/dL (ref 0.0–1.2)
CO2: 23 mmol/L (ref 20–29)
Calcium: 10.1 mg/dL (ref 8.7–10.3)
Chloride: 102 mmol/L (ref 96–106)
Creatinine, Ser: 1.53 mg/dL — ABNORMAL HIGH (ref 0.57–1.00)
Globulin, Total: 2.9 g/dL (ref 1.5–4.5)
Glucose: 156 mg/dL — ABNORMAL HIGH (ref 70–99)
Potassium: 5.6 mmol/L — ABNORMAL HIGH (ref 3.5–5.2)
Sodium: 140 mmol/L (ref 134–144)
Total Protein: 7.3 g/dL (ref 6.0–8.5)
eGFR: 36 mL/min/1.73 — ABNORMAL LOW

## 2024-04-30 LAB — CBC WITH DIFFERENTIAL/PLATELET
Basophils Absolute: 0 x10E3/uL (ref 0.0–0.2)
Basos: 0 %
EOS (ABSOLUTE): 0.3 x10E3/uL (ref 0.0–0.4)
Eos: 3 %
Hematocrit: 39.7 % (ref 34.0–46.6)
Hemoglobin: 12.2 g/dL (ref 11.1–15.9)
Immature Grans (Abs): 0 x10E3/uL (ref 0.0–0.1)
Immature Granulocytes: 0 %
Lymphocytes Absolute: 2 x10E3/uL (ref 0.7–3.1)
Lymphs: 22 %
MCH: 25.1 pg — ABNORMAL LOW (ref 26.6–33.0)
MCHC: 30.7 g/dL — ABNORMAL LOW (ref 31.5–35.7)
MCV: 82 fL (ref 79–97)
Monocytes Absolute: 0.6 x10E3/uL (ref 0.1–0.9)
Monocytes: 7 %
Neutrophils Absolute: 6.1 x10E3/uL (ref 1.4–7.0)
Neutrophils: 68 %
Platelets: 270 x10E3/uL (ref 150–450)
RBC: 4.87 x10E6/uL (ref 3.77–5.28)
RDW: 13.2 % (ref 11.7–15.4)
WBC: 9.1 x10E3/uL (ref 3.4–10.8)

## 2024-04-30 LAB — TSH: TSH: 4.55 u[IU]/mL — ABNORMAL HIGH (ref 0.450–4.500)

## 2024-04-30 LAB — VITAMIN B12: Vitamin B-12: 540 pg/mL (ref 232–1245)

## 2024-04-30 NOTE — Telephone Encounter (Signed)
 Called [facility] and spoke with Davina who said they only received Rx for sensors; not the receiver. She took a verbal for the receiver and will work on getting it ran through with insurance. I called and spoke with patient and made her aware of this and advised her to call [facility] once they've had time to run it with insurance to see if its covered. She voiced understanding.

## 2024-04-30 NOTE — Telephone Encounter (Unsigned)
 Copied from CRM [id number]. Topic: Clinical - Medication Question >> Apr 30, 2024  2:03 PM Delon DASEN wrote: Reason for CRM: patient asking if she has to pay to get the dexcom G7 receiver, please   call [phone]- if patient has to pay, cancel order for the Dexcom G7 sensors

## 2024-05-01 ENCOUNTER — Ambulatory Visit: Payer: Self-pay | Admitting: Family

## 2024-05-01 ENCOUNTER — Other Ambulatory Visit: Payer: Self-pay | Admitting: Family Medicine

## 2024-05-01 DIAGNOSIS — N1831 Chronic kidney disease, stage 3a: Secondary | ICD-10-CM

## 2024-05-02 ENCOUNTER — Other Ambulatory Visit

## 2024-05-02 DIAGNOSIS — N1831 Chronic kidney disease, stage 3a: Secondary | ICD-10-CM | POA: Diagnosis not present

## 2024-05-03 LAB — BASIC METABOLIC PANEL WITH GFR
BUN/Creatinine Ratio: 13 (ref 12–28)
BUN: 17 mg/dL (ref 8–27)
CO2: 25 mmol/L (ref 20–29)
Calcium: 9.3 mg/dL (ref 8.7–10.3)
Chloride: 105 mmol/L (ref 96–106)
Creatinine, Ser: 1.34 mg/dL — ABNORMAL HIGH (ref 0.57–1.00)
Glucose: 91 mg/dL (ref 70–99)
Potassium: 4.8 mmol/L (ref 3.5–5.2)
Sodium: 145 mmol/L — ABNORMAL HIGH (ref 134–144)
eGFR: 42 mL/min/1.73 — ABNORMAL LOW (ref >59)

## 2024-05-05 ENCOUNTER — Ambulatory Visit: Payer: Self-pay | Admitting: Family

## 2024-05-05 DIAGNOSIS — K080 Exfoliation of teeth due to systemic causes: Secondary | ICD-10-CM | POA: Diagnosis not present

## 2024-05-06 ENCOUNTER — Other Ambulatory Visit: Payer: Self-pay | Admitting: Family

## 2024-05-06 DIAGNOSIS — E1169 Type 2 diabetes mellitus with other specified complication: Secondary | ICD-10-CM

## 2024-05-13 ENCOUNTER — Ambulatory Visit: Payer: Self-pay | Admitting: Family

## 2024-05-13 ENCOUNTER — Other Ambulatory Visit

## 2024-05-13 DIAGNOSIS — Z794 Long term (current) use of insulin: Secondary | ICD-10-CM

## 2024-05-13 DIAGNOSIS — E1169 Type 2 diabetes mellitus with other specified complication: Secondary | ICD-10-CM | POA: Diagnosis not present

## 2024-05-13 LAB — BAYER DCA HB A1C WAIVED: HB A1C (BAYER DCA - WAIVED): 6.1 % — ABNORMAL HIGH (ref 4.8–5.6)

## 2024-05-27 NOTE — Telephone Encounter (Signed)
 Copied from CRM #[id number]. Topic: Clinical - Lab/Test Results >> May 27, 2024  1:38 PM Tobias L wrote: Reason for CRM: Relayed results to patient. No further questions.

## 2024-06-03 ENCOUNTER — Telehealth: Payer: Self-pay

## 2024-06-03 NOTE — Transitions of Care (Post Inpatient/ED Visit) (Signed)
 "  06/03/2024  Name: [name] MRN: [id number] DOB: 02-Feb-1951  Today's TOC FU Call Status: Today's TOC FU Call Status:: Successful TOC FU Call Completed TOC FU Call Complete Date: 06/03/24  Patient's Name and Date of Birth confirmed. Name, DOB  Transition Care Management Follow-up Telephone Call Date of Discharge: 06/02/24 Discharge Facility: Other [facility]) Name of Other ([facility]) Discharge Facility: [facility] Type of Discharge: Inpatient Admission Primary Inpatient Discharge Diagnosis:: hypothermia How have you been since you were released from the hospital?: Better Any questions or concerns?: No  Items Reviewed: Did you receive and understand the discharge instructions provided?: Yes Medications obtained,verified, and reconciled?: Yes (Medications Reviewed) Any new allergies since your discharge?: No Dietary orders reviewed?: Yes Do you have support at home?: Yes People in Home [RPT]: sibling(s)  Medications Reviewed Today: Medications Reviewed Today     Reviewed by Emmitt Pan, LPN (Licensed Practical Nurse) on 06/03/24 at 1550  Med List Status: <None>   Medication Order Taking? Sig Documenting Provider Last Dose Status Informant  acetaminophen  (TYLENOL ) 500 MG tablet [id number] Yes Take 1 tablet (500 mg total) by mouth every 6 (six) hours as needed. Lavell Lye A, FNP  Active   albuterol  (VENTOLIN  HFA) 108 [phone] Base) MCG/ACT inhaler [id number] Yes INHALE 2 PUFFS INTO THE LUNGS EVERY 6 HOURS AS NEEDED FOR WHEEZE OR SHORTNESS OF BREATH Hawks, Christy A, FNP  Active   alendronate  (FOSAMAX ) 70 MG tablet [id number] Yes Take 1 tablet (70 mg total) by mouth every 7 (seven) days. Take with a full glass of water on an empty stomach. Lavell Lye A, FNP  Active   atorvastatin  (LIPITOR) 20 MG tablet [id number] Yes TAKE 1 TABLET BY MOUTH EVERY DAY Lavell Lye A, FNP  Active   BD INSULIN  SYRINGE ULTRAFINE 31G X 5/16 0.5 ML MISC [id number] Yes USE TO INJECT LANTUS  AND  HUMALOG  DAILY DX E11.65 Lavell Lye A, FNP  Active   budesonide -glycopyrrolate -formoterol  (BREZTRI  AEROSPHERE) 160-9-4.8 MCG/ACT AERO inhaler [id number] Yes Inhale 2 puffs into the lungs 2 (two) times daily. Hawks, Christy A, FNP  Active   calcium  carbonate (OS-CAL - DOSED IN MG OF ELEMENTAL CALCIUM ) 1250 (500 Ca) MG tablet [id number] Yes Take 1 tablet by mouth. [provider]  Active   carvedilol  (COREG ) 3.125 MG tablet [id number] Yes Take 1 tablet (3.125 mg total) by mouth 2 (two) times daily. Lavell Lye A, FNP  Active   cetirizine (ZYRTEC) 10 MG tablet [id number] Yes Take 20 mg by mouth daily. [provider]  Active Self  cloNIDine  (CATAPRES ) 0.1 MG tablet [id number] Yes Take 1 tablet (0.1 mg total) by mouth daily. Lavell Lye LABOR, FNP  Active   Continuous Glucose Receiver (DEXCOM G7 RECEIVER) [place] [id number] Yes 1 Device by Does not apply route daily. Lavell Lye LABOR, FNP  Active   Continuous Glucose Sensor (DEXCOM G7 SENSOR) [place] [id number] Yes 1 Device by Does not apply route daily. Lavell Lye A, FNP  Active   docusate sodium  (COLACE) 100 MG capsule [id number] Yes Take 100 mg by mouth 2 (two) times daily. [provider]  Active Self  doxepin  (SINEQUAN ) 10 MG capsule [id number] Yes Take 1 capsule (10 mg total) by mouth at bedtime. Lavell Lye A, FNP  Active   famotidine  (PEPCID ) 40 MG tablet [id number] Yes Take 1 tablet (40 mg total) by mouth 2 (two) times daily. Lavell Lye A, FNP  Active   fluticasone  (FLONASE ) 50 MCG/ACT nasal spray [id number] Yes Place 1 spray into both nostrils 2 (  two) times daily as needed for allergies or rhinitis. [provider]  Active Self  furosemide  (LASIX ) 40 MG tablet [id number] Yes Take 1 tablet (40 mg total) by mouth daily. Lavell Lye A, FNP  Active   gabapentin  (NEURONTIN ) 800 MG tablet [id number] Yes Take 1 tablet (800 mg total) by mouth 3 (three) times daily. Lavell Lye A, FNP  Active   glucose blood  (ONETOUCH ULTRA TEST) test strip [id number] Yes USE TO TEST BLOOD SUGAR 3 TIMES DAILY AS DIRECTED. DX: E11.65 Lavell Lye A, FNP  Active   hydrOXYzine  (ATARAX ) 25 MG tablet [id number] Yes Take 1 tablet (25 mg total) by mouth 3 (three) times daily. Lavell Lye A, FNP  Active   insulin  glargine (LANTUS ) 100 UNIT/ML injection [id number] Yes INJECT 0.3-0.4 MLS (30-40 UNITS TOTAL) INTO THE SKIN DAILY. Lavell Lye A, FNP  Active   insulin  lispro (HUMALOG ) 100 UNIT/ML injection [id number] Yes INJECT 0.04-0.1 MLS (4-10 UNITS TOTAL) INTO THE SKIN 3 (THREE) TIMES DAILY BEFORE MEALS. Lavell Lye A, FNP  Active   levothyroxine  (SYNTHROID ) 75 MCG tablet [id number] Yes TAKE 1 TABLET BY MOUTH DAILY BEFORE BREAKFAST. Lavell Lye A, FNP  Active   lisinopril  (ZESTRIL ) 40 MG tablet [id number] Yes Take 1 tablet (40 mg total) by mouth daily. Lavell Lye A, FNP  Active   metFORMIN  (GLUCOPHAGE -XR) 750 MG 24 hr tablet [id number] Yes Take 1 tablet (750 mg total) by mouth daily with breakfast. Lavell Lye A, FNP  Active   mirabegron  ER (MYRBETRIQ ) 50 MG TB24 tablet [id number] Yes Take 1 tablet (50 mg total) by mouth daily. Lavell Lye A, FNP  Active   montelukast  (SINGULAIR ) 10 MG tablet [id number] Yes TAKE 1 TABLET BY MOUTH EVERY DAY IN THE EVENING Hawks, Christy A, FNP  Active   Multiple Vitamins-Minerals (MULTIVITAMIN WITH MINERALS) tablet [id number] Yes Take 1 tablet by mouth daily. Centrum Silver [provider]  Active Self  OneTouch UltraSoft 2 Lancets MISC [id number] Yes 1 Lancet by Does not apply route 3 (three) times daily. DX: E11.65 Lavell Lye LABOR, FNP  Active             Home Care and Equipment/Supplies: Were Home Health Services Ordered?: NA Any new equipment or medical supplies ordered?: NA  Functional Questionnaire: Do you need assistance with bathing/showering or dressing?: No Do you need assistance with meal preparation?: No Do you need assistance with eating?: No Do you  have difficulty maintaining continence: No Do you need assistance with getting out of bed/getting out of a chair/moving?: No Do you have difficulty managing or taking your medications?: No  Follow up appointments reviewed: PCP Follow-up appointment confirmed?: Yes Date of PCP follow-up appointment?: 06/09/24 Follow-up Provider: [facility] Follow-up appointment confirmed?: NA Do you need transportation to your follow-up appointment?: No Do you understand care options if your condition(s) worsen?: Yes-patient verbalized understanding    SIGNATURE Julian Lemmings, LPN [facility] Nurse Health Advisor Direct Dial [phone]  "

## 2024-06-09 ENCOUNTER — Encounter: Payer: Self-pay | Admitting: Nurse Practitioner

## 2024-06-09 ENCOUNTER — Ambulatory Visit: Admitting: Nurse Practitioner

## 2024-06-09 VITALS — BP 152/86 | HR 80 | Temp 97.8°F | Ht 64.0 in | Wt 173.0 lb

## 2024-06-09 DIAGNOSIS — E1169 Type 2 diabetes mellitus with other specified complication: Secondary | ICD-10-CM

## 2024-06-09 DIAGNOSIS — Z794 Long term (current) use of insulin: Secondary | ICD-10-CM

## 2024-06-09 DIAGNOSIS — E162 Hypoglycemia, unspecified: Secondary | ICD-10-CM | POA: Diagnosis not present

## 2024-06-09 DIAGNOSIS — G9341 Metabolic encephalopathy: Secondary | ICD-10-CM

## 2024-06-09 DIAGNOSIS — Z789 Other specified health status: Secondary | ICD-10-CM

## 2024-06-09 MED ORDER — INSULIN GLARGINE 100 UNIT/ML ~~LOC~~ SOLN: 30.0000 [IU] | Freq: Every day | SUBCUTANEOUS | 1 refills | Status: AC

## 2024-06-09 NOTE — Progress Notes (Signed)
 "  Subjective:    Patient ID: [name], female    DOB: Aug 09, 1950, 73 y.o.   MRN: [id number]   Chief Complaint: Transitions Of Care   HPI  Today's visit was for Transitional Care Management.  The [facility]24 with a primary diagnosis of hypoglycemia with encephalopathy.   Contact with the patient and/or caregiver, by a clinical staff member, was made on 06/03/24 and was documented as a telephone encounter within the EMR.  Through chart review and discussion with the patient I have determined that management of their condition is of moderate complexity.   Patient was admitted to hospital pn 06/01/24 with confusion. Ended up being dx with encephalopathy due to hypoglycemia. She had not been eating properly and was still taking her insulin . She had blood in her urine so they started her on augmentin. They ruled out sepsis. They told patient to half her lantus  dose and hold her humalog  unless blood sugar gets above 200. Since being home form hospital her blood sugars have been running. Patient says that if blood sugar gets over 140 her vision gets blurry. She needs to be able to drive and she cannot drive if her vision gets blurry.    Patient Active Problem List   Diagnosis Date Noted   Primary insomnia 04/24/2024   Overactive bladder 12/04/2022   Diabetic neuropathy (HCC) 07/06/2022   Vitamin D  deficiency 10/27/2019   Recurrent ventral incisional hernia 04/02/2018   Anemia 01/17/2018   Hypothyroidism 01/14/2018   Nightmares 11/23/2017   Irritable bowel syndrome with constipation 02/27/2017   Generalized anxiety disorder 02/27/2017   Mild intermittent asthma 05/15/2016   Obesity (BMI 30-39.9) 04/13/2016   Generalized OA 04/13/2016   Age-related osteoporosis without current pathological fracture 04/13/2016   Chronic kidney disease 02/09/2015   Avascular necrosis of left femoral head (HCC) 02/02/2015   S/P total hip arthroplasty  02/02/2015   Moderate mitral insufficiency    Hypertension    Diabetes mellitus (HCC)        Review of Systems  Constitutional:  Negative for diaphoresis.  Eyes:  Negative for pain.  Respiratory:  Negative for shortness of breath.   Cardiovascular:  Negative for chest pain, palpitations and leg swelling.  Gastrointestinal:  Negative for abdominal pain.  Endocrine: Negative for polydipsia.  Skin:  Negative for rash.  Neurological:  Negative for dizziness, weakness and headaches.  Hematological:  Does not bruise/bleed easily.  All other systems reviewed and are negative.      Objective:   Physical Exam Constitutional:      Appearance: Normal appearance.  Cardiovascular:     Rate and Rhythm: Normal rate and regular rhythm.     Heart sounds: Normal heart sounds.  Pulmonary:     Effort: Pulmonary effort is normal.     Breath sounds: Normal breath sounds.  Neurological:     General: No focal deficit present.     Mental Status: She is alert and oriented to person, place, and time.  Psychiatric:        Mood and Affect: Mood normal.        Behavior: Behavior normal.   BP (!) 152/86   Pulse 80   Temp 97.8 F (36.6 C) (Temporal)   Ht 5' 4 (1.626 m)   Wt 173 lb (78.5 kg)   SpO2 90%   BMI 29.70 kg/m       Assessment & Plan:  Darleen LITTIE Salmon in today with chief  complaint of Transitions Of Care   1. Transition of care (Primary) Hospital records reviewed  2. Hypoglycemia STOP NOVALOG- unless blood sugar is greater than 200 fasting can use 2u but no more ( patient very anxious about stopping humalog  all together ) Increase lantus  to 30u daily  3. Metabolic encephalopathy Again hold novalog because blood sugar is dropping to low  4. Type 2 diabetes mellitus with other specified complication, with long-term current use of insulin  (HCC) Increase lantus  to 30u qhs - insulin  glargine (LANTUS ) 100 UNIT/ML injection; Inject 0.3 mLs (30 Units total) into the skin at bedtime.  INJECT 0.3-0.4 MLS (30-40 UNITS TOTAL) INTO THE SKIN DAILY.  Dispense: 40 mL; Refill: 1  5. hyperension Keep diary of blood pressure at  home and bring readings to next appointment  The above assessment and management plan was discussed with the patient. The patient verbalized understanding of and has agreed to the management plan. Patient is aware to call the clinic if symptoms persist or worsen. Patient is aware when to return to the clinic for a follow-up visit. Patient educated on when it is appropriate to go to the emergency department.   Mary-Margaret Gladis, FNP    "

## 2024-06-09 NOTE — Patient Instructions (Signed)

## 2024-06-27 ENCOUNTER — Ambulatory Visit (INDEPENDENT_AMBULATORY_CARE_PROVIDER_SITE_OTHER): Admitting: Family

## 2024-06-27 ENCOUNTER — Encounter: Payer: Self-pay | Admitting: Family

## 2024-06-27 VITALS — BP 120/73 | HR 74 | Temp 97.1°F | Ht 64.0 in | Wt 174.4 lb

## 2024-06-27 DIAGNOSIS — E669 Obesity, unspecified: Secondary | ICD-10-CM

## 2024-06-27 DIAGNOSIS — E1169 Type 2 diabetes mellitus with other specified complication: Secondary | ICD-10-CM | POA: Diagnosis not present

## 2024-06-27 DIAGNOSIS — D649 Anemia, unspecified: Secondary | ICD-10-CM

## 2024-06-27 DIAGNOSIS — F411 Generalized anxiety disorder: Secondary | ICD-10-CM | POA: Diagnosis not present

## 2024-06-27 DIAGNOSIS — N1831 Chronic kidney disease, stage 3a: Secondary | ICD-10-CM

## 2024-06-27 DIAGNOSIS — E1142 Type 2 diabetes mellitus with diabetic polyneuropathy: Secondary | ICD-10-CM

## 2024-06-27 DIAGNOSIS — F5101 Primary insomnia: Secondary | ICD-10-CM | POA: Diagnosis not present

## 2024-06-27 DIAGNOSIS — E039 Hypothyroidism, unspecified: Secondary | ICD-10-CM | POA: Diagnosis not present

## 2024-06-27 DIAGNOSIS — I10 Essential (primary) hypertension: Secondary | ICD-10-CM | POA: Diagnosis not present

## 2024-06-27 DIAGNOSIS — Z794 Long term (current) use of insulin: Secondary | ICD-10-CM | POA: Diagnosis not present

## 2024-06-27 DIAGNOSIS — N3281 Overactive bladder: Secondary | ICD-10-CM | POA: Diagnosis not present

## 2024-06-27 MED ORDER — LEVOTHYROXINE SODIUM 75 MCG PO TABS: 75.0000 ug | ORAL_TABLET | Freq: Every day | ORAL | 2 refills | Status: AC

## 2024-06-27 MED ORDER — HYDROXYZINE HCL 25 MG PO TABS: 25.0000 mg | ORAL_TABLET | Freq: Three times a day (TID) | ORAL | 2 refills | Status: AC

## 2024-06-27 MED ORDER — LISINOPRIL 40 MG PO TABS: 40.0000 mg | ORAL_TABLET | Freq: Every day | ORAL | 2 refills | Status: AC

## 2024-06-27 MED ORDER — FUROSEMIDE 40 MG PO TABS: 40.0000 mg | ORAL_TABLET | Freq: Every day | ORAL | 2 refills | Status: AC

## 2024-06-27 NOTE — Patient Instructions (Signed)
 Hypertension, Adult High blood pressure (hypertension) is when the force of blood pumping through the arteries is too strong. The arteries are the blood vessels that carry blood from the heart throughout the body. Hypertension forces the heart to work harder to pump blood and may cause arteries to become narrow or stiff. Untreated or uncontrolled hypertension can lead to a heart attack, heart failure, a stroke, kidney disease, and other problems. A blood pressure reading consists of a higher number over a lower number. Ideally, your blood pressure should be below 120/80. The first ("top") number is called the systolic pressure. It is a measure of the pressure in your arteries as your heart beats. The second ("bottom") number is called the diastolic pressure. It is a measure of the pressure in your arteries as the heart relaxes. What are the causes? The exact cause of this condition is not known. There are some conditions that result in high blood pressure. What increases the risk? Certain factors may make you more likely to develop high blood pressure. Some of these risk factors are under your control, including: Smoking. Not getting enough exercise or physical activity. Being overweight. Having too much fat, sugar, calories, or salt (sodium) in your diet. Drinking too much alcohol. Other risk factors include: Having a personal history of heart disease, diabetes, high cholesterol, or kidney disease. Stress. Having a family history of high blood pressure and high cholesterol. Having obstructive sleep apnea. Age. The risk increases with age. What are the signs or symptoms? High blood pressure may not cause symptoms. Very high blood pressure (hypertensive crisis) may cause: Headache. Fast or irregular heartbeats (palpitations). Shortness of breath. Nosebleed. Nausea and vomiting. Vision changes. Severe chest pain, dizziness, and seizures. How is this diagnosed? This condition is diagnosed by  measuring your blood pressure while you are seated, with your arm resting on a flat surface, your legs uncrossed, and your feet flat on the floor. The cuff of the blood pressure monitor will be placed directly against the skin of your upper arm at the level of your heart. Blood pressure should be measured at least twice using the same arm. Certain conditions can cause a difference in blood pressure between your right and left arms. If you have a high blood pressure reading during one visit or you have normal blood pressure with other risk factors, you may be asked to: Return on a different day to have your blood pressure checked again. Monitor your blood pressure at home for 1 week or longer. If you are diagnosed with hypertension, you may have other blood or imaging tests to help your health care provider understand your overall risk for other conditions. How is this treated? This condition is treated by making healthy lifestyle changes, such as eating healthy foods, exercising more, and reducing your alcohol intake. You may be referred for counseling on a healthy diet and physical activity. Your health care provider may prescribe medicine if lifestyle changes are not enough to get your blood pressure under control and if: Your systolic blood pressure is above 130. Your diastolic blood pressure is above 80. Your personal target blood pressure may vary depending on your medical conditions, your age, and other factors. Follow these instructions at home: Eating and drinking  Eat a diet that is high in fiber and potassium, and low in sodium, added sugar, and fat. An example of this eating plan is called the DASH diet. DASH stands for Dietary Approaches to Stop Hypertension. To eat this way: Eat  plenty of fresh fruits and vegetables. Try to fill one half of your plate at each meal with fruits and vegetables. Eat whole grains, such as whole-wheat pasta, brown rice, or whole-grain bread. Fill about one  fourth of your plate with whole grains. Eat or drink low-fat dairy products, such as skim milk or low-fat yogurt. Avoid fatty cuts of meat, processed or cured meats, and poultry with skin. Fill about one fourth of your plate with lean proteins, such as fish, chicken without skin, beans, eggs, or tofu. Avoid pre-made and processed foods. These tend to be higher in sodium, added sugar, and fat. Reduce your daily sodium intake. Many people with hypertension should eat less than 1,500 mg of sodium a day. Do not drink alcohol if: Your health care provider tells you not to drink. You are pregnant, may be pregnant, or are planning to become pregnant. If you drink alcohol: Limit how much you have to: 0-1 drink a day for women. 0-2 drinks a day for men. Know how much alcohol is in your drink. In the [place] one drink equals one 12 oz bottle of beer (355 mL), one 5 oz glass of wine (148 mL), or one 1 oz glass of hard liquor (44 mL). Lifestyle  Work with your health care provider to maintain a healthy body weight or to lose weight. Ask what an ideal weight is for you. Get at least 30 minutes of exercise that causes your heart to beat faster (aerobic exercise) most days of the week. Activities may include walking, swimming, or biking. Include exercise to strengthen your muscles (resistance exercise), such as Pilates or lifting weights, as part of your weekly exercise routine. Try to do these types of exercises for 30 minutes at least 3 days a week. Do not use any products that contain nicotine or tobacco. These products include cigarettes, chewing tobacco, and vaping devices, such as e-cigarettes. If you need help quitting, ask your health care provider. Monitor your blood pressure at home as told by your health care provider. Keep all follow-up visits. This is important. Medicines Take over-the-counter and prescription medicines only as told by your health care provider. Follow directions carefully. Blood  pressure medicines must be taken as prescribed. Do not skip doses of blood pressure medicine. Doing this puts you at risk for problems and can make the medicine less effective. Ask your health care provider about side effects or reactions to medicines that you should watch for. Contact a health care provider if you: Think you are having a reaction to a medicine you are taking. Have headaches that keep coming back (recurring). Feel dizzy. Have swelling in your ankles. Have trouble with your vision. Get help right away if you: Develop a severe headache or confusion. Have unusual weakness or numbness. Feel faint. Have severe pain in your chest or abdomen. Vomit repeatedly. Have trouble breathing. These symptoms may be an emergency. Get help right away. Call 911. Do not wait to see if the symptoms will go away. Do not drive yourself to the hospital. Summary Hypertension is when the force of blood pumping through your arteries is too strong. If this condition is not controlled, it may put you at risk for serious complications. Your personal target blood pressure may vary depending on your medical conditions, your age, and other factors. For most people, a normal blood pressure is less than 120/80. Hypertension is treated with lifestyle changes, medicines, or a combination of both. Lifestyle changes include losing weight, eating a healthy,  low-sodium diet, exercising more, and limiting alcohol. This information is not intended to replace advice given to you by your health care provider. Make sure you discuss any questions you have with your health care provider. Document Revised: 04/05/2021 Document Reviewed: 04/05/2021 Elsevier Patient Education  2024 [employer].

## 2024-06-27 NOTE — Progress Notes (Signed)
 "  Subjective:    Patient ID: [name], female    DOB: February 09, 1951, 74 y.o.   MRN: [id number]  Chief Complaint  Patient presents with   Hypotension   Hypoglycemia   Pt calls the office today for chronic follow up.   She is followed Cardiologists annually for mitral insufficiency.   She has CKD and avoids NSAID's.   She had osteoporosis and her last dexa scan was 09/15/21. She could not tolerate Fosamax .   She had sepsis with encephalopathy with hypoglycemia and poor oral intake.  She was dc on Augmentin and states her glucose has been stable.  Hypertension This is a chronic problem. The current episode started more than 1 year ago. The problem has been waxing and waning since onset. The problem is uncontrolled. Associated symptoms include anxiety, blurred vision, malaise/fatigue, peripheral edema (my ankles) and shortness of breath. Risk factors for coronary artery disease include diabetes mellitus, dyslipidemia, obesity, sedentary lifestyle and post-menopausal state. Past treatments include beta blockers and ACE inhibitors. The current treatment provides moderate improvement. Identifiable causes of hypertension include a thyroid  problem.  Asthma She complains of shortness of breath and wheezing (some times). This is a chronic problem. The current episode started more than 1 year ago. The problem occurs intermittently. Associated symptoms include heartburn and malaise/fatigue. Her symptoms are alleviated by rest. She reports moderate improvement on treatment. Her past medical history is significant for asthma.  Thyroid  Problem Presents for follow-up visit. Symptoms include anxiety and dry skin. Patient reports no constipation, depressed mood or diarrhea. The symptoms have been stable.  Diabetes She presents for her follow-up diabetic visit. She has type 2 diabetes mellitus. Hypoglycemia symptoms include nervousness/anxiousness. Associated symptoms include blurred vision. Pertinent  negatives for diabetes include no foot paresthesias. Hypoglycemia complications include hospitalization. Symptoms are stable. Pertinent negatives for diabetic complications include no peripheral neuropathy. Risk factors for coronary artery disease include diabetes mellitus, dyslipidemia, sedentary lifestyle, hypertension and obesity. She is following a generally healthy diet. Her overall blood glucose range is 110-130 mg/dl. Eye exam is current.  Anxiety Presents for follow-up visit. Symptoms include excessive worry, insomnia, nervous/anxious behavior and shortness of breath. Patient reports no depressed mood. Symptoms occur occasionally. The severity of symptoms is mild.   Her past medical history is significant for asthma.  Anemia Presents for follow-up visit. Symptoms include malaise/fatigue.  Urinary Frequency  This is a chronic problem. The current episode started more than 1 year ago. The problem occurs intermittently. The patient is experiencing no pain. Associated symptoms include frequency. Treatments tried: myrbetriq  50 mg. The treatment provided mild relief.  Gastroesophageal Reflux She complains of belching, heartburn and wheezing (some times). This is a chronic problem. The current episode started more than 1 year ago. The problem occurs occasionally. The symptoms are aggravated by certain foods. Risk factors include obesity. She has tried a PPI for the symptoms. The treatment provided moderate relief.  Arthritis Presents for follow-up visit. She complains of pain and stiffness. Affected locations include the right hip and left hip. Her pain is at a severity of 10/10 (when walking). Pertinent negatives include no diarrhea.  Insomnia Primary symptoms: sleep disturbance, difficulty falling asleep, frequent awakening, malaise/fatigue.   The current episode started more than one year. The problem occurs intermittently.      Review of Systems  Constitutional:  Positive for  malaise/fatigue.  Eyes:  Positive for blurred vision.  Respiratory:  Positive for shortness of breath and wheezing (some times).   Gastrointestinal:  Positive for heartburn. Negative for constipation and diarrhea.  Genitourinary:  Positive for frequency.  Musculoskeletal:  Positive for stiffness.  Psychiatric/Behavioral:  Positive for sleep disturbance. The patient is nervous/anxious and has insomnia.   All other systems reviewed and are negative.      Family History  Problem Relation Age of Onset   Heart attack Mother    Kidney disease Mother    Diabetes Mother    Hypertension Mother    Colon polyps Mother    Cancer Father        Widespread; unknown primary   COPD Father    Hypertension Sister    Hyperlipidemia Sister    Colon cancer Maternal Aunt    Colon cancer Paternal Aunt    Social History   Socioeconomic History   Marital status: Single    Spouse name: Not on file   Number of children: 0   Years of education: 12   Highest education level: High school graduate  Occupational History   Occupation: retired  Tobacco Use   Smoking status: Never   Smokeless tobacco: Never  Vaping Use   Vaping status: Never Used  Substance and Sexual Activity   Alcohol  use: No    Alcohol /week: 0.0 standard drinks of alcohol    Drug use: No   Sexual activity: Not Currently    Birth control/protection: Surgical  Other Topics Concern   Not on file  Social History Narrative   Lives in single level home with her sister.    Social Drivers of Health   Tobacco Use: Low Risk (06/27/2024)   Patient History    Smoking Tobacco Use: Never    Smokeless Tobacco Use: Never    Passive Exposure: Not on file  Financial Resource Strain: Low Risk (03/11/2024)   Overall Financial Resource Strain (CARDIA)    Difficulty of Paying Living Expenses: Not hard at all  Food Insecurity: No Food Insecurity (06/02/2024)   Received [facility]   Epic    Within the past 12 months, you worried that  your food would run out before you got the money to buy more.: Never true    Within the past 12 months, the food you bought just didn't last and you didn't have money to get more.: Never true  Transportation Needs: No Transportation Needs (06/02/2024)   Received from [facility]   PRAPARE - Transportation    Lack of Transportation (Medical): No    Lack of Transportation (Non-Medical): No  Physical Activity: Inactive (03/11/2024)   Exercise Vital Sign    Days of Exercise per Week: 0 days    Minutes of Exercise per Session: 0 min  Stress: Stress Concern Present (03/11/2024)   [employer] of Occupational [employer] - Occupational Stress Questionnaire    Feeling of Stress: Rather much  Social Connections: Socially Isolated (03/11/2024)   Social Connection and Isolation Panel    Frequency of Communication with Friends and Family: More than three times a week    Frequency of Social Gatherings with Friends and Family: More than three times a week    Attends Religious Services: Never    Database Administrator or Organizations: No    Attends Banker Meetings: Never    Marital Status: Never married  Depression (PHQ2-9): Medium Risk (06/09/2024)   Depression (PHQ2-9)    PHQ-2 Score: 5  Alcohol  Screen: Low Risk (03/11/2024)   Alcohol  Screen    Last Alcohol  Screening Score (AUDIT): 0  Housing: Unknown (03/11/2024)  Epic    Unable to Pay for Housing in the Last Year: No    Number of Times Moved in the Last Year: Not on file    Homeless in the Last Year: No  Utilities: Low Risk (06/02/2024)   Received from [facility]   Utilities    Within the past 12 months, have you been unable to get utilities(heat, electricity) when it was really needed?: No  Health Literacy: Adequate Health Literacy (03/11/2024)   B1300 Health Literacy    Frequency of need for help with medical instructions: Never    Objective:   Physical Exam Vitals reviewed.  Constitutional:      General: She  is not in acute distress.    Appearance: She is well-developed. She is obese.  HENT:     Head: Normocephalic and atraumatic.     Right Ear: Tympanic membrane normal.     Left Ear: Tympanic membrane normal.  Eyes:     Pupils: Pupils are equal, round, and reactive to light.  Neck:     Thyroid : No thyromegaly.  Cardiovascular:     Rate and Rhythm: Normal rate and regular rhythm.     Heart sounds: Normal heart sounds. No murmur heard. Pulmonary:     Effort: Pulmonary effort is normal. No respiratory distress.     Breath sounds: Normal breath sounds. No wheezing.  Abdominal:     General: Bowel sounds are normal. There is no distension.     Palpations: Abdomen is soft.     Tenderness: There is no abdominal tenderness.  Musculoskeletal:        General: No tenderness. Normal range of motion.     Cervical back: Normal range of motion and neck supple.  Skin:    General: Skin is warm and dry.  Neurological:     Mental Status: She is alert and oriented to person, place, and time.     Cranial Nerves: No cranial nerve deficit.     Deep Tendon Reflexes: Reflexes are normal and symmetric.  Psychiatric:        Behavior: Behavior normal.        Thought Content: Thought content normal.        Judgment: Judgment normal.     BP 120/73   Pulse 74   Temp (!) 97.1 F (36.2 C) (Temporal)   Ht 5' 4 (1.626 m)   Wt 174 lb 6.4 oz (79.1 kg)   BMI 29.94 kg/m      Assessment & Plan:  [name] comes in today with chief complaint of Hypotension and Hypoglycemia   Diagnosis and orders addressed:  1. Stage 3a chronic kidney disease (HCC) (Primary) - CMP14+EGFR  2. Diabetic polyneuropathy associated with type 2 diabetes mellitus (HCC) - CMP14+EGFR  3. Generalized anxiety disorder - hydrOXYzine  (ATARAX ) 25 MG tablet; Take 1 tablet (25 mg total) by mouth 3 (three) times daily.  Dispense: 270 tablet; Refill: 2 - CMP14+EGFR  4. Essential hypertension - lisinopril  (ZESTRIL ) 40 MG tablet;  Take 1 tablet (40 mg total) by mouth daily.  Dispense: 90 tablet; Refill: 2 - furosemide  (LASIX ) 40 MG tablet; Take 1 tablet (40 mg total) by mouth daily.  Dispense: 90 tablet; Refill: 2 - CMP14+EGFR  5. Anemia, unspecified type - CMP14+EGFR  6. Type 2 diabetes mellitus with other specified complication, with long-term current use of insulin  (HCC) - CMP14+EGFR  7. Primary hypertension - CMP14+EGFR  8. Hypothyroidism, unspecified type - levothyroxine  (SYNTHROID ) 75 MCG tablet; Take 1 tablet (  75 mcg total) by mouth daily before breakfast.  Dispense: 90 tablet; Refill: 2 - CMP14+EGFR  9. Overactive bladder - CMP14+EGFR  10. Obesity (BMI 30-39.9) - CMP14+EGFR  11. Primary insomnia - CMP14+EGFR   Will stop/hold Humalog  Continue Myrbetriq  50 mg pt has taken oxybutynin  and detrol  without relief.  Limit caffeine  Continue current medications  Labs pending Health Maintenance reviewed- Will get colonoscopy this year Diet and exercise encouraged  Follow up plan: 3 months    Bari Learn, FNP   "

## 2024-06-30 ENCOUNTER — Other Ambulatory Visit

## 2024-07-02 ENCOUNTER — Other Ambulatory Visit

## 2024-07-03 ENCOUNTER — Other Ambulatory Visit

## 2024-07-04 ENCOUNTER — Ambulatory Visit: Payer: Self-pay | Admitting: Family

## 2024-07-04 LAB — CMP14+EGFR
ALT: 17 IU/L (ref 0–32)
AST: 24 IU/L (ref 0–40)
Albumin: 4.5 g/dL (ref 3.8–4.8)
Alkaline Phosphatase: 168 IU/L — ABNORMAL HIGH (ref 49–135)
BUN/Creatinine Ratio: 15 (ref 12–28)
BUN: 23 mg/dL (ref 8–27)
Bilirubin Total: 0.4 mg/dL (ref 0.0–1.2)
CO2: 21 mmol/L (ref 20–29)
Calcium: 10 mg/dL (ref 8.7–10.3)
Chloride: 104 mmol/L (ref 96–106)
Creatinine, Ser: 1.5 mg/dL — ABNORMAL HIGH (ref 0.57–1.00)
Globulin, Total: 3.3 g/dL (ref 1.5–4.5)
Glucose: 106 mg/dL — ABNORMAL HIGH (ref 70–99)
Potassium: 4.8 mmol/L (ref 3.5–5.2)
Sodium: 143 mmol/L (ref 134–144)
Total Protein: 7.8 g/dL (ref 6.0–8.5)
eGFR: 37 mL/min/1.73 — ABNORMAL LOW

## 2024-07-29 ENCOUNTER — Ambulatory Visit: Admitting: Family

## 2024-09-25 ENCOUNTER — Ambulatory Visit: Admitting: Family

## 2024-09-26 ENCOUNTER — Ambulatory Visit: Admitting: Family

## 2025-03-13 ENCOUNTER — Ambulatory Visit: Payer: Self-pay
